# Patient Record
Sex: Male | Born: 1955 | Race: White | Hispanic: No | Marital: Married | Smoking: Former smoker
Health system: Southern US, Community
[De-identification: ages and names within clinical notes are randomized; demographics above are authoritative.]

## PROBLEM LIST (undated history)

## (undated) DIAGNOSIS — R7303 Prediabetes: Secondary | ICD-10-CM

## (undated) DIAGNOSIS — Z789 Other specified health status: Secondary | ICD-10-CM

## (undated) DIAGNOSIS — I2510 Atherosclerotic heart disease of native coronary artery without angina pectoris: Secondary | ICD-10-CM

## (undated) DIAGNOSIS — I499 Cardiac arrhythmia, unspecified: Secondary | ICD-10-CM

## (undated) DIAGNOSIS — Z9861 Coronary angioplasty status: Principal | ICD-10-CM

## (undated) DIAGNOSIS — M1990 Unspecified osteoarthritis, unspecified site: Secondary | ICD-10-CM

## (undated) DIAGNOSIS — R002 Palpitations: Secondary | ICD-10-CM

## (undated) DIAGNOSIS — K219 Gastro-esophageal reflux disease without esophagitis: Secondary | ICD-10-CM

## (undated) DIAGNOSIS — Z720 Tobacco use: Secondary | ICD-10-CM

## (undated) DIAGNOSIS — I2121 ST elevation (STEMI) myocardial infarction involving left circumflex coronary artery: Secondary | ICD-10-CM

## (undated) DIAGNOSIS — IMO0002 Reserved for concepts with insufficient information to code with codable children: Secondary | ICD-10-CM

## (undated) DIAGNOSIS — I10 Essential (primary) hypertension: Secondary | ICD-10-CM

## (undated) DIAGNOSIS — R9430 Abnormal result of cardiovascular function study, unspecified: Secondary | ICD-10-CM

## (undated) DIAGNOSIS — I82409 Acute embolism and thrombosis of unspecified deep veins of unspecified lower extremity: Secondary | ICD-10-CM

## (undated) DIAGNOSIS — I739 Peripheral vascular disease, unspecified: Secondary | ICD-10-CM

## (undated) DIAGNOSIS — I340 Nonrheumatic mitral (valve) insufficiency: Secondary | ICD-10-CM

## (undated) DIAGNOSIS — E785 Hyperlipidemia, unspecified: Secondary | ICD-10-CM

## (undated) DIAGNOSIS — F419 Anxiety disorder, unspecified: Secondary | ICD-10-CM

## (undated) HISTORY — DX: Reserved for concepts with insufficient information to code with codable children: IMO0002

## (undated) HISTORY — DX: Atherosclerotic heart disease of native coronary artery without angina pectoris: I25.10

## (undated) HISTORY — DX: Nonrheumatic mitral (valve) insufficiency: I34.0

## (undated) HISTORY — DX: Acute embolism and thrombosis of unspecified deep veins of unspecified lower extremity: I82.409

## (undated) HISTORY — DX: Gastro-esophageal reflux disease without esophagitis: K21.9

## (undated) HISTORY — DX: Tobacco use: Z72.0

## (undated) HISTORY — PX: GANGLION CYST EXCISION: SHX1691

## (undated) HISTORY — DX: Other specified health status: Z78.9

## (undated) HISTORY — DX: Essential (primary) hypertension: I10

## (undated) HISTORY — DX: Palpitations: R00.2

## (undated) HISTORY — DX: Coronary angioplasty status: Z98.61

## (undated) HISTORY — DX: Abnormal result of cardiovascular function study, unspecified: R94.30

## (undated) HISTORY — PX: HERNIA REPAIR: SHX51

---

## 2005-06-03 ENCOUNTER — Emergency Department (HOSPITAL_COMMUNITY): Admission: EM | Admit: 2005-06-03 | Discharge: 2005-06-03 | Payer: Self-pay | Admitting: Emergency Medicine

## 2005-07-05 ENCOUNTER — Ambulatory Visit: Payer: Self-pay | Admitting: Oncology

## 2005-07-10 ENCOUNTER — Ambulatory Visit (HOSPITAL_COMMUNITY): Admission: RE | Admit: 2005-07-10 | Discharge: 2005-07-10 | Payer: Self-pay | Admitting: Cardiology

## 2005-09-06 ENCOUNTER — Ambulatory Visit: Payer: Self-pay | Admitting: Oncology

## 2008-09-01 ENCOUNTER — Encounter: Payer: Self-pay | Admitting: Cardiology

## 2009-09-21 ENCOUNTER — Encounter: Payer: Self-pay | Admitting: Cardiology

## 2010-05-20 ENCOUNTER — Ambulatory Visit: Payer: Self-pay | Admitting: Psychology

## 2010-06-02 ENCOUNTER — Ambulatory Visit: Payer: Self-pay | Admitting: Psychology

## 2010-08-19 ENCOUNTER — Encounter: Payer: Self-pay | Admitting: Cardiology

## 2010-09-05 LAB — HM COLONOSCOPY

## 2011-04-21 ENCOUNTER — Encounter: Payer: Self-pay | Admitting: Cardiology

## 2011-04-21 ENCOUNTER — Ambulatory Visit (INDEPENDENT_AMBULATORY_CARE_PROVIDER_SITE_OTHER): Payer: BC Managed Care – PPO | Admitting: Cardiology

## 2011-04-21 DIAGNOSIS — K219 Gastro-esophageal reflux disease without esophagitis: Secondary | ICD-10-CM | POA: Insufficient documentation

## 2011-04-21 DIAGNOSIS — Z789 Other specified health status: Secondary | ICD-10-CM | POA: Insufficient documentation

## 2011-04-21 DIAGNOSIS — E785 Hyperlipidemia, unspecified: Secondary | ICD-10-CM | POA: Insufficient documentation

## 2011-04-21 DIAGNOSIS — Z720 Tobacco use: Secondary | ICD-10-CM

## 2011-04-21 DIAGNOSIS — I82409 Acute embolism and thrombosis of unspecified deep veins of unspecified lower extremity: Secondary | ICD-10-CM

## 2011-04-21 DIAGNOSIS — I10 Essential (primary) hypertension: Secondary | ICD-10-CM | POA: Insufficient documentation

## 2011-04-21 DIAGNOSIS — Z87891 Personal history of nicotine dependence: Secondary | ICD-10-CM | POA: Insufficient documentation

## 2011-04-21 DIAGNOSIS — I498 Other specified cardiac arrhythmias: Secondary | ICD-10-CM | POA: Insufficient documentation

## 2011-04-21 DIAGNOSIS — I499 Cardiac arrhythmia, unspecified: Secondary | ICD-10-CM

## 2011-04-21 DIAGNOSIS — Z86718 Personal history of other venous thrombosis and embolism: Secondary | ICD-10-CM | POA: Insufficient documentation

## 2011-04-21 DIAGNOSIS — F17200 Nicotine dependence, unspecified, uncomplicated: Secondary | ICD-10-CM

## 2011-04-21 MED ORDER — OMEGA-3-ACID ETHYL ESTERS 1 G PO CAPS: ORAL_CAPSULE | ORAL | Status: DC

## 2011-04-21 NOTE — Assessment & Plan Note (Signed)
Blood pressure is controlled today. No change in therapy. 

## 2011-04-21 NOTE — Assessment & Plan Note (Signed)
I counseled the patient does have smoking.

## 2011-04-21 NOTE — Progress Notes (Signed)
HPI Patient is seen today at his request to establish cardiology care.  His primary physician is Dr. Wylene Simmer.  He is very happy with his care.  I take care the patient's mother.I have reviewed outside records from care that the patient has received in [facility] in the past.  I do not have records from Dr. Wylene Simmer yet.  The patient has a history of hypertension and hyperlipidemia.  Specifically he has elevated triglycerides according to him.  He has taken prescription fish oil.  He ran out of this recently.  He did poorly with Crestor and TriCor.  He was then tried on atorvastatin and felt poorly with it.  He stopped this several months ago.  I  He has not been having chest pain or shortness of breath.  There is a history of venous thrombosis in his right upper extremity in the remote past.  The exact etiology is not clear.  This happened on 2 occasions according to the patient.  He was treated for Coumadin twice.  Hypercoagulable studies were negative although he did have a positive lupus anticoagulant by history.  He has not had any recent problems with this.  However he feels that the veins in his right arm may be slightly enlarged.  .No Known Allergies  Current Outpatient Prescriptions  Medication Sig Dispense Refill  . esomeprazole (NEXIUM) 10 MG packet Take 10 mg by mouth daily before breakfast.          History   Social History  . Marital Status: Widowed    Spouse Name: N/A    Number of Children: N/A  . Years of Education: N/A   Occupational History  . Not on file.   Social History Main Topics  . Smoking status: Current Everyday Smoker  . Smokeless tobacco: Not on file  . Alcohol Use: Not on file  . Drug Use: Not on file  . Sexually Active: Not on file   Other Topics Concern  . Not on file   Social History Narrative  . No narrative on file    No family history on file.  Past Medical History  Diagnosis Date  . Dyslipidemia     Patient mentions triglycerides  specifically  . GERD (gastroesophageal reflux disease)   . Hypertension   . DVT (deep vein thrombosis) in pregnancy     Right upper arm DVT on 2 occasions in the remote past  . Palpitations     Mild in the past  . Tobacco abuse   . Statin intolerance     Felt poorly after Lipitor, Crestor, TriCor    No past surgical history on file.  ROS  Patient denies fever, chills, headache, sweats, rash, change in vision, change in hearing, chest pain, cough, nausea vomiting, urinary symptoms.  All other systems are reviewed and are negative. PHYSICAL EXAM Patient is oriented to person time and place.  Affect is normal.  Head is atraumatic.  There is no jugular venous distention.  Lungs are clear.  Respiratory effort is Not labored.  Cardiac exam reveals an S1-S2.  No clicks or significant murmurs.  The abdomen is soft.  There is no peripheral edema.  There is no musculoskeletal deformities.  There are no skin rashes.  His right arm musculature is slightly greater than the left.  I'm not sure that there is any evidence of venous insufficiency. Filed Vitals:   04/21/11 1438  BP: 132/80  Pulse: 80  Weight: 225 lb (102.059 kg)    EKG  is done today and reviewed by me.  It is normal.  ASSESSMENT & PLAN

## 2011-04-21 NOTE — Patient Instructions (Addendum)
Your physician recommends that you schedule a follow-up appointment in: 8 WEEKS WITH DR Myrtis Ser  Your physician has recommended you make the following change in your medication: ADD ASPIRIN 81 MG 1 EVERY DAY  LOVAZA 1000 MG  4 TABS DAILY  Your physician recommends that you return for lab work in: FASTING LIPID  BEFORE STARTING LOVAZA  DX 272.4

## 2011-04-21 NOTE — Assessment & Plan Note (Signed)
The patient on a fasting lipid profile.  We will obtain older labs from his primary care physician.  I will restart him on prescription fish oil.  We will then decide how to proceed over time.

## 2011-04-21 NOTE — Assessment & Plan Note (Signed)
There is no evidence at this time deep venous thrombosis.  I'm not aware of any indication to use Coumadin for the Lupus anticoagulant only.  This will be reviewed.I do want him to take 81 mg of aspirin daily as he has risk factors for coronary disease in addition

## 2011-04-25 ENCOUNTER — Other Ambulatory Visit: Payer: Self-pay | Admitting: Cardiology

## 2011-04-25 ENCOUNTER — Other Ambulatory Visit (INDEPENDENT_AMBULATORY_CARE_PROVIDER_SITE_OTHER): Payer: BC Managed Care – PPO | Admitting: *Deleted

## 2011-04-25 DIAGNOSIS — E785 Hyperlipidemia, unspecified: Secondary | ICD-10-CM

## 2011-04-25 LAB — LIPID PANEL
Cholesterol: 218 mg/dL — ABNORMAL HIGH (ref 0–200)
HDL: 36.1 mg/dL — ABNORMAL LOW (ref >39.00)
Total CHOL/HDL Ratio: 6
Triglycerides: 193 mg/dL — ABNORMAL HIGH (ref 0.0–149.0)
VLDL: 38.6 mg/dL (ref 0.0–40.0)

## 2011-04-25 LAB — LDL CHOLESTEROL, DIRECT: Direct LDL: 127.2 mg/dL

## 2011-04-25 MED ORDER — OMEGA-3-ACID ETHYL ESTERS 1 G PO CAPS: ORAL_CAPSULE | ORAL | Status: DC

## 2011-05-09 ENCOUNTER — Telehealth: Payer: Self-pay | Admitting: Cardiology

## 2011-05-09 DIAGNOSIS — E785 Hyperlipidemia, unspecified: Secondary | ICD-10-CM

## 2011-05-09 NOTE — Telephone Encounter (Signed)
Notified of lab results. Made an app for 6 wks. To see Dr. Myrtis Ser 11/5. Will get lab prior to app.

## 2011-05-09 NOTE — Telephone Encounter (Signed)
Pt returning call from [place]. Please return pt call.

## 2011-05-18 ENCOUNTER — Telehealth: Payer: Self-pay | Admitting: Cardiology

## 2011-05-18 NOTE — Telephone Encounter (Signed)
N/A.  LMTC. 

## 2011-05-18 NOTE — Telephone Encounter (Signed)
Pt returned call.  Unable to figure out who called him.

## 2011-05-18 NOTE — Telephone Encounter (Signed)
Pt returning your call

## 2011-05-23 ENCOUNTER — Encounter: Payer: Self-pay | Admitting: Cardiology

## 2011-05-23 DIAGNOSIS — I340 Nonrheumatic mitral (valve) insufficiency: Secondary | ICD-10-CM | POA: Insufficient documentation

## 2011-05-23 DIAGNOSIS — R9430 Abnormal result of cardiovascular function study, unspecified: Secondary | ICD-10-CM | POA: Insufficient documentation

## 2011-06-05 ENCOUNTER — Other Ambulatory Visit (INDEPENDENT_AMBULATORY_CARE_PROVIDER_SITE_OTHER): Payer: BC Managed Care – PPO | Admitting: *Deleted

## 2011-06-05 ENCOUNTER — Other Ambulatory Visit: Payer: Self-pay | Admitting: Cardiology

## 2011-06-05 ENCOUNTER — Encounter: Payer: Self-pay | Admitting: Cardiology

## 2011-06-05 ENCOUNTER — Ambulatory Visit (INDEPENDENT_AMBULATORY_CARE_PROVIDER_SITE_OTHER): Payer: BC Managed Care – PPO | Admitting: Cardiology

## 2011-06-05 DIAGNOSIS — I82409 Acute embolism and thrombosis of unspecified deep veins of unspecified lower extremity: Secondary | ICD-10-CM

## 2011-06-05 DIAGNOSIS — E785 Hyperlipidemia, unspecified: Secondary | ICD-10-CM

## 2011-06-05 LAB — LIPID PANEL
Cholesterol: 234 mg/dL — ABNORMAL HIGH (ref 0–200)
HDL: 41.7 mg/dL (ref >39.00)
Total CHOL/HDL Ratio: 6
Triglycerides: 149 mg/dL (ref 0.0–149.0)
VLDL: 29.8 mg/dL (ref 0.0–40.0)

## 2011-06-05 LAB — BRAIN NATRIURETIC PEPTIDE: Pro B Natriuretic peptide (BNP): 11 pg/mL (ref 0.0–100.0)

## 2011-06-05 LAB — LDL CHOLESTEROL, DIRECT: Direct LDL: 159.1 mg/dL

## 2011-06-05 LAB — HEPATIC FUNCTION PANEL
ALT: 30 U/L (ref 0–53)
AST: 24 U/L (ref 0–37)
Albumin: 4 g/dL (ref 3.5–5.2)
Alkaline Phosphatase: 59 U/L (ref 39–117)
Bilirubin, Direct: 0 mg/dL (ref 0.0–0.3)
Total Bilirubin: 0.7 mg/dL (ref 0.3–1.2)
Total Protein: 7.3 g/dL (ref 6.0–8.3)

## 2011-06-05 NOTE — Assessment & Plan Note (Signed)
This was stable.  No diagnostic workup at this time.

## 2011-06-05 NOTE — Progress Notes (Signed)
HPI Patient is seen today for followup the overall approach to his cardiovascular care.  He establish with me in September, 2012.  I take care of other family members.  He's had problems with statins.  We put him on Lovaza, fish oil pills.  He feels good with this.  He is to have repeat lab today No Known Allergies  Current Outpatient Prescriptions  Medication Sig Dispense Refill  . aspirin 81 MG tablet Take 81 mg by mouth daily.        Marland Kitchen esomeprazole (NEXIUM) 10 MG packet Take 10 mg by mouth daily before breakfast.        . omega-3 acid ethyl esters (LOVAZA) 1 G capsule 4 GRAMS EVERY DAY  360 capsule  3    History   Social History  . Marital Status: Widowed    Spouse Name: N/A    Number of Children: N/A  . Years of Education: N/A   Occupational History  . Not on file.   Social History Main Topics  . Smoking status: Current Everyday Smoker  . Smokeless tobacco: Not on file  . Alcohol Use: Not on file  . Drug Use: Not on file  . Sexually Active: Not on file   Other Topics Concern  . Not on file   Social History Narrative  . No narrative on file    No family history on file.  Past Medical History  Diagnosis Date  . Dyslipidemia     Patient mentions triglycerides specifically  . GERD (gastroesophageal reflux disease)   . Hypertension   . DVT (deep venous thrombosis)     Right upper arm DVT on 2 occasions in the remote past  . Palpitations     Mild in the past  . Tobacco abuse   . Statin intolerance     Felt poorly after Lipitor, Crestor, TriCor  . Ejection fraction     LV function normal, echo, February, 2010  . Mitral regurgitation     Mild, echo, February, 2010    No past surgical history on file.  ROS  Patient denies fever, chills, headache, sweats, rash, change in vision, change in hearing, chest pain, cough, nausea vomiting, urinary symptoms.  All other systems are reviewed and are negative. PHYSICAL EXAM Patient is stable.  Head is atraumatic.  No  jugular venous distention.  He has mild increase in the right upper arm when compared to the left.  This is chronic.  There is no obvious swelling.  Lungs are clear.  Respiratory effort is not labored.  Cardiac exam reveals a somewhat S. Tic.  No clicks or significant murmurs.  Abdomen soft is no peripheral edema. Filed Vitals:   06/05/11 0925  BP: 142/84  Pulse: 79  Resp: 18  Height: 6\' 1"  (1.854 m)  Weight: 227 lb 6.4 oz (103.148 kg)  SpO2: 97%    EKG is not done today. ASSESSMENT & PLAN

## 2011-06-05 NOTE — Assessment & Plan Note (Signed)
We are treating him for primary prevention.  He does not tolerate statins.  I am looking forward to see his triglycerides on his Lovaza.

## 2011-06-05 NOTE — Patient Instructions (Signed)
Your physician wants you to follow-up in:  6 months. You will receive a reminder letter in the mail two months in advance. If you don't receive a letter, please call our office to schedule the follow-up appointment.   

## 2011-06-08 ENCOUNTER — Other Ambulatory Visit: Payer: Self-pay

## 2011-06-08 MED ORDER — PRAVASTATIN SODIUM 40 MG PO TABS: 20.0000 mg | ORAL_TABLET | Freq: Every evening | ORAL | Status: DC

## 2011-12-20 ENCOUNTER — Ambulatory Visit (INDEPENDENT_AMBULATORY_CARE_PROVIDER_SITE_OTHER): Payer: BC Managed Care – PPO | Admitting: Cardiology

## 2011-12-20 ENCOUNTER — Encounter: Payer: Self-pay | Admitting: Cardiology

## 2011-12-20 VITALS — BP 118/76 | HR 74 | Ht 73.0 in | Wt 226.0 lb

## 2011-12-20 DIAGNOSIS — I82409 Acute embolism and thrombosis of unspecified deep veins of unspecified lower extremity: Secondary | ICD-10-CM

## 2011-12-20 DIAGNOSIS — R002 Palpitations: Secondary | ICD-10-CM

## 2011-12-20 DIAGNOSIS — Z720 Tobacco use: Secondary | ICD-10-CM

## 2011-12-20 DIAGNOSIS — E785 Hyperlipidemia, unspecified: Secondary | ICD-10-CM

## 2011-12-20 DIAGNOSIS — Z789 Other specified health status: Secondary | ICD-10-CM

## 2011-12-20 DIAGNOSIS — F17200 Nicotine dependence, unspecified, uncomplicated: Secondary | ICD-10-CM

## 2011-12-20 DIAGNOSIS — I10 Essential (primary) hypertension: Secondary | ICD-10-CM

## 2011-12-20 LAB — LIPID PANEL
Cholesterol: 181 mg/dL (ref 0–200)
HDL: 35.3 mg/dL — ABNORMAL LOW (ref >39.00)
LDL Cholesterol: 110 mg/dL — ABNORMAL HIGH (ref 0–99)
Total CHOL/HDL Ratio: 5
Triglycerides: 180 mg/dL — ABNORMAL HIGH (ref 0.0–149.0)
VLDL: 36 mg/dL (ref 0.0–40.0)

## 2011-12-20 NOTE — Assessment & Plan Note (Signed)
He is tolerating Pravachol 20 at this time. Labs to be done today.

## 2011-12-20 NOTE — Assessment & Plan Note (Signed)
Patient is fasting today. We will obtain lipids. This will reflect him being on Pravachol 20 and off Lovaza.I will see the results and be in touch with him.

## 2011-12-20 NOTE — Assessment & Plan Note (Signed)
Blood pressure is under good control. No change in therapy 

## 2011-12-20 NOTE — Patient Instructions (Signed)
Your physician recommends that you schedule a follow-up appointment in:  6 months with Dr. Myrtis Ser. The office will mail you a reminder letter 2 months prior the appointment date. Your physician recommends that you continue on your current medications as directed. Please refer to the Current Medication list given to you today. Your physician recommends that you return for lab work in:  Today ,fasting Lipid panel.

## 2011-12-20 NOTE — Assessment & Plan Note (Signed)
Patient is counseled to stop smoking. 

## 2011-12-20 NOTE — Assessment & Plan Note (Signed)
He's not having any significant palpitations. 

## 2011-12-20 NOTE — Assessment & Plan Note (Signed)
He has had no recurrent problems in his right arm.

## 2011-12-20 NOTE — Progress Notes (Signed)
   HPI Patient is seen today for followup. He is doing well. I saw him last in November, 2012. At that time his triglycerides were 149 on Lovaza, 4 g daily. I still encouraged him to try some Pravachol. He had not tolerated other statins.  He is on Pravachol 20 and he is tolerating this. However he decided he would like to try the Pravachol and come off the low fossa if possible. Therefore at this time he is on Pravachol 20 4 his lipids. He is tolerating it well.  No Known Allergies  Current Outpatient Prescriptions  Medication Sig Dispense Refill  . aspirin 81 MG tablet Take 81 mg by mouth daily.        . CELEBREX 200 MG capsule as needed.      Marland Kitchen esomeprazole (NEXIUM) 10 MG packet Take 10 mg by mouth daily before breakfast.        . LORazepam (ATIVAN) 0.5 MG tablet as needed.      . pravastatin (PRAVACHOL) 40 MG tablet Take 0.5 tablets (20 mg total) by mouth every evening.  45 tablet  1    History   Social History  . Marital Status: Widowed    Spouse Name: N/A    Number of Children: N/A  . Years of Education: N/A   Occupational History  . Not on file.   Social History Main Topics  . Smoking status: Current Everyday Smoker  . Smokeless tobacco: Not on file  . Alcohol Use: Not on file  . Drug Use: Not on file  . Sexually Active: Not on file   Other Topics Concern  . Not on file   Social History Narrative  . No narrative on file    No family history on file.  Past Medical History  Diagnosis Date  . Dyslipidemia     Patient mentions triglycerides specifically  . GERD (gastroesophageal reflux disease)   . Hypertension   . DVT (deep venous thrombosis)     Right upper arm DVT on 2 occasions in the remote past  . Palpitations     Mild in the past  . Tobacco abuse   . Statin intolerance     Felt poorly after Lipitor, Crestor, TriCor  . Ejection fraction     LV function normal, echo, February, 2010  . Mitral regurgitation     Mild, echo, February, 2010    No past  surgical history on file.  ROS Patient denies fever, chills, headache, sweats, rash, change in vision, change in hearing, chest pain, cough, nausea vomiting, urinary symptoms. All other systems are reviewed and are negative.  PHYSICAL EXAM He is stable. There is no jugular venous distention. Lungs are clear. Respiratory effort is nonlabored. Cardiac exam reveals S1 and S2. There no clicks or significant murmurs. There is no change in the mild increased circumference of his right upper arm. Abdomen is soft. Is no peripheral edema.  Filed Vitals:   12/20/11 0935  BP: 118/76  Pulse: 74  Height: 6\' 1"  (1.854 m)  Weight: 226 lb (102.513 kg)   EKG is done today and reviewed by me. There is normal sinus rhythm. There is no significant abnormality.  ASSESSMENT & PLAN

## 2011-12-26 ENCOUNTER — Encounter: Payer: Self-pay | Admitting: Cardiology

## 2011-12-26 NOTE — Progress Notes (Unsigned)
With the patient off Lovaza and on Pravachol 20 his LDL is down to 110. This is acceptable for him at this time. We will continue this.

## 2012-09-06 ENCOUNTER — Encounter (HOSPITAL_COMMUNITY): Payer: Self-pay | Admitting: Emergency Medicine

## 2012-09-06 ENCOUNTER — Emergency Department (HOSPITAL_COMMUNITY)
Admission: EM | Admit: 2012-09-06 | Discharge: 2012-09-06 | Disposition: A | Discharge status: Home / Self Care | Payer: BC Managed Care – PPO | Attending: Emergency Medicine | Admitting: Emergency Medicine

## 2012-09-06 DIAGNOSIS — K219 Gastro-esophageal reflux disease without esophagitis: Secondary | ICD-10-CM | POA: Insufficient documentation

## 2012-09-06 DIAGNOSIS — Z79899 Other long term (current) drug therapy: Secondary | ICD-10-CM | POA: Insufficient documentation

## 2012-09-06 DIAGNOSIS — Z7982 Long term (current) use of aspirin: Secondary | ICD-10-CM | POA: Insufficient documentation

## 2012-09-06 DIAGNOSIS — K529 Noninfective gastroenteritis and colitis, unspecified: Secondary | ICD-10-CM

## 2012-09-06 DIAGNOSIS — K5289 Other specified noninfective gastroenteritis and colitis: Secondary | ICD-10-CM | POA: Insufficient documentation

## 2012-09-06 DIAGNOSIS — R197 Diarrhea, unspecified: Secondary | ICD-10-CM | POA: Insufficient documentation

## 2012-09-06 DIAGNOSIS — Z86718 Personal history of other venous thrombosis and embolism: Secondary | ICD-10-CM | POA: Insufficient documentation

## 2012-09-06 DIAGNOSIS — I10 Essential (primary) hypertension: Secondary | ICD-10-CM | POA: Insufficient documentation

## 2012-09-06 DIAGNOSIS — Z8679 Personal history of other diseases of the circulatory system: Secondary | ICD-10-CM | POA: Insufficient documentation

## 2012-09-06 DIAGNOSIS — F17200 Nicotine dependence, unspecified, uncomplicated: Secondary | ICD-10-CM | POA: Insufficient documentation

## 2012-09-06 DIAGNOSIS — E785 Hyperlipidemia, unspecified: Secondary | ICD-10-CM | POA: Insufficient documentation

## 2012-09-06 LAB — CBC WITH DIFFERENTIAL/PLATELET
Basophils Absolute: 0 10*3/uL (ref 0.0–0.1)
Basophils Relative: 0 % (ref 0–1)
Eosinophils Absolute: 0.1 10*3/uL (ref 0.0–0.7)
Eosinophils Relative: 1 % (ref 0–5)
HCT: 44.8 % (ref 39.0–52.0)
Hemoglobin: 16 g/dL (ref 13.0–17.0)
Lymphocytes Relative: 3 % — ABNORMAL LOW (ref 12–46)
Lymphs Abs: 0.5 10*3/uL — ABNORMAL LOW (ref 0.7–4.0)
MCH: 32.1 pg (ref 26.0–34.0)
MCHC: 35.7 g/dL (ref 30.0–36.0)
MCV: 89.8 fL (ref 78.0–100.0)
Monocytes Absolute: 0.6 10*3/uL (ref 0.1–1.0)
Monocytes Relative: 5 % (ref 3–12)
Neutro Abs: 13.1 10*3/uL — ABNORMAL HIGH (ref 1.7–7.7)
Neutrophils Relative %: 92 % — ABNORMAL HIGH (ref 43–77)
Platelets: 181 10*3/uL (ref 150–400)
RBC: 4.99 MIL/uL (ref 4.22–5.81)
RDW: 12.9 % (ref 11.5–15.5)
WBC: 14.3 10*3/uL — ABNORMAL HIGH (ref 4.0–10.5)

## 2012-09-06 LAB — COMPREHENSIVE METABOLIC PANEL
ALT: 26 U/L (ref 0–53)
AST: 22 U/L (ref 0–37)
Albumin: 3.7 g/dL (ref 3.5–5.2)
Alkaline Phosphatase: 68 U/L (ref 39–117)
BUN: 21 mg/dL (ref 6–23)
CO2: 23 mEq/L (ref 19–32)
Calcium: 8.3 mg/dL — ABNORMAL LOW (ref 8.4–10.5)
Chloride: 103 mEq/L (ref 96–112)
Creatinine, Ser: 0.95 mg/dL (ref 0.50–1.35)
GFR calc Af Amer: >90 mL/min (ref >90)
GFR calc non Af Amer: >90 mL/min (ref >90)
Glucose, Bld: 155 mg/dL — ABNORMAL HIGH (ref 70–99)
Potassium: 4.1 mEq/L (ref 3.5–5.1)
Sodium: 138 mEq/L (ref 135–145)
Total Bilirubin: 0.7 mg/dL (ref 0.3–1.2)
Total Protein: 7 g/dL (ref 6.0–8.3)

## 2012-09-06 LAB — URINALYSIS, ROUTINE W REFLEX MICROSCOPIC
Bilirubin Urine: NEGATIVE
Glucose, UA: NEGATIVE mg/dL
Hgb urine dipstick: NEGATIVE
Leukocytes, UA: NEGATIVE
Nitrite: NEGATIVE
Protein, ur: NEGATIVE mg/dL
Specific Gravity, Urine: 1.026 (ref 1.005–1.030)
Urobilinogen, UA: 0.2 mg/dL (ref 0.0–1.0)
pH: 5 (ref 5.0–8.0)

## 2012-09-06 LAB — LIPASE, BLOOD: Lipase: 24 U/L (ref 11–59)

## 2012-09-06 MED ORDER — GI COCKTAIL ~~LOC~~
30.0000 mL | Freq: Once | ORAL | Status: AC
  Administered 2012-09-06: 30 mL via ORAL
  Filled 2012-09-06: qty 30

## 2012-09-06 MED ORDER — SODIUM CHLORIDE 0.9 % IV BOLUS (SEPSIS)
1000.0000 mL | Freq: Once | INTRAVENOUS | Status: AC
  Administered 2012-09-06: 1000 mL via INTRAVENOUS

## 2012-09-06 MED ORDER — GI COCKTAIL ~~LOC~~: 30.0000 mL | Freq: Once | ORAL | Status: DC

## 2012-09-06 MED ORDER — ONDANSETRON HCL 4 MG PO TABS: 4.0000 mg | ORAL_TABLET | Freq: Four times a day (QID) | ORAL | Status: DC

## 2012-09-06 MED ORDER — METOCLOPRAMIDE HCL 5 MG/ML IJ SOLN
10.0000 mg | Freq: Once | INTRAMUSCULAR | Status: AC
  Administered 2012-09-06: 10 mg via INTRAVENOUS
  Filled 2012-09-06: qty 2

## 2012-09-06 MED ORDER — ONDANSETRON HCL 4 MG/2ML IJ SOLN
4.0000 mg | Freq: Once | INTRAMUSCULAR | Status: AC
  Administered 2012-09-06: 4 mg via INTRAVENOUS
  Filled 2012-09-06: qty 2

## 2012-09-06 NOTE — ED Notes (Signed)
Pt still getting bolus.

## 2012-09-06 NOTE — ED Notes (Signed)
Report given via EMS. Pt c/o v/d since 1930. Pt unable to keep anything down, felt dehydrated. Positive for orthostatic changes. 18 gauge Left AC. 200 ml given en route. AAOx4. Pt felt dizzy. No chest pain, no SOB. In and out of hospitals visiting family recently. Initial VS 142/78 Pulse 104 RR 14 at 0009. Orthostatic 147/96 HR 109 sitting, 126/91 HR 121 standing. Pt took zofran before EMS arrival, able to keep it down. NKA. Pt taking nexium, simvastatin.

## 2012-09-06 NOTE — ED Notes (Signed)
Pt still too nauseous for PO Tylenol (temp 100.41F) and has been having diarrhea. Unsure if pt will be able to keep in rectal tylenol.

## 2012-09-06 NOTE — ED Notes (Signed)
ION:GE95<MW> Expected date:<BR> Expected time:<BR> Means of arrival:<BR> Comments:<BR> EMS/vomiting and diarrhea since 1930-positive orthostatic changes/IV established

## 2012-09-06 NOTE — ED Notes (Signed)
Pt has been ambulatory and walking to and from the bathroom. Pt has had multiple BMs and has urinated multiple times. Pt reports feeling better at the end. No emesis, but nausea noted. Nausea resolved with the Reglan.

## 2012-09-06 NOTE — ED Provider Notes (Signed)
History     CSN: [id number]  Arrival date & time 09/06/12  0042   First MD Initiated Contact with Patient 09/06/12 0225      Chief Complaint  Patient presents with  . Emesis  . 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 He's concerned that he was becoming dehydrated and called EMS to be brought to the emergency department. He says that he has not been able to keep anything down by mouth. He takes Nexium for his GERD and can feel some of the acid in the back of his throat. While in the ED the patient continues to run to the bathroom and back for episodes of diarrhea. He denies having any abdominal pains but does admit to diffuse cramping right before the episode of diarrhea. EMS noted him to be orthostatic but here in the emergency department his orthostatics are negative patient denies having any pain anywhere or feeling weak. Has not had any fevers. Has not been on any antibiotics recently.  Past Medical History  Diagnosis Date  . Dyslipidemia     Patient mentions triglycerides specifically  . GERD (gastroesophageal reflux disease)   . Hypertension   . DVT (deep venous thrombosis)     Right upper arm DVT on 2 occasions in the remote past  . Palpitations     Mild in the past  . Tobacco abuse   . Statin intolerance     Felt poorly after Lipitor, Crestor, TriCor  . Ejection fraction     LV function normal, echo, February, 2010  . Mitral regurgitation     Mild, echo, February, 2010    History reviewed. No pertinent past surgical history.  No family history on file.  History  Substance Use Topics  . Smoking status: Current Every Day Smoker  . Smokeless tobacco: Not on file  . Alcohol Use: Not on file      Review of Systems  Review of  Systems  Gen: no weight loss, fevers, chills, night sweats  Eyes: no discharge or drainage, no occular pain or visual changes  Nose: no epistaxis or rhinorrhea  Mouth: no dental pain, no sore throat  Neck: no neck pain  Lungs:No wheezing, coughing or hemoptysis CV: no chest pain, palpitations, dependent edema or orthopnea  Abd: no abdominal pain, + nausea, vomiting and diarrhea GU: no dysuria or gross hematuria  MSK:  No abnormalities  Neuro: no headache, no focal neurologic deficits  Skin: no abnormalities Psyche: negative.   Allergies  Review of patient's allergies indicates no known allergies.  Home Medications   Current Outpatient Rx  Name  Route  Sig  Dispense  Refill  . ASPIRIN 81 MG PO TABS   Oral   Take 81 mg by mouth every morning.          Marland Kitchen ESOMEPRAZOLE MAGNESIUM 40 MG PO CPDR   Oral   Take 40 mg by mouth daily before breakfast.         . IBUPROFEN 200 MG PO TABS   Oral   Take 800 mg by mouth every 6 (six) hours as needed. For pain         . ONDANSETRON 8 MG PO TBDP   Oral   Take 8 mg by mouth every 8 (eight) hours as needed. For nausea or vomiting         .  SIMVASTATIN 40 MG PO TABS   Oral   Take 40 mg by mouth every evening.         Marland Kitchen PRAVASTATIN SODIUM 40 MG PO TABS   Oral   Take 0.5 tablets (20 mg total) by mouth every evening.   45 tablet   1     BP 129/80  Pulse 112  Temp 100.6 F (38.1 C) (Oral)  Resp 20  SpO2 99%  Physical Exam  Nursing note and vitals reviewed. Constitutional: He appears well-developed and well-nourished. No distress.  HENT:  Head: Normocephalic and atraumatic.  Eyes: Pupils are equal, round, and reactive to light.  Neck: Normal range of motion. Neck supple.  Cardiovascular: Normal rate and regular rhythm.   Pulmonary/Chest: Effort normal.  Abdominal: Soft. He exhibits no distension and no mass. There is no tenderness. There is no rebound and no guarding.       + hyperactive bowel sounds  Neurological:  He is alert.  Skin: Skin is warm and dry.    ED Course  Procedures (including critical care time)  Labs Reviewed  CBC WITH DIFFERENTIAL - Abnormal; Notable for the following:    WBC 14.3 (*)     Neutrophils Relative 92 (*)     Neutro Abs 13.1 (*)     Lymphocytes Relative 3 (*)     Lymphs Abs 0.5 (*)     All other components within normal limits  COMPREHENSIVE METABOLIC PANEL - Abnormal; Notable for the following:    Glucose, Bld 155 (*)     Calcium 8.3 (*)     All other components within normal limits  URINALYSIS, ROUTINE W REFLEX MICROSCOPIC - Abnormal; Notable for the following:    Ketones, ur TRACE (*)     All other components within normal limits  LIPASE, BLOOD  STOOL CULTURE   No results found.   No diagnosis found.  Dx: gastroenteritis  MDM  Pt given  2 L of fluids in ED, reglan and Gi cocktail.  4:50am- Re-evalauted pt He has not had the urge or a diarrhea episode in an hour and a half. No longer nauseous and no longer having acid reflux symptoms. He admits that he feels much better.  Will give rx for Zofran   Pt informed to drink a lot of fluids. Strict return to ED precautions given. His lab results are not concerning to any renal insuffiencey. He says he feels much better and is comfortable going home.  Pt has been advised of the symptoms that warrant their return to the ED. Patient has voiced understanding and has agreed to follow-up with the PCP or specialist.          Dorthula Matas, PA 09/06/12 [phone]

## 2012-09-06 NOTE — ED Notes (Signed)
Pt ambulated to bathroom 

## 2012-09-06 NOTE — ED Provider Notes (Signed)
Medical screening examination/treatment/procedure(s) were performed by non-physician practitioner and as supervising physician I was immediately available for consultation/collaboration.  John-Adam Qiara Minetti, M.D.     John-Adam Brisha Mccabe, MD 09/06/12 0717 

## 2012-11-18 ENCOUNTER — Encounter: Payer: Self-pay | Admitting: Cardiology

## 2013-07-31 DIAGNOSIS — Z87891 Personal history of nicotine dependence: Secondary | ICD-10-CM

## 2013-07-31 HISTORY — DX: Personal history of nicotine dependence: Z87.891

## 2015-10-28 LAB — IFOBT (OCCULT BLOOD): IFOBT: NEGATIVE

## 2016-04-04 ENCOUNTER — Ambulatory Visit: Payer: Self-pay | Admitting: General Surgery

## 2016-04-04 NOTE — H&P (Signed)
Terry Wright 04/04/2016 2:32 PM Location: [facility] Patient #: [id number] DOB: 1955-10-25 Married / Language: English / Race: White Male  History of Present Illness Odis Hollingshead MD; 04/04/2016 3:12 PM) The patient is a 60 year old male.   Note:He is referred by Dr. Burney Gauze for consultation requiring an intermittently symptomatic umbilical hernia. He was seeing Dr. Burney Gauze because of a growth on the volar aspect of his left wrist. Dr. Burney Gauze plans on removing that. He told Dr. Burney Gauze about the umbilical hernia and he's been sent over here for further evaluation and recommendations for treatment. He used to be a Product manager. He says he has some intermittent pain at times but no obstructive symptoms. No difficulty with urination. No chronic constipation. He is retired. He doesn't do a lot of heavy lifting any more.  Other Problems Marjean Donna, CMA; 04/04/2016 2:32 PM) Anxiety Disorder Gastroesophageal Reflux Disease Hypercholesterolemia Umbilical Hernia Repair  Past Surgical History Marjean Donna, CMA; 04/04/2016 2:32 PM) Appendectomy Colon Polyp Removal - Colonoscopy Colon Polyp Removal - Open Open Inguinal Hernia Surgery Left.  Diagnostic Studies History Marjean Donna, CMA; 04/04/2016 2:32 PM) Colonoscopy 1-5 years ago  Allergies Davy Pique Bynum, [place]; 04/04/2016 2:33 PM) No Known Drug Allergies 04/04/2016  Medication History (Sonya Bynum, CMA; 04/04/2016 2:33 PM) LORazepam (0.5MG  Tablet, Oral) Active. Pravastatin Sodium (40MG  Tablet, Oral) Active. Omeprazole (40MG  Capsule DR, Oral) Active. Medications Reconciled  Social History ([place]; 04/04/2016 2:32 PM) Alcohol use Occasional alcohol use. Caffeine use Carbonated beverages, Coffee. No drug use Tobacco use Former smoker.  Family History Marjean Donna, [address]; 04/04/2016 2:32 PM) Arthritis Father, Mother. Hypertension Mother.     Review of Systems ([place];  04/04/2016 2:32 PM) General Not Present- Appetite Loss, Chills, Fatigue, Fever, Night Sweats, Weight Gain and Weight Loss. Skin Not Present- Change in Wart/Mole, Dryness, Hives, Jaundice, New Lesions, Non-Healing Wounds, Rash and Ulcer. HEENT Not Present- Earache, Hearing Loss, Hoarseness, Nose Bleed, Oral Ulcers, Ringing in the Ears, Seasonal Allergies, Sinus Pain, Sore Throat, Visual Disturbances, Wears glasses/contact lenses and Yellow Eyes. Respiratory Not Present- Bloody sputum, Chronic Cough, Difficulty Breathing, Snoring and Wheezing. Breast Not Present- Breast Mass, Breast Pain, Nipple Discharge and Skin Changes. Cardiovascular Not Present- Chest Pain, Difficulty Breathing Lying Down, Leg Cramps, Palpitations, Rapid Heart Rate, Shortness of Breath and Swelling of Extremities. Gastrointestinal Not Present- Abdominal Pain, Bloating, Bloody Stool, Change in Bowel Habits, Chronic diarrhea, Constipation, Difficulty Swallowing, Excessive gas, Gets full quickly at meals, Hemorrhoids, Indigestion, Nausea, Rectal Pain and Vomiting. Male Genitourinary Not Present- Blood in Urine, Change in Urinary Stream, Frequency, Impotence, Nocturia, Painful Urination, Urgency and Urine Leakage. Musculoskeletal Not Present- Back Pain, Joint Pain, Joint Stiffness, Muscle Pain, Muscle Weakness and Swelling of Extremities. Neurological Not Present- Decreased Memory, Fainting, Headaches, Numbness, Seizures, Tingling, Tremor, Trouble walking and Weakness. Psychiatric Not Present- Anxiety, Bipolar, Change in Sleep Pattern, Depression, Fearful and Frequent crying. Hematology Not Present- Blood Thinners, Easy Bruising, Excessive bleeding, Gland problems, HIV and Persistent Infections.  Vitals (Sonya Bynum CMA; 04/04/2016 2:33 PM) 04/04/2016 2:33 PM Weight: 222 lb Height: 74in Body Surface Area: 2.27 m Body Mass Index: 28.5 kg/m  Temp.: 89F(Temporal)  Pulse: 79 (Regular)  BP: 140/86 (Sitting, Left Arm,  Standard)  Physical Exam Odis Hollingshead MD; 04/04/2016 3:15 PM)  The physical exam findings are as follows: Note:General: WDWN in NAD. Pleasant and cooperative.  HEENT: [place]/AT, no external nasal or ear masses, mucous membranes are moist  EYES: no scleral icterus  CV: RRR,  no murmur, no edema  CHEST: Breath sounds equal and clear. Respirations nonlabored.  ABDOMEN: Soft, RLQ scar, tender reducible umbilical bulge  GU: Left groin scar  MUSCULOSKELETAL: soft tissue mass volar aspect of left wrist  NEUROLOGIC: Alert and oriented, answers questions appropriately.  PSYCHIATRIC: Normal mood, affect , and behavior.    Assessment & Plan Odis Hollingshead MD; [phone] [id number] PM)  UMBILICAL HERNIA WITHOUT OBSTRUCTION OR GANGRENE (K42.9) Impression: This is intermittently symptomatic. It is reducible.  Plan: Umbilical hernia repair mesh. We will coordinate this with left hand surgery by Dr. Burney Gauze. I have discussed the procedure, risks, and aftercare. Risks include but are not limited to bleeding, infection, wound healing problems, anesthesia, recurrence, injury to intra-abdominal organs, cosmetic deformity. He seems to understand and would like to proceed.  Jackolyn Confer, MD

## 2016-07-31 DIAGNOSIS — J449 Chronic obstructive pulmonary disease, unspecified: Secondary | ICD-10-CM

## 2016-07-31 DIAGNOSIS — I209 Angina pectoris, unspecified: Secondary | ICD-10-CM

## 2016-07-31 DIAGNOSIS — I2510 Atherosclerotic heart disease of native coronary artery without angina pectoris: Secondary | ICD-10-CM

## 2016-07-31 HISTORY — DX: Atherosclerotic heart disease of native coronary artery without angina pectoris: I25.10

## 2016-07-31 HISTORY — DX: Chronic obstructive pulmonary disease, unspecified: J44.9

## 2016-07-31 HISTORY — DX: Angina pectoris, unspecified: I20.9

## 2016-08-03 DIAGNOSIS — K219 Gastro-esophageal reflux disease without esophagitis: Secondary | ICD-10-CM | POA: Diagnosis not present

## 2016-08-04 ENCOUNTER — Encounter (HOSPITAL_COMMUNITY)
Admission: EM | Disposition: A | Discharge status: Home / Self Care | Payer: Self-pay | Source: Home / Self Care | Attending: Cardiology

## 2016-08-04 ENCOUNTER — Inpatient Hospital Stay (HOSPITAL_COMMUNITY)
Admission: EM | Admit: 2016-08-04 | Discharge: 2016-08-06 | DRG: 246 | Disposition: A | Discharge status: Home / Self Care | Payer: Commercial Managed Care - HMO | Attending: Cardiology | Admitting: Cardiology

## 2016-08-04 ENCOUNTER — Encounter (HOSPITAL_COMMUNITY): Payer: Self-pay

## 2016-08-04 DIAGNOSIS — I2121 ST elevation (STEMI) myocardial infarction involving left circumflex coronary artery: Secondary | ICD-10-CM | POA: Insufficient documentation

## 2016-08-04 DIAGNOSIS — I340 Nonrheumatic mitral (valve) insufficiency: Secondary | ICD-10-CM | POA: Diagnosis present

## 2016-08-04 DIAGNOSIS — I493 Ventricular premature depolarization: Secondary | ICD-10-CM | POA: Diagnosis not present

## 2016-08-04 DIAGNOSIS — I472 Ventricular tachycardia: Secondary | ICD-10-CM | POA: Diagnosis not present

## 2016-08-04 DIAGNOSIS — Z86718 Personal history of other venous thrombosis and embolism: Secondary | ICD-10-CM | POA: Diagnosis not present

## 2016-08-04 DIAGNOSIS — I462 Cardiac arrest due to underlying cardiac condition: Secondary | ICD-10-CM | POA: Diagnosis not present

## 2016-08-04 DIAGNOSIS — I10 Essential (primary) hypertension: Secondary | ICD-10-CM | POA: Diagnosis present

## 2016-08-04 DIAGNOSIS — I2129 ST elevation (STEMI) myocardial infarction involving other sites: Principal | ICD-10-CM | POA: Diagnosis present

## 2016-08-04 DIAGNOSIS — E785 Hyperlipidemia, unspecified: Secondary | ICD-10-CM | POA: Diagnosis present

## 2016-08-04 DIAGNOSIS — I2510 Atherosclerotic heart disease of native coronary artery without angina pectoris: Secondary | ICD-10-CM

## 2016-08-04 DIAGNOSIS — I4901 Ventricular fibrillation: Secondary | ICD-10-CM | POA: Diagnosis not present

## 2016-08-04 DIAGNOSIS — I469 Cardiac arrest, cause unspecified: Secondary | ICD-10-CM | POA: Diagnosis not present

## 2016-08-04 DIAGNOSIS — K219 Gastro-esophageal reflux disease without esophagitis: Secondary | ICD-10-CM | POA: Diagnosis present

## 2016-08-04 DIAGNOSIS — I499 Cardiac arrhythmia, unspecified: Secondary | ICD-10-CM

## 2016-08-04 DIAGNOSIS — I252 Old myocardial infarction: Secondary | ICD-10-CM

## 2016-08-04 DIAGNOSIS — R079 Chest pain, unspecified: Secondary | ICD-10-CM | POA: Diagnosis not present

## 2016-08-04 DIAGNOSIS — I498 Other specified cardiac arrhythmias: Secondary | ICD-10-CM | POA: Diagnosis present

## 2016-08-04 DIAGNOSIS — I229 Subsequent ST elevation (STEMI) myocardial infarction of unspecified site: Secondary | ICD-10-CM | POA: Diagnosis not present

## 2016-08-04 DIAGNOSIS — I468 Cardiac arrest due to other underlying condition: Secondary | ICD-10-CM | POA: Diagnosis not present

## 2016-08-04 DIAGNOSIS — Z87891 Personal history of nicotine dependence: Secondary | ICD-10-CM | POA: Diagnosis present

## 2016-08-04 DIAGNOSIS — Z9861 Coronary angioplasty status: Secondary | ICD-10-CM

## 2016-08-04 DIAGNOSIS — I213 ST elevation (STEMI) myocardial infarction of unspecified site: Secondary | ICD-10-CM

## 2016-08-04 HISTORY — DX: ST elevation (STEMI) myocardial infarction involving left circumflex coronary artery: I21.21

## 2016-08-04 HISTORY — DX: Atherosclerotic heart disease of native coronary artery without angina pectoris: Z98.61

## 2016-08-04 HISTORY — PX: CARDIAC CATHETERIZATION: SHX172

## 2016-08-04 HISTORY — DX: Atherosclerotic heart disease of native coronary artery without angina pectoris: I25.10

## 2016-08-04 LAB — CBC WITH DIFFERENTIAL/PLATELET
Basophils Absolute: 0.1 10*3/uL (ref 0.0–0.1)
Basophils Relative: 1 %
Eosinophils Absolute: 0.2 10*3/uL (ref 0.0–0.7)
Eosinophils Relative: 2 %
HCT: 50.4 % (ref 39.0–52.0)
Hemoglobin: 17.9 g/dL — ABNORMAL HIGH (ref 13.0–17.0)
Lymphocytes Relative: 40 %
Lymphs Abs: 3.9 10*3/uL (ref 0.7–4.0)
MCH: 32.1 pg (ref 26.0–34.0)
MCHC: 35.5 g/dL (ref 30.0–36.0)
MCV: 90.3 fL (ref 78.0–100.0)
Monocytes Absolute: 0.7 10*3/uL (ref 0.1–1.0)
Monocytes Relative: 8 %
Neutro Abs: 5 10*3/uL (ref 1.7–7.7)
Neutrophils Relative %: 51 %
Platelets: 174 10*3/uL (ref 150–400)
RBC: 5.58 MIL/uL (ref 4.22–5.81)
RDW: 13.3 % (ref 11.5–15.5)
WBC: 9.9 10*3/uL (ref 4.0–10.5)

## 2016-08-04 LAB — COMPREHENSIVE METABOLIC PANEL
ALT: 51 U/L (ref 17–63)
AST: 113 U/L — ABNORMAL HIGH (ref 15–41)
Albumin: 3.6 g/dL (ref 3.5–5.0)
Alkaline Phosphatase: 52 U/L (ref 38–126)
Anion gap: 7 (ref 5–15)
BUN: 14 mg/dL (ref 6–20)
CO2: 22 mmol/L (ref 22–32)
Calcium: 8.6 mg/dL — ABNORMAL LOW (ref 8.9–10.3)
Chloride: 105 mmol/L (ref 101–111)
Creatinine, Ser: 0.8 mg/dL (ref 0.61–1.24)
GFR calc Af Amer: >60 mL/min (ref >60)
GFR calc non Af Amer: >60 mL/min (ref >60)
Glucose, Bld: 164 mg/dL — ABNORMAL HIGH (ref 65–99)
Potassium: 3.8 mmol/L (ref 3.5–5.1)
Sodium: 134 mmol/L — ABNORMAL LOW (ref 135–145)
Total Bilirubin: 0.7 mg/dL (ref 0.3–1.2)
Total Protein: 6.2 g/dL — ABNORMAL LOW (ref 6.5–8.1)

## 2016-08-04 LAB — POCT I-STAT, CHEM 8
BUN: 26 mg/dL — ABNORMAL HIGH (ref 6–20)
Calcium, Ion: 1.09 mmol/L — ABNORMAL LOW (ref 1.15–1.40)
Chloride: 103 mmol/L (ref 101–111)
Creatinine, Ser: 1.1 mg/dL (ref 0.61–1.24)
Glucose, Bld: 154 mg/dL — ABNORMAL HIGH (ref 65–99)
HCT: 54 % — ABNORMAL HIGH (ref 39.0–52.0)
Hemoglobin: 18.4 g/dL — ABNORMAL HIGH (ref 13.0–17.0)
Potassium: 4.7 mmol/L (ref 3.5–5.1)
Sodium: 137 mmol/L (ref 135–145)
TCO2: 28 mmol/L (ref 0–100)

## 2016-08-04 LAB — POCT I-STAT TROPONIN I: Troponin i, poc: 0.5 ng/mL (ref 0.00–0.08)

## 2016-08-04 LAB — POCT ACTIVATED CLOTTING TIME: Activated Clotting Time: 511 seconds

## 2016-08-04 LAB — TROPONIN I
Troponin I: 6.84 ng/mL (ref <0.03)
Troponin I: >65 ng/mL (ref <0.03)

## 2016-08-04 LAB — MRSA PCR SCREENING: MRSA by PCR: NEGATIVE

## 2016-08-04 LAB — PROTIME-INR
INR: 0.99
Prothrombin Time: 13.1 seconds (ref 11.4–15.2)

## 2016-08-04 LAB — I-STAT CG4 LACTIC ACID, ED: Lactic Acid, Venous: 2.83 mmol/L (ref 0.5–1.9)

## 2016-08-04 SURGERY — LEFT HEART CATH AND CORONARY ANGIOGRAPHY
Anesthesia: LOCAL

## 2016-08-04 MED ORDER — SODIUM CHLORIDE 0.9 % IV SOLN: 250.0000 mL | INTRAVENOUS | Status: DC | PRN

## 2016-08-04 MED ORDER — VERAPAMIL HCL 2.5 MG/ML IV SOLN
INTRAVENOUS | Status: AC
  Filled 2016-08-04: qty 2

## 2016-08-04 MED ORDER — MIDAZOLAM HCL 2 MG/2ML IJ SOLN
INTRAMUSCULAR | Status: AC
  Filled 2016-08-04: qty 2

## 2016-08-04 MED ORDER — CARVEDILOL 6.25 MG PO TABS
6.2500 mg | ORAL_TABLET | Freq: Two times a day (BID) | ORAL | Status: DC
  Administered 2016-08-05 – 2016-08-06 (×3): 6.25 mg via ORAL
  Filled 2016-08-04 (×3): qty 1

## 2016-08-04 MED ORDER — FENTANYL CITRATE (PF) 100 MCG/2ML IJ SOLN
INTRAMUSCULAR | Status: DC | PRN
  Administered 2016-08-04: 50 ug via INTRAVENOUS

## 2016-08-04 MED ORDER — CARVEDILOL 3.125 MG PO TABS
3.1250 mg | ORAL_TABLET | Freq: Once | ORAL | Status: AC
  Administered 2016-08-04: 3.125 mg via ORAL
  Filled 2016-08-04: qty 1

## 2016-08-04 MED ORDER — IOPAMIDOL (ISOVUE-370) INJECTION 76%
INTRAVENOUS | Status: AC
  Filled 2016-08-04: qty 125

## 2016-08-04 MED ORDER — MIDAZOLAM HCL 2 MG/2ML IJ SOLN
INTRAMUSCULAR | Status: DC | PRN
  Administered 2016-08-04: 2 mg via INTRAVENOUS

## 2016-08-04 MED ORDER — HEPARIN (PORCINE) IN NACL 2-0.9 UNIT/ML-% IJ SOLN
INTRAMUSCULAR | Status: DC | PRN
  Administered 2016-08-04: 1000 mL

## 2016-08-04 MED ORDER — PANTOPRAZOLE SODIUM 40 MG PO TBEC
40.0000 mg | DELAYED_RELEASE_TABLET | Freq: Every day | ORAL | Status: DC
Start: 2016-08-04 — End: 2016-08-06
  Administered 2016-08-04 – 2016-08-06 (×3): 40 mg via ORAL
  Filled 2016-08-04 (×3): qty 1

## 2016-08-04 MED ORDER — ASPIRIN 81 MG PO CHEW
81.0000 mg | CHEWABLE_TABLET | Freq: Every day | ORAL | Status: DC
  Administered 2016-08-05 – 2016-08-06 (×2): 81 mg via ORAL
  Filled 2016-08-04 (×2): qty 1

## 2016-08-04 MED ORDER — HEPARIN (PORCINE) IN NACL 2-0.9 UNIT/ML-% IJ SOLN
INTRAMUSCULAR | Status: AC
  Filled 2016-08-04: qty 1000

## 2016-08-04 MED ORDER — CARVEDILOL 3.125 MG PO TABS
3.1250 mg | ORAL_TABLET | Freq: Two times a day (BID) | ORAL | Status: DC
  Administered 2016-08-04: 3.125 mg via ORAL
  Filled 2016-08-04: qty 1

## 2016-08-04 MED ORDER — BIVALIRUDIN 250 MG IV SOLR
INTRAVENOUS | Status: AC
  Filled 2016-08-04: qty 250

## 2016-08-04 MED ORDER — LIDOCAINE HCL (PF) 1 % IJ SOLN
INTRAMUSCULAR | Status: AC
  Filled 2016-08-04: qty 30

## 2016-08-04 MED ORDER — HYDRALAZINE HCL 20 MG/ML IJ SOLN: 5.0000 mg | INTRAMUSCULAR | Status: AC | PRN

## 2016-08-04 MED ORDER — LORAZEPAM 0.5 MG PO TABS
0.5000 mg | ORAL_TABLET | Freq: Four times a day (QID) | ORAL | Status: DC | PRN
  Administered 2016-08-04 – 2016-08-05 (×3): 0.5 mg via ORAL
  Filled 2016-08-04 (×3): qty 1

## 2016-08-04 MED ORDER — BIVALIRUDIN BOLUS VIA INFUSION - CUPID
INTRAVENOUS | Status: DC | PRN
  Administered 2016-08-04: 74.85 mg via INTRAVENOUS

## 2016-08-04 MED ORDER — LIDOCAINE HCL (PF) 1 % IJ SOLN
INTRAMUSCULAR | Status: DC | PRN
  Administered 2016-08-04: 2 mL

## 2016-08-04 MED ORDER — LABETALOL HCL 5 MG/ML IV SOLN: 10.0000 mg | INTRAVENOUS | Status: AC | PRN

## 2016-08-04 MED ORDER — TICAGRELOR 90 MG PO TABS
90.0000 mg | ORAL_TABLET | Freq: Two times a day (BID) | ORAL | Status: DC
  Administered 2016-08-04 – 2016-08-06 (×4): 90 mg via ORAL
  Filled 2016-08-04 (×4): qty 1

## 2016-08-04 MED ORDER — SODIUM CHLORIDE 0.9% FLUSH: 3.0000 mL | INTRAVENOUS | Status: DC | PRN

## 2016-08-04 MED ORDER — FENTANYL CITRATE (PF) 100 MCG/2ML IJ SOLN
INTRAMUSCULAR | Status: AC
  Filled 2016-08-04: qty 2

## 2016-08-04 MED ORDER — ATORVASTATIN CALCIUM 80 MG PO TABS: 80.0000 mg | ORAL_TABLET | Freq: Every day | ORAL | Status: DC

## 2016-08-04 MED ORDER — PRAVASTATIN SODIUM 40 MG PO TABS
80.0000 mg | ORAL_TABLET | Freq: Every day | ORAL | Status: DC
  Administered 2016-08-04 – 2016-08-05 (×2): 80 mg via ORAL
  Filled 2016-08-04 (×2): qty 2

## 2016-08-04 MED ORDER — LORAZEPAM 2 MG/ML IJ SOLN
0.5000 mg | Freq: Once | INTRAMUSCULAR | Status: AC
  Administered 2016-08-04: 0.5 mg via INTRAVENOUS

## 2016-08-04 MED ORDER — ONDANSETRON HCL 4 MG/2ML IJ SOLN
4.0000 mg | Freq: Four times a day (QID) | INTRAMUSCULAR | Status: DC | PRN
  Administered 2016-08-04 – 2016-08-06 (×2): 4 mg via INTRAVENOUS
  Filled 2016-08-04 (×2): qty 2

## 2016-08-04 MED ORDER — SODIUM CHLORIDE 0.9 % WEIGHT BASED INFUSION
1.0000 mL/kg/h | INTRAVENOUS | Status: AC
  Administered 2016-08-04: 1 mL/kg/h via INTRAVENOUS

## 2016-08-04 MED ORDER — ACETAMINOPHEN 325 MG PO TABS
650.0000 mg | ORAL_TABLET | ORAL | Status: DC | PRN
  Administered 2016-08-04 – 2016-08-05 (×3): 650 mg via ORAL
  Filled 2016-08-04 (×3): qty 2

## 2016-08-04 MED ORDER — TICAGRELOR 90 MG PO TABS
ORAL_TABLET | ORAL | Status: AC
  Filled 2016-08-04: qty 2

## 2016-08-04 MED ORDER — SODIUM CHLORIDE 0.9 % IV SOLN
INTRAVENOUS | Status: DC | PRN
  Administered 2016-08-04: 1.75 mg/kg/h via INTRAVENOUS

## 2016-08-04 MED ORDER — HEPARIN (PORCINE) IN NACL 2-0.9 UNIT/ML-% IJ SOLN
INTRAMUSCULAR | Status: DC | PRN
  Administered 2016-08-04: 10 mL via INTRA_ARTERIAL

## 2016-08-04 MED ORDER — LORAZEPAM 2 MG/ML IJ SOLN
INTRAMUSCULAR | Status: AC
  Filled 2016-08-04: qty 1

## 2016-08-04 MED ORDER — LORAZEPAM 1 MG PO TABS: 1.0000 mg | ORAL_TABLET | Freq: Four times a day (QID) | ORAL | Status: DC | PRN

## 2016-08-04 MED ORDER — SODIUM CHLORIDE 0.9% FLUSH
3.0000 mL | Freq: Two times a day (BID) | INTRAVENOUS | Status: DC
  Administered 2016-08-04 – 2016-08-06 (×3): 3 mL via INTRAVENOUS

## 2016-08-04 MED ORDER — TICAGRELOR 90 MG PO TABS
ORAL_TABLET | ORAL | Status: DC | PRN
  Administered 2016-08-04: 180 mg via ORAL

## 2016-08-04 SURGICAL SUPPLY — 21 items
BALLN EMERGE MR 2.5X12 (BALLOONS) ×2
BALLN ~~LOC~~ EUPHORA RX 4.5X12 (BALLOONS) ×2
BALLOON EMERGE MR 2.5X12 (BALLOONS) IMPLANT
BALLOON ~~LOC~~ EUPHORA RX 4.5X12 (BALLOONS) IMPLANT
CATH INFINITI 5FR ANG PIGTAIL (CATHETERS) ×1 IMPLANT
CATH OPTITORQUE TIG 4.0 5F (CATHETERS) ×1 IMPLANT
CATH VISTA GUIDE 6FR XB3.5 (CATHETERS) ×1 IMPLANT
CATH VISTA GUIDE 6FR XBLAD3.5 (CATHETERS) ×1 IMPLANT
DEVICE RAD COMP TR BAND LRG (VASCULAR PRODUCTS) ×1 IMPLANT
GLIDESHEATH SLEND SS 6F .021 (SHEATH) ×1 IMPLANT
GUIDEWIRE INQWIRE 1.5J.035X260 (WIRE) IMPLANT
HOVERMATT SINGLE USE (MISCELLANEOUS) ×1 IMPLANT
INQWIRE 1.5J .035X260CM (WIRE) ×2
KIT ENCORE 26 ADVANTAGE (KITS) ×1 IMPLANT
KIT HEART LEFT (KITS) ×2 IMPLANT
PACK CARDIAC CATHETERIZATION (CUSTOM PROCEDURE TRAY) ×2 IMPLANT
STENT SYNERGY DES 4X16 (Permanent Stent) ×1 IMPLANT
SYR MEDRAD MARK V 150ML (SYRINGE) ×2 IMPLANT
TRANSDUCER W/STOPCOCK (MISCELLANEOUS) ×2 IMPLANT
TUBING CIL FLEX 10 FLL-RA (TUBING) ×2 IMPLANT
WIRE ASAHI PROWATER 180CM (WIRE) ×2 IMPLANT

## 2016-08-04 NOTE — ED Notes (Addendum)
Pt to cath lab. Report at bedside to lab staff RN.

## 2016-08-04 NOTE — ED Notes (Addendum)
Pt has 15 second episode of vtach with altered loc.No pulse noted. Shock delivered at 150J. Returns to SR and is able to speak. Placed on non rebreather . Wife at bedside.

## 2016-08-04 NOTE — ED Notes (Signed)
ED Provider at bedside. 

## 2016-08-04 NOTE — ED Notes (Signed)
Pt's belongings in a bag and given to pt's wife. Wife given cell phone in hand.

## 2016-08-04 NOTE — H&P (Signed)
Cardiology H&P    Patient ID: SMAUEL HORACEK MRN: [id number], DOB/AGE: [birth year]   Admit date: 08/04/2016 Date of Consult: 08/04/2016  Primary Physician: Haywood Pao, MD   Patient Profile    Terry Wright is a 61 year old male, retired Airline pilot with a past medical history of HLD. He presented today as a STEMI.   History of Present Illness    Terry [place] was at the beach this past weekend and developed intermittent chest pain on Sunday 07/30/16 which he says felt like indigestion. His pain was intermittent all week, but associated with nausea and diaphoresis. He went to his PCP yesterday, 08/03/16 and reported chest pain and indigestion pain. No EKG was done at the PCP's office.   This morning he told his wife that his chest pain was intense and not indigestion and asked her to take him to the ED. Upon arrival to the ED his EKG showed lateral ST depression. He had an episode of Vfib and received one shock at 150J with return to NSR.   He is a former smoker, quit 3 years ago. He does take a statin drug according his wife. He denies history of CVA, or any clotting disorder. His mother had 2 MI's.   Past Medical History   Past Medical History:  Diagnosis Date  . DVT (deep venous thrombosis) (HCC)    Right upper arm DVT on 2 occasions in the remote past  . Dyslipidemia    Patient mentions triglycerides specifically  . Ejection fraction    LV function normal, echo, February, 2010  . GERD (gastroesophageal reflux disease)   . Hypertension   . Mitral regurgitation    Mild, echo, February, 2010  . Palpitations    Mild in the past  . Statin intolerance    Felt poorly after Lipitor, Crestor, TriCor  . Tobacco abuse     Past Surgical History:  Procedure Laterality Date  . HERNIA REPAIR    . MR WRIST LEFT       Allergies  No Known Allergies  Inpatient Medications      Family History    Family History  Problem Relation Age of Onset  . Heart attack Mother     . Hypertension Father     Social History    Social History   Social History  . Marital status: Married    Spouse name: N/A  . Number of children: N/A  . Years of education: N/A   Occupational History  . Not on file.   Social History Main Topics  . Smoking status: Former Research scientist (life sciences)  . Smokeless tobacco: Never Used  . Alcohol use Yes     Comment: occasional  . Drug use: No  . Sexual activity: Not on file   Other Topics Concern  . Not on file   Social History Narrative  . No narrative on file     Review of Systems    General:  No chills, fever, night sweats or weight changes.  Cardiovascular: + chest pain, dyspnea on exertion, edema, orthopnea, palpitations, paroxysmal nocturnal dyspnea. Dermatological: No rash, lesions/masses Respiratory: No cough, dyspnea Urologic: No hematuria, dysuria Abdominal:   No nausea, vomiting, diarrhea, bright red blood per rectum, melena, or hematemesis Neurologic:  No visual changes, wkns, changes in mental status. All other systems reviewed and are otherwise negative except as noted above.  Physical Exam    Blood pressure (!) 164/102, pulse 87, temperature 97.7 F (36.5 C), temperature source Oral, resp.  rate (!) 28, height 6\' 1"  (1.854 m), weight 220 lb (99.8 kg), SpO2 98 %.  General: male in no acute distress.  Psych: Normal affect. Neuro: Alert and oriented X 3. Moves all extremities spontaneously. HEENT: Normal  Neck: Supple without bruits or JVD. Lungs:  Resp regular and unlabored, CTA. Heart: RRR no s3, s4, or murmurs. Abdomen: Soft, non-tender, non-distended, BS + x 4.  Extremities: No clubbing, cyanosis or edema. DP/PT/Radials 2+ and equal bilaterally.  Labs    Troponin [facility] Test)  Recent Labs  08/04/16 0947  TROPIPOC 0.50*     Recent Labs Lab 08/04/16 0949  NA 137  K 4.7  CL 103  BUN 26*  CREATININE 1.10  GLUCOSE 154*     Radiology Studies    No results found.  EKG & Cardiac Imaging     EKG: NSR with lateral ST depression    Assessment & Plan    1. Acute MI: Patient taken urgently to the cath lab. Further MD recommendations to follow.   Signed, Arbutus Leas, NP 08/04/2016, 10:23 AM Pager: [phone]

## 2016-08-04 NOTE — Progress Notes (Signed)
Responded to code stemi page to releave chaplain and continue support. Patient procedure complete and family waiting on doctor to come out for final briefing before patient goes to room.  Wife was very tearful but is supported by friend and family. Provided empathetic listening, presence and emotional support. Chaplain available as needed.   08/04/16 1100  Clinical Encounter Type  Visited With Family;Patient not available;Health care provider  Visit Type Initial;Spiritual support;Pre-op;Post-op  Referral From Chaplain  Spiritual Encounters  Spiritual Needs Emotional  Stress Factors  Family Stress Factors Exhausted;Health changes     08/04/16 1100  Clinical Encounter Type  Visited With Family;Patient not available;Health care provider  Visit Type Initial;Spiritual support;Pre-op;Post-op  Referral From Chaplain  Spiritual Encounters  Spiritual Needs Emotional  Stress Factors  Family Stress Factors Exhausted;Health changes  Jaclynn Major, [place]

## 2016-08-04 NOTE — ED Notes (Addendum)
Pt given 4000 heperin iv and aspirin 300mg  pr.

## 2016-08-04 NOTE — ED Provider Notes (Signed)
[facility] DEPT Provider Note   CSN: [id number] Arrival date & time: 08/04/16  [id number]     History   Chief Complaint Chief Complaint  Patient presents with  . Chest Pain    HPI Terry Wright is a 61 y.o. male.  HPI Patient had chest pain that awakened him from sleep and has been very severe. He reports is radiating to his neck and shoulder. It's been present since approximately 7 AM. Patient denies history of prior MI. Some pain episodes started about 4 days ago. They had been coming and going. Patient was seen by primary care and reflux was suspected. Patient was started on anti-reflux medications without much relief. His wife reports he did have a better night last night but then when he awakened this morning the pain was worse that had been in the preceding days. They deny history of GI bleed or rectal bleeding. Past Medical History:  Diagnosis Date  . DVT (deep venous thrombosis) (HCC)    Right upper arm DVT on 2 occasions in the remote past  . Dyslipidemia    Patient mentions triglycerides specifically  . Ejection fraction    LV function normal, echo, February, 2010  . GERD (gastroesophageal reflux disease)   . Hypertension   . Mitral regurgitation    Mild, echo, February, 2010  . Palpitations    Mild in the past  . Statin intolerance    Felt poorly after Lipitor, Crestor, TriCor  . Tobacco abuse     Patient Active Problem List   Diagnosis Date Noted  . Ejection fraction   . Mitral regurgitation   . DVT (deep venous thrombosis) ([place])   . Dyslipidemia   . GERD (gastroesophageal reflux disease)   . Hypertension   . Palpitations   . Tobacco abuse   . Statin intolerance     Past Surgical History:  Procedure Laterality Date  . HERNIA REPAIR    . MR WRIST LEFT         Home Medications    Prior to Admission medications   Medication Sig Start Date End Date Taking? Authorizing Provider  Alum & Mag Hydroxide-Simeth (GI COCKTAIL) SUSP suspension Take 30  mLs by mouth once. Shake well. 09/06/12   Tiffany Carlota Raspberry, PA-C  aspirin 81 MG tablet Take 81 mg by mouth every morning.     Historical Provider, MD  esomeprazole (NEXIUM) 40 MG capsule Take 40 mg by mouth daily before breakfast.    Historical Provider, MD  ibuprofen (ADVIL,MOTRIN) 200 MG tablet Take 800 mg by mouth every 6 (six) hours as needed. For pain    Historical Provider, MD  ondansetron (ZOFRAN) 4 MG tablet Take 1 tablet (4 mg total) by mouth every 6 (six) hours. 09/06/12   Tiffany Carlota Raspberry, PA-C  ondansetron (ZOFRAN-ODT) 8 MG disintegrating tablet Take 8 mg by mouth every 8 (eight) hours as needed. For nausea or vomiting    Historical Provider, MD  pravastatin (PRAVACHOL) 40 MG tablet Take 0.5 tablets (20 mg total) by mouth every evening. 06/08/11 06/07/12  Carlena Bjornstad, MD  simvastatin (ZOCOR) 40 MG tablet Take 40 mg by mouth every evening.    Historical Provider, MD    Family History No family history on file.  Social History Social History  Substance Use Topics  . Smoking status: Former Research scientist (life sciences)  . Smokeless tobacco: Never Used  . Alcohol use Yes     Comment: occasional     Allergies   Patient has no known allergies.  Review of Systems Review of Systems 10 Systems reviewed and are negative for acute change except as noted in the HPI.   Physical Exam Updated Vital Signs BP (!) 164/102   Pulse 87   Temp 97.7 F (36.5 C) (Oral)   Resp (!) 28   Ht 6\' 1"  (1.854 m)   Wt 220 lb (99.8 kg)   SpO2 100%   BMI 29.03 kg/m   Physical Exam  Constitutional: He is oriented to person, place, and time. He appears well-developed and well-nourished.  As I walk into the room the patient is alert sitting in the stretcher with no respiratory distress good color normal mental status.   HENT:  Head: Normocephalic and atraumatic.  Nose: Nose normal.  Mouth/Throat: Oropharynx is clear and moist.  Eyes: EOM are normal.  Cardiovascular: Normal rate and regular rhythm.   Pulmonary/Chest:  Effort normal.  Abdominal: Soft. He exhibits no distension. There is no tenderness.  Musculoskeletal: He exhibits no edema or tenderness.  Neurological: He is alert and oriented to person, place, and time. He exhibits normal muscle tone. Coordination normal.  Skin: Skin is warm and dry.  Psychiatric: He has a normal mood and affect.     ED Treatments / Results  Labs (all labs ordered are listed, but only abnormal results are displayed) Labs Reviewed  I-STAT CG4 LACTIC ACID, ED - Abnormal; Notable for the following:       Result Value   Lactic Acid, Venous 2.83 (*)    All other components within normal limits    EKG  EKG Interpretation  Date/Time:  Friday August 04 2016 09:26:34 EST Ventricular Rate:  93 PR Interval:    QRS Duration: 107 QT Interval:  345 QTC Calculation: 430 R Axis:   83 Text Interpretation:  Sinus rhythm Borderline right axis deviation Posterior infarct, acute (LCx) ST elevation, consider inferior injury depression suggestive of posterior MI. change from previous Confirmed by Johnney Killian, MD, Jeannie Done [phone]) on 08/04/2016 9:33:55 AM       Radiology No results found.  Procedures Procedures (including critical care time) CRITICAL CARE Performed by: Charlesetta Shanks   Total critical care time: 30 minutes  Critical care time was exclusive of separately billable procedures and treating other patients.  Critical care was necessary to treat or prevent imminent or life-threatening deterioration.  Critical care was time spent personally by me on the following activities: development of treatment plan with patient and/or surrogate as well as nursing, discussions with consultants, evaluation of patient's response to treatment, examination of patient, obtaining history from patient or surrogate, ordering and performing treatments and interventions, ordering and review of laboratory studies, ordering and review of radiographic studies, pulse oximetry and re-evaluation  of patient's condition. Medications Ordered in ED Medications - No data to display   Initial Impression / Assessment and Plan / ED Course  I have reviewed the triage vital signs and the nursing notes.  Pertinent labs & imaging results that were available during my care of the patient were reviewed by me and considered in my medical decision making (see chart for details).  Clinical Course    Code STEMI call.  Final Clinical Impressions(s) / ED Diagnoses   Final diagnoses:  Cardiac arrest with ventricular fibrillation ([place])  Acute ST elevation myocardial infarction (STEMI), unspecified artery ([place])   I entered the room and asked that patient several questions regarding his chest pain and had nurses activate code STEMI. Within several minutes of this, the patient had ventricular fibrillation arrest.  Patient developed sonorous respirations with fixed gaze. ALS resuscitation immediately initiated. Airway supported with jaw thrust maneuver and defibrillation pads applied. With one defibrillation event, patient converted back to sinus rhythm. Pulses were palpable. Patient regained normal mental status. He initially complained of dyspnea which resolved after several minutes the arrest episode on a nonrebreather mask. With reassessment, he reported chest pain did persist but was less than it had been previously. Patient was not showing acute respiratory distress after the event. IV heparin and aspirin initiated. Patient taken directly to Cath Lab. New Prescriptions Current Discharge Medication List       Charlesetta Shanks, MD 08/04/16 1003

## 2016-08-04 NOTE — Progress Notes (Signed)
Responded to administrative staff's direction to A rm for STEMI not yet paged, just as pt's emotionally distraught wife was brought into rm, given husband's possessions. Escorted wife to Cath Lab waiting area. Providied emotional support thru Cath lab nurse's first report, then colleague arrived, escorting in other family/friend, and continued support.   08/04/16 1000  Clinical Encounter Type  Visited With Family  Visit Type Initial;ED  Referral From Nurse  Spiritual Encounters  Spiritual Needs Emotional  Stress Factors  Family Stress Factors Family relationships;Health changes;Loss of control   Gerrit Heck, Chaplain

## 2016-08-04 NOTE — Care Management Note (Signed)
Case Management Note  Patient Details  Name: Terry Wright MRN: [id number] Date of Birth: 1955/12/25  Subjective/Objective:     Adm w mi               Action/Plan: lives w fam, pcp dr Osborne Casco   Expected Discharge Date:                  Expected Discharge Plan:  Home/Self Care  In-House Referral:     Discharge planning Services  CM Consult, Medication Assistance  Post Acute Care Choice:    Choice offered to:     DME Arranged:    DME Agency:     HH Arranged:    HH Agency:     Status of Service:  Completed, signed off  If discussed at [employer] of Stay Meetings, dates discussed:    Additional Comments: gave pt 30day free and copay card for brilinta. States has [employer] ins. No ins listed yet as pt just adm.  Lacretia Leigh, RN 08/04/2016, 2:04 PM

## 2016-08-04 NOTE — ED Triage Notes (Signed)
Pt c/o chest pain since Monday morning, waking him in night. Pt stgates chest pain worseening. Radiates to left arm, nausea and sweating.c/o SHOB.

## 2016-08-04 NOTE — Interval H&P Note (Signed)
History and Physical Interval Note:  08/04/2016 11:04 AM  Terry Wright  has presented today for surgery, with the diagnosis of stemi  The various methods of treatment have been discussed with the patient and family. After consideration of risks, benefits and other options for treatment, the patient has consented to  Procedure(s): Left Heart Cath and Coronary Angiography (N/A) as a surgical intervention .  The patient's history has been reviewed, patient examined, no change in status, stable for surgery.  I have reviewed the patient's chart and labs.  Questions were answered to the patient's satisfaction.     [name]

## 2016-08-04 NOTE — ED Notes (Signed)
Patient's wedding band given to wife at bedside

## 2016-08-05 ENCOUNTER — Inpatient Hospital Stay (HOSPITAL_COMMUNITY): Payer: Commercial Managed Care - HMO

## 2016-08-05 DIAGNOSIS — I2510 Atherosclerotic heart disease of native coronary artery without angina pectoris: Secondary | ICD-10-CM

## 2016-08-05 HISTORY — PX: TRANSTHORACIC ECHOCARDIOGRAM: SHX275

## 2016-08-05 LAB — BASIC METABOLIC PANEL
Anion gap: 9 (ref 5–15)
BUN: 13 mg/dL (ref 6–20)
CO2: 24 mmol/L (ref 22–32)
Calcium: 9.1 mg/dL (ref 8.9–10.3)
Chloride: 105 mmol/L (ref 101–111)
Creatinine, Ser: 0.91 mg/dL (ref 0.61–1.24)
GFR calc Af Amer: >60 mL/min (ref >60)
GFR calc non Af Amer: >60 mL/min (ref >60)
Glucose, Bld: 167 mg/dL — ABNORMAL HIGH (ref 65–99)
Potassium: 3.7 mmol/L (ref 3.5–5.1)
Sodium: 138 mmol/L (ref 135–145)

## 2016-08-05 LAB — CBC
HCT: 45.5 % (ref 39.0–52.0)
Hemoglobin: 15.9 g/dL (ref 13.0–17.0)
MCH: 31.4 pg (ref 26.0–34.0)
MCHC: 34.9 g/dL (ref 30.0–36.0)
MCV: 89.7 fL (ref 78.0–100.0)
Platelets: 202 10*3/uL (ref 150–400)
RBC: 5.07 MIL/uL (ref 4.22–5.81)
RDW: 13.4 % (ref 11.5–15.5)
WBC: 12 10*3/uL — ABNORMAL HIGH (ref 4.0–10.5)

## 2016-08-05 LAB — TROPONIN I
Troponin I: >65 ng/mL (ref <0.03)
Troponin I: 48.82 ng/mL (ref <0.03)

## 2016-08-05 MED ORDER — LISINOPRIL 2.5 MG PO TABS
2.5000 mg | ORAL_TABLET | Freq: Every day | ORAL | Status: DC
  Administered 2016-08-05: 2.5 mg via ORAL
  Filled 2016-08-05: qty 1

## 2016-08-05 NOTE — Progress Notes (Signed)
Patient Name: Terry Wright Date of Encounter: 08/05/2016  Primary Cardiologist: Ellyn Hack (new)  Hospital Problem List     Principal Problem:   ST elevation myocardial infarction (STEMI) of true posterior wall, initial episode of care [facility]) Active Problems:   Dyslipidemia   Cardiac arrest due to underlying cardiac condition [facility])   Cardiac arrest with ventricular fibrillation ([place])     Subjective   Feels well, even when walking around. No angina/pleurisy and no dyspnea. Right radial site healthy. Echo not yet performed. 4-6 beat runs of NSVT yesterday afternoon, PVCs/bigeminy through the night, now rare isolated PVCs. Troponin >65 x 2 assays.  Inpatient Medications    Scheduled Meds: . aspirin  81 mg Oral Daily  . carvedilol  6.25 mg Oral BID WC  . pantoprazole  40 mg Oral Daily  . pravastatin  80 mg Oral q1800  . sodium chloride flush  3 mL Intravenous Q12H  . ticagrelor  90 mg Oral BID   Continuous Infusions:  PRN Meds: sodium chloride, acetaminophen, LORazepam, ondansetron (ZOFRAN) IV, sodium chloride flush   Vital Signs    Vitals:   08/05/16 0424 08/05/16 0625 08/05/16 0800 08/05/16 0810  BP: 112/70 100/72  118/73  Pulse: 69 72 75 70  Resp: 20 20 20 19   Temp: 98.2 F (36.8 C)   98.7 F (37.1 C)  TempSrc: Oral   Oral  SpO2: 94% 95% 95% 95%  Weight:      Height:        Intake/Output Summary (Last 24 hours) at 08/05/16 1028 Last data filed at 08/05/16 0900  Gross per 24 hour  Intake          1198.56 ml  Output             3125 ml  Net         -1926.44 ml   Filed Weights   08/04/16 0930 08/04/16 1145  Weight: 220 lb (99.8 kg) 220 lb 0.3 oz (99.8 kg)    Physical Exam   Comfortable GEN: Well nourished, well developed, in no acute distress.  HEENT: Grossly normal.  Neck: Supple, no JVD, carotid bruits, or masses. Cardiac: RRR, no murmurs, rubs, or gallops. No clubbing, cyanosis, edema.  Radials/DP/PT 2+ and equal bilaterally.  Respiratory:   Respirations regular and unlabored, clear to auscultation bilaterally. GI: Soft, nontender, nondistended, BS + x 4. MS: no deformity or atrophy. Skin: warm and dry, no rash. Neuro:  Strength and sensation are intact. Psych: AAOx3.  Normal affect.  Labs    CBC  Recent Labs  08/04/16 0947 08/04/16 0949 08/05/16 0541  WBC 9.9  --  12.0*  NEUTROABS 5.0  --   --   HGB 17.9* 18.4* 15.9  HCT 50.4 54.0* 45.5  MCV 90.3  --  89.7  PLT 174  --  [id number]   Basic Metabolic Panel  Recent Labs  08/04/16 1046 08/05/16 0541  NA 134* 138  K 3.8 3.7  CL 105 105  CO2 22 24  GLUCOSE 164* 167*  BUN 14 13  CREATININE 0.80 0.91  CALCIUM 8.6* 9.1   Liver Function Tests  Recent Labs  08/04/16 1046  AST 113*  ALT 51  ALKPHOS 52  BILITOT 0.7  PROT 6.2*  ALBUMIN 3.6   No results for input(s): LIPASE, AMYLASE in the last 72 hours. Cardiac Enzymes  Recent Labs  08/04/16 1909 08/05/16 0010 08/05/16 0541  TROPONINI >65.00* >65.00* 48.82*     Telemetry  4-6 beat runs of NSVT yesterday afternoon, PVCs/bigeminy through the night, now rare isolated PVCs. - Personally Reviewed  ECG    NSR, tall R waves in V1-V2, ST depression and TWI in I and aVL, consistent with evolving posterior infarction. - Personally Reviewed  Radiology    No results found.  Cardiac Studies  CATH:  Prox Cx lesion, 100 %stenosed.  A STENT SYNERGY DES 4X16 drug eluting stent was successfully placed. Postdilated to 4.5 mm  Post intervention, there is a 0% residual stenosis.  __________________________________  Colon Flattery 3rd Mrg lesion, 65 %stenosed in a very small caliber vessel. Otherwise minimal CAD and remaining vessels.  The left ventricular ejection fraction is 35-45% by visual estimate - with apparent anterolateral hypokinesis  LV end diastolic pressure is mildly elevated.   Acute posterior STEMI, complicated by VF/VT arrest with the culprit lesion being 100% proximal occlusion of a very large  almost codominant Circumflex.  This was treated with successful DES stenting.  Patient Profile     61 yo man with newly diagnosed CAD presenting as posterior STEMI complicated by VT/VF arrest with immediate defibrillation in ED and very early revascularization. Culprit vessel was a proximal lesion in a very large LCX, LVEF in cath lab 35%.  Assessment & Plan    1. Acute Posterior STEMI: extensive injury, risk of CHF. Waiting for echo. Discussed cardiac rehab, mandatory uninterrupted DAPT, hi dose statin and role of carvedilol/ACEi for CMP. 2. S/P VT/VF arrest: steady improvement in ventricular ectopy. Suspect we will see improvement in LVEF and do not think he will be at very risk for further serious arrhythmia. PVCs/palpitations are a long term issue, preceding the coronary event. 3. HLP: reported problems with statin intolerance in the past. Will try again and if truly intolerant he should start [place].  Signed, Sanda Klein, MD  08/05/2016, 10:28 AM

## 2016-08-05 NOTE — Progress Notes (Signed)
CARDIAC REHAB PHASE I   PRE:  Rate/Rhythm: 84  BP:  Sitting: 118/86     SaO2: 94ra  MODE:  Ambulation: 600 ft   POST:  Rate/Rhythm: 84  BP:  Sitting: 128/82     SaO2: 96ra  11:55am-12:53pm Patient ambulated at a steady pace with no assistance. No complaints. Education completed with wife at bedside. Agreed to Cardiac Rehab at [facility]. Patient back in bed with wife at side awaiting lunch tray.   [place], MS 08/05/2016 12:51 PM

## 2016-08-06 LAB — ECHOCARDIOGRAM COMPLETE
Height: 73 in
Weight: 3520.31 oz

## 2016-08-06 MED ORDER — ASPIRIN 81 MG PO CHEW: 81.0000 mg | CHEWABLE_TABLET | Freq: Every day | ORAL | 11 refills | Status: DC

## 2016-08-06 MED ORDER — CARVEDILOL 6.25 MG PO TABS: 6.2500 mg | ORAL_TABLET | Freq: Two times a day (BID) | ORAL | 11 refills | Status: DC

## 2016-08-06 MED ORDER — ATORVASTATIN CALCIUM 40 MG PO TABS: 40.0000 mg | ORAL_TABLET | Freq: Every day | ORAL | 11 refills | Status: DC

## 2016-08-06 MED ORDER — TICAGRELOR 90 MG PO TABS
90.0000 mg | ORAL_TABLET | Freq: Two times a day (BID) | ORAL | 11 refills | Status: DC
Start: 2016-08-06 — End: 2017-04-04

## 2016-08-06 MED ORDER — PANTOPRAZOLE SODIUM 40 MG PO TBEC: 40.0000 mg | DELAYED_RELEASE_TABLET | Freq: Every day | ORAL | 11 refills | Status: DC

## 2016-08-06 MED ORDER — ATORVASTATIN CALCIUM 40 MG PO TABS
40.0000 mg | ORAL_TABLET | Freq: Every day | ORAL | Status: DC
Start: 2016-08-06 — End: 2016-08-06

## 2016-08-06 MED ORDER — TICAGRELOR 90 MG PO TABS: 90.0000 mg | ORAL_TABLET | Freq: Two times a day (BID) | ORAL | 0 refills | Status: DC

## 2016-08-06 NOTE — Progress Notes (Signed)
Discharge paperwork reviewed with patient and wife. All questions answered. All prescriptions given. All belongs with family.

## 2016-08-06 NOTE — Progress Notes (Signed)
Patient Name: Terry Wright Date of Encounter: 08/06/2016  Primary Cardiologist: Terry Wright)  [facility] Problem List     Principal Problem:   ST elevation myocardial infarction (STEMI) of true posterior wall, initial episode of care Terry Wright) Active Problems:   Dyslipidemia   Cardiac arrest due to underlying cardiac condition Terry Wright)   Cardiac arrest with ventricular fibrillation (Terry Wright)     Subjective   Feels a little weak and nauseous. No angina or dyspnea.  Inpatient Medications    Scheduled Meds: . aspirin  81 mg Oral Daily  . carvedilol  6.25 mg Oral BID WC  . lisinopril  2.5 mg Oral Daily  . pantoprazole  40 mg Oral Daily  . pravastatin  80 mg Oral q1800  . sodium chloride flush  3 mL Intravenous Q12H  . ticagrelor  90 mg Oral BID   Continuous Infusions:  PRN Meds: sodium chloride, acetaminophen, LORazepam, ondansetron (ZOFRAN) IV, sodium chloride flush   Vital Signs    Vitals:   08/06/16 0600 08/06/16 0700 08/06/16 0800 08/06/16 0811  BP:   111/64 111/64  Pulse:      Resp: 20 19 18 20   Temp:    98.4 F (36.9 C)  TempSrc:    Oral  SpO2:    99%  Weight:      Height:        Intake/Output Summary (Last 24 hours) at 08/06/16 0841 Last data filed at 08/06/16 0800  Gross per 24 hour  Intake              720 ml  Output              600 ml  Net              120 ml   Filed Weights   08/04/16 0930 08/04/16 1145  Weight: 220 lb (99.8 kg) 220 lb 0.3 oz (99.8 kg)    Physical Exam   Looks comfortable GEN: Well nourished, well developed, in no acute distress.  HEENT: Grossly normal.  Neck: Supple, no JVD, carotid bruits, or masses. Cardiac: RRR, no murmurs, rubs, or gallops. No clubbing, cyanosis, edema.  Radials/DP/PT 2+ and equal bilaterally.  Respiratory:  Respirations regular and unlabored, clear to auscultation bilaterally. GI: Soft, nontender, nondistended, BS + x 4. MS: no deformity or atrophy. Skin: warm and dry, no rash. Neuro:  Strength  and sensation are intact. Psych: AAOx3.  Normal affect.  Labs    CBC  Recent Labs  08/04/16 0947 08/04/16 0949 08/05/16 0541  WBC 9.9  --  12.0*  NEUTROABS 5.0  --   --   HGB 17.9* 18.4* 15.9  HCT 50.4 54.0* 45.5  MCV 90.3  --  89.7  PLT 174  --  [id number]   Basic Metabolic Panel  Recent Labs  08/04/16 1046 08/05/16 0541  NA 134* 138  K 3.8 3.7  CL 105 105  CO2 22 24  GLUCOSE 164* 167*  BUN 14 13  CREATININE 0.80 0.91  CALCIUM 8.6* 9.1   Liver Function Tests  Recent Labs  08/04/16 1046  AST 113*  ALT 51  ALKPHOS 52  BILITOT 0.7  PROT 6.2*  ALBUMIN 3.6   No results for input(s): LIPASE, AMYLASE in the last 72 hours. Cardiac Enzymes  Recent Labs  08/04/16 1909 08/05/16 0010 08/05/16 0541  TROPONINI >65.00* >65.00* 48.82*     Telemetry    NSR, isolated PVCs - Personally Reviewed  ECG    NSR,  posterior infarction - Personally Reviewed   Cardiac Studies  Echo 08/04/16 - Left ventricle: The cavity size was normal. There was mild   concentric hypertrophy. Systolic function was mildly to   moderately reduced. The estimated ejection fraction was in the   range of 40% to 45%. Severe hypokinesis of the   basal-midinferolateral myocardium; consistent with infarction in   the distribution of the left circumflex coronary artery. Doppler   parameters are consistent with abnormal left ventricular   relaxation (grade 1 diastolic dysfunction).  CATH:  Prox Cx lesion, 100 %stenosed.  A STENT SYNERGY DES 4X16 drug eluting stent was successfully placed. Postdilated to 4.5 mm  Post intervention, there is a 0% residual stenosis.  __________________________________  Colon Flattery 3rd Mrg lesion, 65 %stenosed in a very small caliber vessel. Otherwise minimal CAD and remaining vessels.  The left ventricular ejection fraction is 35-45% by visual estimate - with apparent anterolateral hypokinesis  LV end diastolic pressure is mildly elevated.  Acute posterior STEMI,  complicated by VF/VT arrest with the culprit lesion being 100% proximal occlusion of a very large almost codominant Circumflex. This was treated with successful DES stenting.  Patient Profile     61 yo man with newly diagnosed CAD presenting as posterior STEMI complicated by VT/VF arrest with immediate defibrillation in ED and very early revascularization. Culprit vessel was a proximal lesion in a very large LCX, LVEF in cath lab 35%, improved 40-45% on next day echo.  Assessment & Plan    1. Acute Posterior STEMI: extensive injury,decreased EF, but normal filling pressures on echo. Discussed cardiac rehab, mandatory uninterrupted DAPT, hi dose statin and role of carvedilol/ACEi for CMP. BP relatively low, will not titrate meds any further and will stop lisinopril for now. 2. S/P VT/VF arrest: steady improvement in ventricular ectopy. Suspect we will see improvement in LVEF and do not think he will be at very risk for further serious arrhythmia. PVCs/palpitations are a long term issue, preceding the coronary event. 3. HLP: reported problems with statin intolerance in the past. Will try again and if truly intolerant he should start [place].  Signed, Sanda Klein, MD  08/06/2016, 8:41 AM

## 2016-08-06 NOTE — Discharge Instructions (Signed)
Heart Attack A heart attack (myocardial infarction, MI) causes damage to the heart that cannot be fixed. A heart attack often happens when a blood clot or other blockage cuts blood flow to the heart. When this happens, certain areas of the heart begin to die. This causes the pain you feel during a heart attack. Follow these instructions at home:  Take medicine as told by your doctor. You may need medicine to:  Keep your blood from clotting too easily.  Control your blood pressure.  Lower your cholesterol.  Control abnormal heart rhythms.  Change certain behaviors as told by your doctor. This may include:  Quitting smoking.  Being active.  Eating a heart-healthy diet. Ask your doctor for help with this diet.  Keeping a healthy weight.  Keeping your diabetes under control.  Lessening stress.  Limiting how much alcohol you drink. Do not take these medicines unless your doctor says that you can:  Nonsteroidal anti-inflammatory drugs (NSAIDs). These include:  Ibuprofen.  Naproxen.  Celecoxib.  Vitamin supplements that have vitamin A, vitamin E, or both.  Hormone therapy that contains estrogen with or without progestin. Get help right away if:  You have sudden chest discomfort.  You have sudden discomfort in your:  Arms.  Back.  Neck.  Jaw.  You have shortness of breath at any time.  You have sudden sweating or clammy skin.  You feel sick to your stomach (nauseous) or throw up (vomit).  You suddenly get light-headed or dizzy.  You feel your heart beating fast or skipping beats. These symptoms may be an emergency. Do not wait to see if the symptoms will go away. Get medical help right away. Call your local emergency services (911 in the [place] Do not drive yourself to the hospital.  This information is not intended to replace advice given to you by your health care provider. Make sure you discuss any questions you have with your health care  provider. Document Released: 01/16/2012 Document Revised: 12/23/2015 Document Reviewed: 09/19/2013 Elsevier Interactive Patient Education  2017 [employer]    Heart Disease Prevention Heart disease is a leading cause of death. There are many things you can do to help prevent heart disease. Be physically active Physical activity is good for your heart. It helps control your blood pressure, cholesterol levels, and weight. Try to be physically active every day. Ask your health care provider what activities are best for you. Be a healthy weight Extra weight can strain your heart and affect your blood pressure and cholesterol levels. Lose weight with diet and exercise if recommended by your health care provider. Eat heart-healthy foods Follow a healthy eating plan as recommended by your health care provider or dietitian. Heart-healthy foods include:  High-fiber foods. These include oat bran, oatmeal, and whole-grain breads and cereals.  Fruits and vegetables. Avoid:  Alcohol.  Fried foods.  Foods high in saturated fat. These include meats, butter, whole dairy products, shortening, and coconut or palm oil.  Salty foods. These include canned food, luncheon meat, salty snacks, and fast food. Keep your cholesterol levels under control Cholesterol is a substance that is used for many important functions. When your cholesterol levels are high, cholesterol can stick to the insides of your blood vessels, making them narrow or clog. This can lead to chest pain (angina) and a heart attack. Keep your cholesterol levels under control as recommended by your health care provider. Have your cholesterol checked at least once a year. Target cholesterol levels (in mg/dL) for  most people are:  Total cholesterol below 200.  LDL cholesterol below 100.  HDL cholesterol above 40 in men and above 50 in women.  Triglycerides below 150. Keep your blood pressure under control Having high blood pressure  (hypertension) puts you at risk for stroke and other forms of heart disease. Keep your blood pressure under control as recommended by your health care provider. Ask your health care provider if you need treatment to lower your blood pressure. If you are 39-7 years of age, have your blood pressure checked every 3-5 years. If you are 22 years of age or older, have your blood pressure checked every year. Do not use tobacco products Tobacco smoke can damage your heart and blood vessels. Do not use any tobacco products including cigarettes, chewing tobacco, or electronic cigarettes. If you need help quitting, ask your health care provider. Take medicines as directed Take medicines only as directed by your health care provider. Ask your health care provider whether you should take an aspirin every day. Taking aspirin can help reduce your risk of heart disease and stroke. Where to find more information: To find out more about heart disease, visit the American Heart Association's website at www.americanheart.org This information is not intended to replace advice given to you by your health care provider. Make sure you discuss any questions you have with your health care provider. Document Released: 02/29/2004 Document Revised: 12/15/2015 Document Reviewed: 09/10/2013 Elsevier Interactive Patient Education  2017 [employer].

## 2016-08-06 NOTE — Discharge Summary (Signed)
Discharge Summary    Patient ID: Terry Wright  MRN: [id number], DOB/AGE: Jul 05, 1956 61 y.o.  Admit Date: 08/04/2016 Discharge Date: 08/06/2016  Primary Care Provider: Haywood Pao, MD Primary Cardiologist: Dr. Ellyn Hack, MD [facility])  Discharge Diagnoses    Principal Problem:   ST elevation myocardial infarction (STEMI) of true posterior wall, initial episode of care [facility]) Active Problems:   Cardiac arrest due to underlying cardiac condition [facility])   Cardiac arrest with ventricular fibrillation ([place])   Dyslipidemia   Palpitations   Tobacco abuse   Allergies No Known Allergies   History of Present Illness     61 year old male, retired Air[employer] pilot, with history of recently diagnosed CAD s/p posterior MI as below, prior PVCs/palpitations, HLD, former tobacco abuse quitting in 2015 who presented to [facility] on 08/04/16 with acute posterior ST elevation MI complicated by VF/VT arrest s/p shock x 1 at 150J.   Patient [place] the beach the [place] prior to his admission and developed intermittent chest pain on 07/30/16 that felt like indigestion. Pain was intermittent all week with associated diaphoresis and nausea. Saw PCP on 08/03/16 with reported chest pain and indigestion. No EKG done at that time per report. On the morning of 08/04/16 he noted to his wife his chest pain was intense and not indigestion. She brought him to the ED.   Hospital Course     Consultants: Cardiac rehab  Upon the patient's arrival to [facility] his EKG showed lateral ST depression. He had an episode of Vfib receiving one shock at 150J with return to NSR. He was taken emergently to the cardiac cath lab which showed proximal LCx 100% stenosis s/p Synergy DES 4 x 16 mm post dilated to 4.5 mm with 0% residual stenosis. There was also 65% stenosis of the OM3 (very small caliber vessel), otherwise minimal CAD in remaining vessels. LV EF was estimated at 35-45% with apparent anterolateral hypokinesis. LVEDP was  mildly elevated. Culprit lesion being 100% proximal occlusion of a very large almost codominant LCx. Troponin peaked at > 65. He was noted to have 4-6 beat run of NSVT on rounds on 1/6 with PVCs/bitgeminy that improved as the day went on. Post procedure echo on 08/06/16 showed an EF of 40-45%, severe hypokinesis of the basal-midinferolateral myocardium consistent with infarction in the distribution of the LCx artery. GR1DD. He worked with phase I cardiac rehab without complaints prior to discharge and plans to work with cardiac rehab as an outpatient. In rounds on 1/7 he felt a little weak with nausea. No chest pain or dyspnea. Telemetry showed NSR with isolated PVCs. Upon Dr. Sallyanne Kuster checking in with the patient later in the day on 1/7 he felt like he wanted to go home. He will be discharged on uninterrupted DAPT (ASA 81 mg and Brilinta 90 mg bid), along with Coreg 6.25 mg bid, and Lipitor 40 mg (in place of his prior pravastatin). He was on lisinopril during his admission, though this was discontinued prior to discharge given soft BP with consideration for restarting at hospital follow up. It was felt he may see an improvement in ventricular ectopy as well as improvement in LVEF and that he will not be at risk for further serious arrhythmia. It was noted his PVCs/palpitations were a long term issue dating prior to his MI. He was noted to have prior statin intolerance. We will re-try him on Lipitor. If as an outpatient he cannot tolerate this medication we may need to  consider Repatha vs Praluent.   Labs at day of discharge showed wbc of 12.0 (drawn 1/6), hgb 15.9, plt 202, SCr 0.91, K+ 3.7.  The patient's right radial cath site has been examined is healing well without issues at this time. The patient has been seen by Dr. Sallyanne Kuster and felt to be stable for discharge today. All follow up appointments have been sent to the office with the patient being advised to contact the office if he has not heard from them  in 48 hours. Discharge medications are listed below. He has been advised to hold ibuprofen. Prescriptions have been reviewed with the patient and printed.   [facility] uses [facility] on the corner of [address] and [address]. They are open until 6 PM. He was given printed prescriptions in case he cannot make it to [facility] prior to them closing. If that is the case [facility] will need [facility] [address] as their pharmacy is open 24 hours. He has been instructed to not miss any doses of dual antiplatelet therapy.  _____________  Discharge Vitals Blood pressure 112/79, pulse 70, temperature 97.9 F (36.6 C), temperature source Oral, resp. rate 19, height '6\' 1"'  (1.854 m), weight 220 lb 0.3 oz (99.8 kg), SpO2 99 %.  Filed Weights   08/04/16 0930 08/04/16 1145  Weight: 220 lb (99.8 kg) 220 lb 0.3 oz (99.8 kg)    Labs & Radiologic Studies    CBC  Recent Labs  08/04/16 0947 08/04/16 0949 08/05/16 0541  WBC 9.9  --  12.0*  NEUTROABS 5.0  --   --   HGB 17.9* 18.4* 15.9  HCT 50.4 54.0* 45.5  MCV 90.3  --  89.7  PLT 174  --  599   Basic Metabolic Panel  Recent Labs  08/04/16 1046 08/05/16 0541  NA 134* 138  K 3.8 3.7  CL 105 105  CO2 22 24  GLUCOSE 164* 167*  BUN 14 13  CREATININE 0.80 0.91  CALCIUM 8.6* 9.1   Liver Function Tests  Recent Labs  08/04/16 1046  AST 113*  ALT 51  ALKPHOS 52  BILITOT 0.7  PROT 6.2*  ALBUMIN 3.6   No results for input(s): LIPASE, AMYLASE in the last 72 hours. Cardiac Enzymes  Recent Labs  08/04/16 1909 08/05/16 0010 08/05/16 0541  TROPONINI >65.00* >65.00* 48.82*   BNP Invalid input(s): POCBNP D-Dimer No results for input(s): DDIMER in the last 72 hours. Hemoglobin A1C No results for input(s): HGBA1C in the last 72 hours. Fasting Lipid Panel No results for input(s): CHOL, HDL, LDLCALC, TRIG, CHOLHDL, LDLDIRECT in the last 72 hours. Thyroid Function Tests No results for input(s): TSH, T4TOTAL, T3FREE, THYROIDAB in the last  72 hours.  Invalid input(s): FREET3 _____________  No results found.  Diagnostic Studies/Procedures   Left heart Cath 08/04/2016: Coronary Findings   Dominance: Right  Left Main  Vessel is large.  Left Anterior Descending  Ost LAD to Prox LAD lesion, 20% stenosed. The lesion is tubular and smooth.  First Diagonal Branch  Vessel is moderate in size.  Lateral First Diagonal Branch  Vessel is small in size.  First Septal Branch  Vessel is small in size.  Second Diagonal Branch  Vessel is small in size.  Second Septal Branch  Vessel is small in size.  Third Diagonal Branch  Vessel is small in size.  Third Septal Branch  Vessel is small in size.  Ramus Intermedius  Vessel is small.  Left Circumflex  Prox Cx lesion, 100% stenosed. Culprit lesion. The lesion is type A, located proximal to the major branch, irregular and thrombotic.  Angioplasty: Lesion length: 14 mm. Lesion crossed with guidewire using a WIRE ASAHI PROWATER 180CM. Pre-stent angioplasty was performed using a BALLOON EMERGE MR 2.5X12. Maximum pressure: 12 atm. Inflation time: 20 sec. A STENT SYNERGY DES 4X16 drug eluting stent was successfully placed. Minimum lumen area: 4.6 mm. Stent strut is well apposed. Post-stent angioplasty was performed using a BALLOON [place] EUPHORA [id number]. Maximum pressure: 18 atm. Inflation time: 30 sec. There is no pre-interventional antegrade distal flow (TIMI 0). The post-interventional distal flow is normal (TIMI 3). The intervention was successful . No complications occurred at this lesion. See other findings below. Initial Guide: CATH VISTA GUIDE 6FR XB3.5 ([id number]) With removal of the stent balloon, the size of the balloon caused the balloon to get stuck in the opening of the guide. With pulling further the balloon catheter itself broke. I therefore ensured that the balloon was inside the guide and pulled the guide along with guidewire out completely out of the body ensuring that all pieces of  the balloon catheter were removed. I then inserted a 2nd Guide: CATH VISTA GUIDE 6FR XBLAD3.5 ([id number]), and used a new guidewire for post dilation.  There is no residual stenosis post intervention.  First Obtuse Marginal Branch  Vessel is moderate in size. Vessel is angiographically normal.  Second Obtuse Marginal Branch  Vessel is moderate in size. Vessel is angiographically normal.  Third Obtuse Marginal Branch  Vessel is small in size. Vessel is angiographically normal.  Ost 3rd Mrg lesion, 65% stenosed. The lesion is tubular and smooth. Roughly 1.5-2 mm vessel  Right Coronary Artery  Vessel is large.  Dist RCA lesion, 20% stenosed. The lesion is tubular and smooth.  Acute Marginal Branch  Vessel is small in size.  Inferior Septal  Vessel is small in size.  First Right Posterolateral  Vessel is small in size.  Second Right Posterolateral  Vessel is small in size. Vessel is angiographically normal.  Wall Motion      Anterolateral hypokinesis          Left Heart   Left Ventricle The left ventricular size is normal. There is moderate left ventricular systolic dysfunction. LV end diastolic pressure is mildly elevated. The left ventricular ejection fraction is 35-45% by visual estimate. There are LV function abnormalities due to segmental dysfunction. Anterolateral/lateral hypokinesis There is trivial (1+) mitral regurgitation.    Aortic Valve There is no aortic valve stenosis.    Coronary Diagrams   Diagnostic Diagram     Post-Intervention Diagram       Echo 08/06/2016: Study Conclusions  - Left ventricle: The cavity size was normal. There was mild   concentric hypertrophy. Systolic function was mildly to   moderately reduced. The estimated ejection fraction was in the   range of 40% to 45%. Severe hypokinesis of the   basal-midinferolateral myocardium; consistent with infarction in   the distribution of the left circumflex coronary artery. Doppler   parameters  are consistent with abnormal left ventricular   relaxation (grade 1 diastolic dysfunction). - Aortic valve: Valve area (VTI): 3.71 cm^2. Valve area (Vmax):   3.97 cm^2. Valve area (Vmean): 3.4 cm^2. _____________  Disposition   Pt is being discharged home today in good condition.  Follow-up Plans & Appointments    Follow-up Information    [facility] Follow [facility].   Specialty:  Cardiology Why:  a message has been sent to the office for hospital follow up in 7 days (TCM). He has been instructed to call the office if he has not heard from them in 48 hours.  Contact information: [address] [place] [phone]         Discharge Instructions    ([place]) Call MD:  Anytime you have any of the following symptoms: 1) 3 pound weight gain in 24 hours or 5 pounds in 1 week 2) shortness of breath, with or without a dry hacking cough 3) swelling in the hands, feet or stomach 4) if you have to sleep on extra pillows at night in order to breathe.    Complete by:  As directed    AMB Referral to Cardiac Rehabilitation - Phase II    Complete by:  As directed    Diagnosis:   Coronary Stents STEMI     Amb Referral to Cardiac Rehabilitation    Complete by:  As directed    Diagnosis:  Coronary Stents   Call MD for:  difficulty breathing, headache or visual disturbances    Complete by:  As directed    Call MD for:  extreme fatigue    Complete by:  As directed    Call MD for:  hives    Complete by:  As directed    Call MD for:  persistant dizziness or light-headedness    Complete by:  As directed    Call MD for:  persistant nausea and vomiting    Complete by:  As directed    Call MD for:  redness, tenderness, or signs of infection (pain, swelling, redness, odor or green/yellow discharge around incision site)    Complete by:  As directed    Call MD for:  severe uncontrolled pain    Complete by:  As directed     Call MD for:  temperature >100.4    Complete by:  As directed    Diet - low sodium heart healthy    Complete by:  As directed    Increase activity slowly    Complete by:  As directed       Discharge Medications   Current Discharge Medication List    START taking these medications   Details  aspirin 81 MG chewable tablet Chew 1 tablet (81 mg total) by mouth daily. Qty: 30 tablet, Refills: 11    atorvastatin (LIPITOR) 40 MG tablet Take 1 tablet (40 mg total) by mouth daily at 6 PM. Qty: 30 tablet, Refills: 11    carvedilol (COREG) 6.25 MG tablet Take 1 tablet (6.25 mg total) by mouth 2 (two) times daily with a meal. Qty: 60 tablet, Refills: 11    pantoprazole (PROTONIX) 40 MG tablet Take 1 tablet (40 mg total) by mouth daily. Qty: 30 tablet, Refills: 11    !! ticagrelor (BRILINTA) 90 MG TABS tablet Take 1 tablet (90 mg total) by mouth 2 (two) times daily. Qty: 60 tablet, Refills: 0    !! ticagrelor (BRILINTA) 90 MG TABS tablet Take 1 tablet (90 mg total) by mouth 2 (two) times daily. Qty: 60 tablet, Refills: 11     !! - Potential duplicate medications found. Please discuss with provider.    CONTINUE these medications which have NOT CHANGED   Details  ondansetron (ZOFRAN) 4 MG tablet Take 1 tablet (4 mg total) by mouth every 6 (six) hours. Qty: 12 tablet, Refills: 0  STOP taking these medications     ibuprofen (ADVIL,MOTRIN) 200 MG tablet      omeprazole (PRILOSEC) 10 MG capsule      pravastatin (PRAVACHOL) 40 MG tablet      Alum & Mag Hydroxide-Simeth (GI COCKTAIL) SUSP suspension          Aspirin prescribed at discharge?  Yes High Intensity Statin Prescribed? (Lipitor 40-94m or Crestor 20-415m: Yes Beta Blocker Prescribed? Yes For EF <40%, was ACEI/ARB Prescribed? No: N/A ADP Receptor Inhibitor Prescribed? (i.e. Plavix etc.-Includes Medically Managed Patients): Yes For EF <40%, Aldosterone Inhibitor Prescribed? No: N/A Was EF assessed during THIS  hospitalization? Yes Was Cardiac Rehab II ordered? (Included Medically managed Patients): Yes   Outstanding Labs/Studies   Needs follow up cbc and bmet at hospital follow up appointment.   Duration of Discharge Encounter   Greater than 30 minutes including physician time.  Signed, RyRise MuPA-C [facility] Pager: ([phone]/01/2017, 3:31 PM

## 2016-08-06 NOTE — Progress Notes (Signed)
Feels much better. Walked a lap and a half around the unit. Wants to go home. Will arrange early f/u, make sure he can get his antiplatelet meds without interruption.  Sanda Klein, MD, [facility] [phone] office [phone] pager

## 2016-08-08 ENCOUNTER — Other Ambulatory Visit: Payer: Self-pay | Admitting: Cardiology

## 2016-08-08 ENCOUNTER — Telehealth: Payer: Self-pay

## 2016-08-08 ENCOUNTER — Ambulatory Visit (INDEPENDENT_AMBULATORY_CARE_PROVIDER_SITE_OTHER): Payer: Commercial Managed Care - HMO | Admitting: Physician Assistant

## 2016-08-08 ENCOUNTER — Telehealth: Payer: Self-pay | Admitting: Physician Assistant

## 2016-08-08 ENCOUNTER — Encounter: Payer: Self-pay | Admitting: Physician Assistant

## 2016-08-08 ENCOUNTER — Telehealth: Payer: Self-pay | Admitting: Cardiology

## 2016-08-08 VITALS — BP 126/88 | HR 73 | Ht 73.0 in | Wt 217.6 lb

## 2016-08-08 DIAGNOSIS — R110 Nausea: Secondary | ICD-10-CM | POA: Diagnosis not present

## 2016-08-08 DIAGNOSIS — R002 Palpitations: Secondary | ICD-10-CM

## 2016-08-08 DIAGNOSIS — I472 Ventricular tachycardia: Secondary | ICD-10-CM

## 2016-08-08 DIAGNOSIS — I255 Ischemic cardiomyopathy: Secondary | ICD-10-CM | POA: Diagnosis not present

## 2016-08-08 DIAGNOSIS — R531 Weakness: Secondary | ICD-10-CM

## 2016-08-08 DIAGNOSIS — I4729 Other ventricular tachycardia: Secondary | ICD-10-CM

## 2016-08-08 DIAGNOSIS — I213 ST elevation (STEMI) myocardial infarction of unspecified site: Secondary | ICD-10-CM

## 2016-08-08 MED ORDER — ONDANSETRON HCL 4 MG PO TABS: 4.0000 mg | ORAL_TABLET | Freq: Four times a day (QID) | ORAL | 0 refills | Status: DC

## 2016-08-08 NOTE — Patient Instructions (Addendum)
Your physician recommends that you schedule a follow-up appointment in: 2 MONTHS WITH DR [place]  WE WILL CALL YOU IF THERE IS ANYTHING CONCERNING ON RESULTS OF EVENT MONITOR AND NEED A SOONER APPOINTMENT     Thank you for choosing [facility] at [employer], [place], PA-C

## 2016-08-08 NOTE — Telephone Encounter (Signed)
lmtcb  PLEASE TRANSFER PT CALL TO [facility] IF PT CALLS BACK.  WILL CALL AGAIN TOMORROW

## 2016-08-08 NOTE — Telephone Encounter (Signed)
New Message: ° ° °Pt returning phone call °

## 2016-08-08 NOTE — Telephone Encounter (Signed)
Paged by answering service. Patient woke up with dizziness and nausea. He took his morning medications. Denies chest pain, shortness of breath, palpitations, orthopnea, PND or edema. His dizziness does not gets works with standing from sitting position. Mild dizziness with walking. He neither has  blood pressure cuff to check his blood pressure nor Zofran even though discharged med listed to continue zofran. His BP was low during admission.   Advised to call office after 10am and will see him in clinic today. Need to check BP. IF develops CP, dyspnea or worsening dizziness go to ER/Urgent care.   Wife and patient aggress to plan.

## 2016-08-08 NOTE — Telephone Encounter (Signed)
New message   Pt wife verbalized pt has NO CHEST PAIN he is just nauseated  and dizzy

## 2016-08-08 NOTE — Telephone Encounter (Signed)
  Spoke to wife. They had called in earlier (see Vin's on-call note from this AM) regarding pt dizziness and nausea, which she notes had started after he took his morning meds. He is recently discharged from the hospital w med changes. She does not have a baseline BP reading or other vitals. At PA guidance, was instructed to call for appt. I scheduled to see Terry Wright today at 2pm to assess further. I did make sure appt info was clarified and they are aware of office location. Caller voiced acknowledgment and thanks.

## 2016-08-08 NOTE — Telephone Encounter (Signed)
I handled appt scheduling this AM. Pt to be set up for monitor, was he was called for this?

## 2016-08-08 NOTE — Telephone Encounter (Signed)
Patient was seen today by [name].  She requests 30 day cardiac event monitor but there is not an order in EPIC.  It does not appear the patient was scheduled for a 30 day cardiac event monitor.  I would recommend we have this monitor applied ASAP.  We will be able to fit him in on tomorrows schedule. Cardiac event monitor, DX: dizziness, nausea, h/o recent NSVT, STEMI, complicated by VF/VT arrest.

## 2016-08-08 NOTE — Progress Notes (Signed)
Cardiology Office Note   Date:  08/08/2016   ID:  Terry Wright, DOB 1955/11/20, MRN [id number]  PCP:  Haywood Pao, MD  Cardiologist:  Dr Katz>>Dr Nolon Stalls, PA-C   Chief Complaint  Patient presents with  . Follow-up  . Shortness of Breath    when laying down.  . Chest Pain    none.  . Dizziness    today with nausea.    History of Present Illness: Terry Wright is a 61 y.o. male with a history of HLD, former tobacco abuse quitting in 2015 and FH CAD.  Admit 0000000 w/ STEMI, complicated by VF/VT arrest w/ defib x 2. S/p Synergy DES CFX, EF 35-45% at cath, 40-45% by echo. Vent ectopy seen w/ short runs NSVT. Soft BP limited med titration  Terry Wright presents for post-hospital follow up.  Since d/c, he has done pretty well. He has not had chest pain. Has been generally feeling well. He was sore from the CPR, but rx w/ Tylenol. He has been walking some, not too much. His legs were getting weak after sleeping. Legs not swelling but he cannot lie flat to sleep. He was able to eventually drop off, but does much better propped up. He has nasal stuffiness, thinks he is mouth-breathing more than he used to.   Today, pt felt fine on waking. Was drinking coffee (decaf). He had taken rx and just eaten breakfast. Sudden onset of nausea and being light-headed. He was having more palpitations than usual. He did not ck BP/HR. He called the office and appt made. Sx resolved after about an hour or 2. He would have episodes like this in the hospital, but it is not clear if that was associated with high BP or arrhythmia. Currently, he feels fine.  He and his wife are improving their diet. He is doing the walking and wants to go to rehab.   Past Medical History:  Diagnosis Date  . DVT (deep venous thrombosis) (HCC)    Right upper arm DVT on 2 occasions in the remote past  . Dyslipidemia    Patient mentions triglycerides specifically  . Ejection  fraction    LV function normal, echo, February, 2010  . GERD (gastroesophageal reflux disease)   . Hypertension   . Mitral regurgitation    Mild, echo, February, 2010  . Palpitations    Mild in the past  . Statin intolerance    Felt poorly after Lipitor, Crestor, TriCor  . Tobacco abuse     Past Surgical History:  Procedure Laterality Date  . CARDIAC CATHETERIZATION N/A 08/04/2016   Procedure: Left Heart Cath and Coronary Angiography;  Surgeon: Leonie Man, MD;  Location: [facility];  Service: Cardiovascular;  Laterality: N/A;  . CARDIAC CATHETERIZATION N/A 08/04/2016   Procedure: Coronary Stent Intervention;  Surgeon: Leonie Man, MD;  Location: [facility];  Service: Cardiovascular;  Laterality: N/A;  . HERNIA REPAIR    . MR WRIST LEFT      Current Outpatient Prescriptions  Medication Sig Dispense Refill  . aspirin 81 MG chewable tablet Chew 1 tablet (81 mg total) by mouth daily. 30 tablet 11  . atorvastatin (LIPITOR) 40 MG tablet Take 1 tablet (40 mg total) by mouth daily at 6 PM. 30 tablet 11  . carvedilol (COREG) 6.25 MG tablet Take 1 tablet (6.25 mg total) by mouth 2 (two) times daily with a meal. 60 tablet 11  .  ondansetron (ZOFRAN) 4 MG tablet Take 1 tablet (4 mg total) by mouth every 6 (six) hours. 12 tablet 0  . pantoprazole (PROTONIX) 40 MG tablet Take 1 tablet (40 mg total) by mouth daily. 30 tablet 11  . ticagrelor (BRILINTA) 90 MG TABS tablet Take 1 tablet (90 mg total) by mouth 2 (two) times daily. 60 tablet 0  . ticagrelor (BRILINTA) 90 MG TABS tablet Take 1 tablet (90 mg total) by mouth 2 (two) times daily. 60 tablet 11   No current facility-administered medications for this visit.     Allergies:   Patient has no known allergies.    Social History:  The patient  reports that he has quit smoking. He has never used smokeless tobacco. He reports that he drinks alcohol. He reports that he does not use drugs.   Family History:  The patient's  family history includes Heart attack in his mother; Hypertension in his father.    ROS:  Please see the history of present illness. All other systems are reviewed and negative.    PHYSICAL EXAM: VS:  BP 126/88   Pulse 73   Ht 6\' 1"  (1.854 m)   Wt 217 lb 9.6 oz (98.7 kg)   BMI 28.71 kg/m  , BMI Body mass index is 28.71 kg/m. GEN: Well nourished, well developed, male in no acute distress  HEENT: normal for age  Neck: no JVD, no carotid bruit, no masses Cardiac: RRR; no murmur, no rubs, or gallops Respiratory:  clear to auscultation bilaterally, normal work of breathing GI: soft, nontender, nondistended, + BS MS: no deformity or atrophy; no edema; distal pulses are 2+ in all 4 extremities   Skin: warm and dry, no rash Neuro:  Strength and sensation are intact Psych: euthymic mood, full affect   EKG:  EKG is ordered today. The ekg ordered today demonstrates SR, HR 75, T wave inversions in I & AVL, c/w lateral MI. Minimal change from 08/05/2016  ECHO: 08/05/2016 - Left ventricle: The cavity size was normal. There was mild   concentric hypertrophy. Systolic function was mildly to   moderately reduced. The estimated ejection fraction was in the   range of 40% to 45%. Severe hypokinesis of the   basal-midinferolateral myocardium; consistent with infarction in   the distribution of the left circumflex coronary artery. Doppler   parameters are consistent with abnormal left ventricular   relaxation (grade 1 diastolic dysfunction). - Aortic valve: Valve area (VTI): 3.71 cm^2. Valve area (Vmax):   3.97 cm^2. Valve area (Vmean): 3.4 cm^2.  CATH: 08/04/2016 SYNERGY DES 4X16  Post-Intervention Diagram       Recent Labs: 08/04/2016: ALT 51 08/05/2016: BUN 13; Creatinine, Ser 0.91; Hemoglobin 15.9; Platelets 202; Potassium 3.7; Sodium 138    Lipid Panel    Component Value Date/Time   CHOL 181 12/20/2011 1000   TRIG 180.0 (H) 12/20/2011 1000   HDL 35.30 (L) 12/20/2011 1000    CHOLHDL 5 12/20/2011 1000   VLDL 36.0 12/20/2011 1000   LDLCALC 110 (H) 12/20/2011 1000   LDLDIRECT 159.1 06/05/2011 0948     Wt Readings from Last 3 Encounters:  08/08/16 217 lb 9.6 oz (98.7 kg)  08/04/16 220 lb 0.3 oz (99.8 kg)  12/20/11 226 lb (102.5 kg)     Other studies Reviewed: Additional studies/ records that were reviewed today include: Hospital records and testing.  ASSESSMENT AND PLAN:  1.  CFX STEMI, subsequent care: pt doing well from an ischemic standpoint, continue ASA, Brilinta, high-dose  statin, BB.   2. Episode nausea and weakness, ?with palpitations: Vent ectopy and short runs NSVT seen on telemetry when in-hospital. Unclear if this is 2nd to hypotension 2nd rx or ectopy. Requested pt take Coreg and other meds after meals. Will also get event monitor to ck for arrhythmia. He will ck BP/HR if he gets more sx.  3. ICM: He is on BB, BP too soft for ACE. Add that once monitor completed, if BP will tolerate. Pt is to watch the sodium in his diet. Contact us if he gets more SOB or edema. No Lasix right now, hopefully, EF will improve.   Current medicines are reviewed at length with the patient today.  The patient does not have concerns regarding medicines.  The following changes have been made:  no change  Labs/ tests ordered today include:  Event Monitor   Disposition:   FU with Dr Ellyn Hack  Signed, [name], PA-C  08/08/2016 3:57 PM    [place] Phone: [phone]; Fax: [phone]  This note was written with the assistance of speech recognition software. Please excuse any transcriptional errors.

## 2016-08-09 ENCOUNTER — Other Ambulatory Visit: Payer: Self-pay

## 2016-08-09 ENCOUNTER — Other Ambulatory Visit: Payer: Self-pay | Admitting: Physician Assistant

## 2016-08-09 ENCOUNTER — Ambulatory Visit (INDEPENDENT_AMBULATORY_CARE_PROVIDER_SITE_OTHER): Payer: Commercial Managed Care - HMO

## 2016-08-09 ENCOUNTER — Telehealth: Payer: Self-pay | Admitting: Cardiology

## 2016-08-09 DIAGNOSIS — I4729 Other ventricular tachycardia: Secondary | ICD-10-CM | POA: Insufficient documentation

## 2016-08-09 DIAGNOSIS — I472 Ventricular tachycardia: Secondary | ICD-10-CM

## 2016-08-09 DIAGNOSIS — R002 Palpitations: Secondary | ICD-10-CM | POA: Diagnosis not present

## 2016-08-09 MED ORDER — ONDANSETRON HCL 4 MG PO TABS: 4.0000 mg | ORAL_TABLET | Freq: Four times a day (QID) | ORAL | 0 refills | Status: DC

## 2016-08-09 NOTE — Telephone Encounter (Signed)
°  New Prob  Was seen by [name] yesterday. Calling to verify when pt is clear to drive again. Wife states they forgot to bring this up during appointment. Please call.

## 2016-08-09 NOTE — Addendum Note (Signed)
Addended by: Waylan Rocher on: 08/09/2016 04:40 PM   Modules accepted: Orders

## 2016-08-09 NOTE — Telephone Encounter (Signed)
There is no documentation in EPIC that a cardiac event monitor was applied and the patient is not on the monitor status report.  Please reach out to staff who applied this monitor to assure this is done.

## 2016-08-09 NOTE — Progress Notes (Signed)
Monitor was applied at office visit. Order written.

## 2016-08-09 NOTE — Telephone Encounter (Signed)
Returned call-request to speak with patient (no DPR on file, advised to fill out next visit).    Advised patient I would send message to [name] PA to verify when ok to drive.  Advised to hold off on driving at this time until further notice (per OV note 1/10 concerned for arrhythmias/hypotension).    Pt verbalized understanding.    Routed to PA for recommendations.

## 2016-08-09 NOTE — Telephone Encounter (Signed)
Monitor was applied during office visit.  I have written an order.

## 2016-08-09 NOTE — Telephone Encounter (Signed)
Spoke with pt

## 2016-08-10 NOTE — Addendum Note (Signed)
Addended by: Waylan Rocher on: 08/10/2016 10:02 AM   Modules accepted: Orders

## 2016-08-10 NOTE — Telephone Encounter (Signed)
No driving for 2 weeks. Then can drive if no more episodes. Don't have to restrict driving further since he did not have syncope.

## 2016-08-11 ENCOUNTER — Telehealth (HOSPITAL_COMMUNITY): Payer: Self-pay | Admitting: Internal Medicine

## 2016-08-11 NOTE — Telephone Encounter (Signed)
Returned call-made aware of PA recommendations:  Terry Georgia, PA-C   7:55 PM  Note    No driving for 2 weeks. Then can drive if no more episodes. Don't have to restrict driving further since he did not have syncope.     Verbalized understanding and advised to call with further questions/concerns.

## 2016-08-11 NOTE — Telephone Encounter (Signed)
S/w Herbie Baltimore at [facility] verifying [employer].  $25 co-pay, no Deductible, $3,000 out of pocket per year with 36 visits a year, reference #[id number].Marland Kitchen... KJ

## 2016-08-18 ENCOUNTER — Telehealth: Payer: Self-pay | Admitting: Cardiology

## 2016-08-18 NOTE — Telephone Encounter (Signed)
New Message      Pt states he had heart attack 2 weeks ago, he is having a lot of nasal congestion is there any OTC medication he can take that will not interfere with his medicines

## 2016-08-18 NOTE — Telephone Encounter (Signed)
Returned call to patient-made aware he can try nasal saline and humidifier.  Advised to avoid dextromethorphan with OTC medications.  Pt aware and verbalized understanding.

## 2016-08-21 ENCOUNTER — Telehealth: Payer: Self-pay | Admitting: Cardiology

## 2016-08-21 ENCOUNTER — Telehealth (HOSPITAL_COMMUNITY): Payer: Self-pay | Admitting: *Deleted

## 2016-08-21 NOTE — Telephone Encounter (Signed)
-----   Message from Lonn Georgia, PA-C sent at 08/21/2016  3:51 PM EST ----- Regarding: RE: cardiac rehab Yes, please. Thanks  ----- Message ----- From: Magda Kiel, RN Sent: 08/21/2016   9:24 AM To: Lonn Georgia, PA-C Subject: cardiac rehab                                  Good morning Leonia Reader saw Mr Tirey in the office post hospital for DES on 08/08/2016. Mr Bozard is wearing an event monitor for the next 30 days  Is it okay for Mr Noyola to proceed with exercise and orientation tomorrow?  Thanks for your input!  Verdis Frederickson

## 2016-08-21 NOTE — Telephone Encounter (Signed)
Returned call to [place] at cardiac rehab.She stated she already spoke to [facility] PA.

## 2016-08-21 NOTE — Telephone Encounter (Signed)
Terry Wright with [facility] calling to say she left an in basket message for Terry Wright and would like for someone in Triage to ask Terry Wright to reply to her message before she leaves

## 2016-08-22 ENCOUNTER — Encounter (HOSPITAL_COMMUNITY): Payer: Self-pay

## 2016-08-22 ENCOUNTER — Encounter (HOSPITAL_COMMUNITY)
Admission: RE | Admit: 2016-08-22 | Discharge: 2016-08-22 | Disposition: A | Discharge status: Home / Self Care | Payer: Commercial Managed Care - HMO | Source: Ambulatory Visit | Attending: Cardiology | Admitting: Cardiology

## 2016-08-22 VITALS — BP 104/64 | HR 77 | Ht 73.25 in | Wt 212.5 lb

## 2016-08-22 DIAGNOSIS — I2121 ST elevation (STEMI) myocardial infarction involving left circumflex coronary artery: Secondary | ICD-10-CM

## 2016-08-22 DIAGNOSIS — Z955 Presence of coronary angioplasty implant and graft: Secondary | ICD-10-CM | POA: Diagnosis not present

## 2016-08-22 HISTORY — DX: Hyperlipidemia, unspecified: E78.5

## 2016-08-22 HISTORY — DX: ST elevation (STEMI) myocardial infarction involving left circumflex coronary artery: I21.21

## 2016-08-22 NOTE — Progress Notes (Signed)
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  aspirin 81 MG chewable tablet, Chew 1 tablet (81 mg total) by mouth daily., Disp: 30 tablet, Rfl: 11 .  atorvastatin (LIPITOR) 40 MG tablet, Take 1 tablet (40 mg total) by mouth daily at 6 PM., Disp: 30 tablet, Rfl: 11 .  carvedilol (COREG) 6.25 MG tablet, Take 1 tablet (6.25 mg total) by mouth 2 (two) times daily with a meal., Disp: 60 tablet, Rfl: 11 .  ondansetron (ZOFRAN) 4 MG tablet, Take 1 tablet (4 mg total) by mouth every 6 (six) hours., Disp: 12 tablet, Rfl: 0 .  pantoprazole (PROTONIX) 40 MG tablet, Take 1 tablet (40 mg total) by mouth daily., Disp: 30 tablet, Rfl: 11 .  ticagrelor (BRILINTA) 90 MG TABS tablet, Take 1 tablet (90 mg total) by mouth 2 (two) times daily., Disp: 60 tablet, Rfl: 11  Past Medical History: Past Medical History:  Diagnosis Date  . Coronary artery disease   . DVT (deep venous thrombosis) (HCC)    Right upper arm DVT on 2 occasions in the remote past  . Dyslipidemia    Patient mentions triglycerides specifically  . Ejection fraction    LV function normal, echo, February, 2010  . GERD (gastroesophageal reflux disease)   . Hyperlipidemia   . Hypertension   . Mitral regurgitation    Mild, echo, February, 2010  . Palpitations    Mild in the past   . Statin intolerance    Felt poorly after Lipitor, Crestor, TriCor  . STEMI involving left circumflex coronary artery ([place])   . Tobacco abuse     Tobacco Use: History  Smoking Status  . Former Smoker  . Packs/day: 1.00  . Years: 40.00  . Quit date: 2015  Smokeless Tobacco  . Never Used    Labs: Recent Review Flowsheet Data    Labs for ITP Cardiac and Pulmonary Rehab Latest Ref Rng & Units 04/25/2011 06/05/2011 12/20/2011 08/04/2016   Cholestrol 0 - 200 mg/dL 218(H) 234(H) 181 -   LDLCALC 0 - 99 mg/dL - - 110(H) -   LDLDIRECT mg/dL 127.2 159.1 - -   HDL >39.00 mg/dL 36.10(L) 41.70 35.30(L) -   Trlycerides 0.0 - 149.0 mg/dL 193.0(H) 149.0 180.0(H) -   TCO2 0 - 100 mmol/L - - - 28      Capillary Blood Glucose: No results found for: GLUCAP   Exercise Target Goals: Date: 08/22/16  Exercise Program Goal: Individual exercise prescription set with THRR, safety & activity barriers. Participant demonstrates ability to understand and report RPE using BORG scale, to self-measure pulse accurately, and to acknowledge the importance of the exercise prescription.  Exercise Prescription Goal: Starting with aerobic activity 30 plus minutes a day, 3 days per week for initial exercise prescription. Provide home exercise prescription and guidelines that participant acknowledges understanding prior to discharge.  Activity Barriers & Risk  Stratification:     Activity Barriers & Cardiac Risk Stratification - 08/22/16 1138      Activity Barriers & Cardiac Risk Stratification   Activity Barriers Other (comment);Deconditioning   Comments L shoulder pain, L cyst on wrist, hernia surgery   Cardiac Risk Stratification High      6 Minute Walk:     6 Minute Walk    Row Name 08/22/16 1141         6 Minute Walk   Phase Initial     Distance 1625 feet     Walk Time 6 minutes     # of Rest Breaks 0     MPH 3.1     METS 3.8     RPE 9     VO2 Peak 13.3     Symptoms No     Resting HR 77  bpm     Resting BP 104/64     Max Ex. HR 85 bpm     Max Ex. BP 118/70     2 Minute Post BP 98/60        Initial Exercise Prescription:     Initial Exercise Prescription - 08/22/16 1100      Date of Initial Exercise RX and Referring Provider   Date 08/22/16   Referring Provider [name] MD     Treadmill   MPH 2.8   Grade 1   Minutes 10   METs 3.53     Bike   Level 0.8   Minutes 10   METs 2.59     NuStep   Level 3   Minutes 10   METs 2     Prescription Details   Frequency (times per week) 3   Duration Progress to 30 minutes of continuous aerobic without signs/symptoms of physical distress     Intensity   THRR 40-80% of Max Heartrate 64-127   Ratings of Perceived Exertion 11-13   Perceived Dyspnea 0-4     Progression   Progression Continue progressive overload as per policy without signs/symptoms or physical distress.     Resistance Training   Training Prescription Yes   Weight 3lbs   Reps 10-12      Perform Capillary Blood Glucose checks as needed.  Exercise Prescription Changes:   Exercise Comments:   Discharge Exercise Prescription (Final Exercise Prescription Changes):   Nutrition:  Target Goals: Understanding of nutrition guidelines, daily intake of sodium 1500mg , cholesterol 200mg , calories 30% from fat and 7% or less from saturated fats, daily to have 5 or more servings of fruits and vegetables.  Biometrics:     Pre Biometrics - 08/22/16 1353      Pre Biometrics   Height 6' 1.25" (1.861 m)   Weight 212 lb 8.4 oz (96.4 kg)   BMI (Calculated) 27.9       Nutrition Therapy Plan and Nutrition Goals:   Nutrition Discharge: Nutrition Scores:   Nutrition Goals Re-Evaluation:   Psychosocial: Target Goals: Acknowledge presence or absence of depression, maximize coping skills, provide positive support system. Participant is able to verbalize types and ability to use techniques and skills needed for reducing stress and  depression.  Initial Review & Psychosocial Screening:     Initial Psych Review & Screening - 08/22/16 [place]? Yes   Comments brief assessment reveals no further needs or interventions at this time     Barriers   Psychosocial barriers to participate in program There  are no identifiable barriers or psychosocial needs.     Screening Interventions   Interventions Encouraged to exercise      Quality of Life Scores:     Quality of Life - 08/22/16 1135      Quality of Life Scores   Health/Function Pre 24.75 %   Socioeconomic Pre 28.33 %   Psych/Spiritual Pre 23.57 %   Family Pre 27 %   GLOBAL Pre 25.47 %      PHQ-9: Recent Review Flowsheet Data    There is no flowsheet data to display.      Psychosocial Evaluation and Intervention:   Psychosocial Re-Evaluation:   Vocational Rehabilitation: Provide vocational rehab assistance to qualifying candidates.   Vocational Rehab Evaluation & Intervention:     Vocational Rehab - 08/22/16 1348      Initial Vocational Rehab Evaluation & Intervention   Assessment shows need for Vocational Rehabilitation No  Mr Kerkstra is retired and does not need vocational rehab at this time.      Education: Education Goals: Education classes will be provided on a weekly basis, covering required topics. Participant will state understanding/return demonstration of topics presented.  Learning Barriers/Preferences:     Learning Barriers/Preferences - 08/22/16 1140      Learning Barriers/Preferences   Learning Barriers None   Learning Preferences Audio;Pictoral;Video      Education Topics: Count Your Pulse:  -Group instruction provided by verbal instruction, demonstration, patient participation and written materials to support subject.  Instructors address importance of being able to find your pulse and how to count your pulse when at home without a heart monitor.  Patients get hands on  experience counting their pulse with staff help and individually.   Heart Attack, Angina, and Risk Factor Modification:  -Group instruction provided by verbal instruction, video, and written materials to support subject.  Instructors address signs and symptoms of angina and heart attacks.    Also discuss risk factors for heart disease and how to make changes to improve heart health risk factors.   Functional Fitness:  -Group instruction provided by verbal instruction, demonstration, patient participation, and written materials to support subject.  Instructors address safety measures for doing things around the house.  Discuss how to get up and down off the floor, how to pick things up properly, how to safely get out of a chair without assistance, and balance training.   Meditation and Mindfulness:  -Group instruction provided by verbal instruction, patient participation, and written materials to support subject.  Instructor addresses importance of mindfulness and meditation practice to help reduce stress and improve awareness.  Instructor also leads participants through a meditation exercise.    Stretching for Flexibility and Mobility:  -Group instruction provided by verbal instruction, patient participation, and written materials to support subject.  Instructors lead participants through series of stretches that are designed to increase flexibility thus improving mobility.  These stretches are additional exercise for major muscle groups that are typically performed during regular warm up and cool down.   Hands Only CPR Anytime:  -Group instruction provided by verbal instruction, video, patient participation and written materials to support subject.  Instructors co-teach with AHA video for hands only CPR.  Participants get hands on experience with mannequins.   Nutrition I class: Heart Healthy Eating:  -Group instruction provided by PowerPoint slides, verbal discussion, and written materials  to support subject matter. The instructor gives an explanation and review of the Therapeutic Lifestyle Changes diet recommendations, which includes a discussion on lipid  goals, dietary fat, sodium, fiber, plant stanol/sterol esters, sugar, and the components of a well-balanced, healthy diet.   Nutrition II class: Lifestyle Skills:  -Group instruction provided by PowerPoint slides, verbal discussion, and written materials to support subject matter. The instructor gives an explanation and review of label reading, grocery shopping for heart health, heart healthy recipe modifications, and ways to make healthier choices when eating out.   Diabetes Question & Answer:  -Group instruction provided by PowerPoint slides, verbal discussion, and written materials to support subject matter. The instructor gives an explanation and review of diabetes co-morbidities, pre- and post-prandial blood glucose goals, pre-exercise blood glucose goals, signs, symptoms, and treatment of hypoglycemia and hyperglycemia, and foot care basics.   Diabetes Blitz:  -Group instruction provided by PowerPoint slides, verbal discussion, and written materials to support subject matter. The instructor gives an explanation and review of the physiology behind type 1 and type 2 diabetes, diabetes medications and rational behind using different medications, pre- and post-prandial blood glucose recommendations and Hemoglobin A1c goals, diabetes diet, and exercise including blood glucose guidelines for exercising safely.    Portion Distortion:  -Group instruction provided by PowerPoint slides, verbal discussion, written materials, and food models to support subject matter. The instructor gives an explanation of serving size versus portion size, changes in portions sizes over the last 20 years, and what consists of a serving from each food group.   Stress Management:  -Group instruction provided by verbal instruction, video, and written  materials to support subject matter.  Instructors review role of stress in heart disease and how to cope with stress positively.     Exercising on Your Own:  -Group instruction provided by verbal instruction, power point, and written materials to support subject.  Instructors discuss benefits of exercise, components of exercise, frequency and intensity of exercise, and end points for exercise.  Also discuss use of nitroglycerin and activating EMS.  Review options of places to exercise outside of rehab.  Review guidelines for sex with heart disease.   Cardiac Drugs I:  -Group instruction provided by verbal instruction and written materials to support subject.  Instructor reviews cardiac drug classes: antiplatelets, anticoagulants, beta blockers, and statins.  Instructor discusses reasons, side effects, and lifestyle considerations for each drug class.   Cardiac Drugs II:  -Group instruction provided by verbal instruction and written materials to support subject.  Instructor reviews cardiac drug classes: angiotensin converting enzyme inhibitors (ACE-I), angiotensin II receptor blockers (ARBs), nitrates, and calcium channel blockers.  Instructor discusses reasons, side effects, and lifestyle considerations for each drug class.   Anatomy and Physiology of the Circulatory System:  -Group instruction provided by verbal instruction, video, and written materials to support subject.  Reviews functional anatomy of heart, how it relates to various diagnoses, and what role the heart plays in the overall system.   Knowledge Questionnaire Score:     Knowledge Questionnaire Score - 08/22/16 1134      Knowledge Questionnaire Score   Pre Score 21/24      Core Components/Risk Factors/Patient Goals at Admission:     Personal Goals and Risk Factors at Admission - 08/22/16 0831      Core Components/Risk Factors/Patient Goals on Admission    Weight Management Weight Maintenance   Intervention Weight  Management: Develop a combined nutrition and exercise program designed to reach desired caloric intake, while maintaining appropriate intake of nutrient and fiber, sodium and fats, and appropriate energy expenditure required for the weight goal.;Weight Management: Provide education and  appropriate resources to help participant work on and attain dietary goals.;Weight Management/Obesity: Establish reasonable short term and long term weight goals.;Obesity: Provide education and appropriate resources to help participant work on and attain dietary goals.   Expected Outcomes Short Term: Continue to assess and modify interventions until short term weight is achieved;Weight Maintenance: Understanding of the daily nutrition guidelines, which includes 25-35% calories from fat, 7% or less cal from saturated fats, less than 200mg  cholesterol, less than 1.5gm of sodium, & 5 or more servings of fruits and vegetables daily;Understanding recommendations for meals to include 15-35% energy as protein, 25-35% energy from fat, 35-60% energy from carbohydrates, less than 200mg  of dietary cholesterol, 20-35 gm of total fiber daily;Understanding of distribution of calorie intake throughout the day with the consumption of 4-5 meals/snacks   Increase Strength and Stamina Yes   Intervention Provide advice, education, support and counseling about physical activity/exercise needs.;Develop an individualized exercise prescription for aerobic and resistive training based on initial evaluation findings, risk stratification, comorbidities and participant's personal goals.   Expected Outcomes Achievement of increased cardiorespiratory fitness and enhanced flexibility, muscular endurance and strength shown through measurements of functional capacity and personal statement of participant.   Hypertension Yes   Intervention Provide education on lifestyle modifcations including regular physical activity/exercise, weight management, moderate sodium  restriction and increased consumption of fresh fruit, vegetables, and low fat dairy, alcohol moderation, and smoking cessation.;Monitor prescription use compliance.   Expected Outcomes Short Term: Continued assessment and intervention until BP is < 140/8mm HG in hypertensive participants. < 130/85mm HG in hypertensive participants with diabetes, heart failure or chronic kidney disease.;Long Term: Maintenance of blood pressure at goal levels.   Lipids Yes   Intervention Provide education and support for participant on nutrition & aerobic/resistive exercise along with prescribed medications to achieve LDL 70mg , HDL >40mg .   Expected Outcomes Short Term: Participant states understanding of desired cholesterol values and is compliant with medications prescribed. Participant is following exercise prescription and nutrition guidelines.;Long Term: Cholesterol controlled with medications as prescribed, with individualized exercise RX and with personalized nutrition plan. Value goals: LDL < 70mg , HDL > 40 mg.   Personal Goal Other Yes   Personal Goal Get back to somewhat normal self and get healthy. Back to walking and playing golf (after shoulder surgery)    Intervention Provide exercise programming to assist with improving cardiorespiratory fitness and exercise tolerance. Provide cardiac education classes to improve confidence and awareness with living with CVD.   Expected Outcomes Pt will be able to walk and return to golf (post shoulder surgery) Pt will also improve confidence with living with  CVD      Core Components/Risk Factors/Patient Goals Review:    Core Components/Risk Factors/Patient Goals at Discharge (Final Review):    ITP Comments:     ITP Comments    Row Name 08/22/16 0825           ITP Comments Dr. Fransico Him, Medical Director          Comments: Juanda Crumble attended orientation from 0800 to 1000  to review rules and guidelines for program. Completed 6 minute walk test,  Intitial ITP, and exercise prescription.  VSS. Telemetry-Sinus rhythm.  Asymptomatic.Barnet Pall, RN,BSN 08/22/2016 2:02 PM

## 2016-08-22 NOTE — Progress Notes (Signed)
Cardiac Rehab Medication Review by a Pharmacist  Does the patient  feel that his/her medications are working for him/her?  yes  Has the patient been experiencing any side effects to the medications prescribed?  yes  Does the patient measure his/her own blood pressure or blood glucose at home?  no   Does the patient have any problems obtaining medications due to transportation or finances?   no  Understanding of regimen: good Understanding of indications: good Potential of compliance: excellent    Pharmacist comments: Terry Wright is a 61 year old male who presents to cardiac rehab for the first time today. He seems to have a good understanding of his new medication regimen and understands the importance of his medications. He did say that he had some dizziness with his carvedilol the first time he took it, which did resolve. He has since been taking it with food and has not had issues. Recommended to him to take his blood pressure/heart rate if this happens in the future and call his provider if it does not go away.   Terry Wright, PharmD Acute Care Pharmacy Resident  Pager: [phone] 08/22/2016       Terry Wright 08/22/2016 8:03 AM

## 2016-08-25 ENCOUNTER — Telehealth: Payer: Self-pay | Admitting: Cardiology

## 2016-08-25 NOTE — Telephone Encounter (Signed)
New Message     Please call Terry Wright is on heart monitor when was this suppose to end? When can he take it off ? Is he allowed to Drive again?

## 2016-08-25 NOTE — Telephone Encounter (Signed)
Patient needing clarification on when he can resume driving, and how long he needs to wear his monitor.  Note that he had heart attack 3 weeks ago, instructions given not to drive were, per patient understanding, given in context of giving him time to recover from hospitalization. He denies episodes of syncope. OK to resume driving?  Regarding event monitor, review notes. Cannot determine if monitor was ordered for 2 weeks or 30 days. He wore this home the date of office visit 08/08/16.  Informed pt I would call back when I got further information from provider.

## 2016-08-25 NOTE — Telephone Encounter (Signed)
Event monitor is for 30 days. He can start driving now.

## 2016-08-28 ENCOUNTER — Encounter (HOSPITAL_COMMUNITY)
Admission: RE | Admit: 2016-08-28 | Discharge: 2016-08-28 | Disposition: A | Discharge status: Home / Self Care | Payer: Commercial Managed Care - HMO | Source: Ambulatory Visit | Attending: Cardiology | Admitting: Cardiology

## 2016-08-28 DIAGNOSIS — Z955 Presence of coronary angioplasty implant and graft: Secondary | ICD-10-CM | POA: Diagnosis not present

## 2016-08-28 DIAGNOSIS — I2121 ST elevation (STEMI) myocardial infarction involving left circumflex coronary artery: Secondary | ICD-10-CM

## 2016-08-28 NOTE — Telephone Encounter (Signed)
Returned phone call. Recommendations communicated w caller who verbalized understanding and thanks.

## 2016-08-28 NOTE — Progress Notes (Signed)
Daily Session Note  Patient Details  Name: Terry Wright MRN: [id number] Date of Birth: 12-15-1955 Referring Provider:   Flowsheet Row CARDIAC REHAB PHASE II ORIENTATION from 08/22/2016 in [place]  Referring Provider  [name] MD      Encounter Date: 08/28/2016  Check In:     Session Check In - 08/28/16 1514      Check-In   Location [facility]   Staff Present Cleda Mccreedy, MS, Exercise Physiologist;Amber Fair, MS, ACSM RCEP, Exercise Physiologist;Legrand Lasser Venetia Maxon, RN, BSN   Supervising physician immediately available to respond to emergencies [facility] immediately available   Physician(s) Dr. Posey Pronto   Medication changes reported     No   Fall or balance concerns reported    No   Warm-up and Cool-down Performed as group-led instruction   Resistance Training Performed Yes   VAD Patient? No     Pain Assessment   Currently in Pain? No/denies   Multiple Pain Sites No      Capillary Blood Glucose: No results found for this or any previous visit (from the past 24 hour(s)).   Goals Met:  Exercise tolerated well  Goals Unmet:  Not Applicable  Comments: Terry Wright started cardiac rehab today.  Pt tolerated light exercise without difficulty. VSS, telemetry-Sinus rhythm  Medication list reconciled. Pt denies barriers to medicaiton compliance.  PSYCHOSOCIAL ASSESSMENT:  PHQ-0. Pt exhibits positive coping skills, hopeful outlook with supportive family. No psychosocial needs identified at this time, no psychosocial interventions necessary.    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   Pt oriented to exercise equipment and routine.    Understanding verbalized. Terry Wright is wearing a 30 day event monitor.  No arrhythmias noted today.Will continue to monitor the patient throughout  the program.Deshonna Trnka Venetia Maxon, RN,BSN 08/28/2016 4:17 PM   Dr. Fransico Him is Medical  Director for Cardiac Rehab at [facility].

## 2016-08-30 ENCOUNTER — Encounter (HOSPITAL_COMMUNITY)
Admission: RE | Admit: 2016-08-30 | Discharge: 2016-08-30 | Disposition: A | Discharge status: Home / Self Care | Payer: Commercial Managed Care - HMO | Source: Ambulatory Visit | Attending: Cardiology | Admitting: Cardiology

## 2016-08-30 DIAGNOSIS — Z955 Presence of coronary angioplasty implant and graft: Secondary | ICD-10-CM

## 2016-08-30 DIAGNOSIS — I2121 ST elevation (STEMI) myocardial infarction involving left circumflex coronary artery: Secondary | ICD-10-CM

## 2016-09-01 ENCOUNTER — Encounter (HOSPITAL_COMMUNITY)
Admission: RE | Admit: 2016-09-01 | Discharge: 2016-09-01 | Disposition: A | Discharge status: Home / Self Care | Payer: Commercial Managed Care - HMO | Source: Ambulatory Visit | Attending: Cardiology | Admitting: Cardiology

## 2016-09-01 DIAGNOSIS — Z955 Presence of coronary angioplasty implant and graft: Secondary | ICD-10-CM | POA: Diagnosis not present

## 2016-09-01 DIAGNOSIS — I2121 ST elevation (STEMI) myocardial infarction involving left circumflex coronary artery: Secondary | ICD-10-CM

## 2016-09-01 NOTE — Progress Notes (Signed)
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  aspirin 81 MG chewable tablet, Chew 1 tablet (81 mg total) by mouth daily., Disp: 30 tablet, Rfl: 11 .  atorvastatin (LIPITOR) 40 MG tablet, Take 1 tablet (40 mg total) by mouth daily at 6 PM., Disp: 30 tablet, Rfl: 11 .  carvedilol (COREG) 6.25 MG tablet, Take 1 tablet (6.25 mg total) by mouth 2 (two) times daily with a meal., Disp: 60 tablet, Rfl: 11 .  ondansetron (ZOFRAN) 4 MG tablet, Take 1 tablet (4 mg total) by mouth every 6 (six) hours., Disp: 12 tablet, Rfl: 0 .  pantoprazole (PROTONIX) 40 MG tablet, Take 1 tablet (40 mg total) by mouth daily., Disp: 30 tablet, Rfl: 11 .  ticagrelor (BRILINTA) 90 MG TABS tablet, Take 1 tablet (90 mg total) by mouth 2 (two) times daily., Disp: 60 tablet, Rfl: 11  Past Medical History: Past Medical History:  Diagnosis Date  . Coronary artery disease   . DVT (deep venous thrombosis) (HCC)    Right upper arm DVT on 2 occasions in the remote past  . Dyslipidemia    Patient mentions triglycerides specifically  . Ejection fraction    LV function normal, echo, February, 2010  . GERD (gastroesophageal reflux disease)   . Hyperlipidemia   . Hypertension   . Mitral regurgitation    Mild, echo, February, 2010  . Palpitations    Mild in the past   . Statin intolerance    Felt poorly after Lipitor, Crestor, TriCor  . STEMI involving left circumflex coronary artery ([place])   . Tobacco abuse     Tobacco Use: History  Smoking Status  . Former Smoker  . Packs/day: 1.00  . Years: 40.00  . Quit date: 2015  Smokeless Tobacco  . Never Used    Labs: Recent Review Flowsheet Data    Labs for ITP Cardiac and Pulmonary Rehab Latest Ref Rng & Units 04/25/2011 06/05/2011 12/20/2011 08/04/2016   Cholestrol 0 - 200 mg/dL 218(H) 234(H) 181 -   LDLCALC 0 - 99 mg/dL - - 110(H) -   LDLDIRECT mg/dL 127.2 159.1 - -   HDL >39.00 mg/dL 36.10(L) 41.70 35.30(L) -   Trlycerides 0.0 - 149.0 mg/dL 193.0(H) 149.0 180.0(H) -   TCO2 0 - 100 mmol/L - - - 28      Capillary Blood Glucose: No results found for: GLUCAP   Exercise Target Goals:    Exercise Program Goal: Individual exercise prescription set with THRR, safety & activity barriers. Participant demonstrates ability to understand and report RPE using BORG scale, to self-measure pulse accurately, and to acknowledge the importance of the exercise prescription.  Exercise Prescription Goal: Starting with aerobic activity 30 plus minutes a day, 3 days per week for initial exercise prescription. Provide home exercise prescription and guidelines that participant acknowledges understanding prior to discharge.  Activity Barriers & Risk  Stratification:     Activity Barriers & Cardiac Risk Stratification - 08/22/16 1138      Activity Barriers & Cardiac Risk Stratification   Activity Barriers Other (comment);Deconditioning   Comments L shoulder pain, L cyst on wrist, hernia surgery   Cardiac Risk Stratification High      6 Minute Walk:     6 Minute Walk    Row Name 08/22/16 1141         6 Minute Walk   Phase Initial     Distance 1625 feet     Walk Time 6 minutes     # of Rest Breaks 0     MPH 3.1     METS 3.8     RPE 9     VO2 Peak 13.3     Symptoms No     Resting HR 77 bpm      Resting BP 104/64     Max Ex. HR 85 bpm     Max Ex. BP 118/70     2 Minute Post BP 98/60        Initial Exercise Prescription:     Initial Exercise Prescription - 08/22/16 1100      Date of Initial Exercise RX and Referring Provider   Date 08/22/16   Referring Provider [name] MD     Treadmill   MPH 2.8   Grade 1   Minutes 10   METs 3.53     Bike   Level 0.8   Minutes 10   METs 2.59     NuStep   Level 3   Minutes 10   METs 2     Prescription Details   Frequency (times per week) 3   Duration Progress to 30 minutes of continuous aerobic without signs/symptoms of physical distress     Intensity   THRR 40-80% of Max Heartrate 64-127   Ratings of Perceived Exertion 11-13   Perceived Dyspnea 0-4     Progression   Progression Continue progressive overload as per policy without signs/symptoms or physical distress.     Resistance Training   Training Prescription Yes   Weight 3lbs   Reps 10-12      Perform Capillary Blood Glucose checks as needed.  Exercise Prescription Changes:   Exercise Comments:   Discharge Exercise Prescription (Final Exercise Prescription Changes):   Nutrition:  Target Goals: Understanding of nutrition guidelines, daily intake of sodium 1500mg , cholesterol 200mg , calories 30% from fat and 7% or less from saturated fats, daily to have 5 or more servings of fruits and vegetables.  Biometrics:     Pre Biometrics - 08/22/16 1353      Pre Biometrics   Height 6' 1.25" (1.861 m)   Weight 212 lb 8.4 oz (96.4 kg)   BMI (Calculated) 27.9       Nutrition Therapy Plan and Nutrition Goals:     Nutrition Therapy & Goals - 08/28/16 1104      Nutrition Therapy   Diet Therapeutic Lifestyle Changes     Personal Nutrition Goals   Personal Goal #1 Wt maintenance during Cardiac Rehab per pt     Intervention Plan   Intervention Prescribe, educate and counsel regarding individualized specific dietary modifications aiming towards  targeted core components such as weight, hypertension, lipid management, diabetes, heart failure and other comorbidities.   Expected Outcomes Short Term Goal: Understand basic principles of dietary content, such as calories, fat, sodium, cholesterol and nutrients.;Long Term Goal: Adherence to prescribed nutrition plan.  Nutrition Discharge: Nutrition Scores:     Nutrition Assessments - 08/28/16 1104      MEDFICTS Scores   Pre Score 13      Nutrition Goals Re-Evaluation:   Psychosocial: Target Goals: Acknowledge presence or absence of depression, maximize coping skills, provide positive support system. Participant is able to verbalize types and ability to use techniques and skills needed for reducing stress and depression.  Initial Review & Psychosocial Screening:     Initial Psych Review & Screening - 08/22/16 [place]? Yes   Comments brief assessment reveals no further needs or interventions at this time     Barriers   Psychosocial barriers to participate in program There are no identifiable barriers or psychosocial needs.     Screening Interventions   Interventions Encouraged to exercise      Quality of Life Scores:     Quality of Life - 08/22/16 1135      Quality of Life Scores   Health/Function Pre 24.75 %   Socioeconomic Pre 28.33 %   Psych/Spiritual Pre 23.57 %   Family Pre 27 %   GLOBAL Pre 25.47 %      PHQ-9: Recent Review Flowsheet Data    Depression screen [facility] 2/9 08/28/2016   Decreased Interest 0   Down, Depressed, Hopeless 0   PHQ - 2 Score 0      Psychosocial Evaluation and Intervention:   Psychosocial Re-Evaluation:   Vocational Rehabilitation: Provide vocational rehab assistance to qualifying candidates.   Vocational Rehab Evaluation & Intervention:     Vocational Rehab - 08/22/16 1348      Initial Vocational Rehab Evaluation & Intervention   Assessment shows need for Vocational  Rehabilitation No  Mr Clarida is retired and does not need vocational rehab at this time.      Education: Education Goals: Education classes will be provided on a weekly basis, covering required topics. Participant will state understanding/return demonstration of topics presented.  Learning Barriers/Preferences:     Learning Barriers/Preferences - 08/22/16 1140      Learning Barriers/Preferences   Learning Barriers None   Learning Preferences Audio;Pictoral;Video      Education Topics: Count Your Pulse:  -Group instruction provided by verbal instruction, demonstration, patient participation and written materials to support subject.  Instructors address importance of being able to find your pulse and how to count your pulse when at home without a heart monitor.  Patients get hands on experience counting their pulse with staff help and individually.   Heart Attack, Angina, and Risk Factor Modification:  -Group instruction provided by verbal instruction, video, and written materials to support subject.  Instructors address signs and symptoms of angina and heart attacks.    Also discuss risk factors for heart disease and how to make changes to improve heart health risk factors.   Functional Fitness:  -Group instruction provided by verbal instruction, demonstration, patient participation, and written materials to support subject.  Instructors address safety measures for doing things around the house.  Discuss how to get up and down off the floor, how to pick things up properly, how to safely get out of a chair without assistance, and balance training.   Meditation and Mindfulness:  -Group instruction provided by verbal instruction, patient participation, and written materials to support subject.  Instructor addresses importance of mindfulness and meditation practice to help reduce stress and improve awareness.  Instructor also leads participants through a meditation exercise.  Stretching for Flexibility and Mobility:  -Group instruction provided by verbal instruction, patient participation, and written materials to support subject.  Instructors lead participants through series of stretches that are designed to increase flexibility thus improving mobility.  These stretches are additional exercise for major muscle groups that are typically performed during regular warm up and cool down.   Hands Only CPR Anytime:  -Group instruction provided by verbal instruction, video, patient participation and written materials to support subject.  Instructors co-teach with AHA video for hands only CPR.  Participants get hands on experience with mannequins.   Nutrition I class: Heart Healthy Eating:  -Group instruction provided by PowerPoint slides, verbal discussion, and written materials to support subject matter. The instructor gives an explanation and review of the Therapeutic Lifestyle Changes diet recommendations, which includes a discussion on lipid goals, dietary fat, sodium, fiber, plant stanol/sterol esters, sugar, and the components of a well-balanced, healthy diet.   Nutrition II class: Lifestyle Skills:  -Group instruction provided by PowerPoint slides, verbal discussion, and written materials to support subject matter. The instructor gives an explanation and review of label reading, grocery shopping for heart health, heart healthy recipe modifications, and ways to make healthier choices when eating out.   Diabetes Question & Answer:  -Group instruction provided by PowerPoint slides, verbal discussion, and written materials to support subject matter. The instructor gives an explanation and review of diabetes co-morbidities, pre- and post-prandial blood glucose goals, pre-exercise blood glucose goals, signs, symptoms, and treatment of hypoglycemia and hyperglycemia, and foot care basics.   Diabetes Blitz:  -Group instruction provided by PowerPoint slides, verbal  discussion, and written materials to support subject matter. The instructor gives an explanation and review of the physiology behind type 1 and type 2 diabetes, diabetes medications and rational behind using different medications, pre- and post-prandial blood glucose recommendations and Hemoglobin A1c goals, diabetes diet, and exercise including blood glucose guidelines for exercising safely.    Portion Distortion:  -Group instruction provided by PowerPoint slides, verbal discussion, written materials, and food models to support subject matter. The instructor gives an explanation of serving size versus portion size, changes in portions sizes over the last 20 years, and what consists of a serving from each food group.   Stress Management:  -Group instruction provided by verbal instruction, video, and written materials to support subject matter.  Instructors review role of stress in heart disease and how to cope with stress positively.     Exercising on Your Own:  -Group instruction provided by verbal instruction, power point, and written materials to support subject.  Instructors discuss benefits of exercise, components of exercise, frequency and intensity of exercise, and end points for exercise.  Also discuss use of nitroglycerin and activating EMS.  Review options of places to exercise outside of rehab.  Review guidelines for sex with heart disease. Flowsheet Row CARDIAC REHAB PHASE II EXERCISE from 08/30/2016 in [place]  Date  08/30/16  Educator  Cleda Mccreedy  Instruction Review Code  2- meets goals/outcomes      Cardiac Drugs I:  -Group instruction provided by verbal instruction and written materials to support subject.  Instructor reviews cardiac drug classes: antiplatelets, anticoagulants, beta blockers, and statins.  Instructor discusses reasons, side effects, and lifestyle considerations for each drug class.   Cardiac Drugs II:  -Group  instruction provided by verbal instruction and written materials to support subject.  Instructor reviews cardiac drug classes: angiotensin converting enzyme inhibitors (ACE-I), angiotensin II receptor  blockers (ARBs), nitrates, and calcium channel blockers.  Instructor discusses reasons, side effects, and lifestyle considerations for each drug class.   Anatomy and Physiology of the Circulatory System:  -Group instruction provided by verbal instruction, video, and written materials to support subject.  Reviews functional anatomy of heart, how it relates to various diagnoses, and what role the heart plays in the overall system.   Knowledge Questionnaire Score:     Knowledge Questionnaire Score - 08/22/16 1134      Knowledge Questionnaire Score   Pre Score 21/24      Core Components/Risk Factors/Patient Goals at Admission:     Personal Goals and Risk Factors at Admission - 08/22/16 0831      Core Components/Risk Factors/Patient Goals on Admission    Weight Management Weight Maintenance   Intervention Weight Management: Develop a combined nutrition and exercise program designed to reach desired caloric intake, while maintaining appropriate intake of nutrient and fiber, sodium and fats, and appropriate energy expenditure required for the weight goal.;Weight Management: Provide education and appropriate resources to help participant work on and attain dietary goals.;Weight Management/Obesity: Establish reasonable short term and long term weight goals.;Obesity: Provide education and appropriate resources to help participant work on and attain dietary goals.   Expected Outcomes Short Term: Continue to assess and modify interventions until short term weight is achieved;Weight Maintenance: Understanding of the daily nutrition guidelines, which includes 25-35% calories from fat, 7% or less cal from saturated fats, less than 200mg  cholesterol, less than 1.5gm of sodium, & 5 or more servings of fruits  and vegetables daily;Understanding recommendations for meals to include 15-35% energy as protein, 25-35% energy from fat, 35-60% energy from carbohydrates, less than 200mg  of dietary cholesterol, 20-35 gm of total fiber daily;Understanding of distribution of calorie intake throughout the day with the consumption of 4-5 meals/snacks   Increase Strength and Stamina Yes   Intervention Provide advice, education, support and counseling about physical activity/exercise needs.;Develop an individualized exercise prescription for aerobic and resistive training based on initial evaluation findings, risk stratification, comorbidities and participant's personal goals.   Expected Outcomes Achievement of increased cardiorespiratory fitness and enhanced flexibility, muscular endurance and strength shown through measurements of functional capacity and personal statement of participant.   Hypertension Yes   Intervention Provide education on lifestyle modifcations including regular physical activity/exercise, weight management, moderate sodium restriction and increased consumption of fresh fruit, vegetables, and low fat dairy, alcohol moderation, and smoking cessation.;Monitor prescription use compliance.   Expected Outcomes Short Term: Continued assessment and intervention until BP is < 140/5mm HG in hypertensive participants. < 130/9mm HG in hypertensive participants with diabetes, heart failure or chronic kidney disease.;Long Term: Maintenance of blood pressure at goal levels.   Lipids Yes   Intervention Provide education and support for participant on nutrition & aerobic/resistive exercise along with prescribed medications to achieve LDL 70mg , HDL >40mg .   Expected Outcomes Short Term: Participant states understanding of desired cholesterol values and is compliant with medications prescribed. Participant is following exercise prescription and nutrition guidelines.;Long Term: Cholesterol controlled with medications as  prescribed, with individualized exercise RX and with personalized nutrition plan. Value goals: LDL < 70mg , HDL > 40 mg.   Personal Goal Other Yes   Personal Goal Get back to somewhat normal self and get healthy. Back to walking and playing golf (after shoulder surgery)    Intervention Provide exercise programming to assist with improving cardiorespiratory fitness and exercise tolerance. Provide cardiac education classes to improve confidence and awareness with living with  CVD.   Expected Outcomes Pt will be able to walk and return to golf (post shoulder surgery) Pt will also improve confidence with living with  CVD      Core Components/Risk Factors/Patient Goals Review:    Core Components/Risk Factors/Patient Goals at Discharge (Final Review):    ITP Comments:     ITP Comments    Row Name 08/22/16 0825           ITP Comments Dr. Fransico Him, Medical Director          Comments  [name] is off to a good start after completing 3 sessions  Recommend continued exercise and life style modification education including  stress management and relaxation techniques to decrease cardiac risk profile. Barnet Pall, RN,BSN 09/01/2016 9:05 AM

## 2016-09-04 ENCOUNTER — Telehealth: Payer: Self-pay | Admitting: Cardiology

## 2016-09-04 ENCOUNTER — Encounter (HOSPITAL_COMMUNITY)
Admission: RE | Admit: 2016-09-04 | Discharge: 2016-09-04 | Disposition: A | Discharge status: Home / Self Care | Payer: Commercial Managed Care - HMO | Source: Ambulatory Visit | Attending: Cardiology | Admitting: Cardiology

## 2016-09-04 DIAGNOSIS — Z955 Presence of coronary angioplasty implant and graft: Secondary | ICD-10-CM

## 2016-09-04 DIAGNOSIS — I2121 ST elevation (STEMI) myocardial infarction involving left circumflex coronary artery: Secondary | ICD-10-CM

## 2016-09-04 NOTE — Telephone Encounter (Signed)
Spoke to wife, advised 30 day monitoring preferred, contact company to make sure dates of wear are acknowledged, call if new questions.

## 2016-09-04 NOTE — Telephone Encounter (Signed)
New Message   Pt wife calling regarding heart monitor. Pt phone broke, and didn't have monitor for the three days. Wants to know if he needs to wear monitor additional 3 days or if he still needs to stop monitor on 2/9. Requesting call back.

## 2016-09-06 ENCOUNTER — Encounter (HOSPITAL_COMMUNITY)
Admission: RE | Admit: 2016-09-06 | Discharge: 2016-09-06 | Disposition: A | Discharge status: Home / Self Care | Payer: Commercial Managed Care - HMO | Source: Ambulatory Visit | Attending: Cardiology | Admitting: Cardiology

## 2016-09-06 DIAGNOSIS — Z955 Presence of coronary angioplasty implant and graft: Secondary | ICD-10-CM | POA: Diagnosis not present

## 2016-09-06 DIAGNOSIS — I2121 ST elevation (STEMI) myocardial infarction involving left circumflex coronary artery: Secondary | ICD-10-CM

## 2016-09-06 NOTE — Progress Notes (Signed)
Reviewed home exercise program with pt.  Discussed mode/frequency of exercise, THRR, RPE scale and weather conditions when exercising outdoors.  Also discussed signs and symptoms and when to call Dr./911.  Pt verbalized understanding.  Cleda Mccreedy, [place] 09/06/2016 1621

## 2016-09-07 LAB — FECAL OCCULT BLOOD, GUAIAC: Fecal Occult Blood: NEGATIVE

## 2016-09-08 ENCOUNTER — Telehealth: Payer: Self-pay | Admitting: Cardiology

## 2016-09-08 ENCOUNTER — Encounter (HOSPITAL_COMMUNITY)
Admission: RE | Admit: 2016-09-08 | Discharge: 2016-09-08 | Disposition: A | Discharge status: Home / Self Care | Payer: Commercial Managed Care - HMO | Source: Ambulatory Visit | Attending: Cardiology | Admitting: Cardiology

## 2016-09-08 DIAGNOSIS — Z955 Presence of coronary angioplasty implant and graft: Secondary | ICD-10-CM

## 2016-09-08 DIAGNOSIS — I2121 ST elevation (STEMI) myocardial infarction involving left circumflex coronary artery: Secondary | ICD-10-CM

## 2016-09-08 NOTE — Progress Notes (Signed)
Increased PVC's and bigeminey noted today while on the treadmill today at cardiac rehab. Vital signs stable. Patient asymptomatic. Cecilie Kicks FNP-C called and notified. [name] says he is taking his medications as prescribed. No new order received. No complaints voiced during the remainder of exercise.Will fax exercise flow sheets to Dr. Allison Quarry office for review with today's ECG tracings.Barnet Pall, RN,BSN 09/08/2016 4:53 PM

## 2016-09-08 NOTE — Telephone Encounter (Signed)
In Cardiac rehab pt had big PVCs, he was aware of them and they occurred while on the treadmill.  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  Will notify Dr. Ellyn Hack.

## 2016-09-11 ENCOUNTER — Encounter (HOSPITAL_COMMUNITY)
Admission: RE | Admit: 2016-09-11 | Discharge: 2016-09-11 | Disposition: A | Discharge status: Home / Self Care | Payer: Commercial Managed Care - HMO | Source: Ambulatory Visit | Attending: Cardiology | Admitting: Cardiology

## 2016-09-11 DIAGNOSIS — Z955 Presence of coronary angioplasty implant and graft: Secondary | ICD-10-CM

## 2016-09-11 DIAGNOSIS — I2121 ST elevation (STEMI) myocardial infarction involving left circumflex coronary artery: Secondary | ICD-10-CM

## 2016-09-13 ENCOUNTER — Encounter (HOSPITAL_COMMUNITY)
Admission: RE | Admit: 2016-09-13 | Discharge: 2016-09-13 | Disposition: A | Discharge status: Home / Self Care | Payer: Commercial Managed Care - HMO | Source: Ambulatory Visit | Attending: Cardiology | Admitting: Cardiology

## 2016-09-13 DIAGNOSIS — I2121 ST elevation (STEMI) myocardial infarction involving left circumflex coronary artery: Secondary | ICD-10-CM

## 2016-09-13 DIAGNOSIS — Z955 Presence of coronary angioplasty implant and graft: Secondary | ICD-10-CM | POA: Diagnosis not present

## 2016-09-15 ENCOUNTER — Encounter (HOSPITAL_COMMUNITY)
Admission: RE | Admit: 2016-09-15 | Discharge: 2016-09-15 | Disposition: A | Discharge status: Home / Self Care | Payer: Commercial Managed Care - HMO | Source: Ambulatory Visit | Attending: Cardiology | Admitting: Cardiology

## 2016-09-15 DIAGNOSIS — Z955 Presence of coronary angioplasty implant and graft: Secondary | ICD-10-CM

## 2016-09-15 DIAGNOSIS — I2121 ST elevation (STEMI) myocardial infarction involving left circumflex coronary artery: Secondary | ICD-10-CM

## 2016-09-18 ENCOUNTER — Encounter (HOSPITAL_COMMUNITY)
Admission: RE | Admit: 2016-09-18 | Discharge: 2016-09-18 | Disposition: A | Discharge status: Home / Self Care | Payer: Commercial Managed Care - HMO | Source: Ambulatory Visit | Attending: Cardiology | Admitting: Cardiology

## 2016-09-18 DIAGNOSIS — Z955 Presence of coronary angioplasty implant and graft: Secondary | ICD-10-CM

## 2016-09-18 DIAGNOSIS — I2121 ST elevation (STEMI) myocardial infarction involving left circumflex coronary artery: Secondary | ICD-10-CM

## 2016-09-18 NOTE — Progress Notes (Signed)
Terry Wright 61 y.o. male Nutrition Note Spoke with pt.  Nutrition Survey reviewed with pt. Pt is following Step 2 of the Therapeutic Lifestyle Changes diet. Pt expressed understanding of the information reviewed. Pt aware of nutrition education classes offered and plans on attending nutrition classes. No results found for: HGBA1C Wt Readings from Last 3 Encounters:  08/22/16 212 lb 8.4 oz (96.4 kg)  08/08/16 217 lb 9.6 oz (98.7 kg)  08/04/16 220 lb 0.3 oz (99.8 kg)   Nutrition Diagnosis ? Food-and nutrition-related knowledge deficit related to lack of exposure to information as related to diagnosis of: ? CVD ?  Nutrition Intervention ? Benefits of adopting Therapeutic Lifestyle Changes discussed when Medficts reviewed. ? Pt to attend the Portion Distortion class ? Pt to attend the  ? Nutrition I class                      ? Nutrition II class - met ? Continue client-centered nutrition education by RD, as part of interdisciplinary care.  Goal(s) ? Pt to describe the benefit of including fruits, vegetables, whole grains, and low-fat dairy products in a heart healthy meal plan.  Monitor and Evaluate progress toward nutrition goal with team.  Derek Mound, M.Ed, RD, LDN, CDE 09/18/2016 3:52 PM

## 2016-09-20 ENCOUNTER — Encounter (HOSPITAL_COMMUNITY)
Admission: RE | Admit: 2016-09-20 | Discharge: 2016-09-20 | Disposition: A | Discharge status: Home / Self Care | Payer: Commercial Managed Care - HMO | Source: Ambulatory Visit | Attending: Cardiology | Admitting: Cardiology

## 2016-09-20 DIAGNOSIS — I2121 ST elevation (STEMI) myocardial infarction involving left circumflex coronary artery: Secondary | ICD-10-CM

## 2016-09-20 DIAGNOSIS — Z955 Presence of coronary angioplasty implant and graft: Secondary | ICD-10-CM

## 2016-09-21 ENCOUNTER — Ambulatory Visit (INDEPENDENT_AMBULATORY_CARE_PROVIDER_SITE_OTHER): Payer: Commercial Managed Care - HMO | Admitting: Family Medicine

## 2016-09-21 ENCOUNTER — Encounter: Payer: Self-pay | Admitting: Family Medicine

## 2016-09-21 VITALS — BP 120/78 | HR 71 | Temp 97.9°F | Ht 73.25 in | Wt 213.2 lb

## 2016-09-21 DIAGNOSIS — I2510 Atherosclerotic heart disease of native coronary artery without angina pectoris: Secondary | ICD-10-CM | POA: Diagnosis not present

## 2016-09-21 DIAGNOSIS — I10 Essential (primary) hypertension: Secondary | ICD-10-CM | POA: Diagnosis not present

## 2016-09-21 DIAGNOSIS — Z9861 Coronary angioplasty status: Secondary | ICD-10-CM

## 2016-09-21 DIAGNOSIS — F410 Panic disorder [episodic paroxysmal anxiety] without agoraphobia: Secondary | ICD-10-CM

## 2016-09-21 DIAGNOSIS — Z86718 Personal history of other venous thrombosis and embolism: Secondary | ICD-10-CM

## 2016-09-21 DIAGNOSIS — G4700 Insomnia, unspecified: Secondary | ICD-10-CM | POA: Insufficient documentation

## 2016-09-21 DIAGNOSIS — I25119 Atherosclerotic heart disease of native coronary artery with unspecified angina pectoris: Secondary | ICD-10-CM | POA: Insufficient documentation

## 2016-09-21 DIAGNOSIS — R739 Hyperglycemia, unspecified: Secondary | ICD-10-CM | POA: Diagnosis not present

## 2016-09-21 DIAGNOSIS — E785 Hyperlipidemia, unspecified: Secondary | ICD-10-CM

## 2016-09-21 DIAGNOSIS — Z87891 Personal history of nicotine dependence: Secondary | ICD-10-CM

## 2016-09-21 DIAGNOSIS — E1159 Type 2 diabetes mellitus with other circulatory complications: Secondary | ICD-10-CM | POA: Insufficient documentation

## 2016-09-21 NOTE — Assessment & Plan Note (Signed)
Quit smoking 2015. 40 pack years. 64 AAA screen. Lung cancer screening likely will opt in. Likely refer lung cancer screening at CPE

## 2016-09-21 NOTE — Assessment & Plan Note (Signed)
S: [employer] 30 years- ? PTSD. Lorazepam 0.5mg  daily- mainly at night- mind races, gets very worked up A/P: we discussed possible SSRI in long run given monotherapy with benzodiazepine. He is open to this- discussed perhaps after out of cardiac rehab- perhaps at 6 month follow up

## 2016-09-21 NOTE — Assessment & Plan Note (Signed)
S: Dr. Joaquin Bend 08/04/16. STEMI and cardiac arrest. 40-45% EF right after MI.  circumflex artery 100% stenosed. Compliant with Aspirin 81, atorvastatin 40mg , coreg 6.25 BID, Brilinta 90mg  BID (1 year). Enjoying cardiac rehab A/P:  Patient is doing incredibly well after MI earlier this year. Encouraged continued cardiac rehab and compliance with above meds

## 2016-09-21 NOTE — Assessment & Plan Note (Signed)
S: suspect better controlled on atorvastatin 40mg  since MI. No myalgias.  Lab Results  Component Value Date   CHOL 181 12/20/2011   HDL 35.30 (L) 12/20/2011   LDLCALC 110 (H) 12/20/2011   LDLDIRECT 159.1 06/05/2011   TRIG 180.0 (H) 12/20/2011   CHOLHDL 5 12/20/2011   A/P: prior statin intolerance noted including on atorvastatin. Will monitor- likely update lipids next visit with target <70 LDL

## 2016-09-21 NOTE — Progress Notes (Signed)
Phone: [phone]  Subjective:  Patient presents today to establish care.  Prior patient of Dr. Osborne Casco (referred from a family friend who sees Dr. Elease Hashimoto). Chief complaint-noted.   See problem oriented charting  The following were reviewed and entered/updated in epic: Past Medical History:  Diagnosis Date  . Coronary artery disease   . DVT (deep venous thrombosis) (HCC)    Right upper arm DVT on 2 occasions in the remote past  . Dyslipidemia    Patient mentions triglycerides specifically  . Ejection fraction    LV function normal, echo, February, 2010  . GERD (gastroesophageal reflux disease)   . Hyperlipidemia   . Hypertension   . Mitral regurgitation    Mild, echo, February, 2010  . Palpitations    Mild in the past  . Statin intolerance    Felt poorly after Lipitor, Crestor, TriCor  . STEMI involving left circumflex coronary artery ([place])   . Tobacco abuse    Patient Active Problem List   Diagnosis Date Noted  . CAD (coronary artery disease) 09/21/2016    Priority: High  . Panic attacks 09/21/2016    Priority: High  . History of DVT (deep vein thrombosis)     Priority: High  . Former smoker     Priority: High  . Dyslipidemia     Priority: Medium  . Hypertension     Priority: Medium  . Statin intolerance     Priority: Medium  . Mitral regurgitation     Priority: Low  . GERD (gastroesophageal reflux disease)     Priority: Low  . Palpitations     Priority: Low   Past Surgical History:  Procedure Laterality Date  . CARDIAC CATHETERIZATION N/A 08/04/2016   Procedure: Left Heart Cath and Coronary Angiography;  Surgeon: Leonie Man, MD;  Location: [facility];  Service: Cardiovascular;  Laterality: N/A;  . CARDIAC CATHETERIZATION N/A 08/04/2016   Procedure: Coronary Stent Intervention;  Surgeon: Leonie Man, MD;  Location: [facility];  Service: Cardiovascular;  Laterality: N/A;  . GANGLION CYST EXCISION     oct 2017  . HERNIA REPAIR       umbilical hernia 2017 oct    Family History  Problem Relation Age of Onset  . Heart attack Mother     x2- lived into 40s  . Hypertension Father     lived into 56s  . Other Brother     killed in [place]    Medications- reviewed and updated Current Outpatient Prescriptions  Medication Sig Dispense Refill  . aspirin 81 MG chewable tablet Chew 1 tablet (81 mg total) by mouth daily. 30 tablet 11  . atorvastatin (LIPITOR) 40 MG tablet Take 1 tablet (40 mg total) by mouth daily at 6 PM. 30 tablet 11  . carvedilol (COREG) 6.25 MG tablet Take 1 tablet (6.25 mg total) by mouth 2 (two) times daily with a meal. 60 tablet 11  . LORazepam (ATIVAN) 0.5 MG tablet Take 0.5 mg by mouth every 8 (eight) hours.    . pantoprazole (PROTONIX) 40 MG tablet Take 1 tablet (40 mg total) by mouth daily. 30 tablet 11  . ticagrelor (BRILINTA) 90 MG TABS tablet Take 1 tablet (90 mg total) by mouth 2 (two) times daily. 60 tablet 11   No current facility-administered medications for this visit.     Allergies-reviewed and updated No Known Allergies  Social History   Social History  . Marital status: Married    Spouse name: N/A  .  Number of children: N/A  . Years of education: N/A   Social History Main Topics  . Smoking status: Former Smoker    Packs/day: 1.00    Years: 40.00    Quit date: 2015  . Smokeless tobacco: Never Used  . Alcohol use 4.2 oz/week    7 Glasses of wine per week     Comment: occasional  . Drug use: No  . Sexual activity: Not Asked   Other Topics Concern  . None   Social History Narrative   Married 10 years in 2018. 2 step kids- 70 and 78. 3 kids but he does not have contact with. No grandkids      Retired Theatre stage manager.       Hobbies: golf (needs shoulder replacement though, working in the yard, working out- was doing this regularly even before MI- walking/treadmill    ROS--Full ROS was completed Review of Systems  Constitutional: Negative for chills and fever.   HENT: Negative for ear discharge, ear pain and nosebleeds.   Eyes: Negative for blurred vision and double vision.  Respiratory: Negative for cough and shortness of breath.   Cardiovascular: Negative for chest pain and palpitations.  Gastrointestinal: Negative for heartburn and nausea.  Genitourinary: Negative for dysuria and urgency.  Musculoskeletal: Positive for joint pain. Negative for falls.  Skin: Negative for itching and rash.  Neurological: Negative for dizziness and headaches.  Endo/Heme/Allergies: Negative for polydipsia. Does not bruise/bleed easily.  Psychiatric/Behavioral: Negative for hallucinations and substance abuse.   Objective: BP 120/78 (BP Location: Left Arm, Patient Position: Sitting, Cuff Size: Normal)   Pulse 71   Temp 97.9 F (36.6 C) (Oral)   Ht 6' 1.25" (1.861 m)   Wt 213 lb 3.2 oz (96.7 kg)   BMI 27.94 kg/m  Gen: NAD, resting comfortably HEENT: Mucous membranes are moist. Oropharynx normal. TM normal. Eyes: sclera and lids normal, PERRLA Neck: no thyromegaly, no cervical lymphadenopathy CV: RRR no murmurs rubs or gallops Lungs: CTAB no crackles, wheeze, rhonchi Abdomen: soft/nontender/nondistended/normal bowel sounds. No rebound or guarding.  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 Lorazepam 0.5mg  daily- mainly at night- mind races, gets very worked up A/P: we discussed possible SSRI in long run given monotherapy with benzodiazepine. He is open to this- discussed perhaps after out of cardiac rehab- perhaps at 6 month follow up   CAD (coronary artery disease) S: Dr. Joaquin Bend 08/04/16. STEMI and cardiac arrest. 40-45% EF right after MI.  circumflex artery 100% stenosed. Compliant with Aspirin 81, atorvastatin 40mg , coreg 6.25 BID, Brilinta 90mg  BID (1 year). Enjoying cardiac rehab A/P:  Patient is doing incredibly  well after MI earlier this year. Encouraged continued cardiac rehab and compliance with above meds  Hypertension S: controlled on Coreg 6.25mg  BID.  BP Readings from Last 3 Encounters:  09/21/16 120/78  08/22/16 104/64  08/08/16 126/88  A/P:Continue current medications with excellent control   Dyslipidemia S: suspect better controlled on atorvastatin 40mg  since MI. No myalgias.  Lab Results  Component Value Date   CHOL 181 12/20/2011   HDL 35.30 (L) 12/20/2011   LDLCALC 110 (H) 12/20/2011   LDLDIRECT 159.1 06/05/2011   TRIG 180.0 (H) 12/20/2011   CHOLHDL 5 12/20/2011   A/P: prior statin intolerance noted including on atorvastatin. Will monitor- likely update lipids next visit with target <70 LDL   History of DVT (deep vein  thrombosis) 1986 and 1988. Right upper arm DVT on 2 occasions in the remote past. One time Was told due to high repetition playing basketball. Once was fall related apparently as well.Was told no long term anticoagulation.  Veiny upper right shoulder. Will monitor closely as both issues have been right upper arm (atypical) but fortunately none in over 30 years  Former smoker Quit smoking 2015. 40 pack years. 64 AAA screen. Lung cancer screening likely will opt in. Likely refer lung cancer screening at [facility]   Verbal 6 months  Will abstract the following after review of records Tdap 10/28/15  Re[facility] out by [facility] for ROI on colonoscopy- appeared 2012 and likely nearing time for repeat  Meds ordered this encounter  Medications  . LORazepam (ATIVAN) 0.5 MG tablet    Sig: Take 0.5 mg by mouth every 8 (eight) hours.   Return precautions advised.  Garret Reddish, MD

## 2016-09-21 NOTE — Assessment & Plan Note (Signed)
S: controlled on Coreg 6.25mg  BID.  BP Readings from Last 3 Encounters:  09/21/16 120/78  08/22/16 104/64  08/08/16 126/88  A/P:Continue current medications with excellent control

## 2016-09-21 NOTE — Patient Instructions (Signed)
Thrilled to meet you and thrilled you are alive!   I will review records and let you know if we need any more interventions  Would not recommend any changes today

## 2016-09-21 NOTE — Progress Notes (Signed)
Pre visit review using our clinic review tool, if applicable. No additional management support is needed unless otherwise documented below in the visit note. 

## 2016-09-21 NOTE — Assessment & Plan Note (Signed)
[place]. Right upper arm DVT on 2 occasions in the remote past. One time Was told due to high repetition playing basketball. Once was fall related apparently as well.Was told no long term anticoagulation.  Veiny upper right shoulder. Will monitor closely as both issues have been right upper arm (atypical) but fortunately none in over 30 years

## 2016-09-22 ENCOUNTER — Encounter: Payer: Self-pay | Admitting: Family Medicine

## 2016-09-22 ENCOUNTER — Telehealth: Payer: Self-pay | Admitting: Cardiology

## 2016-09-22 ENCOUNTER — Encounter (HOSPITAL_COMMUNITY)
Admission: RE | Admit: 2016-09-22 | Discharge: 2016-09-22 | Disposition: A | Discharge status: Home / Self Care | Payer: Commercial Managed Care - HMO | Source: Ambulatory Visit | Attending: Cardiology | Admitting: Cardiology

## 2016-09-22 DIAGNOSIS — Z955 Presence of coronary angioplasty implant and graft: Secondary | ICD-10-CM

## 2016-09-22 DIAGNOSIS — I2121 ST elevation (STEMI) myocardial infarction involving left circumflex coronary artery: Secondary | ICD-10-CM

## 2016-09-22 NOTE — Telephone Encounter (Signed)
New Message    Pt c/o medication issue:  1. Name of Medication: LORazepam (ATIVAN) 0.5 MG tablet  2. How are you currently taking this medication (dosage and times per day)? 0.5MG  per day  3. Are you having a reaction (difficulty breathing--STAT)? NO  4. What is your medication issue? Pt wife states records at [facility] does not show that pt takes ATIVAN. Pt wife wants to know if it is OK if he takes this.

## 2016-09-22 NOTE — Progress Notes (Signed)
abstracted 

## 2016-09-22 NOTE — Telephone Encounter (Signed)
Yes; lorazepam is okay to take with cardiac medication.

## 2016-09-22 NOTE — Telephone Encounter (Signed)
Spoke to wife. Notes patient on ativan, this was prescribed in the past by PCP, just updated med list when seeing his new PCP yesterday who is in [facility] system (Dr. Yong Channel).  She wanted to make sure Ativan is  OK to take w pt's cardiac meds. Informed her I would verify w pharmD and return her call. OK to f/u next week.

## 2016-09-25 ENCOUNTER — Encounter (HOSPITAL_COMMUNITY)
Admission: RE | Admit: 2016-09-25 | Discharge: 2016-09-25 | Disposition: A | Discharge status: Home / Self Care | Payer: Commercial Managed Care - HMO | Source: Ambulatory Visit | Attending: Cardiology | Admitting: Cardiology

## 2016-09-25 DIAGNOSIS — Z955 Presence of coronary angioplasty implant and graft: Secondary | ICD-10-CM | POA: Diagnosis not present

## 2016-09-25 DIAGNOSIS — I2121 ST elevation (STEMI) myocardial infarction involving left circumflex coronary artery: Secondary | ICD-10-CM

## 2016-09-27 ENCOUNTER — Encounter (HOSPITAL_COMMUNITY)
Admission: RE | Admit: 2016-09-27 | Discharge: 2016-09-27 | Disposition: A | Discharge status: Home / Self Care | Payer: Commercial Managed Care - HMO | Source: Ambulatory Visit | Attending: Cardiology | Admitting: Cardiology

## 2016-09-27 DIAGNOSIS — I2121 ST elevation (STEMI) myocardial infarction involving left circumflex coronary artery: Secondary | ICD-10-CM

## 2016-09-27 DIAGNOSIS — Z955 Presence of coronary angioplasty implant and graft: Secondary | ICD-10-CM

## 2016-09-28 ENCOUNTER — Telehealth: Payer: Self-pay | Admitting: Cardiology

## 2016-09-28 NOTE — Progress Notes (Signed)
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  aspirin 81 MG chewable tablet, Chew 1 tablet (81 mg total) by mouth daily., Disp: 30 tablet, Rfl: 11 .  atorvastatin (LIPITOR) 40 MG tablet, Take 1 tablet (40 mg total) by mouth daily at 6 PM., Disp: 30 tablet, Rfl: 11 .  carvedilol (COREG) 6.25 MG tablet, Take 1 tablet (6.25 mg total) by mouth 2 (two) times daily with a meal., Disp: 60 tablet, Rfl: 11 .  LORazepam (ATIVAN) 0.5 MG tablet, Take 0.5 mg by mouth every 8 (eight) hours., Disp: , Rfl:  .  pantoprazole (PROTONIX) 40 MG tablet, Take 1 tablet (40 mg total) by mouth daily., Disp: 30 tablet, Rfl: 11 .  ticagrelor (BRILINTA) 90 MG TABS tablet, Take 1 tablet (90 mg total) by mouth 2 (two) times daily., Disp: 60 tablet, Rfl: 11  Past Medical History: Past Medical History:  Diagnosis Date  . Coronary artery disease   . DVT (deep venous thrombosis) (HCC)    Right upper arm DVT on 2 occasions in the remote past  . Dyslipidemia    Patient mentions triglycerides specifically  . Ejection fraction    LV function normal, echo, February, 2010  . GERD (gastroesophageal reflux disease)   . Hyperlipidemia   . Hypertension   . Mitral regurgitation    Mild, echo, February, 2010  . Palpitations    Mild in the past  . Statin  intolerance    Felt poorly after Lipitor, Crestor, TriCor  . STEMI involving left circumflex coronary artery ([place])   . Tobacco abuse     Tobacco Use: History  Smoking Status  . Former Smoker  . Packs/day: 1.00  . Years: 40.00  . Quit date: 2015  Smokeless Tobacco  . Never Used    Labs: Recent Review Flowsheet Data    Labs for ITP Cardiac and Pulmonary Rehab Latest Ref Rng & Units 04/25/2011 06/05/2011 12/20/2011 08/04/2016   Cholestrol 0 - 200 mg/dL 218(H) 234(H) 181 -   LDLCALC 0 - 99 mg/dL - - 110(H) -   LDLDIRECT mg/dL 127.2 159.1 - -   HDL >39.00 mg/dL 36.10(L) 41.70 35.30(L) -   Trlycerides 0.0 - 149.0 mg/dL 193.0(H) 149.0 180.0(H) -   TCO2 0 - 100 mmol/L - - - 28      Capillary Blood Glucose: No results found for: GLUCAP   Exercise Target Goals:    Exercise Program Goal: Individual exercise prescription set with THRR, safety & activity barriers. Participant demonstrates ability to understand and report RPE using BORG scale, to self-measure pulse accurately, and to acknowledge the importance of the exercise prescription.  Exercise Prescription Goal: Starting with aerobic activity 30 plus minutes a day, 3 days per week for initial exercise prescription. Provide home exercise prescription and guidelines that participant acknowledges understanding prior to discharge.  Activity Barriers & Risk Stratification:  Activity Barriers & Cardiac Risk Stratification - 08/22/16 1138      Activity Barriers & Cardiac Risk Stratification   Activity Barriers Other (comment);Deconditioning   Comments L shoulder pain, L cyst on wrist, hernia surgery   Cardiac Risk Stratification High      6 Minute Walk:     6 Minute Walk    Row Name 08/22/16 1141         6 Minute Walk   Phase Initial     Distance 1625 feet     Walk Time 6 minutes     # of Rest Breaks 0     MPH 3.1     METS 3.8     RPE 9     VO2 Peak 13.3     Symptoms No     Resting HR 77 bpm     Resting BP  104/64     Max Ex. HR 85 bpm     Max Ex. BP 118/70     2 Minute Post BP 98/60        Oxygen Initial Assessment:   Oxygen Re-Evaluation:   Oxygen Discharge (Final Oxygen Re-Evaluation):   Initial Exercise Prescription:     Initial Exercise Prescription - 08/22/16 1100      Date of Initial Exercise RX and Referring Provider   Date 08/22/16   Referring Provider [name] MD     Treadmill   MPH 2.8   Grade 1   Minutes 10   METs 3.53     Bike   Level 0.8   Minutes 10   METs 2.59     NuStep   Level 3   Minutes 10   METs 2     Prescription Details   Frequency (times per week) 3   Duration Progress to 30 minutes of continuous aerobic without signs/symptoms of physical distress     Intensity   THRR 40-80% of Max Heartrate 64-127   Ratings of Perceived Exertion 11-13   Perceived Dyspnea 0-4     Progression   Progression Continue progressive overload as per policy without signs/symptoms or physical distress.     Resistance Training   Training Prescription Yes   Weight 3lbs   Reps 10-12      Perform Capillary Blood Glucose checks as needed.  Exercise Prescription Changes:     Exercise Prescription Changes    Row Name 09/27/16 1400             Response to Exercise   Blood Pressure (Admit) 130/82       Blood Pressure (Exercise) 120/78       Blood Pressure (Exit) 112/82       Heart Rate (Admit) 62 bpm       Heart Rate (Exercise) 89 bpm       Heart Rate (Exit) 71 bpm       Rating of Perceived Exertion (Exercise) 11       Duration Progress to 45 minutes of aerobic exercise without signs/symptoms of physical distress       Intensity THRR unchanged         Progression   Progression Continue to progress workloads to maintain intensity without signs/symptoms of physical distress.       Average METs 3.1         Resistance Training   Training Prescription Yes       Weight 3lbs       Reps 10-15  Treadmill   MPH 3       Grade 2        Minutes 10       METs 4.12         Bike   Level 0.8       Minutes 10       METs 2.59         NuStep   Level 4       Minutes 10       METs 2.5         Home Exercise Plan   Plans to continue exercise at Home (comment)       Frequency Add 3 additional days to program exercise sessions.       Initial Home Exercises Provided 09/06/16          Exercise Comments:     Exercise Comments    Row Name 09/27/16 1414           Exercise Comments Reviewed METs and goals with pt.  Pt is doing great with exercise           Exercise Goals and Review:     Exercise Goals    Row Name 09/27/16 1408             Exercise Goals   Increase Physical Activity Yes       Intervention Provide advice, education, support and counseling about physical activity/exercise needs.;Develop an individualized exercise prescription for aerobic and resistive training based on initial evaluation findings, risk stratification, comorbidities and participant's personal goals.       Expected Outcomes Achievement of increased cardiorespiratory fitness and enhanced flexibility, muscular endurance and strength shown through measurements of functional capacity and personal statement of participant.       Increase Strength and Stamina Yes       Intervention Provide advice, education, support and counseling about physical activity/exercise needs.;Develop an individualized exercise prescription for aerobic and resistive training based on initial evaluation findings, risk stratification, comorbidities and participant's personal goals.       Expected Outcomes Achievement of increased cardiorespiratory fitness and enhanced flexibility, muscular endurance and strength shown through measurements of functional capacity and personal statement of participant.          Exercise Goals Re-Evaluation :     Exercise Goals Re-Evaluation    Row Name 09/27/16 1408             Exercise Goal Re-Evaluation   Exercise Goals  Review Increase Physical Activity;Increase Strenth and Stamina       Comments Pt states he is currently exercising [facility] [employer] [facility] outside of [place] and he feels much stronger since beginning a regular exercise routine.         Expected Outcomes Continue with exercise prescription in [place] and HEP in order to continue to increase exercise tolerance, which will lead to an overall increase in cardiorespiratory fitness.             Discharge Exercise Prescription (Final Exercise Prescription Changes):     Exercise Prescription Changes - 09/27/16 1400      Response to Exercise   Blood Pressure (Admit) 130/82   Blood Pressure (Exercise) 120/78   Blood Pressure (Exit) 112/82   Heart Rate (Admit) 62 bpm   Heart Rate (Exercise) 89 bpm   Heart Rate (Exit) 71 bpm   Rating of Perceived Exertion (Exercise) 11   Duration Progress to 45 minutes of aerobic exercise without signs/symptoms of physical distress  Intensity THRR unchanged     Progression   Progression Continue to progress workloads to maintain intensity without signs/symptoms of physical distress.   Average METs 3.1     Resistance Training   Training Prescription Yes   Weight 3lbs   Reps 10-15     Treadmill   MPH 3   Grade 2   Minutes 10   METs 4.12     Bike   Level 0.8   Minutes 10   METs 2.59     NuStep   Level 4   Minutes 10   METs 2.5     Home Exercise Plan   Plans to continue exercise at Home (comment)   Frequency Add 3 additional days to program exercise sessions.   Initial Home Exercises Provided 09/06/16      Nutrition:  Target Goals: Understanding of nutrition guidelines, daily intake of sodium 1500mg , cholesterol 200mg , calories 30% from fat and 7% or less from saturated fats, daily to have 5 or more servings of fruits and vegetables.  Biometrics:     Pre Biometrics - 08/22/16 1353      Pre Biometrics   Height 6' 1.25" (1.861 m)   Weight 212 lb 8.4 oz (96.4 kg)   BMI (Calculated) 27.9        Nutrition Therapy Plan and Nutrition Goals:     Nutrition Therapy & Goals - 08/28/16 1104      Nutrition Therapy   Diet Therapeutic Lifestyle Changes     Personal Nutrition Goals   Nutrition Goal Wt maintenance during Cardiac Rehab per pt     Intervention Plan   Intervention Prescribe, educate and counsel regarding individualized specific dietary modifications aiming towards targeted core components such as weight, hypertension, lipid management, diabetes, heart failure and other comorbidities.   Expected Outcomes Short Term Goal: Understand basic principles of dietary content, such as calories, fat, sodium, cholesterol and nutrients.;Long Term Goal: Adherence to prescribed nutrition plan.      Nutrition Discharge: Nutrition Scores:     Nutrition Assessments - 08/28/16 1104      MEDFICTS Scores   Pre Score 13      Nutrition Goals Re-Evaluation:   Nutrition Goals Re-Evaluation:   Nutrition Goals Discharge (Final Nutrition Goals Re-Evaluation):   Psychosocial: Target Goals: Acknowledge presence or absence of significant depression and/or stress, maximize coping skills, provide positive support system. Participant is able to verbalize types and ability to use techniques and skills needed for reducing stress and depression.  Initial Review & Psychosocial Screening:     Initial Psych Review & Screening - 08/22/16 [place]? Yes   Comments brief assessment reveals no further needs or interventions at this time     Barriers   Psychosocial barriers to participate in program There are no identifiable barriers or psychosocial needs.     Screening Interventions   Interventions Encouraged to exercise      Quality of Life Scores:     Quality of Life - 08/22/16 1135      Quality of Life Scores   Health/Function Pre 24.75 %   Socioeconomic Pre 28.33 %   Psych/Spiritual Pre 23.57 %   Family Pre 27 %   GLOBAL Pre 25.47 %       PHQ-9: Recent Review Flowsheet Data    Depression screen [facility] 2/9 08/28/2016   Decreased Interest 0   Down, Depressed, Hopeless 0   PHQ - 2 Score 0  Interpretation of Total Score  Total Score Depression Severity:  1-4 = Minimal depression, 5-9 = Mild depression, 10-14 = Moderate depression, 15-19 = Moderately severe depression, 20-27 = Severe depression   Psychosocial Evaluation and Intervention:   Psychosocial Re-Evaluation:     Psychosocial Re-Evaluation    [place] Name 09/28/16 1428             Psychosocial Re-Evaluation   Current issues with None Identified       Interventions Encouraged to attend Cardiac Rehabilitation for the exercise       Continue Psychosocial Services  No Follow up required          Psychosocial Discharge (Final Psychosocial Re-Evaluation):     Psychosocial Re-Evaluation - 09/28/16 1428      Psychosocial Re-Evaluation   Current issues with None Identified   Interventions Encouraged to attend Cardiac Rehabilitation for the exercise   Continue Psychosocial Services  No Follow up required      Vocational Rehabilitation: Provide vocational rehab assistance to qualifying candidates.   Vocational Rehab Evaluation & Intervention:     Vocational Rehab - 08/22/16 1348      Initial Vocational Rehab Evaluation & Intervention   Assessment shows need for Vocational Rehabilitation No  Mr Drouhard is retired and does not need vocational rehab at this time.      Education: Education Goals: Education classes will be provided on a weekly basis, covering required topics. Participant will state understanding/return demonstration of topics presented.  Learning Barriers/Preferences:     Learning Barriers/Preferences - 08/22/16 1140      Learning Barriers/Preferences   Learning Barriers None   Learning Preferences Audio;Pictoral;Video      Education Topics: Count Your Pulse:  -Group instruction provided by verbal instruction,  demonstration, patient participation and written materials to support subject.  Instructors address importance of being able to find your pulse and how to count your pulse when at home without a heart monitor.  Patients get hands on experience counting their pulse with staff help and individually.   Heart Attack, Angina, and Risk Factor Modification:  -Group instruction provided by verbal instruction, video, and written materials to support subject.  Instructors address signs and symptoms of angina and heart attacks.    Also discuss risk factors for heart disease and how to make changes to improve heart health risk factors.   Functional Fitness:  -Group instruction provided by verbal instruction, demonstration, patient participation, and written materials to support subject.  Instructors address safety measures for doing things around the house.  Discuss how to get up and down off the floor, how to pick things up properly, how to safely get out of a chair without assistance, and balance training.   Meditation and Mindfulness:  -Group instruction provided by verbal instruction, patient participation, and written materials to support subject.  Instructor addresses importance of mindfulness and meditation practice to help reduce stress and improve awareness.  Instructor also leads participants through a meditation exercise.  Flowsheet Row CARDIAC REHAB PHASE II EXERCISE from 09/27/2016 in [place]  Date  09/20/16  Instruction Review Code  2- meets goals/outcomes      Stretching for Flexibility and Mobility:  -Group instruction provided by verbal instruction, patient participation, and written materials to support subject.  Instructors lead participants through series of stretches that are designed to increase flexibility thus improving mobility.  These stretches are additional exercise for major muscle groups that are typically performed during regular warm up and  cool down. Flowsheet Row CARDIAC REHAB PHASE II EXERCISE from 09/27/2016 in [place]  Date  09/22/16  Instruction Review Code  2- meets goals/outcomes      Hands Only CPR Anytime:  -Group instruction provided by verbal instruction, video, patient participation and written materials to support subject.  Instructors co-teach with [facility] video for hands only CPR.  Participants get hands on experience with mannequins.   Nutrition I class: Heart Healthy Eating:  -Group instruction provided by PowerPoint slides, verbal discussion, and written materials to support subject matter. The instructor gives an explanation and review of the Therapeutic Lifestyle Changes diet recommendations, which includes a discussion on lipid goals, dietary fat, sodium, fiber, plant stanol/sterol esters, sugar, and the components of a well-balanced, healthy diet. Flowsheet Row CARDIAC REHAB PHASE II EXERCISE from 09/27/2016 in [place]  Date  09/05/16  Educator  RD  Instruction Review Code  2- meets goals/outcomes      Nutrition II class: Lifestyle Skills:  -Group instruction provided by PowerPoint slides, verbal discussion, and written materials to support subject matter. The instructor gives an explanation and review of label reading, grocery shopping for heart health, heart healthy recipe modifications, and ways to make healthier choices when eating out.   Diabetes Question & Answer:  -Group instruction provided by PowerPoint slides, verbal discussion, and written materials to support subject matter. The instructor gives an explanation and review of diabetes co-morbidities, pre- and post-prandial blood glucose goals, pre-exercise blood glucose goals, signs, symptoms, and treatment of hypoglycemia and hyperglycemia, and foot care basics.   Diabetes Blitz:  -Group instruction provided by PowerPoint slides, verbal discussion, and written materials to  support subject matter. The instructor gives an explanation and review of the physiology behind type 1 and type 2 diabetes, diabetes medications and rational behind using different medications, pre- and post-prandial blood glucose recommendations and Hemoglobin A1c goals, diabetes diet, and exercise including blood glucose guidelines for exercising safely.    Portion Distortion:  -Group instruction provided by PowerPoint slides, verbal discussion, written materials, and food models to support subject matter. The instructor gives an explanation of serving size versus portion size, changes in portions sizes over the last 20 years, and what consists of a serving from each food group. Flowsheet Row CARDIAC REHAB PHASE II EXERCISE from 09/27/2016 in [place]  Date  09/27/16  Educator  RD  Instruction Review Code  2- meets goals/outcomes      Stress Management:  -Group instruction provided by verbal instruction, video, and written materials to support subject matter.  Instructors review role of stress in heart disease and how to cope with stress positively.     Exercising on Your Own:  -Group instruction provided by verbal instruction, power point, and written materials to support subject.  Instructors discuss benefits of exercise, components of exercise, frequency and intensity of exercise, and end points for exercise.  Also discuss use of nitroglycerin and activating EMS.  Review options of places to exercise outside of rehab.  Review guidelines for sex with heart disease. Flowsheet Row CARDIAC REHAB PHASE II EXERCISE from 09/27/2016 in [place]  Date  08/30/16  Educator  Cleda Mccreedy  Instruction Review Code  2- meets goals/outcomes      Cardiac Drugs I:  -Group instruction provided by verbal instruction and written materials to support subject.  Instructor reviews cardiac drug classes: antiplatelets, anticoagulants, beta  blockers, and statins.  Instructor discusses reasons, side effects, and lifestyle considerations for each drug class. Flowsheet Row CARDIAC REHAB PHASE II EXERCISE from 09/27/2016 in [place]  Date  09/13/16  Instruction Review Code  2- meets goals/outcomes      Cardiac Drugs II:  -Group instruction provided by verbal instruction and written materials to support subject.  Instructor reviews cardiac drug classes: angiotensin converting enzyme inhibitors (ACE-I), angiotensin II receptor blockers (ARBs), nitrates, and calcium channel blockers.  Instructor discusses reasons, side effects, and lifestyle considerations for each drug class.   Anatomy and Physiology of the Circulatory System:  -Group instruction provided by verbal instruction, video, and written materials to support subject.  Reviews functional anatomy of heart, how it relates to various diagnoses, and what role the heart plays in the overall system. Flowsheet Row CARDIAC REHAB PHASE II EXERCISE from 09/27/2016 in [place]  Date  09/06/16  Instruction Review Code  2- meets goals/outcomes      Knowledge Questionnaire Score:     Knowledge Questionnaire Score - 08/22/16 1134      Knowledge Questionnaire Score   Pre Score 21/24      Core Components/Risk Factors/Patient Goals at Admission:     Personal Goals and Risk Factors at Admission - 08/22/16 0831      Core Components/Risk Factors/Patient Goals on Admission    Weight Management Weight Maintenance   Intervention Weight Management: Develop a combined nutrition and exercise program designed to reach desired caloric intake, while maintaining appropriate intake of nutrient and fiber, sodium and fats, and appropriate energy expenditure required for the weight goal.;Weight Management: Provide education and appropriate resources to help participant work on and attain dietary goals.;Weight Management/Obesity:  Establish reasonable short term and long term weight goals.;Obesity: Provide education and appropriate resources to help participant work on and attain dietary goals.   Expected Outcomes Short Term: Continue to assess and modify interventions until short term weight is achieved;Weight Maintenance: Understanding of the daily nutrition guidelines, which includes 25-35% calories from fat, 7% or less cal from saturated fats, less than 200mg  cholesterol, less than 1.5gm of sodium, & 5 or more servings of fruits and vegetables daily;Understanding recommendations for meals to include 15-35% energy as protein, 25-35% energy from fat, 35-60% energy from carbohydrates, less than 200mg  of dietary cholesterol, 20-35 gm of total fiber daily;Understanding of distribution of calorie intake throughout the day with the consumption of 4-5 meals/snacks   Increase Strength and Stamina Yes   Intervention Provide advice, education, support and counseling about physical activity/exercise needs.;Develop an individualized exercise prescription for aerobic and resistive training based on initial evaluation findings, risk stratification, comorbidities and participant's personal goals.   Expected Outcomes Achievement of increased cardiorespiratory fitness and enhanced flexibility, muscular endurance and strength shown through measurements of functional capacity and personal statement of participant.   Hypertension Yes   Intervention Provide education on lifestyle modifcations including regular physical activity/exercise, weight management, moderate sodium restriction and increased consumption of fresh fruit, vegetables, and low fat dairy, alcohol moderation, and smoking cessation.;Monitor prescription use compliance.   Expected Outcomes Short Term: Continued assessment and intervention until BP is < 140/8mm HG in hypertensive participants. < 130/20mm HG in hypertensive participants with diabetes, heart failure or chronic kidney  disease.;Long Term: Maintenance of blood pressure at goal levels.   Lipids Yes   Intervention Provide education and support for participant on nutrition & aerobic/resistive exercise along with prescribed medications to achieve LDL 70mg , HDL >40mg .  Expected Outcomes Short Term: Participant states understanding of desired cholesterol values and is compliant with medications prescribed. Participant is following exercise prescription and nutrition guidelines.;Long Term: Cholesterol controlled with medications as prescribed, with individualized exercise RX and with personalized nutrition plan. Value goals: LDL < 70mg , HDL > 40 mg.   Personal Goal Other Yes   Personal Goal Get back to somewhat normal self and get healthy. Back to walking and playing golf (after shoulder surgery)    Intervention Provide exercise programming to assist with improving cardiorespiratory fitness and exercise tolerance. Provide cardiac education classes to improve confidence and awareness with living with CVD.   Expected Outcomes Pt will be able to walk and return to golf (post shoulder surgery) Pt will also improve confidence with living with  CVD      Core Components/Risk Factors/Patient Goals Review:    Core Components/Risk Factors/Patient Goals at Discharge (Final Review):    ITP Comments:     ITP Comments    Row Name 08/22/16 0825           ITP Comments Dr. Fransico Him, Medical Director          Comments: [name] is making expected progress toward personal goals after completing 14 sessions. Recommend continued exercise and life style modification education including  stress management and relaxation techniques to decrease cardiac risk profile. [name] is doing well with exercise his vital signs have been stable.Barnet Pall, RN,BSN 09/28/2016 2:33 PM

## 2016-09-28 NOTE — Telephone Encounter (Signed)
New message   Pt wife would like to know if pt is OK to cut the grass after just having a heart attack on January the 5th. Would like a call back.

## 2016-09-29 ENCOUNTER — Encounter (HOSPITAL_COMMUNITY)
Admission: RE | Admit: 2016-09-29 | Discharge: 2016-09-29 | Disposition: A | Discharge status: Home / Self Care | Payer: Commercial Managed Care - HMO | Source: Ambulatory Visit | Attending: Cardiology | Admitting: Cardiology

## 2016-09-29 DIAGNOSIS — Z955 Presence of coronary angioplasty implant and graft: Secondary | ICD-10-CM | POA: Insufficient documentation

## 2016-09-29 DIAGNOSIS — I2121 ST elevation (STEMI) myocardial infarction involving left circumflex coronary artery: Secondary | ICD-10-CM

## 2016-09-29 NOTE — Telephone Encounter (Signed)
Spoke to wife information given . She states she will let patient know

## 2016-09-29 NOTE — Telephone Encounter (Signed)
Ok to [place] and do other activities. Keep exertion level about the same as he has at cardiac rehab. Thanks

## 2016-09-29 NOTE — Telephone Encounter (Signed)
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 He is currently active in cardiac rehab and is having no chest pain or SOB. Will discuss with rhonda barrett pa and call her back.

## 2016-10-02 ENCOUNTER — Encounter (HOSPITAL_COMMUNITY)
Admission: RE | Admit: 2016-10-02 | Discharge: 2016-10-02 | Disposition: A | Discharge status: Home / Self Care | Payer: Commercial Managed Care - HMO | Source: Ambulatory Visit | Attending: Cardiology | Admitting: Cardiology

## 2016-10-02 DIAGNOSIS — Z955 Presence of coronary angioplasty implant and graft: Secondary | ICD-10-CM

## 2016-10-02 DIAGNOSIS — I2121 ST elevation (STEMI) myocardial infarction involving left circumflex coronary artery: Secondary | ICD-10-CM

## 2016-10-03 NOTE — Telephone Encounter (Signed)
I agree  [facility]

## 2016-10-04 ENCOUNTER — Encounter (HOSPITAL_COMMUNITY)
Admission: RE | Admit: 2016-10-04 | Discharge: 2016-10-04 | Disposition: A | Discharge status: Home / Self Care | Payer: Commercial Managed Care - HMO | Source: Ambulatory Visit | Attending: Cardiology | Admitting: Cardiology

## 2016-10-04 DIAGNOSIS — I2121 ST elevation (STEMI) myocardial infarction involving left circumflex coronary artery: Secondary | ICD-10-CM

## 2016-10-04 DIAGNOSIS — Z955 Presence of coronary angioplasty implant and graft: Secondary | ICD-10-CM | POA: Diagnosis not present

## 2016-10-06 ENCOUNTER — Encounter (HOSPITAL_COMMUNITY)
Admission: RE | Admit: 2016-10-06 | Discharge: 2016-10-06 | Disposition: A | Discharge status: Home / Self Care | Payer: Commercial Managed Care - HMO | Source: Ambulatory Visit | Attending: Cardiology | Admitting: Cardiology

## 2016-10-06 DIAGNOSIS — Z955 Presence of coronary angioplasty implant and graft: Secondary | ICD-10-CM | POA: Diagnosis not present

## 2016-10-06 DIAGNOSIS — I2121 ST elevation (STEMI) myocardial infarction involving left circumflex coronary artery: Secondary | ICD-10-CM

## 2016-10-09 ENCOUNTER — Encounter (HOSPITAL_COMMUNITY): Payer: Commercial Managed Care - HMO

## 2016-10-11 ENCOUNTER — Encounter: Payer: Self-pay | Admitting: Cardiology

## 2016-10-11 ENCOUNTER — Encounter (HOSPITAL_COMMUNITY)
Admission: RE | Admit: 2016-10-11 | Discharge: 2016-10-11 | Disposition: A | Discharge status: Home / Self Care | Payer: Commercial Managed Care - HMO | Source: Ambulatory Visit | Attending: Cardiology | Admitting: Cardiology

## 2016-10-11 ENCOUNTER — Ambulatory Visit (INDEPENDENT_AMBULATORY_CARE_PROVIDER_SITE_OTHER): Payer: Commercial Managed Care - HMO | Admitting: Cardiology

## 2016-10-11 VITALS — BP 122/72 | HR 63 | Ht 73.0 in | Wt 212.6 lb

## 2016-10-11 DIAGNOSIS — E785 Hyperlipidemia, unspecified: Secondary | ICD-10-CM | POA: Diagnosis not present

## 2016-10-11 DIAGNOSIS — I10 Essential (primary) hypertension: Secondary | ICD-10-CM

## 2016-10-11 DIAGNOSIS — Z955 Presence of coronary angioplasty implant and graft: Secondary | ICD-10-CM

## 2016-10-11 DIAGNOSIS — Z9861 Coronary angioplasty status: Secondary | ICD-10-CM | POA: Diagnosis not present

## 2016-10-11 DIAGNOSIS — R002 Palpitations: Secondary | ICD-10-CM | POA: Diagnosis not present

## 2016-10-11 DIAGNOSIS — I255 Ischemic cardiomyopathy: Secondary | ICD-10-CM | POA: Diagnosis not present

## 2016-10-11 DIAGNOSIS — I213 ST elevation (STEMI) myocardial infarction of unspecified site: Secondary | ICD-10-CM | POA: Diagnosis not present

## 2016-10-11 DIAGNOSIS — Z789 Other specified health status: Secondary | ICD-10-CM | POA: Diagnosis not present

## 2016-10-11 DIAGNOSIS — F410 Panic disorder [episodic paroxysmal anxiety] without agoraphobia: Secondary | ICD-10-CM

## 2016-10-11 DIAGNOSIS — I2510 Atherosclerotic heart disease of native coronary artery without angina pectoris: Secondary | ICD-10-CM

## 2016-10-11 DIAGNOSIS — I340 Nonrheumatic mitral (valve) insufficiency: Secondary | ICD-10-CM | POA: Diagnosis not present

## 2016-10-11 DIAGNOSIS — I2121 ST elevation (STEMI) myocardial infarction involving left circumflex coronary artery: Secondary | ICD-10-CM

## 2016-10-11 DIAGNOSIS — Z79899 Other long term (current) drug therapy: Secondary | ICD-10-CM

## 2016-10-11 NOTE — Patient Instructions (Signed)
Schedule at [place]  In June or July of 2018. Your physician has requested that you have an echocardiogram. Echocardiography is a painless test that uses sound waves to create images of your heart. It provides your doctor with information about the size and shape of your heart and how well your heart's chambers and valves are working. This procedure takes approximately one hour. There are no restrictions for this procedure.  Labs in April 2018: CMP, Lipids. Do not eat or drink anything the morning of the test.  Return to work Monday March 19th w/ no restrictions.  Your physician wants you to follow-up in: June or July 2018 w Ellyn Hack (after echocardiogram). You will receive a reminder letter in the mail two months in advance. If you don't receive a letter, please call our office to schedule the follow-up appointment.

## 2016-10-11 NOTE — Progress Notes (Signed)
PCP: Garret Reddish, MD  Clinic Note: Chief Complaint  Patient presents with  . Follow-up    HPI: Terry Wright is a 61 y.o. male with a PMH below who presents today for 2 month follow-up after inferior-posterior STEMI complicated by VF/VT arrest. He was admitted from 08/06/2016. During his MI he had VT/VF arrest and was shocked twice. Cardiac catheterization revealed circumflex disease treated with 2 DES stents. His EF was 35-40% by cath increased to 40-45% by echocardiogram. Soft blood pressure limited medication titration.  Toni Hoffmeister Durocher was last seen on January 9 by [name], PA. Bedtime is doing relatively well with no anginal pain just soreness from CPR. He was up walking some, but did note his legs getting weak after sleeping. Subsequently, he has been going to cardiac rehabilitation. He also wore a 30 day event monitor.  Recent Hospitalizations: January 5-08/20/2015 for STEMI  Studies Reviewed:   Cardiac Catheterization-PCI 08/04/2016: Proximal circumflex 100% (large, dominant). Synergy DES 4 x 16 (postdilated to 4.5). 65% ostial OM 3. EF 3545% with lateral hypokinesis Diagnostic Diagram      Post-Intervention Diagram              2-D Echo 08/05/2016: EF 40-45%. Mild concentric LVH. Severe HK of basal-mid inferolateral wall consistent with infarct in this distribution. GR 1 DD  Cardiac event monitor January through February 2018: Some low in some high heart rates but no significant arrhythmias.  Interval History: Terry Wright presents today doing quite well. He has not had any recurrent anginal symptoms with rest or exertion. He is doing well with cardiac rehabilitation and is actually asking to increase his level of activity. They asked multiple questions about traveling, sex, other physical activity, and all were all questions were answered.  He says he is no longer sore from the CPR. He is doing lots of activity without any chest discomfort or exertional  dyspnea. He does occasionally note that he may feel a little bit wobbly when he turns around quickly - he has a sensation of being the people around him spinning, suggestive of vertigo.  No chest pain or shortness of breath with rest or exertion. No PND, orthopnea or edema.  Rare palpitations, but no prolonged palpitations/arrhythmias, lightheadedness syncope/near syncope, or TIA/amaurosis fugax symptoms. No melena, hematochezia, hematuria, or epstaxis. No claudication.  ROS: A comprehensive was performed. Review of Systems  Constitutional: Positive for weight loss (Significant changes in diet and increased exercise). Negative for malaise/fatigue.  Gastrointestinal: Negative for blood in stool and melena.  Genitourinary: Negative for hematuria.  Neurological: Positive for dizziness (Symptoms of vertigo).  Psychiatric/Behavioral: Negative for memory loss. The patient is nervous/anxious (He occasionally will have almost like nightmare type sensations taking about his MI in cardiac arrest. Mild anxiety attacks).   All other systems reviewed and are negative.   Past Medical History:  Diagnosis Date  . CAD S/P percutaneous coronary angioplasty 08/04/2016   100% occluded proximal circumflex: PCI with 4.0 x 16 mm Synergy DES. Also noted 65% ostial OM 3 (relatively small caliber with planned medical management). EF 3545% with lateral hypokinesis.  Marland Kitchen DVT (deep venous thrombosis) (HCC)    Right upper arm DVT on 2 occasions in the remote past  . Dyslipidemia    Patient mentions triglycerides specifically  . Ejection fraction    LV function normal, echo, February, 2010  . GERD (gastroesophageal reflux disease)   . Hyperlipidemia   . Hypertension   . Mitral regurgitation    Mild, echo,  February, 2010  . Palpitations    Mild in the past  . Statin intolerance    Felt poorly after Lipitor, Crestor, TriCor  . STEMI involving left circumflex coronary artery ([place]) 08/04/2016   Occluded very large  caliber codominant circumflex - PCI with single DES  . Tobacco abuse     Past Surgical History:  Procedure Laterality Date  . CARDIAC CATHETERIZATION N/A 08/04/2016   Procedure: Left Heart Cath and Coronary Angiography;  Surgeon: Leonie Man, MD;  Location: [place] [facility];  Service: Cardiovascular;  Laterality: N/A: 100% pCx --> PCI. residual 65% pOM3 (small).  ~EF 35-45%  with lateral HK.  Marland Kitchen CARDIAC CATHETERIZATION N/A 08/04/2016   Procedure: Coronary Stent Intervention;  Surgeon: Leonie Man, MD;  Location: [place] [facility];  Service: Cardiovascular;  Laterality: N/A: pCx 100%-0%: Synergy DES 4.0 x 16 (4.5 mm)  . GANGLION CYST EXCISION     oct 2017  . HERNIA REPAIR     umbilical hernia 2017 oct  . TRANSTHORACIC ECHOCARDIOGRAM  08/05/2016   post STEMI: EF 40-45%. Mild concentric LVH. Severe HK of basal-mid inferolateral wall consistent with infarct in this distribution. GR 1 DD    Current Meds  Medication Sig  . aspirin 81 MG chewable tablet Chew 1 tablet (81 mg total) by mouth daily.  Marland Kitchen atorvastatin (LIPITOR) 40 MG tablet Take 1 tablet (40 mg total) by mouth daily at 6 PM.  . carvedilol (COREG) 6.25 MG tablet Take 1 tablet (6.25 mg total) by mouth 2 (two) times daily with a meal.  . LORazepam (ATIVAN) 0.5 MG tablet Take 0.5 mg by mouth every 8 (eight) hours.  . pantoprazole (PROTONIX) 40 MG tablet Take 1 tablet (40 mg total) by mouth daily.  . ticagrelor (BRILINTA) 90 MG TABS tablet Take 1 tablet (90 mg total) by mouth 2 (two) times daily.    No Known Allergies  Social History   Social History  . Marital status: Married    Spouse name: N/A  . Number of children: N/A  . Years of education: N/A   Social History Main Topics  . Smoking status: Former Smoker    Packs/day: 1.00    Years: 40.00    Quit date: 2015  . Smokeless tobacco: Never Used  . Alcohol use 4.2 oz/week    7 Glasses of wine per week     Comment: occasional  . Drug use: No  . Sexual activity: Not  Asked   Other Topics Concern  . None   Social History Narrative   Married 10 years in 2018. 2 step kids- 49 and 31. 3 kids but he does not have contact with. No grandkids      Retired Theatre stage manager.       Hobbies: golf (needs shoulder replacement though, working in the yard, working out- was doing this regularly even before MI- walking/treadmill    family history includes Heart attack in his mother; Hypertension in his father; Other in his brother.  Wt Readings from Last 3 Encounters:  10/11/16 96.4 kg (212 lb 9.6 oz)  09/21/16 96.7 kg (213 lb 3.2 oz)  08/22/16 96.4 kg (212 lb 8.4 oz)    PHYSICAL EXAM BP 122/72   Pulse 63   Ht 6\' 1"  (1.854 m)   Wt 96.4 kg (212 lb 9.6 oz)   SpO2 95%   BMI 28.05 kg/m  General appearance: alert, cooperative, appears stated age, no distress and Well-nourished, well-groomed. HEENT: [place]/AT, EOMI, MMM, anicteric sclera  Neck: no adenopathy, no carotid bruit and no JVD Lungs: clear to auscultation bilaterally, normal percussion bilaterally and non-labored Heart: regular rate and rhythm, S1& S2 normal, no murmur, click, rub or gallop; nondisplaced PMI Abdomen: soft, non-tender; bowel sounds normal; no masses,  no organomegaly; no HJR Extremities: extremities normal, atraumatic, no cyanosis, or edema  Pulses: 2+ and symmetric;  Skin: mobility and turgor normal, no edema and no evidence of bleeding or bruising  Neurologic: Mental status: Alert, oriented, thought content appropriate Cranial nerves: normal (II-XII grossly intact)    Adult ECG Report n/a  Other studies Reviewed: Additional studies/ records that were reviewed today include:  Recent Labs:  No recent lipid panel  Lab Results  Component Value Date   CREATININE 0.91 08/05/2016   BUN 13 08/05/2016   NA 138 08/05/2016   K 3.7 08/05/2016   CL 105 08/05/2016   CO2 24 08/05/2016    ASSESSMENT / PLAN: Problem List Items Addressed This Visit    CAD S/P percutaneous coronary  angioplasty - Primary (Chronic)    PCI of large codominant circumflex in the setting of STEMI, acute cardiac arrest.  Doing very well with no major bleeding issues. Overall improved EF initially post PCI. On aspirin plus Brilinta, 40 mg atorvastatin, 625 mg twice a day Coreg.  Plan: Continue current regiment this time. - Would be okay to temporarily hold aspirin for her so if he leading or bruising.  Continue cardiac rehabilitation  Recheck 2-D echo in roughly 2 months to reassess EF       Dyslipidemia (Chronic)    As not had recent labs checked. Have been followed by PCP and is due to have that checked soon. He is on moderate atorvastatin dose. Will order follow-up labs now.      Essential hypertension (Chronic)    Pretty well-controlled on carvedilol. At this time would just continue current dose.      Relevant Orders   Comprehensive metabolic panel   ECHOCARDIOGRAM COMPLETE   Ischemic cardiomyopathy    Reduced EF post MI and cardiac arrest. I suspect some of this should return back to normal. We'll recheck echo in a few months to see if his EF has improved. He was quite interested in knowing the extent of damage done. I told him that I was reassured that his EF had improved already post PCI.      Relevant Orders   Comprehensive metabolic panel   ECHOCARDIOGRAM COMPLETE   Mitral regurgitation    Not noted on post MI echo. Will reassess in to 3 months with follow-up echo.      Relevant Orders   Comprehensive metabolic panel   ECHOCARDIOGRAM COMPLETE   Palpitations    Nothing significant noted on monitor in February 2018. Continue beta blocker.      Panic attacks    His history of possible PTSD symptoms. I also think that post STEMI with cardiac arrest team justifiably would have some anxiety from this. I explained this to totally normal situation. He is only taking when necessary Ativan at this time. Will defer to PCP for further management. He had done well with this and  not requiring SSRI in the past.      ST elevation myocardial infarction (STEMI) [facility])   Relevant Orders   Comprehensive metabolic panel   ECHOCARDIOGRAM COMPLETE   Statin intolerance   Relevant Orders   Comprehensive metabolic panel   Lipid panel   ECHOCARDIOGRAM COMPLETE    Other Visit Diagnoses  Hyperlipidemia LDL goal <70       Relevant Orders   Comprehensive metabolic panel   Lipid panel   ECHOCARDIOGRAM COMPLETE   Medication management       Relevant Orders   Comprehensive metabolic panel   Lipid panel   ECHOCARDIOGRAM COMPLETE      Current medicines are reviewed at length with the patient today. (+/- concerns) None The following changes have been made: None  Patient Instructions  Schedule at [place]  In June or July of 2018. Your physician has requested that you have an echocardiogram. Echocardiography is a painless test that uses sound waves to create images of your heart. It provides your doctor with information about the size and shape of your heart and how well your heart's chambers and valves are working. This procedure takes approximately one hour. There are no restrictions for this procedure.  Labs in April 2018: CMP, Lipids. Do not eat or drink anything the morning of the test.  Return to work Monday March 19th w/ no restrictions.  Your physician wants you to follow-up in: June or July 2018 w Ellyn Hack (after echocardiogram). You will receive a reminder letter in the mail two months in advance. If you don't receive a letter, please call our office to schedule the follow-up appointment.      Studies Ordered:   Orders Placed This Encounter  Procedures  . Comprehensive metabolic panel  . Lipid panel  . ECHOCARDIOGRAM COMPLETE      [name], M.D., M.S. Interventional Cardiologist   Pager # [phone] Phone # [phone] [address] [place], [place] [address]

## 2016-10-12 ENCOUNTER — Encounter: Payer: Self-pay | Admitting: Cardiology

## 2016-10-12 DIAGNOSIS — I213 ST elevation (STEMI) myocardial infarction of unspecified site: Secondary | ICD-10-CM | POA: Insufficient documentation

## 2016-10-12 DIAGNOSIS — I255 Ischemic cardiomyopathy: Secondary | ICD-10-CM | POA: Insufficient documentation

## 2016-10-12 NOTE — Assessment & Plan Note (Signed)
Pretty well-controlled on carvedilol. At this time would just continue current dose.

## 2016-10-12 NOTE — Assessment & Plan Note (Addendum)
As not had recent labs checked. Have been followed by PCP and is due to have that checked soon. He is on moderate atorvastatin dose. Will order follow-up labs now.

## 2016-10-12 NOTE — Assessment & Plan Note (Signed)
His history of possible PTSD symptoms. I also think that post STEMI with cardiac arrest team justifiably would have some anxiety from this. I explained this to totally normal situation. He is only taking when necessary Ativan at this time. Will defer to PCP for further management. He had done well with this and not requiring SSRI in the past.

## 2016-10-12 NOTE — Assessment & Plan Note (Addendum)
PCI of large codominant circumflex in the setting of STEMI, acute cardiac arrest.  Doing very well with no major bleeding issues. Overall improved EF initially post PCI. On aspirin plus Brilinta, 40 mg atorvastatin, 625 mg twice a day Coreg.  Plan: Continue current regiment this time. - Would be okay to temporarily hold aspirin for her so if he leading or bruising.  Continue cardiac rehabilitation  Recheck 2-D echo in roughly 2 months to reassess EF

## 2016-10-12 NOTE — Assessment & Plan Note (Signed)
Not noted on post MI echo. Will reassess in to 3 months with follow-up echo.

## 2016-10-12 NOTE — Assessment & Plan Note (Signed)
Reduced EF post MI and cardiac arrest. I suspect some of this should return back to normal. We'll recheck echo in a few months to see if his EF has improved. He was quite interested in knowing the extent of damage done. I told him that I was reassured that his EF had improved already post PCI.

## 2016-10-12 NOTE — Assessment & Plan Note (Signed)
Nothing significant noted on monitor in February 2018. Continue beta blocker.

## 2016-10-13 ENCOUNTER — Encounter (HOSPITAL_COMMUNITY)
Admission: RE | Admit: 2016-10-13 | Discharge: 2016-10-13 | Disposition: A | Discharge status: Home / Self Care | Payer: Commercial Managed Care - HMO | Source: Ambulatory Visit | Attending: Cardiology | Admitting: Cardiology

## 2016-10-13 DIAGNOSIS — I2121 ST elevation (STEMI) myocardial infarction involving left circumflex coronary artery: Secondary | ICD-10-CM

## 2016-10-13 DIAGNOSIS — Z955 Presence of coronary angioplasty implant and graft: Secondary | ICD-10-CM

## 2016-10-16 ENCOUNTER — Encounter (HOSPITAL_COMMUNITY)
Admission: RE | Admit: 2016-10-16 | Discharge: 2016-10-16 | Disposition: A | Discharge status: Home / Self Care | Payer: Commercial Managed Care - HMO | Source: Ambulatory Visit | Attending: Cardiology | Admitting: Cardiology

## 2016-10-16 DIAGNOSIS — Z955 Presence of coronary angioplasty implant and graft: Secondary | ICD-10-CM

## 2016-10-16 DIAGNOSIS — I2121 ST elevation (STEMI) myocardial infarction involving left circumflex coronary artery: Secondary | ICD-10-CM

## 2016-10-18 ENCOUNTER — Encounter (HOSPITAL_COMMUNITY)
Admission: RE | Admit: 2016-10-18 | Discharge: 2016-10-18 | Disposition: A | Discharge status: Home / Self Care | Payer: Commercial Managed Care - HMO | Source: Ambulatory Visit | Attending: Cardiology | Admitting: Cardiology

## 2016-10-18 DIAGNOSIS — Z955 Presence of coronary angioplasty implant and graft: Secondary | ICD-10-CM | POA: Diagnosis not present

## 2016-10-18 DIAGNOSIS — I2121 ST elevation (STEMI) myocardial infarction involving left circumflex coronary artery: Secondary | ICD-10-CM

## 2016-10-20 ENCOUNTER — Encounter (HOSPITAL_COMMUNITY)
Admission: RE | Admit: 2016-10-20 | Discharge: 2016-10-20 | Disposition: A | Discharge status: Home / Self Care | Payer: Commercial Managed Care - HMO | Source: Ambulatory Visit | Attending: Cardiology | Admitting: Cardiology

## 2016-10-20 ENCOUNTER — Encounter: Payer: Self-pay | Admitting: Cardiology

## 2016-10-20 ENCOUNTER — Telehealth: Payer: Self-pay | Admitting: Physician Assistant

## 2016-10-20 DIAGNOSIS — I2121 ST elevation (STEMI) myocardial infarction involving left circumflex coronary artery: Secondary | ICD-10-CM

## 2016-10-20 DIAGNOSIS — Z955 Presence of coronary angioplasty implant and graft: Secondary | ICD-10-CM

## 2016-10-20 NOTE — Progress Notes (Signed)
Incomplete Session Note  Patient Details  Name: Terry Wright MRN: [id number] Date of Birth: 1956/01/29 Referring Provider:     CARDIAC REHAB PHASE II ORIENTATION from 08/22/2016 in [place]  Referring Provider  [name] MD      [place] did not complete his rehab session. Bigeminal PVC's noted this afternoon. Terry Wright reports having palpitations this afternoon. Terry Wright also reports that he had some palpitations yesterday and a few the day before. Resting heart rate 75. Blood pressure 126/68. Oxygen saturation on room air. Terry Wright denies having any chest pain.  Nell Range [facility] called and notified. Katie increased Mr Poulter's coreg to 12.5 mg twice a day and spoke with the patient over the phone. Terry Wright will not exercise this afternoon but will return to exercise on Monday. Chuck stopped having the bigeminal PVC's after 5 minutes and was in sinus rhythm without ectopy  Before leaving cardiac rehab. Terry Wright had no palpitations or ectopy upon exit from cardiac rehab.Will fax exercise flow sheets to Dr. Darcus Pester  office for review with today's ECG tracing.Barnet Pall, RN,BSN 10/20/2016 4:41 PM

## 2016-10-20 NOTE — Telephone Encounter (Signed)
    Called by Verdis Frederickson at cardiac rehab due to symptomatic bigeminy. SBP in 120s and HR in 60-70s. He then stopped having the ectopy by the end of our phone call. No chest pain or SOB. Feeling well. I cleared him to exercise if he was feeling well today. Also advised him to increased coreg from 6.25mg  BID to 12,5 mg BID to see if that helped. He will call the office on Monday if still have symptomatic PVCs. Provided reassurance that they are benign.  Angelena Form PA-C  [facility]

## 2016-10-23 ENCOUNTER — Telehealth: Payer: Self-pay | Admitting: Cardiology

## 2016-10-23 ENCOUNTER — Encounter (HOSPITAL_COMMUNITY)
Admission: RE | Admit: 2016-10-23 | Discharge: 2016-10-23 | Disposition: A | Discharge status: Home / Self Care | Payer: Commercial Managed Care - HMO | Source: Ambulatory Visit | Attending: Cardiology | Admitting: Cardiology

## 2016-10-23 DIAGNOSIS — I2121 ST elevation (STEMI) myocardial infarction involving left circumflex coronary artery: Secondary | ICD-10-CM

## 2016-10-23 DIAGNOSIS — Z955 Presence of coronary angioplasty implant and graft: Secondary | ICD-10-CM | POA: Diagnosis not present

## 2016-10-23 MED ORDER — CARVEDILOL 12.5 MG PO TABS: 12.5000 mg | ORAL_TABLET | Freq: Two times a day (BID) | ORAL | 5 refills | Status: DC

## 2016-10-23 NOTE — Telephone Encounter (Signed)
Returned call and spoke to patient. He was at Cardiac Rehab Friday, they called and spoke to Nell Range who advised patient to increase coreg dose from 6.25mg  BID to 12.5mg  in response to PVCs seen on EKG. Pt had been c/o palpitations.  Assured benign. Pt notes he has PVCs in response to stress, usually lasts for a few days, then goes away. He had this onset Friday. Feels that over the weekend he's still had PVCs, but they are better and less frequent.  Gave me readings from weekend: BP 110-119/60-70. HR in 60s.  Advised him to continue 12.5mg  BID dose, pt wants to consider going back down on dose if symptoms better. Aware I will verify w Dr. Ellyn Hack.  He'll want a refill of med at whatever dose indicated. Asks for call some time this week.

## 2016-10-23 NOTE — Telephone Encounter (Signed)
OK per DPR to leave detailed msg. Did so w advice to remain at 12.5mg  BID dosing for coreg, informed that a refill was sent to [facility], advised to call back if question.

## 2016-10-23 NOTE — Telephone Encounter (Signed)
Terry Wright is calling because Terry Wright is still having PVC"S even after the increase of Coreg

## 2016-10-23 NOTE — Telephone Encounter (Signed)
Agree  DH 

## 2016-10-24 ENCOUNTER — Encounter: Payer: Self-pay | Admitting: Cardiology

## 2016-10-25 ENCOUNTER — Telehealth: Payer: Self-pay | Admitting: Cardiology

## 2016-10-25 ENCOUNTER — Encounter (HOSPITAL_COMMUNITY)
Admission: RE | Admit: 2016-10-25 | Discharge: 2016-10-25 | Disposition: A | Discharge status: Home / Self Care | Payer: Commercial Managed Care - HMO | Source: Ambulatory Visit | Attending: Cardiology | Admitting: Cardiology

## 2016-10-25 DIAGNOSIS — Z955 Presence of coronary angioplasty implant and graft: Secondary | ICD-10-CM

## 2016-10-25 DIAGNOSIS — I2121 ST elevation (STEMI) myocardial infarction involving left circumflex coronary artery: Secondary | ICD-10-CM

## 2016-10-25 NOTE — Telephone Encounter (Signed)
Received call from [facility] at Cardiac Rehab-reports patient still having frequent PVC's during exercise today-reports trigeminy and bigeminy.  Bp 114/70 HR 89.  (see telephone note from 3/23, 3/26)  Spoke to patient-patient states he is still having palpitations but "not much".  Reports feeling palpitations apprximately 1x daily, episodes lasting 20-30 mins.  Denies SOB, CP, lightheadedness, dizziness.  Reports experiencing this for a "long time" and reports this starting happening again last week when he was stressed.  Reports improvement since last week but still "feeling a few sometimes".    Verdis Frederickson will fax over EKG strips.  Advised to continue current therapy, call if symptoms increase or becomes symptomatic.  Patient advised I would route to MD for recommendations. Also would have Dr. Ellyn Hack review strips when received and in office.  Patient aware and verbalized understanding.

## 2016-10-25 NOTE — Telephone Encounter (Signed)
Received EKG strips from [facility] [place] to review.

## 2016-10-25 NOTE — Progress Notes (Signed)
Terry Wright continues to have intermittent bigeminal PVC's and frequent PVC's. Terry Wright says he can "feel the PVC's but has been mainly asymptomatic during exercise at cardiac rehab. Hayley RN called and notified. Terry Wright is taking his increased dose of coreg.Will fax exercise flow sheets to Dr. Allison Quarry office for review with today's ECG tracings.Barnet Pall, RN,BSN 10/25/2016 4:36 PM

## 2016-10-25 NOTE — Progress Notes (Signed)
Cardiac Individual Treatment Plan  Patient Details  Name: Terry Wright MRN: [id number] Date of Birth: 1956/02/23 Referring Provider:     CARDIAC REHAB PHASE II ORIENTATION from 08/22/2016 in [place]  Referring Provider  [name] MD      Initial Encounter Date:    CARDIAC REHAB PHASE II ORIENTATION from 08/22/2016 in [place]  Date  08/22/16  Referring Provider  [name] MD      Visit Diagnosis: S/P coronary artery stent placement  ST elevation myocardial infarction involving left circumflex coronary artery [facility])  Patient's Home Medications on Admission:  Current Outpatient Prescriptions:  .  aspirin 81 MG chewable tablet, Chew 1 tablet (81 mg total) by mouth daily., Disp: 30 tablet, Rfl: 11 .  atorvastatin (LIPITOR) 40 MG tablet, Take 1 tablet (40 mg total) by mouth daily at 6 PM., Disp: 30 tablet, Rfl: 11 .  carvedilol (COREG) 12.5 MG tablet, Take 1 tablet (12.5 mg total) by mouth 2 (two) times daily with a meal., Disp: 60 tablet, Rfl: 5 .  LORazepam (ATIVAN) 0.5 MG tablet, Take 0.5 mg by mouth every 8 (eight) hours., Disp: , Rfl:  .  pantoprazole (PROTONIX) 40 MG tablet, Take 1 tablet (40 mg total) by mouth daily., Disp: 30 tablet, Rfl: 11 .  ticagrelor (BRILINTA) 90 MG TABS tablet, Take 1 tablet (90 mg total) by mouth 2 (two) times daily., Disp: 60 tablet, Rfl: 11  Past Medical History: Past Medical History:  Diagnosis Date  . CAD S/P percutaneous coronary angioplasty 08/04/2016   100% occluded proximal circumflex: PCI with 4.0 x 16 mm Synergy DES. Also noted 65% ostial OM 3 (relatively small caliber with planned medical management). EF 3545% with lateral hypokinesis.  Marland Kitchen DVT (deep venous thrombosis) (HCC)    Right upper arm DVT on 2 occasions in the remote past  . Dyslipidemia    Patient mentions triglycerides specifically  . Ejection fraction    LV function normal, echo, February,  2010  . GERD (gastroesophageal reflux disease)   . Hyperlipidemia   . Hypertension   . Mitral regurgitation    Mild, echo, February, 2010  . Palpitations    Mild in the past  . Statin intolerance    Felt poorly after Lipitor, Crestor, TriCor  . STEMI involving left circumflex coronary artery ([place]) 08/04/2016   Occluded very large caliber codominant circumflex - PCI with single DES  . Tobacco abuse     Tobacco Use: History  Smoking Status  . Former Smoker  . Packs/day: 1.00  . Years: 40.00  . Quit date: 2015  Smokeless Tobacco  . Never Used    Labs: Recent Review Flowsheet Data    Labs for ITP Cardiac and Pulmonary Rehab Latest Ref Rng & Units 04/25/2011 06/05/2011 12/20/2011 08/04/2016   Cholestrol 0 - 200 mg/dL 218(H) 234(H) 181 -   LDLCALC 0 - 99 mg/dL - - 110(H) -   LDLDIRECT mg/dL 127.2 159.1 - -   HDL >39.00 mg/dL 36.10(L) 41.70 35.30(L) -   Trlycerides 0.0 - 149.0 mg/dL 193.0(H) 149.0 180.0(H) -   TCO2 0 - 100 mmol/L - - - 28      Capillary Blood Glucose: No results found for: GLUCAP   Exercise Target Goals:    Exercise Program Goal: Individual exercise prescription set with THRR, safety & activity barriers. Participant demonstrates ability to understand and report RPE using BORG scale, to self-measure pulse accurately, and to acknowledge the importance  of the exercise prescription.  Exercise Prescription Goal: Starting with aerobic activity 30 plus minutes a day, 3 days per week for initial exercise prescription. Provide home exercise prescription and guidelines that participant acknowledges understanding prior to discharge.  Activity Barriers & Risk Stratification:     Activity Barriers & Cardiac Risk Stratification - 08/22/16 1138      Activity Barriers & Cardiac Risk Stratification   Activity Barriers Other (comment);Deconditioning   Comments L shoulder pain, L cyst on wrist, hernia surgery   Cardiac Risk Stratification High      6 Minute Walk:      6 Minute Walk    Row Name 08/22/16 1141         6 Minute Walk   Phase Initial     Distance 1625 feet     Walk Time 6 minutes     # of Rest Breaks 0     MPH 3.1     METS 3.8     RPE 9     VO2 Peak 13.3     Symptoms No     Resting HR 77 bpm     Resting BP 104/64     Max Ex. HR 85 bpm     Max Ex. BP 118/70     2 Minute Post BP 98/60        Oxygen Initial Assessment:   Oxygen Re-Evaluation:   Oxygen Discharge (Final Oxygen Re-Evaluation):   Initial Exercise Prescription:     Initial Exercise Prescription - 08/22/16 1100      Date of Initial Exercise RX and Referring Provider   Date 08/22/16   Referring Provider [name] MD     Treadmill   MPH 2.8   Grade 1   Minutes 10   METs 3.53     Bike   Level 0.8   Minutes 10   METs 2.59     NuStep   Level 3   Minutes 10   METs 2     Prescription Details   Frequency (times per week) 3   Duration Progress to 30 minutes of continuous aerobic without signs/symptoms of physical distress     Intensity   THRR 40-80% of Max Heartrate 64-127   Ratings of Perceived Exertion 11-13   Perceived Dyspnea 0-4     Progression   Progression Continue progressive overload as per policy without signs/symptoms or physical distress.     Resistance Training   Training Prescription Yes   Weight 3lbs   Reps 10-12      Perform Capillary Blood Glucose checks as needed.  Exercise Prescription Changes:     Exercise Prescription Changes    Row Name 09/27/16 1400 10/19/16 1500           Response to Exercise   Blood Pressure (Admit) 130/82 122/80      Blood Pressure (Exercise) 120/78 132/66      Blood Pressure (Exit) 112/82 113/79      Heart Rate (Admit) 62 bpm 64 bpm      Heart Rate (Exercise) 89 bpm 107 bpm      Heart Rate (Exit) 71 bpm 64 bpm      Rating of Perceived Exertion (Exercise) 11 11      Duration Progress to 45 minutes of aerobic exercise without signs/symptoms of physical distress Progress to 45  minutes of aerobic exercise without signs/symptoms of physical distress      Intensity THRR unchanged THRR unchanged  Progression   Progression Continue to progress workloads to maintain intensity without signs/symptoms of physical distress. Continue to progress workloads to maintain intensity without signs/symptoms of physical distress.      Average METs 3.1 3.43        Resistance Training   Training Prescription Yes Yes      Weight 3lbs 9lb      Reps 10-15 10-15        Treadmill   MPH 3 3      Grade 2 2      Minutes 10 10      METs 4.12 4.12        Bike   Level 0.8 1.3      Minutes 10 10      METs 2.59 3.58        NuStep   Level 4 4      Minutes 10 10      METs 2.5 2.6        Home Exercise Plan   Plans to continue exercise at Home (comment) Home (comment)      Frequency Add 3 additional days to program exercise sessions. Add 3 additional days to program exercise sessions.      Initial Home Exercises Provided 09/06/16 09/06/16         Exercise Comments:     Exercise Comments    Row Name 09/27/16 1414 10/18/16 1619         Exercise Comments Reviewed METs and goals with pt.  Pt is doing great with exercise  Reviewed METs and goals with pt.  Pt is doing great with exercise          Exercise Goals and Review:     Exercise Goals    Row Name 09/27/16 1408             Exercise Goals   Increase Physical Activity Yes       Intervention Provide advice, education, support and counseling about physical activity/exercise needs.;Develop an individualized exercise prescription for aerobic and resistive training based on initial evaluation findings, risk stratification, comorbidities and participant's personal goals.       Expected Outcomes Achievement of increased cardiorespiratory fitness and enhanced flexibility, muscular endurance and strength shown through measurements of functional capacity and personal statement of participant.       Increase Strength and  Stamina Yes       Intervention Provide advice, education, support and counseling about physical activity/exercise needs.;Develop an individualized exercise prescription for aerobic and resistive training based on initial evaluation findings, risk stratification, comorbidities and participant's personal goals.       Expected Outcomes Achievement of increased cardiorespiratory fitness and enhanced flexibility, muscular endurance and strength shown through measurements of functional capacity and personal statement of participant.          Exercise Goals Re-Evaluation :     Exercise Goals Re-Evaluation    Row Name 09/27/16 1408 10/18/16 1618 10/19/16 1520         Exercise Goal Re-Evaluation   Exercise Goals Review Increase Physical Activity;Increase Strenth and Stamina Increase Physical Activity;Increase Strenth and Stamina Increase Physical Activity;Increase Strenth and Stamina     Comments Pt states he is currently exercising at the [employer] gym outside of [place] and he feels much stronger since beginning a regular exercise routine.   Pt is doing well with exercise and tolerating workload increase very well.  He is currently exercising at home in his treadmill 53min/day and he goes to the  [employer]. 2days/week to do strength training.   -     Expected Outcomes Continue with exercise prescription in CR and HEP in order to continue to increase exercise tolerance, which will lead to an overall increase in cardiorespiratory fitness.   Continue with exercise prescription in CR and HEP in order to continue to increase exercise tolerance, which will lead to an overall increase in cardiorespiratory fitness.    -         Discharge Exercise Prescription (Final Exercise Prescription Changes):     Exercise Prescription Changes - 10/19/16 1500      Response to Exercise   Blood Pressure (Admit) 122/80   Blood Pressure (Exercise) 132/66   Blood Pressure (Exit) 113/79   Heart Rate (Admit) 64 bpm   Heart  Rate (Exercise) 107 bpm   Heart Rate (Exit) 64 bpm   Rating of Perceived Exertion (Exercise) 11   Duration Progress to 45 minutes of aerobic exercise without signs/symptoms of physical distress   Intensity THRR unchanged     Progression   Progression Continue to progress workloads to maintain intensity without signs/symptoms of physical distress.   Average METs 3.43     Resistance Training   Training Prescription Yes   Weight 9lb   Reps 10-15     Treadmill   MPH 3   Grade 2   Minutes 10   METs 4.12     Bike   Level 1.3   Minutes 10   METs 3.58     NuStep   Level 4   Minutes 10   METs 2.6     Home Exercise Plan   Plans to continue exercise at Home (comment)   Frequency Add 3 additional days to program exercise sessions.   Initial Home Exercises Provided 09/06/16      Nutrition:  Target Goals: Understanding of nutrition guidelines, daily intake of sodium 1500mg , cholesterol 200mg , calories 30% from fat and 7% or less from saturated fats, daily to have 5 or more servings of fruits and vegetables.  Biometrics:     Pre Biometrics - 08/22/16 1353      Pre Biometrics   Height 6' 1.25" (1.861 m)   Weight 212 lb 8.4 oz (96.4 kg)   BMI (Calculated) 27.9       Nutrition Therapy Plan and Nutrition Goals:     Nutrition Therapy & Goals - 08/28/16 1104      Nutrition Therapy   Diet Therapeutic Lifestyle Changes     Personal Nutrition Goals   Nutrition Goal Wt maintenance during Cardiac Rehab per pt     Intervention Plan   Intervention Prescribe, educate and counsel regarding individualized specific dietary modifications aiming towards targeted core components such as weight, hypertension, lipid management, diabetes, heart failure and other comorbidities.   Expected Outcomes Short Term Goal: Understand basic principles of dietary content, such as calories, fat, sodium, cholesterol and nutrients.;Long Term Goal: Adherence to prescribed nutrition plan.       Nutrition Discharge: Nutrition Scores:     Nutrition Assessments - 08/28/16 1104      MEDFICTS Scores   Pre Score 13      Nutrition Goals Re-Evaluation:   Nutrition Goals Re-Evaluation:   Nutrition Goals Discharge (Final Nutrition Goals Re-Evaluation):   Psychosocial: Target Goals: Acknowledge presence or absence of significant depression and/or stress, maximize coping skills, provide positive support system. Participant is able to verbalize types and ability to use techniques and skills needed for reducing stress and depression.  Initial Review & Psychosocial Screening:     Initial Psych Review & Screening - 08/22/16 [place]? Yes   Comments brief assessment reveals no further needs or interventions at this time     Barriers   Psychosocial barriers to participate in program There are no identifiable barriers or psychosocial needs.     Screening Interventions   Interventions Encouraged to exercise      Quality of Life Scores:     Quality of Life - 08/22/16 1135      Quality of Life Scores   Health/Function Pre 24.75 %   Socioeconomic Pre 28.33 %   Psych/Spiritual Pre 23.57 %   Family Pre 27 %   GLOBAL Pre 25.47 %      PHQ-9: Recent Review Flowsheet Data    Depression screen [facility] 2/9 08/28/2016   Decreased Interest 0   Down, Depressed, Hopeless 0   PHQ - 2 Score 0     Interpretation of Total Score  Total Score Depression Severity:  1-4 = Minimal depression, 5-9 = Mild depression, 10-14 = Moderate depression, 15-19 = Moderately severe depression, 20-27 = Severe depression   Psychosocial Evaluation and Intervention:   Psychosocial Re-Evaluation:     Psychosocial Re-Evaluation    [place] Name 09/28/16 1428 10/25/16 1653           Psychosocial Re-Evaluation   Current issues with None Identified None Identified;Current Stress Concerns      Interventions Encouraged to attend Cardiac Rehabilitation for the  exercise Stress management education;Encouraged to attend Cardiac Rehabilitation for the exercise      Continue Psychosocial Services  No Follow up required No Follow up required        Initial Review   Source of Stress Concerns  - Financial  Jarvis reported that he has had some recent stress regarding recent bills         Psychosocial Discharge (Final Psychosocial Re-Evaluation):     Psychosocial Re-Evaluation - 10/25/16 1653      Psychosocial Re-Evaluation   Current issues with None Identified;Current Stress Concerns   Interventions Stress management education;Encouraged to attend Cardiac Rehabilitation for the exercise   Continue Psychosocial Services  No Follow up required     Initial Review   Source of Stress Concerns Souris reported that he has had some recent stress regarding recent bills      Vocational Rehabilitation: Provide vocational rehab assistance to qualifying candidates.   Vocational Rehab Evaluation & Intervention:     Vocational Rehab - 08/22/16 1348      Initial Vocational Rehab Evaluation & Intervention   Assessment shows need for Vocational Rehabilitation No  Mr Mainwaring is retired and does not need vocational rehab at this time.      Education: Education Goals: Education classes will be provided on a weekly basis, covering required topics. Participant will state understanding/return demonstration of topics presented.  Learning Barriers/Preferences:     Learning Barriers/Preferences - 08/22/16 1140      Learning Barriers/Preferences   Learning Barriers None   Learning Preferences Audio;Pictoral;Video      Education Topics: Count Your Pulse:  -Group instruction provided by verbal instruction, demonstration, patient participation and written materials to support subject.  Instructors address importance of being able to find your pulse and how to count your pulse when at home without a heart monitor.  Patients get hands on  experience counting their pulse with staff help and individually.  Heart Attack, Angina, and Risk Factor Modification:  -Group instruction provided by verbal instruction, video, and written materials to support subject.  Instructors address signs and symptoms of angina and heart attacks.    Also discuss risk factors for heart disease and how to make changes to improve heart health risk factors.   CARDIAC REHAB PHASE II EXERCISE from 10/25/2016 in [place]  Date  10/25/16  Instruction Review Code  2- meets goals/outcomes      Functional Fitness:  -Group instruction provided by verbal instruction, demonstration, patient participation, and written materials to support subject.  Instructors address safety measures for doing things around the house.  Discuss how to get up and down off the floor, how to pick things up properly, how to safely get out of a chair without assistance, and balance training.   CARDIAC REHAB PHASE II EXERCISE from 10/25/2016 in [place]  Date  10/13/16  Instruction Review Code  2- meets goals/outcomes      Meditation and Mindfulness:  -Group instruction provided by verbal instruction, patient participation, and written materials to support subject.  Instructor addresses importance of mindfulness and meditation practice to help reduce stress and improve awareness.  Instructor also leads participants through a meditation exercise.    CARDIAC REHAB PHASE II EXERCISE from 10/25/2016 in [place]  Date  09/20/16  Instruction Review Code  2- meets goals/outcomes      Stretching for Flexibility and Mobility:  -Group instruction provided by verbal instruction, patient participation, and written materials to support subject.  Instructors lead participants through series of stretches that are designed to increase flexibility thus improving mobility.  These stretches are additional  exercise for major muscle groups that are typically performed during regular warm up and cool down.   CARDIAC REHAB PHASE II EXERCISE from 10/25/2016 in [place]  Date  10/20/16  Instruction Review Code  R- Review/reinforce      Hands Only CPR Anytime:  -Group instruction provided by verbal instruction, video, patient participation and written materials to support subject.  Instructors co-teach with [facility] video for hands only CPR.  Participants get hands on experience with mannequins.   Nutrition I class: Heart Healthy Eating:  -Group instruction provided by PowerPoint slides, verbal discussion, and written materials to support subject matter. The instructor gives an explanation and review of the Therapeutic Lifestyle Changes diet recommendations, which includes a discussion on lipid goals, dietary fat, sodium, fiber, plant stanol/sterol esters, sugar, and the components of a well-balanced, healthy diet.   CARDIAC REHAB PHASE II EXERCISE from 10/25/2016 in [place]  Date  09/05/16  Educator  RD  Instruction Review Code  2- meets goals/outcomes      Nutrition II class: Lifestyle Skills:  -Group instruction provided by PowerPoint slides, verbal discussion, and written materials to support subject matter. The instructor gives an explanation and review of label reading, grocery shopping for heart health, heart healthy recipe modifications, and ways to make healthier choices when eating out.   Diabetes Question & Answer:  -Group instruction provided by PowerPoint slides, verbal discussion, and written materials to support subject matter. The instructor gives an explanation and review of diabetes co-morbidities, pre- and post-prandial blood glucose goals, pre-exercise blood glucose goals, signs, symptoms, and treatment of hypoglycemia and hyperglycemia, and foot care basics.   Diabetes Blitz:  -Group instruction provided by  [employer], verbal discussion, and  written materials to support subject matter. The instructor gives an explanation and review of the physiology behind type 1 and type 2 diabetes, diabetes medications and rational behind using different medications, pre- and post-prandial blood glucose recommendations and Hemoglobin A1c goals, diabetes diet, and exercise including blood glucose guidelines for exercising safely.    Portion Distortion:  -Group instruction provided by PowerPoint slides, verbal discussion, written materials, and food models to support subject matter. The instructor gives an explanation of serving size versus portion size, changes in portions sizes over the last 20 years, and what consists of a serving from each food group.   CARDIAC REHAB PHASE II EXERCISE from 10/25/2016 in [place]  Date  09/27/16  Educator  RD  Instruction Review Code  2- meets goals/outcomes      Stress Management:  -Group instruction provided by verbal instruction, video, and written materials to support subject matter.  Instructors review role of stress in heart disease and how to cope with stress positively.     CARDIAC REHAB PHASE II EXERCISE from 10/25/2016 in [place]  Date  10/04/16  Instruction Review Code  2- meets goals/outcomes      Exercising on Your Own:  -Group instruction provided by verbal instruction, power point, and written materials to support subject.  Instructors discuss benefits of exercise, components of exercise, frequency and intensity of exercise, and end points for exercise.  Also discuss use of nitroglycerin and activating EMS.  Review options of places to exercise outside of rehab.  Review guidelines for sex with heart disease.   CARDIAC REHAB PHASE II EXERCISE from 10/25/2016 in [place]  Date  08/30/16  Educator  Cleda Mccreedy  Instruction Review Code  2- meets  goals/outcomes      Cardiac Drugs I:  -Group instruction provided by verbal instruction and written materials to support subject.  Instructor reviews cardiac drug classes: antiplatelets, anticoagulants, beta blockers, and statins.  Instructor discusses reasons, side effects, and lifestyle considerations for each drug class.   CARDIAC REHAB PHASE II EXERCISE from 10/25/2016 in [place]  Date  09/13/16  Instruction Review Code  2- meets goals/outcomes      Cardiac Drugs II:  -Group instruction provided by verbal instruction and written materials to support subject.  Instructor reviews cardiac drug classes: angiotensin converting enzyme inhibitors (ACE-I), angiotensin II receptor blockers (ARBs), nitrates, and calcium channel blockers.  Instructor discusses reasons, side effects, and lifestyle considerations for each drug class.   Anatomy and Physiology of the Circulatory System:  -Group instruction provided by verbal instruction, video, and written materials to support subject.  Reviews functional anatomy of heart, how it relates to various diagnoses, and what role the heart plays in the overall system.   CARDIAC REHAB PHASE II EXERCISE from 10/25/2016 in [place]  Date  09/06/16  Instruction Review Code  2- meets goals/outcomes      Knowledge Questionnaire Score:     Knowledge Questionnaire Score - 08/22/16 1134      Knowledge Questionnaire Score   Pre Score 21/24      Core Components/Risk Factors/Patient Goals at Admission:     Personal Goals and Risk Factors at Admission - 08/22/16 0831      Core Components/Risk Factors/Patient Goals on Admission    Weight Management Weight Maintenance   Intervention Weight Management: Develop a combined nutrition and exercise program designed to reach  desired caloric intake, while maintaining appropriate intake of nutrient and fiber, sodium and fats, and appropriate energy  expenditure required for the weight goal.;Weight Management: Provide education and appropriate resources to help participant work on and attain dietary goals.;Weight Management/Obesity: Establish reasonable short term and long term weight goals.;Obesity: Provide education and appropriate resources to help participant work on and attain dietary goals.   Expected Outcomes Short Term: Continue to assess and modify interventions until short term weight is achieved;Weight Maintenance: Understanding of the daily nutrition guidelines, which includes 25-35% calories from fat, 7% or less cal from saturated fats, less than 200mg  cholesterol, less than 1.5gm of sodium, & 5 or more servings of fruits and vegetables daily;Understanding recommendations for meals to include 15-35% energy as protein, 25-35% energy from fat, 35-60% energy from carbohydrates, less than 200mg  of dietary cholesterol, 20-35 gm of total fiber daily;Understanding of distribution of calorie intake throughout the day with the consumption of 4-5 meals/snacks   Increase Strength and Stamina Yes   Intervention Provide advice, education, support and counseling about physical activity/exercise needs.;Develop an individualized exercise prescription for aerobic and resistive training based on initial evaluation findings, risk stratification, comorbidities and participant's personal goals.   Expected Outcomes Achievement of increased cardiorespiratory fitness and enhanced flexibility, muscular endurance and strength shown through measurements of functional capacity and personal statement of participant.   Hypertension Yes   Intervention Provide education on lifestyle modifcations including regular physical activity/exercise, weight management, moderate sodium restriction and increased consumption of fresh fruit, vegetables, and low fat dairy, alcohol moderation, and smoking cessation.;Monitor prescription use compliance.   Expected Outcomes Short Term:  Continued assessment and intervention until BP is < 140/34mm HG in hypertensive participants. < 130/70mm HG in hypertensive participants with diabetes, heart failure or chronic kidney disease.;Long Term: Maintenance of blood pressure at goal levels.   Lipids Yes   Intervention Provide education and support for participant on nutrition & aerobic/resistive exercise along with prescribed medications to achieve LDL 70mg , HDL >40mg .   Expected Outcomes Short Term: Participant states understanding of desired cholesterol values and is compliant with medications prescribed. Participant is following exercise prescription and nutrition guidelines.;Long Term: Cholesterol controlled with medications as prescribed, with individualized exercise RX and with personalized nutrition plan. Value goals: LDL < 70mg , HDL > 40 mg.   Personal Goal Other Yes   Personal Goal Get back to somewhat normal self and get healthy. Back to walking and playing golf (after shoulder surgery)    Intervention Provide exercise programming to assist with improving cardiorespiratory fitness and exercise tolerance. Provide cardiac education classes to improve confidence and awareness with living with CVD.   Expected Outcomes Pt will be able to walk and return to golf (post shoulder surgery) Pt will also improve confidence with living with  CVD      Core Components/Risk Factors/Patient Goals Review:    Core Components/Risk Factors/Patient Goals at Discharge (Final Review):    ITP Comments:     ITP Comments    Row Name 08/22/16 0825           ITP Comments Dr. Fransico Him, Medical Director          Comments: [name] is making expected progress toward personal goals after completing 25 sessions. Recommend continued exercise and life style modification education including  stress management and relaxation techniques to decrease cardiac risk profile. Will continue to monitor ECG rhythm and vial signs.Barnet Pall, RN,BSN 10/25/2016  5:01 PM

## 2016-10-26 MED ORDER — CARVEDILOL 12.5 MG PO TABS: ORAL_TABLET | ORAL | 6 refills | Status: DC

## 2016-10-26 NOTE — Telephone Encounter (Signed)
Spoke to patient  Patient verbalized understanding the increase of medication for morning dose and keep same dose in the evening.  New prescription e-sent to pharmacy.  Patient will let cardiac rehab know of changes

## 2016-10-26 NOTE — Telephone Encounter (Signed)
Let's see how he does if we increase his morning dose of carvedilol to 1-1/2 tablets  [name], MD

## 2016-10-27 ENCOUNTER — Encounter (HOSPITAL_COMMUNITY)
Admission: RE | Admit: 2016-10-27 | Discharge: 2016-10-27 | Disposition: A | Discharge status: Home / Self Care | Payer: Commercial Managed Care - HMO | Source: Ambulatory Visit | Attending: Cardiology | Admitting: Cardiology

## 2016-10-27 DIAGNOSIS — Z955 Presence of coronary angioplasty implant and graft: Secondary | ICD-10-CM

## 2016-10-27 DIAGNOSIS — I2121 ST elevation (STEMI) myocardial infarction involving left circumflex coronary artery: Secondary | ICD-10-CM

## 2016-10-27 NOTE — Telephone Encounter (Signed)
Spoke Terry Wright- cardiac rehab- aware of changes in medications

## 2016-10-30 ENCOUNTER — Encounter (HOSPITAL_COMMUNITY)
Admission: RE | Admit: 2016-10-30 | Discharge: 2016-10-30 | Disposition: A | Discharge status: Home / Self Care | Payer: Commercial Managed Care - HMO | Source: Ambulatory Visit | Attending: Cardiology | Admitting: Cardiology

## 2016-10-30 ENCOUNTER — Telehealth: Payer: Self-pay | Admitting: Cardiology

## 2016-10-30 DIAGNOSIS — Z955 Presence of coronary angioplasty implant and graft: Secondary | ICD-10-CM | POA: Insufficient documentation

## 2016-10-30 DIAGNOSIS — I2121 ST elevation (STEMI) myocardial infarction involving left circumflex coronary artery: Secondary | ICD-10-CM

## 2016-10-30 NOTE — Telephone Encounter (Signed)
Pt notified there are current fasting lab orders in computer

## 2016-10-30 NOTE — Telephone Encounter (Signed)
Pt needs a lab order,he was instructed to go to the lab in April. Please send the order to [facility] let him know when this is complete.

## 2016-11-01 ENCOUNTER — Telehealth: Payer: Self-pay | Admitting: Cardiology

## 2016-11-01 ENCOUNTER — Encounter (HOSPITAL_COMMUNITY)
Admission: RE | Admit: 2016-11-01 | Discharge: 2016-11-01 | Disposition: A | Discharge status: Home / Self Care | Payer: Commercial Managed Care - HMO | Source: Ambulatory Visit | Attending: Cardiology | Admitting: Cardiology

## 2016-11-01 DIAGNOSIS — Z955 Presence of coronary angioplasty implant and graft: Secondary | ICD-10-CM

## 2016-11-01 DIAGNOSIS — I2121 ST elevation (STEMI) myocardial infarction involving left circumflex coronary artery: Secondary | ICD-10-CM

## 2016-11-01 NOTE — Progress Notes (Signed)
Intermittent to frequent PVC's noted at times during exercise. Patient asymptomatic. Vital signs have been stable since Dr Ellyn Hack increased Mr Stockards coreg  Dose.Trixie Dredge RN called and notified. Will fax exercise flow sheets to Dr. Allison Quarry office for review.Barnet Pall, RN,BSN 11/01/2016 5:41 PM

## 2016-11-01 NOTE — Telephone Encounter (Signed)
Spoke to [facility]  medication was carvedilol increased last week   reported patient had frequent pvc's and bigemy  At today's  Class no other symptoms will be sending  Report .   will defer to dr harding to se if patient needs be elevated sooner than July 2018

## 2016-11-02 NOTE — Telephone Encounter (Signed)
Plan - increase coreg to 18.75 BID  If no SX - can monitor. [place]

## 2016-11-03 ENCOUNTER — Telehealth: Payer: Self-pay | Admitting: Cardiology

## 2016-11-03 ENCOUNTER — Encounter (HOSPITAL_COMMUNITY)
Admission: RE | Admit: 2016-11-03 | Discharge: 2016-11-03 | Disposition: A | Discharge status: Home / Self Care | Payer: Commercial Managed Care - HMO | Source: Ambulatory Visit | Attending: Cardiology | Admitting: Cardiology

## 2016-11-03 DIAGNOSIS — Z789 Other specified health status: Secondary | ICD-10-CM | POA: Diagnosis not present

## 2016-11-03 DIAGNOSIS — I2121 ST elevation (STEMI) myocardial infarction involving left circumflex coronary artery: Secondary | ICD-10-CM

## 2016-11-03 DIAGNOSIS — E785 Hyperlipidemia, unspecified: Secondary | ICD-10-CM | POA: Diagnosis not present

## 2016-11-03 DIAGNOSIS — Z955 Presence of coronary angioplasty implant and graft: Secondary | ICD-10-CM | POA: Diagnosis not present

## 2016-11-03 DIAGNOSIS — I213 ST elevation (STEMI) myocardial infarction of unspecified site: Secondary | ICD-10-CM | POA: Diagnosis not present

## 2016-11-03 DIAGNOSIS — I255 Ischemic cardiomyopathy: Secondary | ICD-10-CM | POA: Diagnosis not present

## 2016-11-03 MED ORDER — CARVEDILOL 12.5 MG PO TABS: ORAL_TABLET | ORAL | 6 refills | Status: DC

## 2016-11-03 NOTE — Telephone Encounter (Signed)
Spoke with the patient. He stated that after his morning dose of Carvedilol (18.75mg ) he starts to feel palpitations. This has gotten worse since the dosage increase on 3/28. He did state that it tends to calm down as the day goes on. He is not having any problems with the evening dose. His blood pressure today after the Carvedilol was 103/68. He does have cardiac rehab today at 2:30. Please advise. Thank you.

## 2016-11-03 NOTE — Telephone Encounter (Signed)
LEFT MESSAGE TO CALL BACK

## 2016-11-03 NOTE — Telephone Encounter (Signed)
This depends on what dose of carvedilol he is actually taking in the morning.  If he is hypotensive after taking 18.75 morning, we can just keep 12.5 mg morning and 18.7 5 at night. He does have PVCs and occasional bigeminy, but usually is considered benign. If he is not having symptoms of those during cardiac rehabilitation, I'm not going to have him worry about them.  [name], MD

## 2016-11-03 NOTE — Telephone Encounter (Signed)
New message     Per pt after the morning dose of Coreg (since it was increased) he is having more heart palpitations, it seems to be getting worse with higher the dose

## 2016-11-03 NOTE — Telephone Encounter (Signed)
SEE OTHER TELEPHONE NOTE DR HARDING CHANGE DIRECTION FOR PATIENT  CHANGE  TAKE 18.75 MG IN THE EVENING INSTEAD OF MORNING. AND 12.5 MG IN THE MORNING INSTEAD OF EVENING.   PATIENT AWARE AND CARDIAC REHAB AWARE  PATIENT STATES NO SYMPTOMS

## 2016-11-03 NOTE — Telephone Encounter (Signed)
  DR HARDING CHANGE DIRECTION FOR PATIENT  CHANGE  TAKE 18.75 MG IN THE EVENING INSTEAD OF MORNING. AND 12.5 MG IN THE MORNING INSTEAD OF EVENING.   PATIENT AWARE AND CARDIAC REHAB AWARE  PATIENT STATES NO SYMPTOMS  Change made on medication list

## 2016-11-04 LAB — COMPREHENSIVE METABOLIC PANEL
ALT: 30 IU/L (ref 0–44)
AST: 19 IU/L (ref 0–40)
Albumin/Globulin Ratio: 1.7 (ref 1.2–2.2)
Albumin: 4.3 g/dL (ref 3.6–4.8)
Alkaline Phosphatase: 71 IU/L (ref 39–117)
BUN/Creatinine Ratio: 23 (ref 10–24)
BUN: 22 mg/dL (ref 8–27)
Bilirubin Total: 0.6 mg/dL (ref 0.0–1.2)
CO2: 26 mmol/L (ref 18–29)
Calcium: 9.1 mg/dL (ref 8.6–10.2)
Chloride: 104 mmol/L (ref 96–106)
Creatinine, Ser: 0.96 mg/dL (ref 0.76–1.27)
GFR calc Af Amer: 98 mL/min/{1.73_m2} (ref >59)
GFR calc non Af Amer: 85 mL/min/{1.73_m2} (ref >59)
Globulin, Total: 2.5 g/dL (ref 1.5–4.5)
Glucose: 128 mg/dL — ABNORMAL HIGH (ref 65–99)
Potassium: 4.7 mmol/L (ref 3.5–5.2)
Sodium: 144 mmol/L (ref 134–144)
Total Protein: 6.8 g/dL (ref 6.0–8.5)

## 2016-11-04 LAB — LIPID PANEL
Chol/HDL Ratio: 3.5 ratio (ref 0.0–5.0)
Cholesterol, Total: 101 mg/dL (ref 100–199)
HDL: 29 mg/dL — ABNORMAL LOW (ref >39)
LDL Calculated: 50 mg/dL (ref 0–99)
Triglycerides: 111 mg/dL (ref 0–149)
VLDL Cholesterol Cal: 22 mg/dL (ref 5–40)

## 2016-11-06 ENCOUNTER — Telehealth: Payer: Self-pay | Admitting: Cardiology

## 2016-11-06 ENCOUNTER — Encounter (HOSPITAL_COMMUNITY)
Admission: RE | Admit: 2016-11-06 | Discharge: 2016-11-06 | Disposition: A | Discharge status: Home / Self Care | Payer: Commercial Managed Care - HMO | Source: Ambulatory Visit | Attending: Cardiology | Admitting: Cardiology

## 2016-11-06 DIAGNOSIS — I2121 ST elevation (STEMI) myocardial infarction involving left circumflex coronary artery: Secondary | ICD-10-CM

## 2016-11-06 DIAGNOSIS — Z955 Presence of coronary angioplasty implant and graft: Secondary | ICD-10-CM

## 2016-11-06 NOTE — Telephone Encounter (Signed)
See Maria's progress note from today's cardiac rehab visit for assessment including today's BP and HR measurements.  Pt of Dr. Ellyn Hack followed in cardiac rehab, several recent tweaks to coreg. Spoke to patient. He's still having problems with his coreg. Recently, he was taking 18.75mg  in the AM and 12.5mg  in PM, was having symptoms of increased palps with the morning dose. Was recommended to switch the doses. Has now been taking the 18.75mg  in evening and having same symptoms he'd been having in the morning.  Pt notes, encouragingly, he is asymptomatic after his 12.5mg  am dose.  Has had borderline low BPs at home (90s-100s) but does not recall HR readings. Last reading he got at home was 103/68.  He takes his meds roughly 12 hrs apart, 7am and 6:30pm.  Dr. Ellyn Hack out for the day and other providers gone. I advised pt probably reasonable to take 12.5mg  coreg tonight instead of 18.75, see if this improves his symptoms of palpitations. Informed him I'd send to Dr. Ellyn Hack & will discuss tomorrow so that further instructions can be given going forward. Pt agreeable to this plan and voiced thanks for call.

## 2016-11-06 NOTE — Telephone Encounter (Signed)
New message      Pt c/o medication issue:  1. Name of Medication: coreg 2. How are you currently taking this medication (dosage and times per day)? 12.5mg  3. Are you having a reaction (difficulty breathing--STAT)? no 4. What is your medication issue? Pt c/o lightheadedness after taking afternoon dosage.  Pt also c/o more palpitations.  Please call pt instead of Maria.  She will let patient go home

## 2016-11-06 NOTE — Progress Notes (Signed)
Patient reported before exercise at cardiac rehab that he is experiencing palpitations in the evening after he takes his evening dose of coreg and also reports feeling slightly lightheaded after taking his PM dose. Terry Wright says he has been checking his blood pressures at home in the evening. Terry Wright's systolic blood pressures have been above 100. Terry Wright was noted to have less ectopy today with exercise. Entry blood pressure 108/76 with a resting heart rate of 62. Max exertional heart rate of 82 with a blood pressure of 122/80. Exit blood pressure 108/72 with a resting heart of 62. Terry Wright exercised without complaints today. Dr Ellyn Hack called and notified about Terry Wright's symptoms at home.Will continue to monitor the patient throughout  the program.Dylan Ruotolo Venetia Maxon, RN,BSN 11/06/2016 4:25 PM

## 2016-11-07 NOTE — Telephone Encounter (Signed)
Just stay @ 12.5 bid.  [place]

## 2016-11-07 NOTE — Telephone Encounter (Signed)
Recommendations discussed with patient, who verbalized understanding and thanks.  

## 2016-11-08 ENCOUNTER — Encounter (HOSPITAL_COMMUNITY)
Admission: RE | Admit: 2016-11-08 | Discharge: 2016-11-08 | Disposition: A | Discharge status: Home / Self Care | Payer: Commercial Managed Care - HMO | Source: Ambulatory Visit | Attending: Cardiology | Admitting: Cardiology

## 2016-11-08 DIAGNOSIS — Z955 Presence of coronary angioplasty implant and graft: Secondary | ICD-10-CM | POA: Diagnosis not present

## 2016-11-08 DIAGNOSIS — I2121 ST elevation (STEMI) myocardial infarction involving left circumflex coronary artery: Secondary | ICD-10-CM

## 2016-11-10 ENCOUNTER — Encounter (HOSPITAL_COMMUNITY)
Admission: RE | Admit: 2016-11-10 | Discharge: 2016-11-10 | Disposition: A | Discharge status: Home / Self Care | Payer: Commercial Managed Care - HMO | Source: Ambulatory Visit | Attending: Cardiology | Admitting: Cardiology

## 2016-11-10 DIAGNOSIS — Z955 Presence of coronary angioplasty implant and graft: Secondary | ICD-10-CM | POA: Diagnosis not present

## 2016-11-10 DIAGNOSIS — I2121 ST elevation (STEMI) myocardial infarction involving left circumflex coronary artery: Secondary | ICD-10-CM

## 2016-11-11 ENCOUNTER — Encounter: Payer: Self-pay | Admitting: Cardiology

## 2016-11-13 ENCOUNTER — Encounter (HOSPITAL_COMMUNITY)
Admission: RE | Admit: 2016-11-13 | Discharge: 2016-11-13 | Disposition: A | Discharge status: Home / Self Care | Payer: Commercial Managed Care - HMO | Source: Ambulatory Visit | Attending: Cardiology | Admitting: Cardiology

## 2016-11-13 DIAGNOSIS — Z955 Presence of coronary angioplasty implant and graft: Secondary | ICD-10-CM | POA: Diagnosis not present

## 2016-11-13 DIAGNOSIS — I2121 ST elevation (STEMI) myocardial infarction involving left circumflex coronary artery: Secondary | ICD-10-CM

## 2016-11-15 ENCOUNTER — Encounter (HOSPITAL_COMMUNITY)
Admission: RE | Admit: 2016-11-15 | Discharge: 2016-11-15 | Disposition: A | Discharge status: Home / Self Care | Payer: Commercial Managed Care - HMO | Source: Ambulatory Visit | Attending: Cardiology | Admitting: Cardiology

## 2016-11-15 DIAGNOSIS — Z955 Presence of coronary angioplasty implant and graft: Secondary | ICD-10-CM | POA: Diagnosis not present

## 2016-11-15 DIAGNOSIS — I2121 ST elevation (STEMI) myocardial infarction involving left circumflex coronary artery: Secondary | ICD-10-CM

## 2016-11-17 ENCOUNTER — Encounter (HOSPITAL_COMMUNITY)
Admission: RE | Admit: 2016-11-17 | Discharge: 2016-11-17 | Disposition: A | Discharge status: Home / Self Care | Payer: Commercial Managed Care - HMO | Source: Ambulatory Visit | Attending: Cardiology | Admitting: Cardiology

## 2016-11-17 VITALS — Wt 208.8 lb

## 2016-11-17 DIAGNOSIS — Z955 Presence of coronary angioplasty implant and graft: Secondary | ICD-10-CM

## 2016-11-17 DIAGNOSIS — I2121 ST elevation (STEMI) myocardial infarction involving left circumflex coronary artery: Secondary | ICD-10-CM

## 2016-11-20 ENCOUNTER — Encounter (HOSPITAL_COMMUNITY)
Admission: RE | Admit: 2016-11-20 | Discharge: 2016-11-20 | Disposition: A | Discharge status: Home / Self Care | Payer: Commercial Managed Care - HMO | Source: Ambulatory Visit | Attending: Cardiology | Admitting: Cardiology

## 2016-11-20 DIAGNOSIS — I2121 ST elevation (STEMI) myocardial infarction involving left circumflex coronary artery: Secondary | ICD-10-CM

## 2016-11-20 DIAGNOSIS — Z955 Presence of coronary angioplasty implant and graft: Secondary | ICD-10-CM | POA: Diagnosis not present

## 2016-11-20 NOTE — Progress Notes (Signed)
Discharge Summary  Patient Details  Name: Terry Wright MRN: [id number] Date of Birth: [birth year] Referring Provider:     CARDIAC REHAB PHASE II ORIENTATION from 08/22/2016 in [place]  Referring Provider  [name] MD       Number of Visits: 36  Reason for Discharge:  Patient independent in their exercise.  Smoking History:  History  Smoking Status  . Former Smoker  . Packs/day: 1.00  . Years: 40.00  . Quit date: 2015  Smokeless Tobacco  . 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 (HCC)  S/P coronary artery stent placement  ADL UCSD:   Initial Exercise Prescription:     Initial Exercise Prescription - 08/22/16 1100      Date of Initial Exercise RX and Referring Provider   Date 08/22/16   Referring Provider [name] MD     Treadmill   MPH 2.8   Grade 1   Minutes 10   METs 3.53     Bike   Level 0.8   Minutes 10   METs 2.59     NuStep   Level 3   Minutes 10   METs 2     Prescription Details   Frequency (times per week) 3   Duration Progress to 30 minutes of continuous aerobic without signs/symptoms of physical distress     Intensity   THRR 40-80% of Max Heartrate 64-127   Ratings of Perceived Exertion 11-13   Perceived Dyspnea 0-4     Progression   Progression Continue progressive overload as per policy without signs/symptoms or physical distress.     Resistance Training   Training Prescription Yes   Weight 3lbs   Reps 10-12      Discharge Exercise Prescription (Final Exercise Prescription Changes):     Exercise Prescription Changes - 12/08/16 1200      Response to Exercise   Blood Pressure (Admit) 112/70   Blood Pressure (Exercise) 120/74   Blood Pressure (Exit) 104/70   Heart Rate (Admit) 66 bpm   Heart Rate (Exercise) 86 bpm   Heart Rate (Exit) 61 bpm   Rating of Perceived Exertion (Exercise) 10   Duration  Progress to 45 minutes of aerobic exercise without signs/symptoms of physical distress   Intensity THRR unchanged     Progression   Progression Continue to progress workloads to maintain intensity without signs/symptoms of physical distress.   Average METs 3.44     Resistance Training   Training Prescription Yes   Weight 9lb   Reps 10-15     Treadmill   MPH 3   Grade 2   Minutes 10   METs 4.12     Bike   Level 1.3   Minutes 10   METs 3.59     NuStep   Level 5   Minutes 10   METs 2.6     Home Exercise Plan   Plans to continue exercise at Home (comment)   Frequency Add 3 additional days to program exercise sessions.   Initial Home Exercises Provided 09/06/16      Functional Capacity:     6 Minute Walk    Row Name 08/22/16 1141 12/08/16 1218       6 Minute Walk   Phase Initial Discharge    Distance 1625 feet 1693 feet    Distance % Change  - 4.1 %    Walk Time 6 minutes 6 minutes    #  of Rest Breaks 0 0    MPH 3.1 32    METS 3.8 4    RPE 9 10    VO2 Peak 13.3 14    Symptoms No No    Resting HR 77 bpm 63 bpm    Resting BP 104/64 116/78    Max Ex. HR 85 bpm 82 bpm    Max Ex. BP 118/70 128/76    2 Minute Post BP 98/60 108/76       Psychological, QOL, Others - Outcomes: PHQ 2/9: Depression screen [facility] 2/9 11/20/2016 08/28/2016  Decreased Interest 0 0  Down, Depressed, Hopeless 0 0  PHQ - 2 Score 0 0    Quality of Life:     Quality of Life - 12/08/16 1227      Quality of Life Scores   Health/Function Pre 24.75 %   Health/Function Post 26.89 %   Health/Function % Change 8.65 %   Socioeconomic Pre 28.33 %   Socioeconomic Post 27.25 %   Socioeconomic % Change  -3.81 %   Psych/Spiritual Pre 23.57 %   Psych/Spiritual Post 29.14 %   Psych/Spiritual % Change 23.63 %   Family Pre 27 %   Family Post 28.5 %   Family % Change 5.56 %   GLOBAL Pre 25.47 %   GLOBAL Post 27.65 %   GLOBAL % Change 8.56 %      Personal Goals: Goals established at  orientation with interventions provided to work toward goal.     Personal Goals and Risk Factors at Admission - 08/22/16 0831      Core Components/Risk Factors/Patient Goals on Admission    Weight Management Weight Maintenance   Intervention Weight Management: Develop a combined nutrition and exercise program designed to reach desired caloric intake, while maintaining appropriate intake of nutrient and fiber, sodium and fats, and appropriate energy expenditure required for the weight goal.;Weight Management: Provide education and appropriate resources to help participant work on and attain dietary goals.;Weight Management/Obesity: Establish reasonable short term and long term weight goals.;Obesity: Provide education and appropriate resources to help participant work on and attain dietary goals.   Expected Outcomes Short Term: Continue to assess and modify interventions until short term weight is achieved;Weight Maintenance: Understanding of the daily nutrition guidelines, which includes 25-35% calories from fat, 7% or less cal from saturated fats, less than 261m cholesterol, less than 1.5gm of sodium, & 5 or more servings of fruits and vegetables daily;Understanding recommendations for meals to include 15-35% energy as protein, 25-35% energy from fat, 35-60% energy from carbohydrates, less than 2052mof dietary cholesterol, 20-35 gm of total fiber daily;Understanding of distribution of calorie intake throughout the day with the consumption of 4-5 meals/snacks   Increase Strength and Stamina Yes   Intervention Provide advice, education, support and counseling about physical activity/exercise needs.;Develop an individualized exercise prescription for aerobic and resistive training based on initial evaluation findings, risk stratification, comorbidities and participant's personal goals.   Expected Outcomes Achievement of increased cardiorespiratory fitness and enhanced flexibility, muscular endurance and  strength shown through measurements of functional capacity and personal statement of participant.   Hypertension Yes   Intervention Provide education on lifestyle modifcations including regular physical activity/exercise, weight management, moderate sodium restriction and increased consumption of fresh fruit, vegetables, and low fat dairy, alcohol moderation, and smoking cessation.;Monitor prescription use compliance.   Expected Outcomes Short Term: Continued assessment and intervention until BP is < 140/9083mG in hypertensive participants. < 130/83m66m in hypertensive participants with  diabetes, heart failure or chronic kidney disease.;Long Term: Maintenance of blood pressure at goal levels.   Lipids Yes   Intervention Provide education and support for participant on nutrition & aerobic/resistive exercise along with prescribed medications to achieve LDL <29m, HDL >453m   Expected Outcomes Short Term: Participant states understanding of desired cholesterol values and is compliant with medications prescribed. Participant is following exercise prescription and nutrition guidelines.;Long Term: Cholesterol controlled with medications as prescribed, with individualized exercise RX and with personalized nutrition plan. Value goals: LDL < 7021mHDL > 40 mg.   Personal Goal Other Yes   Personal Goal Get back to somewhat normal self and get healthy. Back to walking and playing golf (after shoulder surgery)    Intervention Provide exercise programming to assist with improving cardiorespiratory fitness and exercise tolerance. Provide cardiac education classes to improve confidence and awareness with living with CVD.   Expected Outcomes Pt will be able to walk and return to golf (post shoulder surgery) Pt will also improve confidence with living with  CVD       Personal Goals Discharge:   Nutrition & Weight - Outcomes:     Pre Biometrics - 08/22/16 1353      Pre Biometrics   Height 6' 1.25" (1.861 m)    Weight 212 lb 8.4 oz (96.4 kg)   BMI (Calculated) 27.9         Post Biometrics - 12/08/16 1226       Post  Biometrics   Weight 208 lb 12.4 oz (94.7 kg)   Waist Circumference 42.5 inches   Hip Circumference 42 inches   Waist to Hip Ratio 1.01 %   Triceps Skinfold 16.5 mm   % Body Fat 28.3 %   Grip Strength 58 kg   Flexibility 10 in   Single Leg Stand 19.25 seconds      Nutrition:     Nutrition Therapy & Goals - 08/28/16 1104      Nutrition Therapy   Diet Therapeutic Lifestyle Changes     Personal Nutrition Goals   Nutrition Goal Wt maintenance during Cardiac Rehab per pt     Intervention Plan   Intervention Prescribe, educate and counsel regarding individualized specific dietary modifications aiming towards targeted core components such as weight, hypertension, lipid management, diabetes, heart failure and other comorbidities.   Expected Outcomes Short Term Goal: Understand basic principles of dietary content, such as calories, fat, sodium, cholesterol and nutrients.;Long Term Goal: Adherence to prescribed nutrition plan.      Nutrition Discharge:     Nutrition Assessments - 12/12/16 1413      MEDFICTS Scores   Pre Score 13   Post Score 12   Score Difference -1      Education Questionnaire Score:     Knowledge Questionnaire Score - 11/20/16 1524      Knowledge Questionnaire Score   Pre Score 21/24   Post Score 20/24      Goals reviewed with patient; copy given to patient. "ChuHarrie Jeansraduated from cardiac rehab program today with completion of 36 exercise sessions in Phase II. Pt maintained good attendance and progressed nicely during his participation in rehab as evidenced by increased MET level.   Medication list reconciled. Repeat  PHQ score-0 .  Pt has made significant lifestyle changes and should be commended for his success. Pt feels he has achieved his goals during cardiac rehab.   Chuck  plans to continue exercise by exercising at the gym and on his  treadmill at  home. We are proud of Chuck's accomplishments and weight loss! Barnet Pall, RN,BSN 12/18/2016 12:33 PM

## 2016-11-22 ENCOUNTER — Encounter (HOSPITAL_COMMUNITY): Payer: Commercial Managed Care - HMO

## 2016-11-24 ENCOUNTER — Encounter (HOSPITAL_COMMUNITY): Payer: Commercial Managed Care - HMO

## 2016-11-27 ENCOUNTER — Encounter (HOSPITAL_COMMUNITY): Payer: Commercial Managed Care - HMO

## 2016-11-29 ENCOUNTER — Encounter (HOSPITAL_COMMUNITY): Payer: Commercial Managed Care - HMO

## 2016-12-01 ENCOUNTER — Encounter (HOSPITAL_COMMUNITY): Payer: Commercial Managed Care - HMO

## 2016-12-04 ENCOUNTER — Encounter (HOSPITAL_COMMUNITY): Payer: Commercial Managed Care - HMO

## 2016-12-06 ENCOUNTER — Encounter (HOSPITAL_COMMUNITY): Payer: Commercial Managed Care - HMO

## 2016-12-08 ENCOUNTER — Encounter (HOSPITAL_COMMUNITY): Payer: Commercial Managed Care - HMO

## 2016-12-08 NOTE — Addendum Note (Signed)
Encounter addended by: Meta Hatchet on: 12/08/2016 12:33 PM<BR>    Actions taken: Flowsheet accepted, Flowsheet data copied forward, Visit Navigator Flowsheet section accepted

## 2016-12-11 ENCOUNTER — Encounter (HOSPITAL_COMMUNITY): Payer: Commercial Managed Care - HMO

## 2016-12-13 ENCOUNTER — Encounter (HOSPITAL_COMMUNITY): Payer: Commercial Managed Care - HMO

## 2016-12-15 ENCOUNTER — Encounter (HOSPITAL_COMMUNITY): Payer: Commercial Managed Care - HMO

## 2016-12-18 ENCOUNTER — Encounter (HOSPITAL_COMMUNITY): Payer: Commercial Managed Care - HMO

## 2016-12-18 NOTE — Progress Notes (Signed)
Thanks  DH 

## 2016-12-20 ENCOUNTER — Encounter (HOSPITAL_COMMUNITY): Payer: Commercial Managed Care - HMO

## 2016-12-22 ENCOUNTER — Encounter (HOSPITAL_COMMUNITY): Payer: Commercial Managed Care - HMO

## 2016-12-25 ENCOUNTER — Encounter (HOSPITAL_COMMUNITY): Payer: Commercial Managed Care - HMO

## 2017-01-11 ENCOUNTER — Ambulatory Visit (HOSPITAL_COMMUNITY): Payer: Commercial Managed Care - HMO | Attending: Internal Medicine

## 2017-01-11 ENCOUNTER — Telehealth: Payer: Self-pay | Admitting: Cardiology

## 2017-01-11 ENCOUNTER — Other Ambulatory Visit: Payer: Self-pay

## 2017-01-11 DIAGNOSIS — I517 Cardiomegaly: Secondary | ICD-10-CM | POA: Diagnosis not present

## 2017-01-11 DIAGNOSIS — E785 Hyperlipidemia, unspecified: Secondary | ICD-10-CM | POA: Diagnosis not present

## 2017-01-11 DIAGNOSIS — Z79899 Other long term (current) drug therapy: Secondary | ICD-10-CM

## 2017-01-11 DIAGNOSIS — I213 ST elevation (STEMI) myocardial infarction of unspecified site: Secondary | ICD-10-CM | POA: Insufficient documentation

## 2017-01-11 DIAGNOSIS — I340 Nonrheumatic mitral (valve) insufficiency: Secondary | ICD-10-CM | POA: Insufficient documentation

## 2017-01-11 DIAGNOSIS — Z789 Other specified health status: Secondary | ICD-10-CM | POA: Insufficient documentation

## 2017-01-11 DIAGNOSIS — I10 Essential (primary) hypertension: Secondary | ICD-10-CM | POA: Insufficient documentation

## 2017-01-11 DIAGNOSIS — I255 Ischemic cardiomyopathy: Secondary | ICD-10-CM | POA: Diagnosis not present

## 2017-01-11 HISTORY — PX: TRANSTHORACIC ECHOCARDIOGRAM: SHX275

## 2017-01-11 MED ORDER — ROSUVASTATIN CALCIUM 20 MG PO TABS: 20.0000 mg | ORAL_TABLET | Freq: Every day | ORAL | 3 refills | Status: DC

## 2017-01-11 NOTE — Telephone Encounter (Signed)
New message    Pt c/o medication issue:  1. Name of Medication: atorvastatin 40 mg  2. How are you currently taking this medication (dosage and times per day)? Once daily   3. Are you having a reaction (difficulty breathing--STAT)? no  4. What is your medication issue? Pt calling stating that he is having pain in his right knee since beginning this medication. He says that the past few days he cant barely straighten it. He is asking if there is another drug he can try.

## 2017-01-11 NOTE — Telephone Encounter (Signed)
Spoke with the patient. He stated that since he has been on Atorvastatin 40 mg he has had right hip and knee pain. He stated that he has had trouble with statins in the past. Per Dr. Ellyn Hack, he should stop the Atorvastatin for two weeks and then try Crestor 20 mg daily. The patient verbalized his understanding and stated that he would call with an update if needed.

## 2017-01-18 DIAGNOSIS — M25561 Pain in right knee: Secondary | ICD-10-CM | POA: Diagnosis not present

## 2017-01-22 ENCOUNTER — Ambulatory Visit: Payer: 59 | Admitting: Family Medicine

## 2017-01-22 ENCOUNTER — Encounter: Payer: Self-pay | Admitting: Family Medicine

## 2017-01-22 ENCOUNTER — Ambulatory Visit (INDEPENDENT_AMBULATORY_CARE_PROVIDER_SITE_OTHER): Payer: 59 | Admitting: Family Medicine

## 2017-01-22 VITALS — BP 102/68 | HR 58 | Temp 97.8°F | Ht 73.0 in | Wt 206.6 lb

## 2017-01-22 DIAGNOSIS — J3489 Other specified disorders of nose and nasal sinuses: Secondary | ICD-10-CM | POA: Diagnosis not present

## 2017-01-22 DIAGNOSIS — J0100 Acute maxillary sinusitis, unspecified: Secondary | ICD-10-CM

## 2017-01-22 MED ORDER — OXYMETAZOLINE HCL 0.05 % NA SOLN: 1.0000 | Freq: Two times a day (BID) | NASAL | 0 refills | Status: AC

## 2017-01-22 MED ORDER — DOXYCYCLINE HYCLATE 100 MG PO TABS: 100.0000 mg | ORAL_TABLET | Freq: Two times a day (BID) | ORAL | 0 refills | Status: DC

## 2017-01-22 NOTE — Progress Notes (Signed)
HPI:  Acute visit for nose pain: -for several days -pain L side of nose - feels like inside -a little stuffy -no fevers, discharge, redness, bleeding  ROS: See pertinent positives and negatives per HPI.  Past Medical History:  Diagnosis Date  . CAD S/P percutaneous coronary angioplasty 08/04/2016   100% occluded proximal circumflex: PCI with 4.0 x 16 mm Synergy DES. Also noted 65% ostial OM 3 (relatively small caliber with planned medical management). EF 3545% with lateral hypokinesis.  Marland Kitchen DVT (deep venous thrombosis) (HCC)    Right upper arm DVT on 2 occasions in the remote past  . Dyslipidemia    Patient mentions triglycerides specifically  . Ejection fraction    LV function normal, echo, February, 2010  . GERD (gastroesophageal reflux disease)   . Hyperlipidemia   . Hypertension   . Mitral regurgitation    Mild, echo, February, 2010  . Palpitations    Mild in the past  . Statin intolerance    Felt poorly after Lipitor, Crestor, TriCor  . STEMI involving left circumflex coronary artery ([place]) 08/04/2016   Occluded very large caliber codominant circumflex - PCI with single DES  . Tobacco abuse     Past Surgical History:  Procedure Laterality Date  . CARDIAC CATHETERIZATION N/A 08/04/2016   Procedure: Left Heart Cath and Coronary Angiography;  Surgeon: Leonie Man, MD;  Location: [facility];  Service: Cardiovascular;  Laterality: N/A: 100% pCx --> PCI. residual 65% pOM3 (small).  ~EF 35-45%  with lateral HK.  Marland Kitchen CARDIAC CATHETERIZATION N/A 08/04/2016   Procedure: Coronary Stent Intervention;  Surgeon: Leonie Man, MD;  Location: [facility];  Service: Cardiovascular;  Laterality: N/A: pCx 100%-0%: Synergy DES 4.0 x 16 (4.5 mm)  . GANGLION CYST EXCISION     oct 2017  . HERNIA REPAIR     umbilical hernia 2017 oct  . TRANSTHORACIC ECHOCARDIOGRAM  08/05/2016   post STEMI: EF 40-45%. Mild concentric LVH. Severe HK of basal-mid inferolateral wall consistent  with infarct in this distribution. GR 1 DD    Family History  Problem Relation Age of Onset  . Heart attack Mother        x2- lived into 67s  . Hypertension Father        lived into 78s  . Other Brother        killed in [place]    Social History   Social History  . Marital status: Married    Spouse name: N/A  . Number of children: N/A  . Years of education: N/A   Social History Main Topics  . Smoking status: Former Smoker    Packs/day: 1.00    Years: 40.00    Quit date: 2015  . Smokeless tobacco: Never Used  . Alcohol use 4.2 oz/week    7 Glasses of wine per week     Comment: occasional  . Drug use: No  . Sexual activity: Not Asked   Other Topics Concern  . None   Social History Narrative   Married 10 years in 2018. 2 step kids- 58 and 61. 3 kids but he does not have contact with. No grandkids      Retired Theatre stage manager.       Hobbies: golf (needs shoulder replacement though, working in the yard, working out- was doing this regularly even before MI- walking/treadmill     Current Outpatient Prescriptions:  .  aspirin 81 MG chewable tablet, Chew 1 tablet (81 mg total)  by mouth daily., Disp: 30 tablet, Rfl: 11 .  carvedilol (COREG) 12.5 MG tablet, Take 1 tablet ( total 12.5 mg) in the morning and take1.5 tablet  (total 18.75 mg) in the evening, Disp: 75 tablet, Rfl: 6 .  LORazepam (ATIVAN) 0.5 MG tablet, Take 0.5 mg by mouth every 8 (eight) hours., Disp: , Rfl:  .  pantoprazole (PROTONIX) 40 MG tablet, Take 1 tablet (40 mg total) by mouth daily., Disp: 30 tablet, Rfl: 11 .  rosuvastatin (CRESTOR) 20 MG tablet, Take 1 tablet (20 mg total) by mouth daily., Disp: 90 tablet, Rfl: 3 .  ticagrelor (BRILINTA) 90 MG TABS tablet, Take 1 tablet (90 mg total) by mouth 2 (two) times daily., Disp: 60 tablet, Rfl: 11 .  doxycycline (VIBRA-TABS) 100 MG tablet, Take 1 tablet (100 mg total) by mouth 2 (two) times daily., Disp: 14 tablet, Rfl: 0 .  oxymetazoline (AFRIN NASAL SPRAY)  0.05 % nasal spray, Place 1 spray into both nostrils 2 (two) times daily. FOR 3-4 days only., Disp: 30 mL, Rfl: 0  EXAM:  Vitals:   01/22/17 1004  BP: 102/68  Pulse: (!) 58  Temp: 97.8 F (36.6 C)    Body mass index is 27.26 kg/m.  GENERAL: vitals reviewed and listed above, alert, oriented, appears well hydrated and in no acute distress  HEENT: atraumatic, allergic shiners, conjunttiva clear, no obvious abnormalities on inspection of external nose and ears, thick green nasal congestion bilat with bilateral boggy terbinates, L > R with mild erythema ext ant internal nasal mucosa and TTP here, no ulcer or lesions, no bleeding, no cellulitis appreciated  NECK: no obvious masses on inspection  LUNGS: clear to auscultation bilaterally, no wheezes, rales or rhonchi, good air movement  CV: HRRR, no peripheral edema  MS: moves all extremities without noticeable abnormality  PSYCH: pleasant and cooperative, no obvious depression or anxiety  ASSESSMENT AND PLAN:  Discussed the following assessment and plan:  Acute non-recurrent maxillary sinusitis  Pain, nose  -appearance of sinusitis and nasal mucosal irritation perhaps 2ndary to AR per exam -opted for doxy and short course nasal decongestant and recheck in 1 week  -no appreciable nasal mucosal lesion today but edema and nasal congestion make for a limited exam - he does have a smoking hx so close re-eval to ensure resolving and ENT eval if persistent issues is advised -Patient advised to return or notify a doctor immediately if symptoms worsen or persist or new concerns arise.  Patient Instructions  BEFORE YOU LEAVE: -follow up: 1 week  AFRIN nasal spray per instructions for 3-4 days. Do not use longer the 4 days.  Start the antibiotic (doxycicline) and take as instructed.  I hope you are feeling better soon! Seek care immediately if worsening, new concerns or you are not improving with treatment.  Doxycycline tablets or  capsules What is this medicine? DOXYCYCLINE (dox i SYE kleen) is a tetracycline antibiotic. It kills certain bacteria or stops their growth. It is used to treat many kinds of infections, like dental, skin, respiratory, and urinary tract infections. It also treats acne, Lyme disease, malaria, and certain sexually transmitted infections. This medicine may be used for other purposes; ask your health care provider or pharmacist if you have questions. COMMON BRAND NAME(S): Acticlate, Adoxa, Adoxa CK, Adoxa Pak, Adoxa TT, Alodox, Avidoxy, Doxal, Mondoxyne NL, Monodox, Morgidox 1x, Morgidox 1x Kit, Morgidox 2x, Morgidox 2x Kit, NutriDox, Ocudox, TARGADOX, Vibra-Tabs, Vibramycin What should I tell my health care provider before I take  this medicine? They need to know if you have any of these conditions: -liver disease -long exposure to sunlight like working outdoors -stomach problems like colitis -an unusual or allergic reaction to doxycycline, tetracycline antibiotics, other medicines, foods, dyes, or preservatives -pregnant or trying to get pregnant -breast-feeding How should I use this medicine? Take this medicine by mouth with a full glass of water. Follow the directions on the prescription label. It is best to take this medicine without food, but if it upsets your stomach take it with food. Take your medicine at regular intervals. Do not take your medicine more often than directed. Take all of your medicine as directed even if you think you are better. Do not skip doses or stop your medicine early. Talk to your pediatrician regarding the use of this medicine in children. While this drug may be prescribed for selected conditions, precautions do apply. Overdosage: If you think you have taken too much of this medicine contact a poison control center or emergency room at once. NOTE: This medicine is only for you. Do not share this medicine with others. What if I miss a dose? If you miss a dose, take it  as soon as you can. If it is almost time for your next dose, take only that dose. Do not take double or extra doses. What may interact with this medicine? -antacids -barbiturates -birth control pills -bismuth subsalicylate -carbamazepine -methoxyflurane -other antibiotics -phenytoin -vitamins that contain iron -warfarin This list may not describe all possible interactions. Give your health care provider a list of all the medicines, herbs, non-prescription drugs, or dietary supplements you use. Also tell them if you smoke, drink alcohol, or use illegal drugs. Some items may interact with your medicine. What should I watch for while using this medicine? Tell your doctor or health care professional if your symptoms do not improve. Do not treat diarrhea with over the counter products. Contact your doctor if you have diarrhea that lasts more than 2 days or if it is severe and watery. Do not take this medicine just before going to bed. It may not dissolve properly when you lay down and can cause pain in your throat. Drink plenty of fluids while taking this medicine to also help reduce irritation in your throat. This medicine can make you more sensitive to the sun. Keep out of the sun. If you cannot avoid being in the sun, wear protective clothing and use sunscreen. Do not use sun lamps or tanning beds/booths. Birth control pills may not work properly while you are taking this medicine. Talk to your doctor about using an extra method of birth control. If you are being treated for a sexually transmitted infection, avoid sexual contact until you have finished your treatment. Your sexual partner may also need treatment. Avoid antacids, aluminum, calcium, magnesium, and iron products for 4 hours before and 2 hours after taking a dose of this medicine. If you are using this medicine to prevent malaria, you should still protect yourself from contact with mosquitos. Stay in screened-in areas, use mosquito  nets, keep your body covered, and use an insect repellent. What side effects may I notice from receiving this medicine? Side effects that you should report to your doctor or health care professional as soon as possible: -allergic reactions like skin rash, itching or hives, swelling of the face, lips, or tongue -difficulty breathing -fever -itching in the rectal or genital area -pain on swallowing -redness, blistering, peeling or loosening of the skin, including inside  the mouth -severe stomach pain or cramps -unusual bleeding or bruising -unusually weak or tired -yellowing of the eyes or skin Side effects that usually do not require medical attention (report to your doctor or health care professional if they continue or are bothersome): -diarrhea -loss of appetite -nausea, vomiting This list may not describe all possible side effects. Call your doctor for medical advice about side effects. You may report side effects to FDA at 1-800-FDA-1088. Where should I keep my medicine? Keep out of the reach of children. Store at room temperature, below 30 degrees C (86 degrees F). Protect from light. Keep container tightly closed. Throw away any unused medicine after the expiration date. Taking this medicine after the expiration date can make you seriously ill. NOTE: This sheet is a summary. It may not cover all possible information. If you have questions about this medicine, talk to your doctor, pharmacist, or health care provider.  2018 Elsevier/Gold Standard (2015-08-18 17:11:22)     Colin Benton R., DO

## 2017-01-22 NOTE — Patient Instructions (Signed)
BEFORE YOU LEAVE: -follow up: 1 week  AFRIN nasal spray per instructions for 3-4 days. Do not use longer the 4 days.  Start the antibiotic (doxycicline) and take as instructed.  I hope you are feeling better soon! Seek care immediately if worsening, new concerns or you are not improving with treatment.  Doxycycline tablets or capsules What is this medicine? DOXYCYCLINE (dox i SYE kleen) is a tetracycline antibiotic. It kills certain bacteria or stops their growth. It is used to treat many kinds of infections, like dental, skin, respiratory, and urinary tract infections. It also treats acne, Lyme disease, malaria, and certain sexually transmitted infections. This medicine may be used for other purposes; ask your health care provider or pharmacist if you have questions. COMMON BRAND NAME(S): Acticlate, Adoxa, Adoxa CK, Adoxa Pak, Adoxa TT, Alodox, Avidoxy, Doxal, Mondoxyne NL, Monodox, Morgidox 1x, Morgidox 1x Kit, Morgidox 2x, Morgidox 2x Kit, NutriDox, Ocudox, TARGADOX, Vibra-Tabs, Vibramycin What should I tell my health care provider before I take this medicine? They need to know if you have any of these conditions: -liver disease -long exposure to sunlight like working outdoors -stomach problems like colitis -an unusual or allergic reaction to doxycycline, tetracycline antibiotics, other medicines, foods, dyes, or preservatives -pregnant or trying to get pregnant -breast-feeding How should I use this medicine? Take this medicine by mouth with a full glass of water. Follow the directions on the prescription label. It is best to take this medicine without food, but if it upsets your stomach take it with food. Take your medicine at regular intervals. Do not take your medicine more often than directed. Take all of your medicine as directed even if you think you are better. Do not skip doses or stop your medicine early. Talk to your pediatrician regarding the use of this medicine in children.  While this drug may be prescribed for selected conditions, precautions do apply. Overdosage: If you think you have taken too much of this medicine contact a poison control center or emergency room at once. NOTE: This medicine is only for you. Do not share this medicine with others. What if I miss a dose? If you miss a dose, take it as soon as you can. If it is almost time for your next dose, take only that dose. Do not take double or extra doses. What may interact with this medicine? -antacids -barbiturates -birth control pills -bismuth subsalicylate -carbamazepine -methoxyflurane -other antibiotics -phenytoin -vitamins that contain iron -warfarin This list may not describe all possible interactions. Give your health care provider a list of all the medicines, herbs, non-prescription drugs, or dietary supplements you use. Also tell them if you smoke, drink alcohol, or use illegal drugs. Some items may interact with your medicine. What should I watch for while using this medicine? Tell your doctor or health care professional if your symptoms do not improve. Do not treat diarrhea with over the counter products. Contact your doctor if you have diarrhea that lasts more than 2 days or if it is severe and watery. Do not take this medicine just before going to bed. It may not dissolve properly when you lay down and can cause pain in your throat. Drink plenty of fluids while taking this medicine to also help reduce irritation in your throat. This medicine can make you more sensitive to the sun. Keep out of the sun. If you cannot avoid being in the sun, wear protective clothing and use sunscreen. Do not use sun lamps or tanning beds/booths. Birth control  pills may not work properly while you are taking this medicine. Talk to your doctor about using an extra method of birth control. If you are being treated for a sexually transmitted infection, avoid sexual contact until you have finished your treatment.  Your sexual partner may also need treatment. Avoid antacids, aluminum, calcium, magnesium, and iron products for 4 hours before and 2 hours after taking a dose of this medicine. If you are using this medicine to prevent malaria, you should still protect yourself from contact with mosquitos. Stay in screened-in areas, use mosquito nets, keep your body covered, and use an insect repellent. What side effects may I notice from receiving this medicine? Side effects that you should report to your doctor or health care professional as soon as possible: -allergic reactions like skin rash, itching or hives, swelling of the face, lips, or tongue -difficulty breathing -fever -itching in the rectal or genital area -pain on swallowing -redness, blistering, peeling or loosening of the skin, including inside the mouth -severe stomach pain or cramps -unusual bleeding or bruising -unusually weak or tired -yellowing of the eyes or skin Side effects that usually do not require medical attention (report to your doctor or health care professional if they continue or are bothersome): -diarrhea -loss of appetite -nausea, vomiting This list may not describe all possible side effects. Call your doctor for medical advice about side effects. You may report side effects to FDA at 1-800-FDA-1088. Where should I keep my medicine? Keep out of the reach of children. Store at room temperature, below 30 degrees C (86 degrees F). Protect from light. Keep container tightly closed. Throw away any unused medicine after the expiration date. Taking this medicine after the expiration date can make you seriously ill. NOTE: This sheet is a summary. It may not cover all possible information. If you have questions about this medicine, talk to your doctor, pharmacist, or health care provider.  2018 Elsevier/Gold Standard (2015-08-18 17:11:22)

## 2017-01-25 ENCOUNTER — Encounter: Payer: Self-pay | Admitting: Family Medicine

## 2017-01-25 ENCOUNTER — Ambulatory Visit (INDEPENDENT_AMBULATORY_CARE_PROVIDER_SITE_OTHER): Payer: 59 | Admitting: Family Medicine

## 2017-01-25 ENCOUNTER — Ambulatory Visit: Payer: 59 | Admitting: Family Medicine

## 2017-01-25 VITALS — BP 118/78 | HR 63 | Temp 97.9°F | Ht 73.0 in | Wt 206.8 lb

## 2017-01-25 DIAGNOSIS — R1032 Left lower quadrant pain: Secondary | ICD-10-CM

## 2017-01-25 DIAGNOSIS — Z114 Encounter for screening for human immunodeficiency virus [HIV]: Secondary | ICD-10-CM | POA: Diagnosis not present

## 2017-01-25 DIAGNOSIS — Z7251 High risk heterosexual behavior: Secondary | ICD-10-CM | POA: Diagnosis not present

## 2017-01-25 DIAGNOSIS — Z1159 Encounter for screening for other viral diseases: Secondary | ICD-10-CM | POA: Diagnosis not present

## 2017-01-25 DIAGNOSIS — F410 Panic disorder [episodic paroxysmal anxiety] without agoraphobia: Secondary | ICD-10-CM | POA: Diagnosis not present

## 2017-01-25 DIAGNOSIS — J0100 Acute maxillary sinusitis, unspecified: Secondary | ICD-10-CM

## 2017-01-25 LAB — POC URINALSYSI DIPSTICK (AUTOMATED)
Bilirubin, UA: NEGATIVE
Blood, UA: NEGATIVE
Glucose, UA: NEGATIVE
Ketones, UA: NEGATIVE
Leukocytes, UA: NEGATIVE
Nitrite, UA: NEGATIVE
Protein, UA: NEGATIVE
Spec Grav, UA: >=1.03 — AB (ref 1.010–1.025)
Urobilinogen, UA: 0.2 E.U./dL
pH, UA: 6 (ref 5.0–8.0)

## 2017-01-25 LAB — CBC WITH DIFFERENTIAL/PLATELET
Basophils Absolute: 0.1 10*3/uL (ref 0.0–0.1)
Basophils Relative: 0.8 % (ref 0.0–3.0)
Eosinophils Absolute: 0.2 10*3/uL (ref 0.0–0.7)
Eosinophils Relative: 1.6 % (ref 0.0–5.0)
HCT: 45.4 % (ref 39.0–52.0)
Hemoglobin: 15.4 g/dL (ref 13.0–17.0)
Lymphocytes Relative: 21.8 % (ref 12.0–46.0)
Lymphs Abs: 2.2 10*3/uL (ref 0.7–4.0)
MCHC: 33.9 g/dL (ref 30.0–36.0)
MCV: 92.7 fl (ref 78.0–100.0)
Monocytes Absolute: 1.2 10*3/uL — ABNORMAL HIGH (ref 0.1–1.0)
Monocytes Relative: 11.5 % (ref 3.0–12.0)
Neutro Abs: 6.5 10*3/uL (ref 1.4–7.7)
Neutrophils Relative %: 64.3 % (ref 43.0–77.0)
Platelets: 221 10*3/uL (ref 150.0–400.0)
RBC: 4.9 Mil/uL (ref 4.22–5.81)
RDW: 13.7 % (ref 11.5–15.5)
WBC: 10.1 10*3/uL (ref 4.0–10.5)

## 2017-01-25 LAB — BASIC METABOLIC PANEL
BUN: 27 mg/dL — ABNORMAL HIGH (ref 6–23)
CO2: 25 mEq/L (ref 19–32)
Calcium: 9.3 mg/dL (ref 8.4–10.5)
Chloride: 104 mEq/L (ref 96–112)
Creatinine, Ser: 0.94 mg/dL (ref 0.40–1.50)
GFR: 86.45 mL/min (ref >60.00)
Glucose, Bld: 93 mg/dL (ref 70–99)
Potassium: 4.2 mEq/L (ref 3.5–5.1)
Sodium: 139 mEq/L (ref 135–145)

## 2017-01-25 MED ORDER — AMOXICILLIN-POT CLAVULANATE 875-125 MG PO TABS: 1.0000 | ORAL_TABLET | Freq: Two times a day (BID) | ORAL | 0 refills | Status: DC

## 2017-01-25 MED ORDER — LORAZEPAM 0.5 MG PO TABS: 0.5000 mg | ORAL_TABLET | Freq: Two times a day (BID) | ORAL | 2 refills | Status: DC | PRN

## 2017-01-25 NOTE — Assessment & Plan Note (Signed)
Patient running low on lorazepam- noted this prescribed by prior MD in records. I refilled as seems to provide substantial relief

## 2017-01-25 NOTE — Patient Instructions (Addendum)
Take miralax 1 capful a day for 1 week- hold for watery stools- ok if somewhat loose. Pain in abdomen could be constipation  If pain significantly worsens can change doxycycline to augmentin as also covers diverticulitis. Would also start this if have fever- I accidentally sent this to your pharmacy instead of printing  Please stop by lab before you go If white blood cells elevated could be sign of diverticulitis and id be more likely to have you change antibiotic  Health Maintenance Due  Topic Date Due  . Hepatitis C Screening  07-06-1956  . HIV Screening  02/15/1970  . COLONOSCOPY  02/15/2005

## 2017-01-25 NOTE — Progress Notes (Signed)
Subjective:  Terry Wright is a 61 y.o. year old very pleasant male patient who presents for/with See problem oriented charting ROS- LLQ pain, no dysuria or polyuria, does have firm BM daily, no fever or chills, sinus symptoms much improved   Past Medical History-  Patient Active Problem List   Diagnosis Date Noted  . CAD S/P percutaneous coronary angioplasty 09/21/2016    Priority: High  . Panic attacks 09/21/2016    Priority: High  . History of DVT (deep vein thrombosis)     Priority: High  . Former smoker     Priority: High  . Hyperglycemia 09/21/2016    Priority: Medium  . Dyslipidemia     Priority: Medium  . Essential hypertension     Priority: Medium  . Statin intolerance     Priority: Medium  . Mitral regurgitation     Priority: Low  . GERD (gastroesophageal reflux disease)     Priority: Low  . Palpitations     Priority: Low  . ST elevation myocardial infarction (STEMI) ([place]) 10/12/2016  . Ischemic cardiomyopathy 10/12/2016    Medications- reviewed and updated Current Outpatient Prescriptions  Medication Sig Dispense Refill  . aspirin 81 MG chewable tablet Chew 1 tablet (81 mg total) by mouth daily. 30 tablet 11  . carvedilol (COREG) 12.5 MG tablet Take 1 tablet ( total 12.5 mg) in the morning and take1.5 tablet  (total 18.75 mg) in the evening 75 tablet 6  . doxycycline (VIBRA-TABS) 100 MG tablet Take 1 tablet (100 mg total) by mouth 2 (two) times daily. 14 tablet 0  . LORazepam (ATIVAN) 0.5 MG tablet Take 1 tablet (0.5 mg total) by mouth 2 (two) times daily as needed for anxiety. 40 tablet 2  . oxymetazoline (AFRIN NASAL SPRAY) 0.05 % nasal spray Place 1 spray into both nostrils 2 (two) times daily. FOR 3-4 days only. 30 mL 0  . pantoprazole (PROTONIX) 40 MG tablet Take 1 tablet (40 mg total) by mouth daily. 30 tablet 11  . rosuvastatin (CRESTOR) 20 MG tablet Take 1 tablet (20 mg total) by mouth daily. 90 tablet 3  . ticagrelor (BRILINTA) 90 MG TABS  tablet Take 1 tablet (90 mg total) by mouth 2 (two) times daily. 60 tablet 11  . amoxicillin-clavulanate (AUGMENTIN) 875-125 MG tablet Take 1 tablet by mouth 2 (two) times daily. 14 tablet 0   No current facility-administered medications for this visit.     Objective: BP 118/78 (BP Location: Left Arm, Patient Position: Sitting, Cuff Size: Large)   Pulse 63   Temp 97.9 F (36.6 C) (Oral)   Ht 6\' 1"  (1.854 m)   Wt 206 lb 12.8 oz (93.8 kg)   SpO2 95%   BMI 27.28 kg/m  Gen: NAD, resting comfortably Mild erythema nasal turbinates but with significantly less edema than previously noted. TM normal, pharynx normal CV: RRR no murmurs rubs or gallops Lungs: CTAB no crackles, wheeze, rhonchi Abdomen: soft/nontender everywhere but LLQ- tender in LLQ but extends up toward midline on the left side/nondistended/normal bowel sounds. No rebound or guarding.  Ext: no edema Skin: warm, dry  Assessment/Plan:  LLQ abdominal pain  Sinusitis S: Patient was seen on Monday for several days of left sinus congestion and pain. Was placed on doxycycline as well as nasal decongestant (afrin) for bacterial sinusitis thought secondary to allergic rhinitis. With smoking history was advised close follow up and ENT if not improving. He states he has had significant improvement of symptoms.   Day  after he left started with LLQ pain. Comes and goes. Pain can get up to 6-7/10. Had 1 day where felt better and then last night started to bother him again. Having BM everyday - is on firm side. Pain seems to ease up with bowel movement. No diarrhea. No nausea or vomiting. No fever. Never had diverticulitis.   A/P: Sinusitis drastically improved then within 24 hours started with LLQ pain. Has firm daily BM and could just be constipation. Diverticulitis also on differential- see plan below. Could have switched to cipro/flagyl but that would not cover sinuses as well and augmentin seemed like reasonable choice. Pain level  concerning for diverticulitis but fact pain improves with BM and firm BMs point toward constipation.  Patient Instructions   Take miralax 1 capful a day for 1 week- hold for watery stools- ok if somewhat loose. Pain in abdomen could be constipation  If pain significantly worsens can change doxycycline to augmentin as also covers diverticulitis. Would also start this if have fever- I accidentally sent this to your pharmacy instead of printing  Please stop by lab before you go If white blood cells elevated could be sign of diverticulitis and id be more likely to have you change antibiotic  Health Maintenance Due  Topic Date Due  . Hepatitis C Screening  29-Aug-1955  . HIV Screening  02/15/1970  . COLONOSCOPY  02/15/2005    took lab as opportunity to update screening tests. Getting colonoscopy records.  Screening for HIV (human immunodeficiency virus) - Plan: HIV antibody Unprotected sex - Plan: HIV antibody Encounter for hepatitis C screening test for low risk patient - Plan: Hepatitis C antibody  Panic attacks Patient running low on lorazepam- noted this prescribed by prior MD in records. I refilled as seems to provide substantial relief  PRN follow up if not improving with above plan  Orders Placed This Encounter  Procedures  . CBC with Differential/Platelet  . Basic metabolic panel    [place]  . HIV antibody  . Hepatitis C antibody  . POCT Urinalysis Dipstick (Automated)    Meds ordered this encounter  Medications  . LORazepam (ATIVAN) 0.5 MG tablet    Sig: Take 1 tablet (0.5 mg total) by mouth 2 (two) times daily as needed for anxiety.    Dispense:  40 tablet    Refill:  2  . amoxicillin-clavulanate (AUGMENTIN) 875-125 MG tablet    Sig: Take 1 tablet by mouth 2 (two) times daily.    Dispense:  14 tablet    Refill:  0   Return precautions advised.  Garret Reddish, MD

## 2017-01-26 LAB — HIV ANTIBODY (ROUTINE TESTING W REFLEX): HIV 1&2 Ab, 4th Generation: NONREACTIVE

## 2017-01-26 LAB — HEPATITIS C ANTIBODY: HCV Ab: NEGATIVE

## 2017-01-29 ENCOUNTER — Ambulatory Visit: Payer: 59 | Admitting: Family Medicine

## 2017-02-08 DIAGNOSIS — M25561 Pain in right knee: Secondary | ICD-10-CM | POA: Diagnosis not present

## 2017-02-08 DIAGNOSIS — M1711 Unilateral primary osteoarthritis, right knee: Secondary | ICD-10-CM | POA: Diagnosis not present

## 2017-02-12 ENCOUNTER — Encounter: Payer: Self-pay | Admitting: Cardiology

## 2017-02-12 ENCOUNTER — Ambulatory Visit (INDEPENDENT_AMBULATORY_CARE_PROVIDER_SITE_OTHER): Payer: 59 | Admitting: Cardiology

## 2017-02-12 VITALS — BP 98/68 | HR 75 | Ht 73.0 in | Wt 209.4 lb

## 2017-02-12 DIAGNOSIS — F410 Panic disorder [episodic paroxysmal anxiety] without agoraphobia: Secondary | ICD-10-CM

## 2017-02-12 DIAGNOSIS — Z9861 Coronary angioplasty status: Secondary | ICD-10-CM | POA: Diagnosis not present

## 2017-02-12 DIAGNOSIS — I498 Other specified cardiac arrhythmias: Secondary | ICD-10-CM

## 2017-02-12 DIAGNOSIS — E785 Hyperlipidemia, unspecified: Secondary | ICD-10-CM

## 2017-02-12 DIAGNOSIS — I2510 Atherosclerotic heart disease of native coronary artery without angina pectoris: Secondary | ICD-10-CM

## 2017-02-12 DIAGNOSIS — Z79899 Other long term (current) drug therapy: Secondary | ICD-10-CM

## 2017-02-12 DIAGNOSIS — I340 Nonrheumatic mitral (valve) insufficiency: Secondary | ICD-10-CM

## 2017-02-12 DIAGNOSIS — I499 Cardiac arrhythmia, unspecified: Secondary | ICD-10-CM

## 2017-02-12 DIAGNOSIS — I255 Ischemic cardiomyopathy: Secondary | ICD-10-CM | POA: Diagnosis not present

## 2017-02-12 DIAGNOSIS — I10 Essential (primary) hypertension: Secondary | ICD-10-CM | POA: Diagnosis not present

## 2017-02-12 NOTE — Patient Instructions (Addendum)
NO MEDICATION CHANGES AT PRESENT TIME    [facility]--- WILL MAIL LABSLIP TO YOU DUE IN OCT 2018  LIPID CMP DO NOT EAT OR DRINK THE MORNING OF THE TEST. MAY COME BACK TO  THE OFFICE AND HAVE LABS DONE.-- HOURS OF OPERATION 8 AM -4 PM ,CLOSE  BETWEEN 1 PM--  2 PM    Your physician recommends that you schedule a follow-up appointment in [place] . -FIRST OF NOV. 2018 WITH DR HARDING--- FOLLOW UP AND PRE-OP EVALUATION FOR KNEE SURGERY.

## 2017-02-12 NOTE — Progress Notes (Signed)
PCP: Marin Olp, MD  Clinic Note: Chief Complaint  Patient presents with  . Follow-up    CAD - ds/p Inf MI - PCI Cx  . Shortness of Breath    some   . Chest Pain    pt states its not chest pain but feels like a bee sting    HPI: Terry Wright is a 61 y.o. male with a PMH below who presents today for 3 month follow-up for CAD-PCI. He has history of inferior-posterior STEMI,/VT arrest 08/06/2016. He was shocked twice for VT/VF, and cardiac catheter position revealed an occluded circumflex. With 2 DES stents. Initial EF by LV gram was 35-40% increase to 40 and 45% by echo.  Deovion Batrez Kellman was last seen On 10/11/2016  Recent Hospitalizations: None -- he has completed cardiac rehabilitation; he has had issues with somatic bigeminy during cardiac rehabilitation. We increase his Coreg up to a total of 12.5 mg twice a day. He did not tolerate 18.75 mg dose.  Studies Personally Reviewed - (if available, images/films reviewed: From Epic Chart or Care Everywhere)  2-D echo 01/11/2017: EF 50-55%. GR 1 DD. Mild to moderate LA dilation.  Converted to Crestor 20 mg daily because of leg pains on atorvastatin  Interval History: Terry Wright returns today overall doing quite well. He is back fully active. The main concern he has is that he has suffered an injury to his right knee and thinks he may have a meniscal tear. He just had a shot in the knee recently and has noted significant pain relief. He is waiting for MRI results show for that he has a meniscal tear. He also is dealing with left shoulder pain that is chronic from a work-related injury. From a cardiac standpoint all he notes is rare twinging sensations across his chest that lasted maybe a few seconds or 2. Nothing like his anginal symptom. No sensation of rapid irregular heartbeats or palpitations. No chest tightness or pressure with rest or exertion. Even with his knee in a brace now these have a shot, he is able to walk  daily without difficulty. He walks a couple miles a day and has not had recurrence of symptoms. No rapid irregular heartbeats palpitations. No syncope/near-syncope or TIAs last amaurosis fugax.   No claudication.  ROS: A comprehensive was performed. Review of Systems  Constitutional: Negative for malaise/fatigue.  HENT: Negative for nosebleeds.   Respiratory: Negative for cough, shortness of breath (He has short episodes usually when lying down of the sensation of difficulty catching his breath. He didn't really notice much relief with taking the Brilinta using a caffeinated soda.) and wheezing.   Cardiovascular:       Per history of present illness  Gastrointestinal: Negative for blood in stool and melena.  Genitourinary: Negative for hematuria.  Musculoskeletal: Positive for joint pain (Left shoulder, right knee) and myalgias (Notably improved since switching to Crestor). Negative for falls.  Psychiatric/Behavioral: Negative for depression. 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 He does have occasional panic/anxiety attacks.).   All other systems reviewed and are negative.   I have reviewed and (if needed) personally updated the patient's problem list, medications, allergies, past medical and surgical history, social and family history.   Past Medical History:  Diagnosis Date  . CAD S/P percutaneous coronary angioplasty 08/04/2016   100% occluded proximal circumflex: PCI with 4.0 x 16 mm Synergy DES. Also  noted 65% ostial OM 3 (relatively small caliber with planned medical management). EF 3545% with lateral hypokinesis.  Marland Kitchen DVT (deep venous thrombosis) (HCC)    Right upper arm DVT on 2 occasions in the remote past  . Dyslipidemia    Patient mentions triglycerides specifically  . Ejection fraction    LV function normal, echo, February, 2010  . GERD (gastroesophageal reflux disease)   . Hyperlipidemia     . Hypertension   . Mitral regurgitation    Mild, echo, February, 2010  . Palpitations    Mild in the past  . Statin intolerance    Felt poorly after Lipitor, Crestor, TriCor  . STEMI involving left circumflex coronary artery ([place]) 08/04/2016   Occluded very large caliber codominant circumflex - PCI with single DES  . Tobacco abuse     Past Surgical History:  Procedure Laterality Date  . CARDIAC CATHETERIZATION N/A 08/04/2016   Procedure: Left Heart Cath and Coronary Angiography;  Surgeon: Leonie Man, MD;  Location: [facility];  Service: Cardiovascular;  Laterality: N/A: 100% pCx --> PCI. residual 65% pOM3 (small).  ~EF 35-45%  with lateral HK.  Marland Kitchen CARDIAC CATHETERIZATION N/A 08/04/2016   Procedure: Coronary Stent Intervention;  Surgeon: Leonie Man, MD;  Location: [facility];  Service: Cardiovascular;  Laterality: N/A: pCx 100%-0%: Synergy DES 4.0 x 16 (4.5 mm)  . GANGLION CYST EXCISION     oct 2017  . HERNIA REPAIR     umbilical hernia 2017 oct  . TRANSTHORACIC ECHOCARDIOGRAM  08/05/2016   post STEMI: EF 40-45%. Mild concentric LVH. Severe HK of basal-mid inferolateral wall consistent with infarct in this distribution. GR 1 DD  . TRANSTHORACIC ECHOCARDIOGRAM  01/11/2017   EF 50-55%. GR 1 DD. Mild to moderate LA dilation.   Diagnostic Diagram                                                           Post-Intervention Diagram               Current Meds  Medication Sig  . aspirin 81 MG chewable tablet Chew 1 tablet (81 mg total) by mouth daily.  . carvedilol (COREG) 12.5 MG tablet Take 1 tablet ( total 12.5 mg) in the morning and take1.5 tablet  (total 18.75 mg) in the evening  . LORazepam (ATIVAN) 0.5 MG tablet Take 1 tablet (0.5 mg total) by mouth 2 (two) times daily as needed for anxiety.  . pantoprazole (PROTONIX) 40 MG tablet Take 1 tablet (40 mg total) by mouth daily.  . rosuvastatin (CRESTOR) 20 MG tablet Take 1 tablet (20 mg total) by mouth daily.   . ticagrelor (BRILINTA) 90 MG TABS tablet Take 1 tablet (90 mg total) by mouth 2 (two) times daily.    No Known Allergies  Social History   Social History  . Marital status: Married    Spouse name: N/A  . Number of children: N/A  . Years of education: N/A   Social History Main Topics  . Smoking status: Former Smoker    Packs/day: 1.00    Years: 40.00    Quit date: 2015  . Smokeless tobacco: Never Used  . Alcohol use 4.2 oz/week    7 Glasses of wine per week     Comment:  occasional  . Drug use: No  . Sexual activity: Not Asked   Other Topics Concern  . None   Social History Narrative   Married 10 years in 2018. 2 step kids- 62 and 33. 3 kids but he does not have contact with. No grandkids      Retired Theatre stage manager.       Hobbies: golf (needs shoulder replacement though, working in the yard, working out- was doing this regularly even before MI- walking/treadmill    family history includes Heart attack in his mother; Hypertension in his father; Other in his brother.  Wt Readings from Last 3 Encounters:  02/12/17 209 lb 6.4 oz (95 kg)  01/25/17 206 lb 12.8 oz (93.8 kg)  01/22/17 206 lb 9.6 oz (93.7 kg)    PHYSICAL EXAM BP 98/68   Pulse 75   Ht 6\' 1"  (1.854 m)   Wt 209 lb 6.4 oz (95 kg)   BMI 27.63 kg/m  General appearance: alert, cooperative, appears stated age, no distress. Heavy/muscular frame, nonobese. Healthy-appearing. Well-nourished well-groomed. HEENT: [place]/AT, EOMI, MMM, anicteric sclera Neck: no adenopathy, no carotid bruit and no JVD Lungs: clear to auscultation bilaterally, normal percussion bilaterally and non-labored Heart: RRR with ectopy. Normal S1 & S2. Non-displaced PMI. No M/R/G Abdomen: soft, non-tender; bowel sounds normal; no masses,  no organomegaly; no HJR Extremities: extremities normal, atraumatic, no cyanosis, or edema; right knee is in a brace. not Swollen. Pulses: 2+ and symmetric;  Neurologic: Mental status: Alert & oriented x 3,  thought content appropriate; non-focal exam.     Adult ECG Report  Rate: 85 ;  Rhythm: sinus bradycardia, premature ventricular contractions (PVC) and Ventricular trigeminy pattern.;   Narrative Interpretation: Otherwise normal axis, intervals and durations. Relatively stable EKG with the exception of PVCs.   Other studies Reviewed: Additional studies/ records that were reviewed today include:  Recent Labs:   Lab Results  Component Value Date   CHOL 101 11/03/2016   HDL 29 (L) 11/03/2016   LDLCALC 50 11/03/2016   LDLDIRECT 159.1 06/05/2011   TRIG 111 11/03/2016   CHOLHDL 3.5 11/03/2016   Lab Results  Component Value Date   CREATININE 0.94 01/25/2017   BUN 27 (H) 01/25/2017   NA 139 01/25/2017   K 4.2 01/25/2017   CL 104 01/25/2017   CO2 25 01/25/2017    ASSESSMENT / PLAN: Problem List Items Addressed This Visit    CAD S/P percutaneous coronary angioplasty - Primary (Chronic)    PCI of large codominant circumflex in setting of STEMI/progress. Doing very well with no recurrent anginal symptoms. No bleeding issues with Brilinta. If he does have bruising is okay to hold aspirin. He is on stable dose of beta blocker and statin.  Continue exercise and diet      Relevant Orders   EKG 12-Lead (Completed)   Dyslipidemia (Chronic)    Last labs showed significant improvement in cholesterol. His HDL however is low but LDL is now at goal. Continue Crestor. Continue exercise.      Relevant Orders   Lipid panel   Comprehensive metabolic panel   Essential hypertension (Chronic)    Blood pressures actually borderline low today. Since he is not asymptomatic and will make any adjustments. He still has bigeminy and I would like to continue the beta blocker. Insure he has adequate hydration      Ischemic cardiomyopathy (Chronic)    Initially reduced EF the setting of STEMI and cardiac arrest. Follow-up echocardiogram now shows low normal  function with no significant regional wall  motion abnormality. Essentially resolved cardiomyopathy with reperfusion.  No heart failure symptoms.      Relevant Orders   EKG 12-Lead (Completed)   Mitral regurgitation    Probably initially was ischemic MR. Now that the inferior wall seems to have recovered, he no longer had MR noted on echocardiogram.      Panic attacks    I do think he probably has some PTSD from his MI and cardiac arrest. I reassured him that this is a normal response based on his severe presentation.      Ventricular bigeminy (Chronic)    Relatively stable, and a dramatic on beta blocker. For now would continue to monitor as his echo has improved showing no signs of significant scar.       Other Visit Diagnoses    Medication management       Relevant Orders   Lipid panel   Comprehensive metabolic panel      Current medicines are reviewed at length with the patient today. (+/- concerns) n/a The following changes have been made: n/a  Patient Instructions  NO MEDICATION CHANGES AT PRESENT TIME    [facility]--- WILL MAIL LABSLIP TO YOU DUE IN OCT 2018  LIPID CMP DO NOT EAT OR DRINK THE MORNING OF THE TEST. MAY COME BACK TO  THE OFFICE AND HAVE LABS DONE.-- HOURS OF OPERATION 8 AM -4 PM ,CLOSE  BETWEEN 1 PM--  2 PM    Your physician recommends that you schedule a follow-up appointment in [place] . -FIRST OF NOV. 2018 WITH DR HARDING--- FOLLOW UP AND PRE-OP EVALUATION FOR KNEE SURGERY.    Studies Ordered:   Orders Placed This Encounter  Procedures  . Lipid panel  . Comprehensive metabolic panel  . EKG 12-Lead      [name], M.D., M.S. Interventional Cardiologist   Pager # [phone] Phone # [phone] [address] [place], [place] [address]

## 2017-02-14 ENCOUNTER — Encounter: Payer: Self-pay | Admitting: Cardiology

## 2017-02-14 DIAGNOSIS — M25562 Pain in left knee: Secondary | ICD-10-CM | POA: Diagnosis not present

## 2017-02-14 NOTE — Assessment & Plan Note (Signed)
PCI of large codominant circumflex in setting of STEMI/progress. Doing very well with no recurrent anginal symptoms. No bleeding issues with Brilinta. If he does have bruising is okay to hold aspirin. He is on stable dose of beta blocker and statin.  Continue exercise and diet

## 2017-02-14 NOTE — Assessment & Plan Note (Signed)
I do think he probably has some PTSD from his MI and cardiac arrest. I reassured him that this is a normal response based on his severe presentation.

## 2017-02-14 NOTE — Assessment & Plan Note (Signed)
Relatively stable, and a dramatic on beta blocker. For now would continue to monitor as his echo has improved showing no signs of significant scar.

## 2017-02-14 NOTE — Assessment & Plan Note (Signed)
Initially reduced EF the setting of STEMI and cardiac arrest. Follow-up echocardiogram now shows low normal function with no significant regional wall motion abnormality. Essentially resolved cardiomyopathy with reperfusion.  No heart failure symptoms.

## 2017-02-14 NOTE — Assessment & Plan Note (Signed)
Last labs showed significant improvement in cholesterol. His HDL however is low but LDL is now at goal. Continue Crestor. Continue exercise.

## 2017-02-14 NOTE — Assessment & Plan Note (Signed)
Probably initially was ischemic MR. Now that the inferior wall seems to have recovered, he no longer had MR noted on echocardiogram.

## 2017-02-14 NOTE — Assessment & Plan Note (Signed)
Blood pressures actually borderline low today. Since he is not asymptomatic and will make any adjustments. He still has bigeminy and I would like to continue the beta blocker. Insure he has adequate hydration

## 2017-02-16 DIAGNOSIS — S83241A Other tear of medial meniscus, current injury, right knee, initial encounter: Secondary | ICD-10-CM | POA: Diagnosis not present

## 2017-02-18 ENCOUNTER — Encounter: Payer: Self-pay | Admitting: Cardiology

## 2017-02-23 ENCOUNTER — Telehealth: Payer: Self-pay | Admitting: Cardiology

## 2017-02-23 NOTE — Telephone Encounter (Signed)
02/23/2017 Received office note from [place] for upcoming appointment with Dr. Ellyn Hack on 05/30/2017.  Records given to [facility]. cbr

## 2017-02-26 DIAGNOSIS — M19012 Primary osteoarthritis, left shoulder: Secondary | ICD-10-CM | POA: Diagnosis not present

## 2017-02-28 ENCOUNTER — Telehealth: Payer: Self-pay | Admitting: Cardiology

## 2017-02-28 NOTE — Telephone Encounter (Signed)
   [facility] Pre-operative Risk Assessment    DEFERRED TO DR HARDING:   Request for surgical clearance:  1. What type of surgery is being performed?RIGHT KNEE ARTHROSCOPY  2. When is this surgery scheduled? TBA  3. Are there any medications that need to be held prior to surgery and how long? BRILINTA 90 MG, ASPIRIN 81 MG  4. Name of physician performing surgery? DR Jenny Reichmann [facility]  5. What is your office phone and fax number? PHONE # [phone] ATTN Terry Wright ,Terry Wright--- FAX  [phone]  Terry Wright 02/28/2017, 2:39 PM  _________________________________________________________________   (provider comments below)

## 2017-02-28 NOTE — Telephone Encounter (Signed)
LEFT MESSAGE ON SECURE VOICEMAIL- received SURGICAL CLEARANCE REQUEST- FOR RIGHT KNEE ARTHROSCOPY.  AWAITING FOR DR HARDING , DETERMINATION ON SURGICAL CLEARANCE. WILL CONTACT OFFICE WITH ANSWER

## 2017-02-28 NOTE — Telephone Encounter (Signed)
I reviewed his cath films with interventional colleagues & checked the most recent literature recommendations.  He is > 6 months out from his MI-PCI with a ~4th generation DES stent (European data indicates that it is safe to suspend antiplatelet agents such as Brilinta after  3-6 months).  It would be preferable to continue ASA, but given the large diameter of his stent, should be safe holding ASA as well.  OK to hold Brilinta 5 days pre-op & ASA 3-5 days preop if necessary.  Low Risk patient for Low CV Risk Surgery.  [name], MD  We should also let the patient know.  His need for surgery was up in the air @ the time of his last visit.

## 2017-02-28 NOTE — Telephone Encounter (Signed)
Elmyra Ricks with [facility] calling to check on status of surg clearance request-pls call  [phone]

## 2017-03-01 NOTE — Telephone Encounter (Signed)
LEFT MESSAGE ON SECURE VOICEMAIL WITH [facility]- ROUTED INFORMATION.    SPOKE WITH PATIENT INFORMED PATIENT OF INFORMATION AND CLEARANCE FOR SURGERY VOICED UNDERSTANDING.

## 2017-03-16 ENCOUNTER — Other Ambulatory Visit: Payer: Self-pay | Admitting: Orthopedic Surgery

## 2017-03-16 NOTE — Progress Notes (Signed)
Called and left lessage with nicole corbin scheduler for dr graves that [facility] cardiology dr harding says follow up late oct early nov to be cleared for knee surgery.

## 2017-03-22 ENCOUNTER — Ambulatory Visit: Payer: Commercial Managed Care - HMO | Admitting: Family Medicine

## 2017-03-22 ENCOUNTER — Telehealth: Payer: Self-pay | Admitting: Cardiology

## 2017-03-22 NOTE — Telephone Encounter (Signed)
Spoke with pt, he is wanting to walk on the treadmill, he wears a brace for his knee. Okay given for patient to continue.

## 2017-03-22 NOTE — Telephone Encounter (Signed)
New message    The 5 days the patient is off Brillinta , is he still allowed to exercise and do physical activity , or should he refrain.

## 2017-03-22 NOTE — Progress Notes (Signed)
02-28-17 (EPIC) Cardiac clearance from Dr. Ellyn Hack, Cardiologist  02-12-17 [facility]) EKG   01-11-17 (EPIC) ECHO EF 50-55%, no stenosis, no regurgitation

## 2017-03-22 NOTE — Patient Instructions (Addendum)
Terry Wright  03/22/2017   Your procedure is scheduled on: 04-04-17   Report to [facility] Main  Entrance Take Terry Wright to 3rd floor to  Terry Wright at 6:30 AM.   Call this number if you have problems the morning of surgery [phone]   Remember: ONLY 1 PERSON MAY GO WITH YOU TO SHORT STAY TO GET  READY MORNING OF Terry Wright.  Do not eat food or drink liquids :After Midnight.     Take these medicines the morning of surgery with A SIP OF WATER: Pt prefers not to take any medication the day of surgery.                               You may not have any metal on your body including hair pins and              piercings  Do not wear jewelry, lotions, powders or perfumes, deodorant              Men may shave face and neck.   Do not bring valuables to the hospital. [name].  Contacts, dentures or bridgework may not be worn into surgery.     Patients discharged the day of surgery will not be allowed to drive home.  Name and phone number of your driver: Terry Wright [phone]               Please read over the following fact sheets you were given: _____________________________________________________________________             [facility] - Preparing for Surgery Before surgery, you can play an important role.  Because skin is not sterile, your skin needs to be as free of germs as possible.  You can reduce the number of germs on your skin by washing with CHG (chlorahexidine gluconate) soap before surgery.  CHG is an antiseptic cleaner which kills germs and bonds with the skin to continue killing germs even after washing. Please DO NOT use if you have an allergy to CHG or antibacterial soaps.  If your skin becomes reddened/irritated stop using the CHG and inform your nurse when you arrive at Short Stay. Do not shave (including legs and underarms) for at least 48 hours prior to the first CHG  shower.  You may shave your face/neck. Please follow these instructions carefully:  1.  Shower with CHG Soap the night before surgery and the  morning of Surgery.  2.  If you choose to wash your hair, wash your hair first as usual with your  normal  shampoo.  3.  After you shampoo, rinse your hair and body thoroughly to remove the  shampoo.                           4.  Use CHG as you would any other liquid soap.  You can apply chg directly  to the skin and wash                       Gently with a scrungie or clean washcloth.  5.  Apply the CHG Soap to your body ONLY FROM THE NECK DOWN.  Do not use on face/ open                           Wound or open sores. Avoid contact with eyes, ears mouth and genitals (private parts).                       Wash face,  Genitals (private parts) with your normal soap.             6.  Wash thoroughly, paying special attention to the area where your surgery  will be performed.  7.  Thoroughly rinse your body with warm water from the neck down.  8.  DO NOT shower/wash with your normal soap after using and rinsing off  the CHG Soap.                9.  Pat yourself dry with a clean towel.            10.  Wear clean pajamas.            11.  Place clean sheets on your bed the night of your first shower and do not  sleep with pets. Day of Surgery : Do not apply any lotions/deodorants the morning of surgery.  Please wear clean clothes to the hospital/surgery center.  FAILURE TO FOLLOW THESE INSTRUCTIONS MAY RESULT IN THE CANCELLATION OF YOUR SURGERY PATIENT SIGNATURE_________________________________  NURSE SIGNATURE__________________________________  ________________________________________________________________________   Terry Wright  An incentive spirometer is a tool that can help keep your lungs clear and active. This tool measures how well you are filling your lungs with each breath. Taking long deep breaths may help reverse or decrease the chance  of developing breathing (pulmonary) problems (especially infection) following:  A long period of time when you are unable to move or be active. BEFORE THE PROCEDURE   If the spirometer includes an indicator to show your best effort, your nurse or respiratory therapist will set it to a desired goal.  If possible, sit up straight or lean slightly forward. Try not to slouch.  Hold the incentive spirometer in an upright position. INSTRUCTIONS FOR USE  1. Sit on the edge of your bed if possible, or sit up as far as you can in bed or on a chair. 2. Hold the incentive spirometer in an upright position. 3. Breathe out normally. 4. Place the mouthpiece in your mouth and seal your lips tightly around it. 5. Breathe in slowly and as deeply as possible, raising the piston or the ball toward the top of the column. 6. Hold your breath for 3-5 seconds or for as long as possible. Allow the piston or ball to fall to the bottom of the column. 7. Remove the mouthpiece from your mouth and breathe out normally. 8. Rest for a few seconds and repeat Steps 1 through 7 at least 10 times every 1-2 hours when you are awake. Take your time and take a few normal breaths between deep breaths. 9. The spirometer may include an indicator to show your best effort. Use the indicator as a goal to work toward during each repetition. 10. After each set of 10 deep breaths, practice coughing to be sure your lungs are clear. If you have an incision (the cut made at the time of surgery), support your incision when coughing by placing a pillow or rolled up towels firmly against it. Once you are able to get out of  bed, walk around indoors and cough well. You may stop using the incentive spirometer when instructed by your caregiver.  RISKS AND COMPLICATIONS  Take your time so you do not get dizzy or light-headed.  If you are in pain, you may need to take or ask for pain medication before doing incentive spirometry. It is harder to  take a deep breath if you are having pain. AFTER USE  Rest and breathe slowly and easily.  It can be helpful to keep track of a log of your progress. Your caregiver can provide you with a simple table to help with this. If you are using the spirometer at home, follow these instructions: [place] IF:   You are having difficultly using the spirometer.  You have trouble using the spirometer as often as instructed.  Your pain medication is not giving enough relief while using the spirometer.  You develop fever of 100.5 F (38.1 C) or higher. SEEK IMMEDIATE MEDICAL CARE IF:   You cough up bloody sputum that had not been present before.  You develop fever of 102 F (38.9 C) or greater.  You develop worsening pain at or near the incision site. MAKE SURE YOU:   Understand these instructions.  Will watch your condition.  Will get help right away if you are not doing well or get worse. Document Released: 11/27/2006 Document Revised: 10/09/2011 Document Reviewed: 01/28/2007 [facility] Patient Information 2014 [place], [place].   ________________________________________________________________________

## 2017-03-23 ENCOUNTER — Other Ambulatory Visit (HOSPITAL_COMMUNITY): Payer: 59

## 2017-03-23 ENCOUNTER — Encounter (HOSPITAL_COMMUNITY)
Admission: RE | Admit: 2017-03-23 | Discharge: 2017-03-23 | Disposition: A | Discharge status: Home / Self Care | Payer: 59 | Source: Ambulatory Visit | Attending: Orthopedic Surgery | Admitting: Orthopedic Surgery

## 2017-03-23 ENCOUNTER — Encounter (HOSPITAL_COMMUNITY): Payer: Self-pay

## 2017-03-23 DIAGNOSIS — S83241A Other tear of medial meniscus, current injury, right knee, initial encounter: Secondary | ICD-10-CM | POA: Diagnosis not present

## 2017-03-23 DIAGNOSIS — Z01818 Encounter for other preprocedural examination: Secondary | ICD-10-CM | POA: Insufficient documentation

## 2017-03-23 DIAGNOSIS — X58XXXA Exposure to other specified factors, initial encounter: Secondary | ICD-10-CM | POA: Insufficient documentation

## 2017-03-23 LAB — CBC
HCT: 43.7 % (ref 39.0–52.0)
Hemoglobin: 15 g/dL (ref 13.0–17.0)
MCH: 31.3 pg (ref 26.0–34.0)
MCHC: 34.3 g/dL (ref 30.0–36.0)
MCV: 91 fL (ref 78.0–100.0)
Platelets: 169 10*3/uL (ref 150–400)
RBC: 4.8 MIL/uL (ref 4.22–5.81)
RDW: 13.5 % (ref 11.5–15.5)
WBC: 7.5 10*3/uL (ref 4.0–10.5)

## 2017-03-23 LAB — BASIC METABOLIC PANEL
Anion gap: 6 (ref 5–15)
BUN: 22 mg/dL — ABNORMAL HIGH (ref 6–20)
CO2: 27 mmol/L (ref 22–32)
Calcium: 9 mg/dL (ref 8.9–10.3)
Chloride: 106 mmol/L (ref 101–111)
Creatinine, Ser: 0.99 mg/dL (ref 0.61–1.24)
GFR calc Af Amer: >60 mL/min (ref >60)
GFR calc non Af Amer: >60 mL/min (ref >60)
Glucose, Bld: 128 mg/dL — ABNORMAL HIGH (ref 65–99)
Potassium: 4.7 mmol/L (ref 3.5–5.1)
Sodium: 139 mmol/L (ref 135–145)

## 2017-04-01 ENCOUNTER — Other Ambulatory Visit: Payer: Self-pay | Admitting: Cardiology

## 2017-04-03 NOTE — Telephone Encounter (Signed)
REFILL 

## 2017-04-04 ENCOUNTER — Ambulatory Visit (HOSPITAL_COMMUNITY)
Admission: RE | Admit: 2017-04-04 | Discharge: 2017-04-04 | Disposition: A | Discharge status: Home / Self Care | Payer: 59 | Source: Ambulatory Visit | Attending: Orthopedic Surgery | Admitting: Orthopedic Surgery

## 2017-04-04 ENCOUNTER — Ambulatory Visit (HOSPITAL_COMMUNITY): Payer: 59 | Admitting: Certified Registered"

## 2017-04-04 ENCOUNTER — Encounter (HOSPITAL_COMMUNITY): Payer: Self-pay | Admitting: Certified Registered"

## 2017-04-04 ENCOUNTER — Encounter (HOSPITAL_COMMUNITY)
Admission: RE | Disposition: A | Discharge status: Home / Self Care | Payer: Self-pay | Source: Ambulatory Visit | Attending: Orthopedic Surgery

## 2017-04-04 DIAGNOSIS — Z888 Allergy status to other drugs, medicaments and biological substances status: Secondary | ICD-10-CM | POA: Insufficient documentation

## 2017-04-04 DIAGNOSIS — E785 Hyperlipidemia, unspecified: Secondary | ICD-10-CM | POA: Diagnosis not present

## 2017-04-04 DIAGNOSIS — I10 Essential (primary) hypertension: Secondary | ICD-10-CM | POA: Insufficient documentation

## 2017-04-04 DIAGNOSIS — Z8249 Family history of ischemic heart disease and other diseases of the circulatory system: Secondary | ICD-10-CM | POA: Insufficient documentation

## 2017-04-04 DIAGNOSIS — Z86718 Personal history of other venous thrombosis and embolism: Secondary | ICD-10-CM | POA: Insufficient documentation

## 2017-04-04 DIAGNOSIS — I252 Old myocardial infarction: Secondary | ICD-10-CM | POA: Diagnosis not present

## 2017-04-04 DIAGNOSIS — Z7982 Long term (current) use of aspirin: Secondary | ICD-10-CM | POA: Insufficient documentation

## 2017-04-04 DIAGNOSIS — Z87891 Personal history of nicotine dependence: Secondary | ICD-10-CM | POA: Insufficient documentation

## 2017-04-04 DIAGNOSIS — M23231 Derangement of other medial meniscus due to old tear or injury, right knee: Secondary | ICD-10-CM | POA: Diagnosis not present

## 2017-04-04 DIAGNOSIS — K219 Gastro-esophageal reflux disease without esophagitis: Secondary | ICD-10-CM | POA: Insufficient documentation

## 2017-04-04 DIAGNOSIS — I340 Nonrheumatic mitral (valve) insufficiency: Secondary | ICD-10-CM | POA: Diagnosis not present

## 2017-04-04 DIAGNOSIS — S83241A Other tear of medial meniscus, current injury, right knee, initial encounter: Secondary | ICD-10-CM

## 2017-04-04 DIAGNOSIS — Z955 Presence of coronary angioplasty implant and graft: Secondary | ICD-10-CM | POA: Diagnosis not present

## 2017-04-04 DIAGNOSIS — M6751 Plica syndrome, right knee: Secondary | ICD-10-CM | POA: Insufficient documentation

## 2017-04-04 DIAGNOSIS — I2582 Chronic total occlusion of coronary artery: Secondary | ICD-10-CM | POA: Insufficient documentation

## 2017-04-04 DIAGNOSIS — M94261 Chondromalacia, right knee: Secondary | ICD-10-CM | POA: Diagnosis not present

## 2017-04-04 DIAGNOSIS — S83231A Complex tear of medial meniscus, current injury, right knee, initial encounter: Secondary | ICD-10-CM | POA: Diagnosis not present

## 2017-04-04 DIAGNOSIS — I2510 Atherosclerotic heart disease of native coronary artery without angina pectoris: Secondary | ICD-10-CM | POA: Insufficient documentation

## 2017-04-04 DIAGNOSIS — Z79899 Other long term (current) drug therapy: Secondary | ICD-10-CM | POA: Insufficient documentation

## 2017-04-04 HISTORY — PX: CHONDROPLASTY: SHX5177

## 2017-04-04 HISTORY — PX: KNEE ARTHROSCOPY: SHX127

## 2017-04-04 LAB — PROTIME-INR
INR: 1.08
Prothrombin Time: 13.9 seconds (ref 11.4–15.2)

## 2017-04-04 SURGERY — ARTHROSCOPY, KNEE
Anesthesia: General | Site: Knee | Laterality: Right

## 2017-04-04 MED ORDER — EPINEPHRINE PF 1 MG/ML IJ SOLN
INTRAMUSCULAR | Status: AC
  Filled 2017-04-04: qty 2

## 2017-04-04 MED ORDER — PHENYLEPHRINE 40 MCG/ML (10ML) SYRINGE FOR IV PUSH (FOR BLOOD PRESSURE SUPPORT)
PREFILLED_SYRINGE | INTRAVENOUS | Status: DC | PRN
  Administered 2017-04-04 (×3): 80 ug via INTRAVENOUS

## 2017-04-04 MED ORDER — DEXAMETHASONE SODIUM PHOSPHATE 10 MG/ML IJ SOLN
INTRAMUSCULAR | Status: DC | PRN
  Administered 2017-04-04: 10 mg via INTRAVENOUS

## 2017-04-04 MED ORDER — MIDAZOLAM HCL 5 MG/5ML IJ SOLN
INTRAMUSCULAR | Status: DC | PRN
  Administered 2017-04-04: 2 mg via INTRAVENOUS

## 2017-04-04 MED ORDER — TICAGRELOR 90 MG PO TABS: 90.0000 mg | ORAL_TABLET | Freq: Two times a day (BID) | ORAL | 11 refills | Status: DC

## 2017-04-04 MED ORDER — LIDOCAINE 2% (20 MG/ML) 5 ML SYRINGE
INTRAMUSCULAR | Status: AC
  Filled 2017-04-04: qty 5

## 2017-04-04 MED ORDER — LIDOCAINE 2% (20 MG/ML) 5 ML SYRINGE
INTRAMUSCULAR | Status: DC | PRN
  Administered 2017-04-04: 100 mg via INTRAVENOUS

## 2017-04-04 MED ORDER — PROPOFOL 10 MG/ML IV BOLUS
INTRAVENOUS | Status: AC
  Filled 2017-04-04: qty 20

## 2017-04-04 MED ORDER — BUPIVACAINE HCL (PF) 0.5 % IJ SOLN
INTRAMUSCULAR | Status: AC
  Filled 2017-04-04: qty 30

## 2017-04-04 MED ORDER — PROPOFOL 10 MG/ML IV BOLUS
INTRAVENOUS | Status: DC | PRN
  Administered 2017-04-04: 170 mg via INTRAVENOUS

## 2017-04-04 MED ORDER — LACTATED RINGERS IV SOLN
INTRAVENOUS | Status: DC | PRN
  Administered 2017-04-04: 08:00:00 via INTRAVENOUS

## 2017-04-04 MED ORDER — FENTANYL CITRATE (PF) 100 MCG/2ML IJ SOLN
INTRAMUSCULAR | Status: AC
  Filled 2017-04-04: qty 2

## 2017-04-04 MED ORDER — PROMETHAZINE HCL 25 MG/ML IJ SOLN: 6.2500 mg | INTRAMUSCULAR | Status: DC | PRN

## 2017-04-04 MED ORDER — FENTANYL CITRATE (PF) 100 MCG/2ML IJ SOLN: 25.0000 ug | INTRAMUSCULAR | Status: DC | PRN

## 2017-04-04 MED ORDER — ONDANSETRON HCL 4 MG/2ML IJ SOLN
INTRAMUSCULAR | Status: AC
  Filled 2017-04-04: qty 2

## 2017-04-04 MED ORDER — HYDROCODONE-ACETAMINOPHEN 5-325 MG PO TABS
1.0000 | ORAL_TABLET | Freq: Four times a day (QID) | ORAL | Status: DC | PRN
  Administered 2017-04-04: 1 via ORAL
  Filled 2017-04-04: qty 1

## 2017-04-04 MED ORDER — CEFAZOLIN SODIUM-DEXTROSE 2-4 GM/100ML-% IV SOLN
2.0000 g | INTRAVENOUS | Status: AC
  Administered 2017-04-04: 2 g via INTRAVENOUS

## 2017-04-04 MED ORDER — ONDANSETRON HCL 4 MG/2ML IJ SOLN
INTRAMUSCULAR | Status: DC | PRN
  Administered 2017-04-04: 4 mg via INTRAVENOUS

## 2017-04-04 MED ORDER — CEFAZOLIN SODIUM-DEXTROSE 2-4 GM/100ML-% IV SOLN
INTRAVENOUS | Status: AC
  Filled 2017-04-04: qty 100

## 2017-04-04 MED ORDER — HYDROCODONE-ACETAMINOPHEN 5-325 MG PO TABS: 1.0000 | ORAL_TABLET | Freq: Four times a day (QID) | ORAL | 0 refills | Status: DC | PRN

## 2017-04-04 MED ORDER — CHLORHEXIDINE GLUCONATE 4 % EX LIQD: 60.0000 mL | Freq: Once | CUTANEOUS | Status: DC

## 2017-04-04 MED ORDER — PHENYLEPHRINE 40 MCG/ML (10ML) SYRINGE FOR IV PUSH (FOR BLOOD PRESSURE SUPPORT)
PREFILLED_SYRINGE | INTRAVENOUS | Status: AC
  Filled 2017-04-04: qty 10

## 2017-04-04 MED ORDER — KETOROLAC TROMETHAMINE 30 MG/ML IJ SOLN: 30.0000 mg | Freq: Once | INTRAMUSCULAR | Status: DC | PRN

## 2017-04-04 MED ORDER — LACTATED RINGERS IR SOLN
Status: AC | PRN
  Administered 2017-04-04: 6000 mL/h

## 2017-04-04 MED ORDER — DEXAMETHASONE SODIUM PHOSPHATE 10 MG/ML IJ SOLN
INTRAMUSCULAR | Status: AC
  Filled 2017-04-04: qty 1

## 2017-04-04 MED ORDER — FENTANYL CITRATE (PF) 100 MCG/2ML IJ SOLN
INTRAMUSCULAR | Status: DC | PRN
  Administered 2017-04-04: 100 ug via INTRAVENOUS

## 2017-04-04 MED ORDER — BUPIVACAINE HCL (PF) 0.5 % IJ SOLN
INTRAMUSCULAR | Status: DC | PRN
  Administered 2017-04-04: 20 mL

## 2017-04-04 MED ORDER — EPINEPHRINE PF 1 MG/ML IJ SOLN
INTRAMUSCULAR | Status: DC | PRN
  Administered 2017-04-04: 2 mg

## 2017-04-04 MED ORDER — MIDAZOLAM HCL 2 MG/2ML IJ SOLN
INTRAMUSCULAR | Status: AC
  Filled 2017-04-04: qty 2

## 2017-04-04 SURGICAL SUPPLY — 27 items
BANDAGE ACE 6X5 VEL STRL LF (GAUZE/BANDAGES/DRESSINGS) ×3 IMPLANT
BLADE 4.2CUDA (BLADE) ×3 IMPLANT
BLADE CUDA 5.5 (BLADE) IMPLANT
BLADE CUTTER GATOR 3.5 (BLADE) IMPLANT
BLADE GREAT WHITE 4.2 (BLADE) IMPLANT
BLADE SURG SZ11 CARB STEEL (BLADE) ×1 IMPLANT
BNDG GAUZE ELAST 4 BULKY (GAUZE/BANDAGES/DRESSINGS) ×3 IMPLANT
BOOTIES KNEE HIGH SLOAN (MISCELLANEOUS) ×3 IMPLANT
DRAPE SHEET LG 3/4 BI-LAMINATE (DRAPES) ×3 IMPLANT
DRSG EMULSION OIL 3X3 NADH (GAUZE/BANDAGES/DRESSINGS) ×3 IMPLANT
DRSG PAD ABDOMINAL 8X10 ST (GAUZE/BANDAGES/DRESSINGS) ×3 IMPLANT
DURAPREP 26ML APPLICATOR (WOUND CARE) ×3 IMPLANT
GAUZE SPONGE 4X4 12PLY STRL (GAUZE/BANDAGES/DRESSINGS) ×3 IMPLANT
GLOVE ECLIPSE 7.5 STRL STRAW (GLOVE) ×6 IMPLANT
IV LACTATED RINGER IRRG 3000ML (IV SOLUTION) ×6
IV LR IRRIG 3000ML ARTHROMATIC (IV SOLUTION) ×10 IMPLANT
KIT BASIN OR (CUSTOM PROCEDURE TRAY) ×1 IMPLANT
MANIFOLD NEPTUNE II (INSTRUMENTS) ×5 IMPLANT
PACK ARTHROSCOPY WL (CUSTOM PROCEDURE TRAY) ×3 IMPLANT
PACK ICE MAXI GEL EZY WRAP (MISCELLANEOUS) ×7 IMPLANT
PAD ABD 8X10 STRL (GAUZE/BANDAGES/DRESSINGS) ×1 IMPLANT
PAD MASON LEG HOLDER (PIN) ×1 IMPLANT
PADDING CAST COTTON 6X4 STRL (CAST SUPPLIES) ×3 IMPLANT
POSITIONER SURGICAL ARM (MISCELLANEOUS) ×4 IMPLANT
SUT ETHILON 4 0 PS 2 18 (SUTURE) ×3 IMPLANT
TUBING ARTHRO INFLOW-ONLY STRL (TUBING) ×3 IMPLANT
WRAP KNEE MAXI GEL POST OP (GAUZE/BANDAGES/DRESSINGS) ×3 IMPLANT

## 2017-04-04 NOTE — Discharge Instructions (Signed)
General Anesthesia, Adult, Care After These instructions provide you with information about caring for yourself after your procedure. Your health care provider may also give you more specific instructions. Your treatment has been planned according to current medical practices, but problems sometimes occur. Call your health care provider if you have any problems or questions after your procedure. What can I expect after the procedure? After the procedure, it is common to have:  Vomiting.  A sore throat.  Mental slowness.  It is common to feel:  Nauseous.  Cold or shivery.  Sleepy.  Tired.  Sore or achy, even in parts of your body where you did not have surgery.  Follow these instructions at home: For at least 24 hours after the procedure:  Do not: ? Participate in activities where you could fall or become injured. ? Drive. ? Use heavy machinery. ? Drink alcohol. ? Take sleeping pills or medicines that cause drowsiness. ? Make important decisions or sign legal documents. ? Take care of children on your own.  Rest. Eating and drinking  If you vomit, drink water, juice, or soup when you can drink without vomiting.  Drink enough fluid to keep your urine clear or pale yellow.  Make sure you have little or no nausea before eating solid foods.  Follow the diet recommended by your health care provider. General instructions  Have a responsible adult stay with you until you are awake and alert.  Return to your normal activities as told by your health care provider. Ask your health care provider what activities are safe for you.  Take over-the-counter and prescription medicines only as told by your health care provider.  If you smoke, do not smoke without supervision.  Keep all follow-up visits as told by your health care provider. This is important. Contact a health care provider if:  You continue to have nausea or vomiting at home, and medicines are not helpful.  You  cannot drink fluids or start eating again.  You cannot urinate after 8-12 hours.  You develop a skin rash.  You have fever.  You have increasing redness at the site of your procedure. Get help right away if:  You have difficulty breathing.  You have chest pain.  You have unexpected bleeding.  You feel that you are having a life-threatening or urgent problem. This information is not intended to replace advice given to you by your health care provider. Make sure you discuss any questions you have with your health care provider. Document Released: 10/23/2000 Document Revised: 12/20/2015 Document Reviewed: 07/01/2015 Elsevier Interactive Patient Education  2018 [employer] POST-OP KNEE ARTHROSCOPY INSTRUCTIONS  Dr. Alain Marion PA-C  Pain You will be expected to have a moderate amount of pain in the affected knee for approximately two weeks. However, the first two days will be the most severe pain. A prescription has been provided to take as needed for the pain. The pain can be reduced by applying ice packs to the knee for the first 1-2 weeks post surgery. Also, keeping the leg elevated on pillows will help alleviate the pain. If you develop any acute pain or swelling in your calf muscle, please call the doctor.  Activity It is preferred that you stay at bed rest for approximately 24 hours. However, you may go to the bathroom with help. Weight bearing as tolerated. You may begin the knee exercises the day of surgery. Discontinue crutches as the knee pain resolves.  Dressing Keep the dressing dry. If the  ace bandage should wrinkle or roll up, this can be rewrapped to prevent ridges in the bandage. You may remove all dressings in 48 hours,  apply bandaids to each wound. You may shower on the 4th day after surgery but no tub bath.  Symptoms to report to your doctor Extreme pain Extreme swelling Temperature above 101 degrees Change in the feeling, color, or movement of your  toes Redness, heat, or swelling at your incision  Exercise If is preferred that as soon as possible you try to do a straight leg raise without bending the knee and concentrate on bringing the heel of your foot off the bed up to approximately 45 degrees and hold for the count of 10 seconds. Repeat this at least 10 times three or four times per day. Additional exercises are provided below.  You are encouraged to bend the knee as tolerated.  Follow-Up Call to schedule a follow-up appointment in 5-7 days. Our office # is [phone].  POST-OP EXERCISES  Short Arc Quads  1. Lie on back with legs straight. Place towel roll under thigh, just above knee. 2. Tighten thigh muscles to straighten knee and lift heel off bed. 3. Hold for slow count of five, then lower. 4. Do three sets of ten    Straight Leg Raises  1. Lie on back with operative leg straight and other leg bent. 2. Keeping operative leg completely straight, slowly lift operative leg so foot is 5 inches off bed. 3. Hold for slow count of five, then lower. 4. Do three sets of ten.    DO BOTH EXERCISES 2 TIMES A DAY  Ankle Pumps  Work/move the operative ankle and foot up and down 10 times every hour while awake.

## 2017-04-04 NOTE — Anesthesia Postprocedure Evaluation (Signed)
Anesthesia Post Note  Patient: Terry Wright  Procedure(s) Performed: Procedure(s) (LRB): ARTHROSCOPY RIGHT KNEE, WITH MEIDAL FEMORAL CONDYLE PATELLAR MEDIAL FEMORAL JOINT PLICA EXCISION  (Right) CHONDROPLASTY (Right)     Patient location during evaluation: PACU Anesthesia Type: General Level of consciousness: awake and alert Pain management: pain level controlled Vital Signs Assessment: post-procedure vital signs reviewed and stable Respiratory status: spontaneous breathing, nonlabored ventilation, respiratory function stable and patient connected to nasal cannula oxygen Cardiovascular status: blood pressure returned to baseline and stable Postop Assessment: no signs of nausea or vomiting Anesthetic complications: no    Last Vitals:  Vitals:   04/04/17 0945 04/04/17 1000  BP: 110/72 107/69  Pulse: (!) 58 (!) 55  Resp: 13 11  Temp:  36.6 C  SpO2: 95% 96%    Last Pain:  Vitals:   04/04/17 1000  TempSrc:   PainSc: 0-No pain                 Amilya Haver S

## 2017-04-04 NOTE — Transfer of Care (Signed)
Immediate Anesthesia Transfer of Care Note  Patient: Terry Wright  Procedure(s) Performed: Procedure(s): ARTHROSCOPY RIGHT KNEE (Right) CHONDROPLASTY (Right)  Patient Location: PACU  Anesthesia Type:General  Level of Consciousness: awake, alert  and oriented  Airway & Oxygen Therapy: Patient Spontanous Breathing and Patient connected to face mask oxygen  Post-op Assessment: Report given to RN and Post -op Vital signs reviewed and stable  Post vital signs: Reviewed and stable  Last Vitals:  Vitals:   04/04/17 0635  BP: 120/85  Pulse: 63  Resp: 16  Temp: (!) 36.2 C  SpO2: 97%    Last Pain:  Vitals:   04/04/17 0650  TempSrc:   PainSc: 0-No pain      Patients Stated Pain Goal: 3 (38/18/40 3754)  Complications: No apparent anesthesia complications

## 2017-04-04 NOTE — Anesthesia Preprocedure Evaluation (Signed)
Anesthesia Evaluation  Patient identified by MRN, date of birth, ID band Patient awake    Reviewed: Allergy & Precautions, NPO status , Patient's Chart, lab work & pertinent test results  Airway Mallampati: II  TM Distance: >3 FB Neck ROM: Full    Dental no notable dental hx.    Pulmonary neg pulmonary ROS, former smoker,    Pulmonary exam normal breath sounds clear to auscultation       Cardiovascular hypertension, + CAD, + Past MI, + Cardiac Stents and + DVT  Normal cardiovascular exam Rhythm:Regular Rate:Normal     Neuro/Psych negative neurological ROS  negative psych ROS   GI/Hepatic negative GI ROS, Neg liver ROS,   Endo/Other  negative endocrine ROS  Renal/GU negative Renal ROS  negative genitourinary   Musculoskeletal negative musculoskeletal ROS (+)   Abdominal   Peds negative pediatric ROS (+)  Hematology negative hematology ROS (+)   Anesthesia Other Findings   Reproductive/Obstetrics negative OB ROS                             Anesthesia Physical Anesthesia Plan  ASA: III  Anesthesia Plan: General   Post-op Pain Management:    Induction: Intravenous  PONV Risk Score and Plan: 1 and Ondansetron and Dexamethasone  Airway Management Planned: LMA  Additional Equipment:   Intra-op Plan:   Post-operative Plan: Extubation in OR  Informed Consent: I have reviewed the patients History and Physical, chart, labs and discussed the procedure including the risks, benefits and alternatives for the proposed anesthesia with the patient or authorized representative who has indicated his/her understanding and acceptance.   Dental advisory given  Plan Discussed with: CRNA and Surgeon  Anesthesia Plan Comments:         Anesthesia Quick Evaluation

## 2017-04-04 NOTE — Anesthesia Procedure Notes (Signed)
Procedure Name: LMA Insertion Date/Time: 04/04/2017 8:43 AM Performed by: Noralyn Pick D Pre-anesthesia Checklist: Patient identified, Emergency Drugs available, Suction available and Patient being monitored Patient Re-evaluated:Patient Re-evaluated prior to induction Oxygen Delivery Method: Circle system utilized Preoxygenation: Pre-oxygenation with 100% oxygen Induction Type: IV induction Ventilation: Mask ventilation without difficulty LMA: LMA inserted LMA Size: 4.0 Tube type: Oral Number of attempts: 1 Placement Confirmation: positive ETCO2 and breath sounds checked- equal and bilateral Tube secured with: Tape Dental Injury: Teeth and Oropharynx as per pre-operative assessment

## 2017-04-04 NOTE — H&P (Signed)
PREOPERATIVE H&P  Chief Complaint: r kneee pain  HPI: Terry Wright is a 61 y.o. male who presents for evaluation of r knee pain . It has been present for greater than 3 months and has been worsening. He has failed conservative measures. Pain is rated as moderate.  Past Medical History:  Diagnosis Date  . CAD S/P percutaneous coronary angioplasty 08/04/2016   100% occluded proximal circumflex: PCI with 4.0 x 16 mm Synergy DES. Also noted 65% ostial OM 3 (relatively small caliber with planned medical management). EF 3545% with lateral hypokinesis.  Marland Kitchen DVT (deep venous thrombosis) ([facility])    Right upper arm DVT on 2 occasions in the remote past  . Dyslipidemia    Patient mentions triglycerides specifically  . Ejection fraction    LV function normal, echo, February, 2010  . GERD (gastroesophageal reflux disease)   . Hyperlipidemia   . Hypertension   . Mitral regurgitation    Mild, echo, February, 2010  . Palpitations    Mild in the past  . Statin intolerance    Felt poorly after Lipitor, Crestor, TriCor  . STEMI involving left circumflex coronary artery ([place]) 08/04/2016   Occluded very large caliber codominant circumflex - PCI with single DES  . Tobacco abuse    Past Surgical History:  Procedure Laterality Date  . CARDIAC CATHETERIZATION N/A 08/04/2016   Procedure: Left Heart Cath and Coronary Angiography;  Surgeon: Leonie Man, MD;  Location: [facility];  Service: Cardiovascular;  Laterality: N/A: 100% pCx --> PCI. residual 65% pOM3 (small).  ~EF 35-45%  with lateral HK.  Marland Kitchen CARDIAC CATHETERIZATION N/A 08/04/2016   Procedure: Coronary Stent Intervention;  Surgeon: Leonie Man, MD;  Location: [facility];  Service: Cardiovascular;  Laterality: N/A: pCx 100%-0%: Synergy DES 4.0 x 16 (4.5 mm)  . GANGLION CYST EXCISION     oct 2017  . HERNIA REPAIR     umbilical hernia 2017 oct  . TRANSTHORACIC ECHOCARDIOGRAM  08/05/2016   post STEMI: EF 40-45%. Mild  concentric LVH. Severe HK of basal-mid inferolateral wall consistent with infarct in this distribution. GR 1 DD  . TRANSTHORACIC ECHOCARDIOGRAM  01/11/2017   EF 50-55%. GR 1 DD. Mild to moderate LA dilation.   Social History   Social History  . Marital status: Married    Spouse name: N/A  . Number of children: N/A  . Years of education: N/A   Social History Main Topics  . Smoking status: Former Smoker    Packs/day: 1.00    Years: 40.00    Quit date: 2015  . Smokeless tobacco: Never Used  . Alcohol use 4.2 oz/week    7 Glasses of wine per week     Comment: occasional  . Drug use: No  . Sexual activity: Not Asked   Other Topics Concern  . None   Social History Narrative   Married 10 years in 2018. 2 step kids- 68 and 13. 3 kids but he does not have contact with. No grandkids      Retired Theatre stage manager.       Hobbies: golf (needs shoulder replacement though, working in the yard, working out- was doing this regularly even before MI- walking/treadmill   Family History  Problem Relation Age of Onset  . Heart attack Mother        x2- lived into 67s  . Hypertension Father        lived into 61s  . Other Brother  killed in [place]   No Known Allergies Prior to Admission medications   Medication Sig Start Date End Date Taking? Authorizing Provider  aspirin 81 MG chewable tablet Chew 1 tablet (81 mg total) by mouth daily. Patient taking differently: Chew 81 mg by mouth daily.  08/07/16  Yes Dunn, Ryan M, PA-C  carvedilol (COREG) 12.5 MG tablet TAKE 1 TABLET (12.5 MG TOTAL) BY MOUTH 2 (TWO) TIMES DAILY WITH A MEAL. 04/03/17  Yes Leonie Man, MD  LORazepam (ATIVAN) 0.5 MG tablet Take 1 tablet (0.5 mg total) by mouth 2 (two) times daily as needed for anxiety. 01/25/17  Yes Marin Olp, MD  pantoprazole (PROTONIX) 40 MG tablet Take 1 tablet (40 mg total) by mouth daily. 08/07/16  Yes Dunn, Areta Haber, PA-C  rosuvastatin (CRESTOR) 20 MG tablet Take 1 tablet (20 mg total) by  mouth daily. Patient taking differently: Take 20 mg by mouth every evening.  01/11/17 04/11/17 Yes Leonie Man, MD  ticagrelor (BRILINTA) 90 MG TABS tablet Take 1 tablet (90 mg total) by mouth 2 (two) times daily. Patient taking differently: Take 90 mg by mouth 2 (two) times daily.  08/06/16  Yes Dunn, Areta Haber, PA-C     Positive ROS: none  All other systems have been reviewed and were otherwise negative with the exception of those mentioned in the HPI and as above.  Physical Exam: Vitals:   04/04/17 0635  BP: 120/85  Pulse: 63  Resp: 16  Temp: (!) 97.1 F (36.2 C)  SpO2: 97%    General: Alert, no acute distress Cardiovascular: No pedal edema Respiratory: No cyanosis, no use of accessory musculature GI: No organomegaly, abdomen is soft and non-tender Skin: No lesions in the area of chief complaint Neurologic: Sensation intact distally Psychiatric: Patient is competent for consent with normal mood and affect Lymphatic: No axillary or cervical lymphadenopathy  MUSCULOSKELETAL: r knee +mcmurray -instability  MRI:+ mmt Assessment/Plan: RIGHT KNEE MEDIAL MENISCUS TEAR Plan for Procedure(s): ARTHROSCOPY RIGHT KNEE  The risks benefits and alternatives were discussed with the patient including but not limited to the risks of nonoperative treatment, versus surgical intervention including infection, bleeding, nerve injury, malunion, nonunion, hardware prominence, hardware failure, need for hardware removal, blood clots, cardiopulmonary complications, morbidity, mortality, among others, and they were willing to proceed.  Predicted outcome is good, although there will be at least a six to nine month expected recovery.  Kanaya Gunnarson L, MD 04/04/2017 8:27 AM

## 2017-04-04 NOTE — Brief Op Note (Signed)
04/04/2017  9:09 AM  PATIENT:  Terry Wright  61 y.o. male  PRE-OPERATIVE DIAGNOSIS:  RIGHT KNEE MEDIAL MENISCUS TEAR  POST-OPERATIVE DIAGNOSIS:  RIGHT KNEE MEDIAL MENISCUS TEAR  PROCEDURE:  Procedure(s): ARTHROSCOPY RIGHT KNEE (Right) CHONDROPLASTY (Right)  SURGEON:  Surgeon(s) and Role:    Dorna Leitz, MD - Primary  PHYSICIAN ASSISTANT:   ASSISTANTS: bethune   ANESTHESIA:   general  EBL:  No intake/output data recorded.  BLOOD ADMINISTERED:none  DRAINS: none   LOCAL MEDICATIONS USED:  MARCAINE     SPECIMEN:  No Specimen  DISPOSITION OF SPECIMEN:  N/A  COUNTS:  YES  TOURNIQUET:  * No tourniquets in log *  DICTATION: .Other Dictation: Dictation Number [id number]  PLAN OF CARE: Admit to inpatient   PATIENT DISPOSITION:  PACU - hemodynamically stable.   Delay start of Pharmacological VTE agent (>24hrs) due to surgical blood loss or risk of bleeding: no

## 2017-04-05 NOTE — Op Note (Signed)
NAME:  DENZEL, ETIENNE NO.:  MEDICAL RECORD NO.:  [id number]  LOCATION:                                 FACILITY:  PHYSICIAN:  Alta Corning, M.D.        DATE OF BIRTH:  DATE OF PROCEDURE:  04/04/2017 DATE OF DISCHARGE:                              OPERATIVE REPORT   SERVICE:  Orthopedic Surgery  PREOPERATIVE DIAGNOSIS:  Meniscal tear.  POSTOPERATIVE DIAGNOSES: 1. Meniscal tear. 2. 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 3. Medial shelf plica.  PROCEDURES PERFORMED: 1. Right knee arthroscopy with partial medial meniscectomy. 2. Chondroplasty, medial femoral condyle, and patellofemoral joint     down to bleeding bone were necessary. 3. Medial plica excision.  SURGEON:  Alta Corning, M.D.  ASSISTANT:  Gary Fleet, Pa.  ANESTHESIA:  General.  BRIEF HISTORY:  Mr. Schaffert is a 61 year old male with long history and significant complaints of right knee pain.  MRI showed significant medial meniscal tear.  He was having significant symptoms.  He had a heart attack about 8 months ago, and we felt that it needed to be done at the main hospital because of this, but ultimately, he was relatively asymptomatic, but his knee symptoms were so bad that we felt it needed to be addressed, even though he was not a year out from his heart attack.  He was brought to the operating room for this procedure.  DESCRIPTION OF PROCEDURE:  The patient was brought to the operating room and after adequate anesthesia was obtained with general anesthetic, the patient was placed supine on the operating room table.  The right leg was prepped and draped in usual sterile fashion.  Following this, routine arthroscopic examination of the knee revealed there was obvious chondromalacia of the patellofemoral joint, it is down to bone, we debrided this to a smooth and stable rim and down to bleeding bone were necessary.  Attention turned to the medial  compartment.  There was a large medial shelf plica, which was draping and preventing entrance into the medial compartment.  This was debrided back to the wall, the capsule.  Following this, we went into the medial compartment where the medial meniscus was complex.  It was a significant radial tear right at the root.  We debrided the lateral aspect of this and then we went medially to a smooth and stable rim.  A sucker shaver was used to get to the rim.  At this point, the wounds were irrigated, suctioned dry.  The medial portal had to be extended to get into that back part of the knee and so this was closed with 2-0 nylon interrupted stitches and the lateral portal was left open.  At this point, the knee had been irrigated and suctioned dry.  We closed those portals, put in 25 mL of 0.25% Marcaine for postoperative anesthesia.  Sterile compressive dressing was applied.  The patient was taken to the recovery room and was noted to be in satisfactory condition.  Estimated blood loss for procedure was minimal.     Alta Corning, M.D.     Corliss Skains  D:  04/04/2017  T:  04/04/2017  Job:  [id number]  cc:   Alta Corning, M.D. Fax: [phone]

## 2017-04-17 DIAGNOSIS — M25661 Stiffness of right knee, not elsewhere classified: Secondary | ICD-10-CM | POA: Diagnosis not present

## 2017-04-17 DIAGNOSIS — M25561 Pain in right knee: Secondary | ICD-10-CM | POA: Diagnosis not present

## 2017-04-18 DIAGNOSIS — M25661 Stiffness of right knee, not elsewhere classified: Secondary | ICD-10-CM | POA: Diagnosis not present

## 2017-04-18 DIAGNOSIS — M25561 Pain in right knee: Secondary | ICD-10-CM | POA: Diagnosis not present

## 2017-04-19 ENCOUNTER — Telehealth: Payer: Self-pay | Admitting: *Deleted

## 2017-04-19 DIAGNOSIS — Z79899 Other long term (current) drug therapy: Secondary | ICD-10-CM

## 2017-04-19 DIAGNOSIS — E785 Hyperlipidemia, unspecified: Secondary | ICD-10-CM

## 2017-04-19 NOTE — Telephone Encounter (Signed)
Mail lab slip and letter 

## 2017-04-19 NOTE — Telephone Encounter (Signed)
-----   Message from Raiford Simmonds, RN sent at 02/12/2017 10:57 AM EDT ----- LABS DUE 10/16 18---CMP, LIPIDS MAIL SEPT 2018

## 2017-04-24 DIAGNOSIS — M25561 Pain in right knee: Secondary | ICD-10-CM | POA: Diagnosis not present

## 2017-04-24 DIAGNOSIS — M25661 Stiffness of right knee, not elsewhere classified: Secondary | ICD-10-CM | POA: Diagnosis not present

## 2017-04-26 ENCOUNTER — Ambulatory Visit: Payer: 59 | Admitting: Family Medicine

## 2017-04-26 DIAGNOSIS — M25661 Stiffness of right knee, not elsewhere classified: Secondary | ICD-10-CM | POA: Diagnosis not present

## 2017-04-26 DIAGNOSIS — M25561 Pain in right knee: Secondary | ICD-10-CM | POA: Diagnosis not present

## 2017-05-01 DIAGNOSIS — M25561 Pain in right knee: Secondary | ICD-10-CM | POA: Diagnosis not present

## 2017-05-01 DIAGNOSIS — M25661 Stiffness of right knee, not elsewhere classified: Secondary | ICD-10-CM | POA: Diagnosis not present

## 2017-05-01 DIAGNOSIS — M25512 Pain in left shoulder: Secondary | ICD-10-CM | POA: Diagnosis not present

## 2017-05-03 DIAGNOSIS — M25561 Pain in right knee: Secondary | ICD-10-CM | POA: Diagnosis not present

## 2017-05-03 DIAGNOSIS — M25661 Stiffness of right knee, not elsewhere classified: Secondary | ICD-10-CM | POA: Diagnosis not present

## 2017-05-04 DIAGNOSIS — Z23 Encounter for immunization: Secondary | ICD-10-CM | POA: Diagnosis not present

## 2017-05-07 ENCOUNTER — Encounter: Payer: Self-pay | Admitting: Family Medicine

## 2017-05-07 ENCOUNTER — Ambulatory Visit (INDEPENDENT_AMBULATORY_CARE_PROVIDER_SITE_OTHER): Payer: 59 | Admitting: Family Medicine

## 2017-05-07 VITALS — BP 114/82 | HR 61 | Temp 97.7°F | Ht 73.0 in | Wt 209.6 lb

## 2017-05-07 DIAGNOSIS — F410 Panic disorder [episodic paroxysmal anxiety] without agoraphobia: Secondary | ICD-10-CM

## 2017-05-07 DIAGNOSIS — Z87891 Personal history of nicotine dependence: Secondary | ICD-10-CM | POA: Diagnosis not present

## 2017-05-07 DIAGNOSIS — E785 Hyperlipidemia, unspecified: Secondary | ICD-10-CM | POA: Diagnosis not present

## 2017-05-07 DIAGNOSIS — I2510 Atherosclerotic heart disease of native coronary artery without angina pectoris: Secondary | ICD-10-CM | POA: Diagnosis not present

## 2017-05-07 DIAGNOSIS — I10 Essential (primary) hypertension: Secondary | ICD-10-CM | POA: Diagnosis not present

## 2017-05-07 DIAGNOSIS — Z9861 Coronary angioplasty status: Secondary | ICD-10-CM

## 2017-05-07 MED ORDER — LORAZEPAM 0.5 MG PO TABS: 0.5000 mg | ORAL_TABLET | Freq: Two times a day (BID) | ORAL | 2 refills | Status: DC | PRN

## 2017-05-07 NOTE — Assessment & Plan Note (Signed)
S: MI earlier this year but doing very well. Completed cardiac rehab. Compliant with aspirin 81mg , atorvastatin 40mg , coreg 6.25mg  BID, brilinta (will be 1 year). No chest pain. SOB with brilinta- not worsening A/P: continue current medications

## 2017-05-07 NOTE — Assessment & Plan Note (Signed)
S: well controlled on atorvastatin 40mg  daily with LDL <70. Atorvastatin switched off- had mylagias and doing better on crestor 20mg - has upcoming labs planned Lab Results  Component Value Date   CHOL 101 11/03/2016   HDL 29 (L) 11/03/2016   LDLCALC 50 11/03/2016   LDLDIRECT 159.1 06/05/2011   TRIG 111 11/03/2016   CHOLHDL 3.5 11/03/2016   A/P: doing very well, continue current meds. Has fasting labs coming up with Dr. Ellyn Hack

## 2017-05-07 NOTE — Assessment & Plan Note (Addendum)
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 Uses lorazepam 0.5mg  at night- mind races, gets worked up.   We had discussed SSRI trial instead of daily ativan  A/P:Meniscus repair in September. May have to have shoulder replacement on the left. We discussed wanting to be in a more stable position before trial of SSRI- he agrees- will ontinue ativan 0.5mg  qhs for now

## 2017-05-07 NOTE — Progress Notes (Signed)
Subjective:  Hadriel Northup is a 61 y.o. year old very pleasant male patient who presents for/with See problem oriented charting ROS- mild not worsening SOB on brilinta. No chest pain. No swelling other than in right leg where had surgery. No headaches or blurry vision   Past Medical History-  Patient Active Problem List   Diagnosis Date Noted  . CAD S/P percutaneous coronary angioplasty 09/21/2016    Priority: High  . Panic attacks 09/21/2016    Priority: High  . History of DVT (deep vein thrombosis)     Priority: High  . Former smoker     Priority: High  . Hyperglycemia 09/21/2016    Priority: Medium  . Dyslipidemia     Priority: Medium  . Essential hypertension     Priority: Medium  . Statin intolerance     Priority: Medium  . Mitral regurgitation     Priority: Low  . GERD (gastroesophageal reflux disease)     Priority: Low  . Ventricular bigeminy     Priority: Low  . Acute medial meniscus tear of right knee 04/04/2017  . 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 Ischemic cardiomyopathy 10/12/2016    Medications- reviewed and updated Current Outpatient Prescriptions  Medication Sig Dispense Refill  . aspirin 81 MG chewable tablet Chew 1 tablet (81 mg total) by mouth daily. (Patient taking differently: Chew 81 mg by mouth daily. ) 30 tablet 11  . carvedilol (COREG) 12.5 MG tablet TAKE 1 TABLET (12.5 MG TOTAL) BY MOUTH 2 (TWO) TIMES DAILY WITH A MEAL. 60 tablet 5  . LORazepam (ATIVAN) 0.5 MG tablet Take 1 tablet (0.5 mg total) by mouth 2 (two) times daily as needed for anxiety. 40 tablet 2  . pantoprazole (PROTONIX) 40 MG tablet Take 1 tablet (40 mg total) by mouth daily. 30 tablet 11  . ticagrelor (BRILINTA) 90 MG TABS tablet Take 1 tablet (90 mg total) by mouth 2 (two) times daily. 60 tablet 11  . rosuvastatin (CRESTOR) 20 MG tablet Take 1 tablet (20 mg total) by mouth daily. (Patient taking differently: Take 20 mg by mouth every evening. ) 90 tablet 3   No  current facility-administered medications for this visit.     Objective: BP 114/82 (BP Location: Left Arm, Patient Position: Sitting, Cuff Size: Large)   Pulse 61   Temp 97.7 F (36.5 C) (Oral)   Ht 6\' 1"  (1.854 m)   Wt 209 lb 9.6 oz (95.1 kg)   SpO2 96%   BMI 27.65 kg/m  Gen: NAD, resting comfortably CV: RRR no murmurs rubs or gallops Lungs: CTAB no crackles, wheeze, rhonchi Abdomen: soft/nontender/nondistended/active bowel sounds. overweight Ext: no edema Skin: warm, dry  Assessment/Plan:  2013 colonoscopy- still looking for records. Roselyn Reef to reach out.   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 Uses lorazepam 0.5mg  at night- mind races, gets worked up.   We had discussed SSRI trial instead of daily ativan  A/P:Meniscus repair in September. May have to have shoulder replacement on the left. We discussed wanting to be in a more stable position before trial of SSRI- he agrees- will ontinue ativan 0.5mg  qhs for now  CAD S/P percutaneous coronary angioplasty S: MI earlier this year but doing very well. Completed cardiac rehab. Compliant with aspirin 81mg , atorvastatin 40mg , coreg 6.25mg  BID, brilinta (will be 1 year). No chest pain. SOB with brilinta- not worsening A/P: continue current medications  Essential hypertension S: controlled on coreg 6.25mg  BID  alone BP Readings from Last 3 Encounters:  05/07/17 114/82  04/04/17 105/61  03/23/17 115/74  A/P:  blood pressure goal of <140/90. Continue current meds  Dyslipidemia S: well controlled on atorvastatin 40mg  daily with LDL <70. Atorvastatin switched off- had mylagias and doing better on crestor 20mg - has upcoming labs planned Lab Results  Component Value Date   CHOL 101 11/03/2016   HDL 29 (L) 11/03/2016   LDLCALC 50 11/03/2016   LDLDIRECT 159.1 06/05/2011   TRIG 111 11/03/2016   CHOLHDL 3.5 11/03/2016   A/P: doing very well, continue current meds. Has fasting labs coming up with  Dr. Ellyn Hack  Former smoker Quit smoking 2015. 40 pack years. Agrees to lung cancer screening program- refer today  Future Appointments Date Time Provider [place]  05/30/2017 10:40 AM Leonie Man, MD [facility]   6 months  Orders Placed This Encounter  Procedures  . Ambulatory Referral for Lung Cancer Scre    Referral Priority:   Routine    Referral Type:   Consultation    Referral Reason:   Specialty Services Required    Number of Visits Requested:   1    Meds ordered this encounter  Medications  . LORazepam (ATIVAN) 0.5 MG tablet    Sig: Take 1 tablet (0.5 mg total) by mouth 2 (two) times daily as needed for anxiety.    Dispense:  40 tablet    Refill:  2    Return precautions advised.  Garret Reddish, MD

## 2017-05-07 NOTE — Patient Instructions (Addendum)
  We will call you within a week or two about your referral to lung cancer screening program. If you do not hear within 3 weeks, give [place] a call.   No change in meds- if things more stable next time consider medicine like zoloft or lexapro to help reduce overall anxiety/panic attacks

## 2017-05-07 NOTE — Assessment & Plan Note (Signed)
Quit smoking 2015. 40 pack years. Agrees to lung cancer screening program- refer today

## 2017-05-07 NOTE — Assessment & Plan Note (Signed)
S: controlled on coreg 6.25mg  BID alone BP Readings from Last 3 Encounters:  05/07/17 114/82  04/04/17 105/61  03/23/17 115/74  A/P:  blood pressure goal of <140/90. Continue current meds

## 2017-05-08 DIAGNOSIS — M25561 Pain in right knee: Secondary | ICD-10-CM | POA: Diagnosis not present

## 2017-05-08 DIAGNOSIS — M25661 Stiffness of right knee, not elsewhere classified: Secondary | ICD-10-CM | POA: Diagnosis not present

## 2017-05-09 DIAGNOSIS — E785 Hyperlipidemia, unspecified: Secondary | ICD-10-CM | POA: Diagnosis not present

## 2017-05-09 DIAGNOSIS — Z79899 Other long term (current) drug therapy: Secondary | ICD-10-CM | POA: Diagnosis not present

## 2017-05-09 LAB — LIPID PANEL
Chol/HDL Ratio: 3.1 ratio (ref 0.0–5.0)
Cholesterol, Total: 110 mg/dL (ref 100–199)
HDL: 35 mg/dL — ABNORMAL LOW (ref >39)
LDL Calculated: 46 mg/dL (ref 0–99)
Triglycerides: 143 mg/dL (ref 0–149)
VLDL Cholesterol Cal: 29 mg/dL (ref 5–40)

## 2017-05-09 LAB — COMPREHENSIVE METABOLIC PANEL
ALT: 23 IU/L (ref 0–44)
AST: 23 IU/L (ref 0–40)
Albumin/Globulin Ratio: 1.9 (ref 1.2–2.2)
Albumin: 4.5 g/dL (ref 3.6–4.8)
Alkaline Phosphatase: 55 IU/L (ref 39–117)
BUN/Creatinine Ratio: 18 (ref 10–24)
BUN: 18 mg/dL (ref 8–27)
Bilirubin Total: 0.6 mg/dL (ref 0.0–1.2)
CO2: 24 mmol/L (ref 20–29)
Calcium: 9.6 mg/dL (ref 8.6–10.2)
Chloride: 103 mmol/L (ref 96–106)
Creatinine, Ser: 0.98 mg/dL (ref 0.76–1.27)
GFR calc Af Amer: 95 mL/min/{1.73_m2} (ref >59)
GFR calc non Af Amer: 82 mL/min/{1.73_m2} (ref >59)
Globulin, Total: 2.4 g/dL (ref 1.5–4.5)
Glucose: 111 mg/dL — ABNORMAL HIGH (ref 65–99)
Potassium: 4.9 mmol/L (ref 3.5–5.2)
Sodium: 144 mmol/L (ref 134–144)
Total Protein: 6.9 g/dL (ref 6.0–8.5)

## 2017-05-10 DIAGNOSIS — M25661 Stiffness of right knee, not elsewhere classified: Secondary | ICD-10-CM | POA: Diagnosis not present

## 2017-05-10 DIAGNOSIS — M25561 Pain in right knee: Secondary | ICD-10-CM | POA: Diagnosis not present

## 2017-05-14 ENCOUNTER — Other Ambulatory Visit: Payer: Self-pay | Admitting: Acute Care

## 2017-05-14 DIAGNOSIS — Z87891 Personal history of nicotine dependence: Secondary | ICD-10-CM

## 2017-05-14 DIAGNOSIS — Z122 Encounter for screening for malignant neoplasm of respiratory organs: Secondary | ICD-10-CM

## 2017-05-18 ENCOUNTER — Ambulatory Visit (INDEPENDENT_AMBULATORY_CARE_PROVIDER_SITE_OTHER): Payer: 59 | Admitting: Acute Care

## 2017-05-18 ENCOUNTER — Ambulatory Visit (INDEPENDENT_AMBULATORY_CARE_PROVIDER_SITE_OTHER)
Admission: RE | Admit: 2017-05-18 | Discharge: 2017-05-18 | Disposition: A | Discharge status: Home / Self Care | Payer: 59 | Source: Ambulatory Visit | Attending: Acute Care | Admitting: Acute Care

## 2017-05-18 ENCOUNTER — Encounter: Payer: Self-pay | Admitting: Acute Care

## 2017-05-18 DIAGNOSIS — Z87891 Personal history of nicotine dependence: Secondary | ICD-10-CM

## 2017-05-18 DIAGNOSIS — Z122 Encounter for screening for malignant neoplasm of respiratory organs: Secondary | ICD-10-CM

## 2017-05-18 NOTE — Progress Notes (Signed)
Shared Decision Making Visit Lung Cancer Screening Program [phone])   Eligibility:  Age 61 y.o.  Pack Years Smoking History Calculation 43-pack-year smoking history (# packs/per year x # years smoked)  Recent History of coughing up blood  no  Unexplained weight loss? no ( >Than 15 pounds within the last 6 months )  Prior History Lung / other cancer no (Diagnosis within the last 5 years already requiring surveillance chest CT Scans).  Smoking Status Former Smoker  Former Smokers: Years since quit: Not applicable  Quit Date: Not applicable  Visit Components:  Discussion included one or more decision making aids. yes  Discussion included risk/benefits of screening. yes  Discussion included potential follow up diagnostic testing for abnormal scans. yes  Discussion included meaning and risk of over diagnosis. yes  Discussion included meaning and risk of False Positives. yes  Discussion included meaning of total radiation exposure. yes  Counseling Included:  Importance of adherence to annual lung cancer LDCT screening. yes  Impact of comorbidities on ability to participate in the program. yes  Ability and willingness to under diagnostic treatment. yes  Smoking Cessation Counseling:  Current Smokers:   Discussed importance of smoking cessation. Not applicable  Information about tobacco cessation classes and interventions provided to patient. Not applicable  Patient provided with "ticket" for LDCT Scan. yes  Symptomatic Patient. no  Counseling  Diagnosis Code: Tobacco Use Z72.0  Asymptomatic Patient yes  Counseling (Intermediate counseling: > three minutes counseling) Z7673  Former Smokers:   Discussed the importance of maintaining cigarette abstinence. yes  Diagnosis Code: Personal History of Nicotine Dependence. A19.379  Information about tobacco cessation classes and interventions provided to patient. Yes  Patient provided with "ticket" for LDCT Scan.  yes  Written Order for Lung Cancer Screening with LDCT placed in Epic. Yes (CT Chest Lung Cancer Screening Low Dose W/O CM) [id number] Z12.2-Screening of respiratory organs Z87.891-Personal history of nicotine dependence  I spent 25 minutes of face to face time with Mr. Ruffin Pyo discussing the risks and benefits of lung cancer screening. We viewed a power point together that explained in detail the above noted topics. We took the time to pause the power point at intervals to allow for questions to be asked and answered to ensure understanding. We discussed that he had taken the single most powerful action possible to decrease his risk of developing lung cancer when he quit smoking. I counseled him to remain smoke free, and to contact me if he ever had the desire to smoke again so that I can provide resources and tools to help support the effort to remain smoke free. We discussed the time and location of the scan, and that either  Doroteo Glassman RN or I will call with the results within  24-48 hours of receiving them. Mr. Ruffin Pyo has my card and contact information in the event he needs to speak with me, in addition to a copy of the power point we reviewed as a resource. Mr. Ruffin Pyo verbalized understanding of all of the above and had no further questions upon leaving the office.     I explained to the patient that there has been a high incidence of coronary artery disease noted on these exams. I explained that this is a non-gated exam therefore degree or severity cannot be determined. This patient is currently on statin therapy. I have asked the patient to follow-up with their PCP regarding any incidental finding of coronary artery disease and management with diet or medication as they  feel is clinically indicated. The patient verbalized understanding of the above and had no further questions.     Magdalen Spatz, NP 05/18/2017

## 2017-05-24 ENCOUNTER — Other Ambulatory Visit: Payer: Self-pay | Admitting: Acute Care

## 2017-05-24 DIAGNOSIS — Z87891 Personal history of nicotine dependence: Secondary | ICD-10-CM

## 2017-05-24 DIAGNOSIS — Z122 Encounter for screening for malignant neoplasm of respiratory organs: Secondary | ICD-10-CM

## 2017-05-29 DIAGNOSIS — M67912 Unspecified disorder of synovium and tendon, left shoulder: Secondary | ICD-10-CM | POA: Diagnosis not present

## 2017-05-30 ENCOUNTER — Ambulatory Visit (INDEPENDENT_AMBULATORY_CARE_PROVIDER_SITE_OTHER): Payer: 59 | Admitting: Cardiology

## 2017-05-30 ENCOUNTER — Encounter: Payer: Self-pay | Admitting: Cardiology

## 2017-05-30 ENCOUNTER — Other Ambulatory Visit: Payer: Self-pay | Admitting: Orthopedic Surgery

## 2017-05-30 VITALS — BP 106/70 | HR 58 | Ht 73.0 in | Wt 212.6 lb

## 2017-05-30 DIAGNOSIS — I499 Cardiac arrhythmia, unspecified: Secondary | ICD-10-CM

## 2017-05-30 DIAGNOSIS — I2510 Atherosclerotic heart disease of native coronary artery without angina pectoris: Secondary | ICD-10-CM

## 2017-05-30 DIAGNOSIS — Z9861 Coronary angioplasty status: Secondary | ICD-10-CM | POA: Diagnosis not present

## 2017-05-30 DIAGNOSIS — I498 Other specified cardiac arrhythmias: Secondary | ICD-10-CM

## 2017-05-30 DIAGNOSIS — M67912 Unspecified disorder of synovium and tendon, left shoulder: Secondary | ICD-10-CM

## 2017-05-30 DIAGNOSIS — I10 Essential (primary) hypertension: Secondary | ICD-10-CM | POA: Diagnosis not present

## 2017-05-30 DIAGNOSIS — I255 Ischemic cardiomyopathy: Secondary | ICD-10-CM

## 2017-05-30 DIAGNOSIS — E785 Hyperlipidemia, unspecified: Secondary | ICD-10-CM

## 2017-05-30 NOTE — Progress Notes (Signed)
PCP: Marin Olp, MD  Clinic Note: Chief Complaint  Patient presents with  . Follow-up  . Coronary Artery Disease    HPI: Terry Wright is a 61 y.o. male with a PMH below who presents today for 3 month follow-up for CAD-PCI. He has history of inferior-posterior STEMI,/VT arrest 08/06/2016. He was shocked twice for VT/VF, and cardiac catheter position revealed an occluded circumflex. With 2 DES stents. Initial EF by LV gram was 35-40% increase to 40 and 45% by echo.2-D echo 01/11/2017: EF 50-55%. GR 1 DD. Mild to moderate LA dilation.   He has completed cardiac rehabilitation; he has had issues with Ventricular bigeminy during cardiac rehabilitation. We tried to increase his Coreg up to 18.75 twice daily, but had to reduce back to 12.5 mg twice a day. He did not tolerate 18.75 mg dose.   Terry Wright was last seen on February 12, 2017 mostly for preop evaluation for knee surgery.  He was doing well from a cardiac standpoint.  Still noticing some twinges from postop changes.  Recent Hospitalizations:   Knee Sgx (arthorscopic) Sept 2018  Studies Personally Reviewed - (if available, images/films reviewed: From Epic Chart or Care Everywhere)    Converted to Crestor 20 mg daily because of leg pains on atorvastatin  Interval History: Terry Wright returns today doing quite well.  He is routinely exercising without any real major difficulties.  He says his palpitations have improved significantly.  He is recovering well from his knee surgery and is now pretty much fully back recovered.  He says the discomfort in his chest is less and less frequent now.  He does occasionally still noticed some little short of breath spells related to the Brilinta but nothing like exertional dyspnea.  No resting or exertional chest tightness or pressure. Denies any heart failure symptoms of PND, orthopnea or edema.  No rapid irregular heartbeats or palpitations.  No lightheadedness, dizziness or  syncope/near syncope.  No TIA or amaurosis fugax symptoms. He still walks couple miles a day.. No claudication.  ROS: A comprehensive was performed. Review of Systems  Constitutional: Negative for malaise/fatigue.  HENT: Negative for nosebleeds.   Respiratory: Positive for shortness of breath (Still has mild shortness of breath with.  Brilinta). Negative for cough, sputum production and wheezing.   Cardiovascular:       Per history of present illness  Gastrointestinal: Negative for blood in stool and melena.  Genitourinary: Negative for hematuria.  Musculoskeletal: Positive for joint pain (Left shoulder, right knee). Negative for falls and myalgias (Notably improved since switching to Crestor).  Neurological: Positive for dizziness (Occasional positional dizziness).  Endo/Heme/Allergies: Bruises/bleeds easily.  Psychiatric/Behavioral: Negative for depression and memory loss. 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 He does have occasional panic/anxiety attacks.).   All other systems reviewed and are negative.   I have reviewed and (if needed) personally updated the patient's problem list, medications, allergies, past medical and surgical history, social and family history.   Past Medical History:  Diagnosis Date  . CAD S/P percutaneous coronary angioplasty 08/04/2016   100% occluded proximal circumflex: PCI with 4.0 x 16 mm Synergy DES. Also noted 65% ostial OM 3 (relatively small caliber with planned medical management). EF 3545% with lateral hypokinesis.  Marland Kitchen DVT (deep venous thrombosis) (HCC)    Right upper arm DVT on 2 occasions in the remote past  . Dyslipidemia  Patient mentions triglycerides specifically  . Ejection fraction    LV function normal, echo, February, 2010  . GERD (gastroesophageal reflux disease)   . Hyperlipidemia   . Hypertension   . Mitral regurgitation    Mild, echo, February,  2010  . Palpitations    Mild in the past  . Statin intolerance    Felt poorly after Lipitor, Crestor, TriCor  . STEMI involving left circumflex coronary artery ([place]) 08/04/2016   Occluded very large caliber codominant circumflex - PCI with single DES  . Tobacco abuse     Past Surgical History:  Procedure Laterality Date  . CARDIAC CATHETERIZATION N/A 08/04/2016   Procedure: Left Heart Cath and Coronary Angiography;  Surgeon: Leonie Man, MD;  Location: [facility];  Service: Cardiovascular;  Laterality: N/A: 100% pCx --> PCI. residual 65% pOM3 (small).  ~EF 35-45%  with lateral HK.  Marland Kitchen CARDIAC CATHETERIZATION N/A 08/04/2016   Procedure: Coronary Stent Intervention;  Surgeon: Leonie Man, MD;  Location: [facility];  Service: Cardiovascular;  Laterality: N/A: pCx 100%-0%: Synergy DES 4.0 x 16 (4.5 mm)  . CHONDROPLASTY Right 04/04/2017   Procedure: CHONDROPLASTY;  Surgeon: Dorna Leitz, MD;  Location: [facility];  Service: Orthopedics;  Laterality: Right;  . GANGLION CYST EXCISION     oct 2017  . HERNIA REPAIR     umbilical hernia 2017 oct  . KNEE ARTHROSCOPY Right 04/04/2017   Procedure: ARTHROSCOPY RIGHT KNEE, WITH MEIDAL FEMORAL CONDYLE PATELLAR MEDIAL FEMORAL JOINT PLICA EXCISION ;  Surgeon: Dorna Leitz, MD;  Location: [facility];  Service: Orthopedics;  Laterality: Right;  . TRANSTHORACIC ECHOCARDIOGRAM  08/05/2016   post STEMI: EF 40-45%. Mild concentric LVH. Severe HK of basal-mid inferolateral wall consistent with infarct in this distribution. GR 1 DD  . TRANSTHORACIC ECHOCARDIOGRAM  01/11/2017   EF 50-55%. GR 1 DD. Mild to moderate LA dilation.   Diagnostic Diagram                                                           Post-Intervention Diagram               Current Meds  Medication Sig  . carvedilol (COREG) 12.5 MG tablet TAKE 1 TABLET (12.5 MG TOTAL) BY MOUTH 2 (TWO) TIMES DAILY WITH A MEAL.  Marland Kitchen LORazepam (ATIVAN) 0.5 MG tablet Take 1 tablet (0.5 mg total) by  mouth 2 (two) times daily as needed for anxiety.  . pantoprazole (PROTONIX) 40 MG tablet Take 1 tablet (40 mg total) by mouth daily.  . rosuvastatin (CRESTOR) 20 MG tablet Take 1 tablet (20 mg total) by mouth daily. (Patient taking differently: Take 20 mg by mouth every evening. )  . ticagrelor (BRILINTA) 90 MG TABS tablet Take 1 tablet (90 mg total) by mouth 2 (two) times daily.  . [DISCONTINUED] aspirin 81 MG chewable tablet Chew 1 tablet (81 mg total) by mouth daily. (Patient taking differently: Chew 81 mg by mouth daily. )    No Known Allergies  Social History   Social History  . Marital status: Married    Spouse name: N/A  . Number of children: N/A  . Years of education: N/A   Social History Main Topics  . Smoking status: Former Smoker    Packs/day: 1.00  Years: 43.00    Quit date: 2015  . Smokeless tobacco: Never Used     Comment: Former smoker quit 2015  . Alcohol use 4.2 oz/week    7 Glasses of wine per week     Comment: occasional  . Drug use: No  . Sexual activity: Not Asked   Other Topics Concern  . None   Social History Narrative   Married 10 years in 2018. 2 step kids- 65 and 108. 3 kids but he does not have contact with. No grandkids      Retired Theatre stage manager.       Hobbies: golf (needs shoulder replacement though, working in the yard, working out- was doing this regularly even before MI- walking/treadmill    family history includes Heart attack in his mother; Hypertension in his father; Other in his brother.  Wt Readings from Last 3 Encounters:  05/30/17 212 lb 9.6 oz (96.4 kg)  05/07/17 209 lb 9.6 oz (95.1 kg)  04/04/17 210 lb (95.3 kg)    PHYSICAL EXAM BP 106/70   Pulse (!) 58   Ht 6\' 1"  (1.854 m)   Wt 212 lb 9.6 oz (96.4 kg)   SpO2 98%   BMI 28.05 kg/m   Physical Exam  Constitutional: He is oriented to person, place, and time. He appears well-developed and well-nourished. No distress.  Healthy-appearing.  Well-groomed  HENT:  Head:  Normocephalic and atraumatic.  Eyes: EOM are normal. No scleral icterus.  Neck: Normal range of motion. Neck supple. No hepatojugular reflux and no JVD present. Carotid bruit is not present.  Cardiovascular: Regular rhythm, normal heart sounds and normal pulses.   No extrasystoles are present. Bradycardia present.  PMI is not displaced.  Exam reveals no gallop and no friction rub.   No murmur heard. Pulmonary/Chest: Effort normal and breath sounds normal. No respiratory distress. He has no wheezes. He has no rales.  Abdominal: Soft. Bowel sounds are normal. He exhibits no distension. There is no tenderness. There is no rebound.  Musculoskeletal: Normal range of motion. He exhibits no edema or deformity.  Neurological: He is alert and oriented to person, place, and time.  Skin: Skin is warm and dry. No rash noted. No erythema.  Psychiatric: He has a normal mood and affect. His behavior is normal. Judgment and thought content normal.  Nursing note and vitals reviewed.   Adult ECG Report n/a  Other studies Reviewed: Additional studies/ records that were reviewed today include:  Recent Labs:   Lab Results  Component Value Date   CHOL 110 05/09/2017   HDL 35 (L) 05/09/2017   LDLCALC 46 05/09/2017   LDLDIRECT 159.1 06/05/2011   TRIG 143 05/09/2017   CHOLHDL 3.1 05/09/2017   Lab Results  Component Value Date   CREATININE 0.98 05/09/2017   BUN 18 05/09/2017   NA 144 05/09/2017   K 4.9 05/09/2017   CL 103 05/09/2017   CO2 24 05/09/2017    ASSESSMENT / PLAN: Problem List Items Addressed This Visit    CAD S/P percutaneous coronary angioplasty (Chronic)    Doing well.  No further anginal symptoms. At this point we can stop aspirin discontinued until end of January.  We will then switch over to Plavix. He is on stable dose of carvedilol and rosuvastatin.      Dyslipidemia, goal LDL below 70 (Chronic)    Overall has been better controlled on Crestor 20 mg daily.  Current labs show  LDL well within goal.  Essential hypertension (Chronic)    Blood pressure looks good today.  He is on carvedilol currently listed as 12.5 mg twice daily.  Goal blood pressure is actually closer to 130/80.  Well within target range.      Ischemic cardiomyopathy - Primary (Chronic)    Overall improved EF relatively back to normal levels.  No significant regional wall motion normality.  No heart failure symptoms.  Essentially resolved       Ventricular bigeminy (Chronic)    Well-controlled on beta-blocker.         Current medicines are reviewed at length with the patient today. (+/- concerns) n/a The following changes have been made: n/a  Patient Instructions  MEDICATION CHANGES  STOP ASPIRIN  NO OTHER CHANGES     Your physician recommends that you schedule a follow-up appointment in FEB 2019 [place].     If you need a refill on your cardiac medications before your next appointment, please call your pharmacy.    Studies Ordered:   No orders of the defined types were placed in this encounter.     [name], M.D., M.S. Interventional Cardiologist   Pager # [phone] Phone # [phone] [address] [place], [place] [address]

## 2017-05-30 NOTE — Patient Instructions (Addendum)
MEDICATION CHANGES  STOP ASPIRIN  NO OTHER CHANGES     Your physician recommends that you schedule a follow-up appointment in FEB 2019 [place].     If you need a refill on your cardiac medications before your next appointment, please call your pharmacy.

## 2017-06-01 ENCOUNTER — Encounter: Payer: Self-pay | Admitting: Cardiology

## 2017-06-01 NOTE — Assessment & Plan Note (Signed)
Well-controlled on beta-blocker. 

## 2017-06-01 NOTE — Assessment & Plan Note (Signed)
Overall improved EF relatively back to normal levels.  No significant regional wall motion normality.  No heart failure symptoms.  Essentially resolved

## 2017-06-01 NOTE — Assessment & Plan Note (Signed)
Blood pressure looks good today.  He is on carvedilol currently listed as 12.5 mg twice daily.  Goal blood pressure is actually closer to 130/80.  Well within target range.

## 2017-06-01 NOTE — Assessment & Plan Note (Signed)
Doing well.  No further anginal symptoms. At this point we can stop aspirin discontinued until end of January.  We will then switch over to Plavix. He is on stable dose of carvedilol and rosuvastatin.

## 2017-06-01 NOTE — Assessment & Plan Note (Signed)
Overall has been better controlled on Crestor 20 mg daily.  Current labs show LDL well within goal.

## 2017-06-09 ENCOUNTER — Ambulatory Visit
Admission: RE | Admit: 2017-06-09 | Discharge: 2017-06-09 | Disposition: A | Discharge status: Home / Self Care | Payer: 59 | Source: Ambulatory Visit | Attending: Orthopedic Surgery | Admitting: Orthopedic Surgery

## 2017-06-09 DIAGNOSIS — M25512 Pain in left shoulder: Secondary | ICD-10-CM | POA: Diagnosis not present

## 2017-06-09 DIAGNOSIS — M67912 Unspecified disorder of synovium and tendon, left shoulder: Secondary | ICD-10-CM

## 2017-06-12 DIAGNOSIS — M67912 Unspecified disorder of synovium and tendon, left shoulder: Secondary | ICD-10-CM | POA: Diagnosis not present

## 2017-07-09 ENCOUNTER — Encounter (HOSPITAL_COMMUNITY): Payer: Self-pay | Admitting: Emergency Medicine

## 2017-07-09 ENCOUNTER — Ambulatory Visit (HOSPITAL_COMMUNITY)
Admission: EM | Admit: 2017-07-09 | Discharge: 2017-07-09 | Disposition: A | Discharge status: Home / Self Care | Payer: 59 | Attending: Family Medicine | Admitting: Family Medicine

## 2017-07-09 DIAGNOSIS — K219 Gastro-esophageal reflux disease without esophagitis: Secondary | ICD-10-CM | POA: Insufficient documentation

## 2017-07-09 DIAGNOSIS — E785 Hyperlipidemia, unspecified: Secondary | ICD-10-CM | POA: Insufficient documentation

## 2017-07-09 DIAGNOSIS — Z87891 Personal history of nicotine dependence: Secondary | ICD-10-CM | POA: Insufficient documentation

## 2017-07-09 DIAGNOSIS — I252 Old myocardial infarction: Secondary | ICD-10-CM | POA: Insufficient documentation

## 2017-07-09 DIAGNOSIS — Z86718 Personal history of other venous thrombosis and embolism: Secondary | ICD-10-CM | POA: Diagnosis not present

## 2017-07-09 DIAGNOSIS — Z79899 Other long term (current) drug therapy: Secondary | ICD-10-CM | POA: Insufficient documentation

## 2017-07-09 DIAGNOSIS — I340 Nonrheumatic mitral (valve) insufficiency: Secondary | ICD-10-CM | POA: Insufficient documentation

## 2017-07-09 DIAGNOSIS — I10 Essential (primary) hypertension: Secondary | ICD-10-CM | POA: Insufficient documentation

## 2017-07-09 DIAGNOSIS — I2510 Atherosclerotic heart disease of native coronary artery without angina pectoris: Secondary | ICD-10-CM | POA: Insufficient documentation

## 2017-07-09 DIAGNOSIS — J029 Acute pharyngitis, unspecified: Secondary | ICD-10-CM | POA: Diagnosis not present

## 2017-07-09 LAB — POCT RAPID STREP A: Streptococcus, Group A Screen (Direct): NEGATIVE

## 2017-07-09 MED ORDER — AMOXICILLIN 875 MG PO TABS: 875.0000 mg | ORAL_TABLET | Freq: Two times a day (BID) | ORAL | 0 refills | Status: DC

## 2017-07-09 NOTE — ED Triage Notes (Signed)
PT reports sore throat for 2 days.  

## 2017-07-09 NOTE — ED Provider Notes (Signed)
[place]   [id number] 07/09/17 Arrival Time: 56   SUBJECTIVE:  Terry Wright is a 61 y.o. male who presents to the urgent care with complaint of sore throat for 2 days.   No cough, fever, or ear ache.  Former IT trainer    Past Medical History:  Diagnosis Date  . CAD S/P percutaneous coronary angioplasty 08/04/2016   100% occluded proximal circumflex: PCI with 4.0 x 16 mm Synergy DES. Also noted 65% ostial OM 3 (relatively small caliber with planned medical management). EF 3545% with lateral hypokinesis.  Marland Kitchen DVT (deep venous thrombosis) (HCC)    Right upper arm DVT on 2 occasions in the remote past  . Dyslipidemia    Patient mentions triglycerides specifically  . Ejection fraction    LV function normal, echo, February, 2010  . GERD (gastroesophageal reflux disease)   . Hyperlipidemia   . Hypertension   . Mitral regurgitation    Mild, echo, February, 2010  . Palpitations    Mild in the past  . Statin intolerance    Felt poorly after Lipitor, Crestor, TriCor  . STEMI involving left circumflex coronary artery ([place]) 08/04/2016   Occluded very large caliber codominant circumflex - PCI with single DES  . Tobacco abuse    Family History  Problem Relation Age of Onset  . Heart attack Mother        x2- lived into 58s  . Hypertension Father        lived into 83s  . Other Brother        killed in [place]   Social History   Socioeconomic History  . Marital status: Married    Spouse name: Not on file  . Number of children: Not on file  . Years of education: Not on file  . Highest education level: Not on file  Social Needs  . Financial resource strain: Not on file  . Food insecurity - worry: Not on file  . Food insecurity - inability: Not on file  . Transportation needs - medical: Not on file  . Transportation needs - non-medical: Not on file  Occupational History  . Not on file  Tobacco Use  . Smoking status: Former Smoker    Packs/day:  1.00    Years: 43.00    Pack years: 43.00    Last attempt to quit: 2015    Years since quitting: 3.9  . Smokeless tobacco: Never Used  . Tobacco comment: Former smoker quit 2015  Substance and Sexual Activity  . Alcohol use: Yes    Alcohol/week: 4.2 oz    Types: 7 Glasses of wine per week    Comment: occasional  . Drug use: No  . Sexual activity: Not on file  Other Topics Concern  . Not on file  Social History Narrative   Married 10 years in 2018. 2 step kids- 70 and 55. 3 kids but he does not have contact with. No grandkids      Retired Theatre stage manager.       Hobbies: golf (needs shoulder replacement though, working in the yard, working out- was doing this regularly even before MI- walking/treadmill   Current Meds  Medication Sig  . carvedilol (COREG) 12.5 MG tablet TAKE 1 TABLET (12.5 MG TOTAL) BY MOUTH 2 (TWO) TIMES DAILY WITH A MEAL.  Marland Kitchen LORazepam (ATIVAN) 0.5 MG tablet Take 1 tablet (0.5 mg total) by mouth 2 (two) times daily as needed for anxiety.  . pantoprazole (PROTONIX) 40 MG tablet  Take 1 tablet (40 mg total) by mouth daily.  . rosuvastatin (CRESTOR) 20 MG tablet Take 1 tablet (20 mg total) by mouth daily. (Patient taking differently: Take 20 mg by mouth every evening. )  . ticagrelor (BRILINTA) 90 MG TABS tablet Take 1 tablet (90 mg total) by mouth 2 (two) times daily.   No Known Allergies    ROS: As per HPI, remainder of ROS negative.   OBJECTIVE:   Vitals:   07/09/17 1139 07/09/17 1141  BP:  115/63  Pulse:  69  Resp:  16  Temp:  98.2 F (36.8 C)  TempSrc:  Oral  SpO2:  99%  Weight: 210 lb (95.3 kg)   Height: 6\' 1"  (1.854 m)      General appearance: alert; no distress Eyes: PERRL; EOMI; conjunctiva normal HENT: normocephalic; atraumatic; TMs normal, canal normal, external ears normal without trauma; nasal mucosa normal; oral mucosa normal Neck: supple Extremities: no cyanosis or edema; symmetrical with no gross deformities Skin: warm and  dry Neurologic: normal gait; grossly normal Psychological: alert and cooperative; normal mood and affect      Labs:  Results for orders placed or performed during the hospital encounter of 07/09/17  POCT rapid strep A [facility])  Result Value Ref Range   Streptococcus, Group A Screen (Direct) NEGATIVE NEGATIVE    Labs Reviewed  POCT RAPID STREP A    No results found.     ASSESSMENT & PLAN:  1. Pharyngitis, unspecified etiology     Meds ordered this encounter  Medications  . amoxicillin (AMOXIL) 875 MG tablet    Sig: Take 1 tablet (875 mg total) by mouth 2 (two) times daily.    Dispense:  20 tablet    Refill:  0    Reviewed expectations re: course of current medical issues. Questions answered. Outlined signs and symptoms indicating need for more acute intervention. Patient verbalized understanding. After Visit Summary given.    Procedures:      Robyn Haber, MD 07/09/17 1228

## 2017-07-09 NOTE — Discharge Instructions (Signed)
We are running a two day strep test the results of which we will call you or you can look up on line.  Rapid strep was negative.  Because the throat is so inflamed and swollen, I would like you to start the antibiotic until we know for sure with the secondary test that you do not have strep.

## 2017-07-12 LAB — CULTURE, GROUP A STREP (THRC)

## 2017-07-18 ENCOUNTER — Other Ambulatory Visit: Payer: Self-pay

## 2017-07-18 MED ORDER — PANTOPRAZOLE SODIUM 40 MG PO TBEC: 40.0000 mg | DELAYED_RELEASE_TABLET | Freq: Every day | ORAL | 11 refills | Status: DC

## 2017-08-15 DIAGNOSIS — M19012 Primary osteoarthritis, left shoulder: Secondary | ICD-10-CM | POA: Diagnosis not present

## 2017-08-21 ENCOUNTER — Telehealth: Payer: Self-pay | Admitting: Cardiology

## 2017-08-21 MED ORDER — TICAGRELOR 90 MG PO TABS: 90.0000 mg | ORAL_TABLET | Freq: Two times a day (BID) | ORAL | 1 refills | Status: DC

## 2017-08-21 NOTE — Telephone Encounter (Signed)
Rx(s) sent to pharmacy electronically.  

## 2017-08-21 NOTE — Telephone Encounter (Signed)
°*  STAT* If patient is at the pharmacy, call can be transferred to refill team.   1. Which medications need to be refilled? (please list name of each medication and dose if known) Brilinta   2. Which pharmacy/location (including street and city if local pharmacy) is medication to be sent to?[id number] - [place], [place] - [address]  3. Do they need a 30 day or 90 day supply? 30 day  Patient states that he needs enough to last til appt on 09-27-17, states that medication will be changed after that appt with Dr. Ellyn Hack

## 2017-08-29 ENCOUNTER — Other Ambulatory Visit: Payer: Self-pay

## 2017-08-29 ENCOUNTER — Telehealth: Payer: Self-pay | Admitting: Family Medicine

## 2017-08-29 NOTE — Telephone Encounter (Signed)
Please advise 

## 2017-08-29 NOTE — Telephone Encounter (Signed)
LORazepam refill Last OV:  05/07/17 Last Refill: 05/07/17 Pharmacy:  [id number]   [place], [place]

## 2017-08-29 NOTE — Telephone Encounter (Signed)
Copied from [place]. Topic: Quick Communication - Rx Refill/Question >> Aug 29, 2017 10:31 AM Ahmed Prima L wrote: Medication:  LORazepam (ATIVAN) 0.5 MG tablet    Has the patient contacted their pharmacy? yes   (Agent: If no, request that the patient contact the pharmacy for the refill.)   Preferred Pharmacy (with phone number or street name): [id number] - [place], [place]: Please be advised that RX refills may take up to 3 business days. We ask that you follow-up with your pharmacy.

## 2017-08-30 ENCOUNTER — Other Ambulatory Visit: Payer: Self-pay

## 2017-08-30 MED ORDER — LORAZEPAM 0.5 MG PO TABS: 0.5000 mg | ORAL_TABLET | Freq: Two times a day (BID) | ORAL | 2 refills | Status: DC | PRN

## 2017-08-30 NOTE — Telephone Encounter (Signed)
Medication refill request was sent to pharmacy

## 2017-09-01 ENCOUNTER — Encounter: Payer: Self-pay | Admitting: Family Medicine

## 2017-09-04 ENCOUNTER — Encounter: Payer: Self-pay | Admitting: Family Medicine

## 2017-09-04 ENCOUNTER — Ambulatory Visit: Payer: 59 | Admitting: Family Medicine

## 2017-09-04 VITALS — BP 98/66 | HR 63 | Temp 97.8°F | Ht 73.0 in | Wt 213.4 lb

## 2017-09-04 DIAGNOSIS — J432 Centrilobular emphysema: Secondary | ICD-10-CM | POA: Insufficient documentation

## 2017-09-04 DIAGNOSIS — I700 Atherosclerosis of aorta: Secondary | ICD-10-CM | POA: Diagnosis not present

## 2017-09-04 NOTE — Assessment & Plan Note (Signed)
S: Patient was over 40-pack-year smoking history but quit in 2015.  On his CT scan for lung cancer screening, he was noted to have emphysema.  He presents today to discuss his CT scan results.  Patient denies any history of wheezing.  He does have some chronic shortness of breath but that may very well be due to his Brilinta.  Denies history of wheeze with respiratory illnesses.  Denies chronic cough or sputum production A/P: Patient and I discussed that his symptoms will be more clear once he finishes his Brilinta.  It is possible he will not develop any symptoms from his emphysema on the other hand it is also possible that he may-we discussed this potential symptoms and appropriate follow-up.  We discussed potentially using an albuterol inhaler which he declines for now.

## 2017-09-04 NOTE — Assessment & Plan Note (Signed)
S: Aortic atherosclerosis also noted on CT scan A/P: We discussed that the risk factor modification for aortic atherosclerosis is really no different than not for his coronary artery disease.  Continue blood pressure, lipid control.

## 2017-09-04 NOTE — Progress Notes (Signed)
Subjective:  Terry Wright is a 62 y.o. year old very pleasant male patient who presents for/with See problem oriented charting ROS- no chest pain. Some shortness of breath on brilinta. No fever or chills. No chronic cough or sputum production. Denies wheezing.    Past Medical History-  Patient Active Problem List   Diagnosis Date Noted  . CAD S/P percutaneous coronary angioplasty 09/21/2016    Priority: High  . Panic attacks 09/21/2016    Priority: High  . History of DVT (deep vein thrombosis)     Priority: High  . Former smoker     Priority: High  . Centrilobular emphysema ([place]) 09/04/2017    Priority: Medium  . Hyperglycemia 09/21/2016    Priority: Medium  . Dyslipidemia, goal LDL below 70     Priority: Medium  . Essential hypertension     Priority: Medium  . Statin intolerance     Priority: Medium  . Mitral regurgitation     Priority: Low  . GERD (gastroesophageal reflux disease)     Priority: Low  . Ventricular bigeminy     Priority: Low  . Aortic atherosclerosis ([place]) 09/04/2017  . Acute medial meniscus tear of right knee 04/04/2017  . 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 Ischemic cardiomyopathy 10/12/2016    Medications- reviewed and updated Current Outpatient Medications  Medication Sig Dispense Refill  . carvedilol (COREG) 12.5 MG tablet TAKE 1 TABLET (12.5 MG TOTAL) BY MOUTH 2 (TWO) TIMES DAILY WITH A MEAL. 60 tablet 5  . LORazepam (ATIVAN) 0.5 MG tablet Take 1 tablet (0.5 mg total) by mouth 2 (two) times daily as needed for anxiety. 40 tablet 2  . pantoprazole (PROTONIX) 40 MG tablet Take 1 tablet (40 mg total) by mouth daily. 30 tablet 11  . ticagrelor (BRILINTA) 90 MG TABS tablet Take 1 tablet (90 mg total) by mouth 2 (two) times daily. 60 tablet 1  . rosuvastatin (CRESTOR) 20 MG tablet Take 1 tablet (20 mg total) by mouth daily. (Patient taking differently: Take 20 mg by mouth every evening. ) 90 tablet 3   No current facility-administered  medications for this visit.     Objective: BP 98/66 (BP Location: Left Arm, Patient Position: Sitting, Cuff Size: Large)   Pulse 63   Temp 97.8 F (36.6 C) (Oral)   Ht 6\' 1"  (1.854 m)   Wt 213 lb 6.4 oz (96.8 kg)   SpO2 96%   BMI 28.15 kg/m  Gen: NAD, resting comfortably Lungs: nonlabored Skin: warm, dry, 2 small papules to right of thoracic spine  Assessment/Plan:  Also has 2 mild itchy red spots on back- unclear etiology- offered steroid trial or dermatology- he declines both.   Centrilobular emphysema ([place]) S: Patient was over 40-pack-year smoking history but quit in 2015.  On his CT scan for lung cancer screening, he was noted to have emphysema.  He presents today to discuss his CT scan results.  Patient denies any history of wheezing.  He does have some chronic shortness of breath but that may very well be due to his Brilinta.  Denies history of wheeze with respiratory illnesses.  Denies chronic cough or sputum production A/P: Patient and I discussed that his symptoms will be more clear once he finishes his Brilinta.  It is possible he will not develop any symptoms from his emphysema on the other hand it is also possible that he may-we discussed this potential symptoms and appropriate follow-up.  We discussed potentially using an albuterol inhaler which  he declines for now.   Aortic atherosclerosis (HCC) S: Aortic atherosclerosis also noted on CT scan A/P: We discussed that the risk factor modification for aortic atherosclerosis is really no different than not for his coronary artery disease.  Continue blood pressure, lipid control.   Future Appointments  Date Time Provider [place]  09/27/2017 10:40 AM Leonie Man, MD [facility]   The duration of face-to-face time during this visit was greater than 15 minutes. Greater than 50% of this time was spent in counseling, explanation of diagnosis, planning of further management, and/or coordination of care  including discussion of meaning of emphysema and potential long-term symptoms, discussion of risk factor modification for aortic atherosclerosis as well as explaining terminology  Return precautions advised.  Garret Reddish, MD

## 2017-09-04 NOTE — Patient Instructions (Signed)
So scan did show some changes of emphysema- luckily it doesn't appear you have symptoms yet- although that will be clearer after brilinta is off.   Please let us know if you have wheezing, shortness of breath- we could consider an inhaler like albuterol to help. My hope is that the lung changes never reflect changes in symptoms though.

## 2017-09-17 DIAGNOSIS — Z23 Encounter for immunization: Secondary | ICD-10-CM | POA: Diagnosis not present

## 2017-09-27 ENCOUNTER — Ambulatory Visit: Payer: 59 | Admitting: Cardiology

## 2017-09-27 ENCOUNTER — Encounter: Payer: Self-pay | Admitting: Cardiology

## 2017-09-27 VITALS — BP 116/73 | HR 56 | Ht 73.0 in | Wt 214.0 lb

## 2017-09-27 DIAGNOSIS — Z9861 Coronary angioplasty status: Secondary | ICD-10-CM

## 2017-09-27 DIAGNOSIS — I340 Nonrheumatic mitral (valve) insufficiency: Secondary | ICD-10-CM | POA: Diagnosis not present

## 2017-09-27 DIAGNOSIS — I10 Essential (primary) hypertension: Secondary | ICD-10-CM | POA: Diagnosis not present

## 2017-09-27 DIAGNOSIS — I2510 Atherosclerotic heart disease of native coronary artery without angina pectoris: Secondary | ICD-10-CM | POA: Diagnosis not present

## 2017-09-27 DIAGNOSIS — Z789 Other specified health status: Secondary | ICD-10-CM

## 2017-09-27 DIAGNOSIS — I2121 ST elevation (STEMI) myocardial infarction involving left circumflex coronary artery: Secondary | ICD-10-CM | POA: Diagnosis not present

## 2017-09-27 DIAGNOSIS — E785 Hyperlipidemia, unspecified: Secondary | ICD-10-CM | POA: Diagnosis not present

## 2017-09-27 DIAGNOSIS — I255 Ischemic cardiomyopathy: Secondary | ICD-10-CM

## 2017-09-27 MED ORDER — CARVEDILOL 6.25 MG PO TABS: ORAL_TABLET | ORAL | 3 refills | Status: DC

## 2017-09-27 MED ORDER — CLOPIDOGREL BISULFATE 75 MG PO TABS: 75.0000 mg | ORAL_TABLET | Freq: Every day | ORAL | 3 refills | Status: DC

## 2017-09-27 MED ORDER — PANTOPRAZOLE SODIUM 40 MG PO TBEC: 40.0000 mg | DELAYED_RELEASE_TABLET | Freq: Every day | ORAL | 3 refills | Status: DC
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
## 2017-09-27 NOTE — Patient Instructions (Signed)
MEDICATION INSTRUCTION   DECREASE CARVEDILOL TO 12.5 MG IN THE MORNING AND 6.25 MG IN THE EVENING   STOP BRILINTA AFTER NIGHT , THEN START TOMORROW 4 TABLETS OF 75 MG CLOPIDOGREL , NEXT DAY ONE TABLET DAILY    LABS IN 6 MONTHS WILL MAIL YOU LABSLIP THAT TIME CMP LIPIDS    Your physician wants you to follow-up in [place]. You will receive a reminder letter in the mail two months in advance. If you don't receive a letter, please call our office to schedule the follow-up appointment.   If you need a refill on your cardiac medications before your next appointment, please call your pharmacy.

## 2017-09-29 ENCOUNTER — Encounter: Payer: Self-pay | Admitting: Cardiology

## 2017-09-29 NOTE — Assessment & Plan Note (Addendum)
Labs were just checked in October.  LDL is wonderfully a goal of 46.  Target really is below 50 for post MI patients based on new data.  I would continue with current dose of Crestor/rosuvastatin unless he starts having symptoms of intolerance.

## 2017-09-29 NOTE — Assessment & Plan Note (Signed)
Inferolateral STEMI on August 04, 2016 -DES PCI to a large circumflex -recovering quite well with no further anginal symptoms.  No heart failure symptoms. Plan: Convert from Brilinta to Plavix 75 mg.  He has been dealing with his dyspnea from Brilinta for a while now and is ready to convert. He is having a little bit of fatigue issues up in a back off his morning dose of carvedilol to 6.25 mg and continue the evening dose. Continue Crestor. Blood pressure is not yet adequate to titrate medicines since then backing off on carvedilol.  Therefore no ACE inhibitor or ARB.

## 2017-09-29 NOTE — Assessment & Plan Note (Signed)
Was likely ischemic MR in the setting with the MI.  No longer noted on follow-up echocardiogram.

## 2017-09-29 NOTE — Assessment & Plan Note (Signed)
He is tolerating Crestor.

## 2017-09-29 NOTE — Assessment & Plan Note (Signed)
With his blood pressure being a little low today and having some daytime fatigue, but back off his morning dose of carvedilol back to 6.25 and continue nighttime dose of 12.5.

## 2017-09-29 NOTE — Assessment & Plan Note (Signed)
Also resolved following revascularization.  No ongoing heart failure symptoms. No blood pressure room to add ARB to his existing beta-blocker.

## 2017-09-29 NOTE — Assessment & Plan Note (Signed)
Again sizable infarct with occluded circumflex, gated by cardiac arrest pre-cath.  Doing quite well now.  Follow-up echo shows almost full recovery of EF to a low normal range.  No significant regional wall motion normalities.  He is recovering well clinically.  Still having some mild psychological issues associated with it. He still has as needed lorazepam which he uses just about every night to help him sleep.  I will therefore keep his beta-blocker dose high at night, but can reduce a.m. dose.

## 2017-10-05 ENCOUNTER — Other Ambulatory Visit: Payer: Self-pay | Admitting: Cardiology

## 2017-10-05 NOTE — Telephone Encounter (Signed)
REFILL 

## 2017-10-15 ENCOUNTER — Telehealth: Payer: Self-pay | Admitting: Cardiology

## 2017-10-15 MED ORDER — CARVEDILOL 6.25 MG PO TABS: ORAL_TABLET | ORAL | 3 refills | Status: DC

## 2017-10-15 NOTE — Telephone Encounter (Signed)
Follow Up:    Please call,needs clarification on his Coreg directions.

## 2017-10-15 NOTE — Telephone Encounter (Signed)
Returned call to [facility], state they got rx stating Coreg 12.5mg  BID and patient states he is taking 12.5 mg AM and 6.25 mg PM.    Advised per last OV he should be taking Coreg 12.5 mg AM and 6.25 mg PM.   Rx changed and med list updated.

## 2017-10-24 ENCOUNTER — Other Ambulatory Visit: Payer: Self-pay | Admitting: Cardiology

## 2017-10-24 NOTE — Telephone Encounter (Signed)
°*  STAT* If patient is at the pharmacy, call can be transferred to refill team.   1. Which medications need to be refilled? (please list name of each medication and dose if known)  carvedilol (COREG) 6.25 MG tablet Take 2 tablets (12.5 mg total) by mouth every morning AND 1 tablet (6.25 mg total) every evening.    clopidogrel (PLAVIX) 75 MG tablet Take 1 tablet (75 mg total) by mouth daily.   LORazepam (ATIVAN) 0.5 MG tablet Take 1 tablet (0.5 mg total) by mouth 2 (two) times daily as needed for anxiety.   pantoprazole (PROTONIX) 40 MG tablet Take 1 tablet (40 mg total) by mouth daily.   rosuvastatin (CRESTOR) 20 MG tablet(Expired) Take 1 tablet (20 mg total) by mouth daily. Patient taking differently: Take 20 mg by mouth every evening.        2. Which pharmacy/location (including street and city if local pharmacy) is medication to be sent to? [facility]. Do they need a 30 day or 90 day supply? [place]

## 2017-10-25 MED ORDER — CARVEDILOL 6.25 MG PO TABS: ORAL_TABLET | ORAL | 1 refills | Status: DC

## 2017-10-25 MED ORDER — CLOPIDOGREL BISULFATE 75 MG PO TABS: 75.0000 mg | ORAL_TABLET | Freq: Every day | ORAL | 1 refills | Status: DC

## 2017-10-25 MED ORDER — ROSUVASTATIN CALCIUM 20 MG PO TABS: 20.0000 mg | ORAL_TABLET | Freq: Every day | ORAL | 1 refills | Status: DC

## 2017-10-26 ENCOUNTER — Other Ambulatory Visit: Payer: Self-pay

## 2017-10-30 ENCOUNTER — Other Ambulatory Visit: Payer: Self-pay | Admitting: *Deleted

## 2017-10-30 ENCOUNTER — Other Ambulatory Visit: Payer: Self-pay

## 2017-10-30 MED ORDER — LORAZEPAM 0.5 MG PO TABS: 0.5000 mg | ORAL_TABLET | Freq: Two times a day (BID) | ORAL | 1 refills | Status: DC | PRN

## 2017-10-30 MED ORDER — PANTOPRAZOLE SODIUM 40 MG PO TBEC: 40.0000 mg | DELAYED_RELEASE_TABLET | Freq: Every day | ORAL | 3 refills | Status: DC

## 2017-10-30 MED ORDER — CARVEDILOL 6.25 MG PO TABS: ORAL_TABLET | ORAL | 1 refills | Status: DC

## 2017-10-30 MED ORDER — ROSUVASTATIN CALCIUM 20 MG PO TABS: 20.0000 mg | ORAL_TABLET | Freq: Every day | ORAL | 1 refills | Status: DC

## 2017-10-30 MED ORDER — CLOPIDOGREL BISULFATE 75 MG PO TABS
75.0000 mg | ORAL_TABLET | Freq: Every day | ORAL | 1 refills | Status: DC
Start: 2017-10-30 — End: 2018-03-16

## 2017-11-07 DIAGNOSIS — M19212 Secondary osteoarthritis, left shoulder: Secondary | ICD-10-CM | POA: Diagnosis not present

## 2017-11-12 DIAGNOSIS — H2513 Age-related nuclear cataract, bilateral: Secondary | ICD-10-CM | POA: Diagnosis not present

## 2017-11-12 DIAGNOSIS — H02831 Dermatochalasis of right upper eyelid: Secondary | ICD-10-CM | POA: Diagnosis not present

## 2017-11-12 DIAGNOSIS — H02834 Dermatochalasis of left upper eyelid: Secondary | ICD-10-CM | POA: Diagnosis not present

## 2017-12-05 DIAGNOSIS — L570 Actinic keratosis: Secondary | ICD-10-CM | POA: Diagnosis not present

## 2017-12-05 DIAGNOSIS — L568 Other specified acute skin changes due to ultraviolet radiation: Secondary | ICD-10-CM | POA: Diagnosis not present

## 2017-12-05 DIAGNOSIS — L820 Inflamed seborrheic keratosis: Secondary | ICD-10-CM | POA: Diagnosis not present

## 2017-12-23 DIAGNOSIS — Z23 Encounter for immunization: Secondary | ICD-10-CM | POA: Diagnosis not present

## 2018-03-16 ENCOUNTER — Other Ambulatory Visit: Payer: Self-pay | Admitting: Cardiology

## 2018-03-18 ENCOUNTER — Telehealth: Payer: Self-pay | Admitting: *Deleted

## 2018-03-18 ENCOUNTER — Other Ambulatory Visit: Payer: Self-pay | Admitting: *Deleted

## 2018-03-18 DIAGNOSIS — I255 Ischemic cardiomyopathy: Secondary | ICD-10-CM

## 2018-03-18 DIAGNOSIS — E785 Hyperlipidemia, unspecified: Secondary | ICD-10-CM

## 2018-03-18 NOTE — Telephone Encounter (Signed)
MAILED LETTER AND LABSLIP DUE SEPT 2019

## 2018-03-18 NOTE — Telephone Encounter (Signed)
-----   Message from Raiford Simmonds, RN sent at 09/27/2017  1:55 PM EST ----- Labs due 03/27/18 Mail@ 02/24/18 lipid ,cmp

## 2018-03-25 ENCOUNTER — Emergency Department (HOSPITAL_COMMUNITY): Payer: 59

## 2018-03-25 ENCOUNTER — Inpatient Hospital Stay (HOSPITAL_COMMUNITY)
Admission: EM | Admit: 2018-03-25 | Discharge: 2018-03-28 | DRG: 247 | Disposition: A | Discharge status: Home / Self Care | Payer: 59 | Attending: Cardiology | Admitting: Cardiology

## 2018-03-25 ENCOUNTER — Encounter (HOSPITAL_COMMUNITY): Payer: Self-pay | Admitting: Emergency Medicine

## 2018-03-25 ENCOUNTER — Other Ambulatory Visit: Payer: Self-pay

## 2018-03-25 DIAGNOSIS — I25110 Atherosclerotic heart disease of native coronary artery with unstable angina pectoris: Secondary | ICD-10-CM | POA: Diagnosis not present

## 2018-03-25 DIAGNOSIS — Z86718 Personal history of other venous thrombosis and embolism: Secondary | ICD-10-CM

## 2018-03-25 DIAGNOSIS — Z7902 Long term (current) use of antithrombotics/antiplatelets: Secondary | ICD-10-CM

## 2018-03-25 DIAGNOSIS — E876 Hypokalemia: Secondary | ICD-10-CM | POA: Diagnosis present

## 2018-03-25 DIAGNOSIS — I340 Nonrheumatic mitral (valve) insufficiency: Secondary | ICD-10-CM | POA: Diagnosis present

## 2018-03-25 DIAGNOSIS — Z8249 Family history of ischemic heart disease and other diseases of the circulatory system: Secondary | ICD-10-CM

## 2018-03-25 DIAGNOSIS — Z87891 Personal history of nicotine dependence: Secondary | ICD-10-CM

## 2018-03-25 DIAGNOSIS — R0602 Shortness of breath: Secondary | ICD-10-CM | POA: Diagnosis not present

## 2018-03-25 DIAGNOSIS — I472 Ventricular tachycardia: Secondary | ICD-10-CM | POA: Diagnosis not present

## 2018-03-25 DIAGNOSIS — I499 Cardiac arrhythmia, unspecified: Secondary | ICD-10-CM

## 2018-03-25 DIAGNOSIS — J432 Centrilobular emphysema: Secondary | ICD-10-CM | POA: Diagnosis present

## 2018-03-25 DIAGNOSIS — K219 Gastro-esophageal reflux disease without esophagitis: Secondary | ICD-10-CM | POA: Diagnosis present

## 2018-03-25 DIAGNOSIS — R079 Chest pain, unspecified: Secondary | ICD-10-CM | POA: Diagnosis present

## 2018-03-25 DIAGNOSIS — E785 Hyperlipidemia, unspecified: Secondary | ICD-10-CM | POA: Diagnosis present

## 2018-03-25 DIAGNOSIS — I255 Ischemic cardiomyopathy: Secondary | ICD-10-CM | POA: Diagnosis present

## 2018-03-25 DIAGNOSIS — I493 Ventricular premature depolarization: Secondary | ICD-10-CM | POA: Diagnosis not present

## 2018-03-25 DIAGNOSIS — R0789 Other chest pain: Secondary | ICD-10-CM | POA: Diagnosis not present

## 2018-03-25 DIAGNOSIS — I700 Atherosclerosis of aorta: Secondary | ICD-10-CM | POA: Diagnosis present

## 2018-03-25 DIAGNOSIS — Z955 Presence of coronary angioplasty implant and graft: Secondary | ICD-10-CM

## 2018-03-25 DIAGNOSIS — Z79899 Other long term (current) drug therapy: Secondary | ICD-10-CM

## 2018-03-25 DIAGNOSIS — I10 Essential (primary) hypertension: Secondary | ICD-10-CM | POA: Diagnosis present

## 2018-03-25 DIAGNOSIS — I252 Old myocardial infarction: Secondary | ICD-10-CM

## 2018-03-25 DIAGNOSIS — I498 Other specified cardiac arrhythmias: Secondary | ICD-10-CM | POA: Diagnosis present

## 2018-03-25 DIAGNOSIS — Z8674 Personal history of sudden cardiac arrest: Secondary | ICD-10-CM

## 2018-03-25 DIAGNOSIS — I200 Unstable angina: Secondary | ICD-10-CM

## 2018-03-25 LAB — CBC
HCT: 48.2 % (ref 39.0–52.0)
Hemoglobin: 16.3 g/dL (ref 13.0–17.0)
MCH: 30.9 pg (ref 26.0–34.0)
MCHC: 33.8 g/dL (ref 30.0–36.0)
MCV: 91.5 fL (ref 78.0–100.0)
Platelets: 144 10*3/uL — ABNORMAL LOW (ref 150–400)
RBC: 5.27 MIL/uL (ref 4.22–5.81)
RDW: 12.9 % (ref 11.5–15.5)
WBC: 6.2 10*3/uL (ref 4.0–10.5)

## 2018-03-25 LAB — BASIC METABOLIC PANEL
Anion gap: 10 (ref 5–15)
BUN: 12 mg/dL (ref 8–23)
CO2: 22 mmol/L (ref 22–32)
Calcium: 9.2 mg/dL (ref 8.9–10.3)
Chloride: 106 mmol/L (ref 98–111)
Creatinine, Ser: 0.94 mg/dL (ref 0.61–1.24)
GFR calc Af Amer: >60 mL/min (ref >60)
GFR calc non Af Amer: >60 mL/min (ref >60)
Glucose, Bld: 147 mg/dL — ABNORMAL HIGH (ref 70–99)
Potassium: 3.7 mmol/L (ref 3.5–5.1)
Sodium: 138 mmol/L (ref 135–145)

## 2018-03-25 LAB — I-STAT TROPONIN, ED: Troponin i, poc: 0 ng/mL (ref 0.00–0.08)

## 2018-03-25 LAB — D-DIMER, QUANTITATIVE (NOT AT ARMC): D-Dimer, Quant: 0.92 ug/mL-FEU — ABNORMAL HIGH (ref 0.00–0.50)

## 2018-03-25 LAB — TROPONIN I: Troponin I: <0.03 ng/mL (ref <0.03)

## 2018-03-25 MED ORDER — ASPIRIN 81 MG PO CHEW
324.0000 mg | CHEWABLE_TABLET | Freq: Once | ORAL | Status: AC
  Administered 2018-03-25: 324 mg via ORAL
  Filled 2018-03-25: qty 4

## 2018-03-25 MED ORDER — CLOPIDOGREL BISULFATE 75 MG PO TABS
75.0000 mg | ORAL_TABLET | Freq: Every day | ORAL | Status: DC
  Administered 2018-03-26 – 2018-03-28 (×3): 75 mg via ORAL
  Filled 2018-03-25 (×4): qty 1

## 2018-03-25 MED ORDER — CARVEDILOL 3.125 MG PO TABS
6.2500 mg | ORAL_TABLET | Freq: Two times a day (BID) | ORAL | Status: DC
  Administered 2018-03-26 – 2018-03-28 (×4): 6.25 mg via ORAL
  Filled 2018-03-25 (×2): qty 2
  Filled 2018-03-25 (×3): qty 1

## 2018-03-25 MED ORDER — NITROGLYCERIN 0.4 MG SL SUBL
SUBLINGUAL_TABLET | SUBLINGUAL | Status: AC
  Filled 2018-03-25: qty 1

## 2018-03-25 MED ORDER — IOPAMIDOL (ISOVUE-370) INJECTION 76%
INTRAVENOUS | Status: AC
  Filled 2018-03-25: qty 100

## 2018-03-25 MED ORDER — ACETAMINOPHEN 325 MG PO TABS: 650.0000 mg | ORAL_TABLET | Freq: Four times a day (QID) | ORAL | Status: DC | PRN

## 2018-03-25 MED ORDER — PANTOPRAZOLE SODIUM 40 MG PO TBEC
40.0000 mg | DELAYED_RELEASE_TABLET | Freq: Every day | ORAL | Status: DC
  Administered 2018-03-26 – 2018-03-28 (×3): 40 mg via ORAL
  Filled 2018-03-25 (×4): qty 1

## 2018-03-25 MED ORDER — ONDANSETRON HCL 4 MG/2ML IJ SOLN: 4.0000 mg | Freq: Four times a day (QID) | INTRAMUSCULAR | Status: DC | PRN

## 2018-03-25 MED ORDER — NITROGLYCERIN 0.4 MG SL SUBL
0.4000 mg | SUBLINGUAL_TABLET | SUBLINGUAL | Status: DC | PRN
  Administered 2018-03-25: 0.4 mg via SUBLINGUAL

## 2018-03-25 MED ORDER — LORAZEPAM 0.5 MG PO TABS
0.5000 mg | ORAL_TABLET | Freq: Two times a day (BID) | ORAL | Status: DC | PRN
  Administered 2018-03-26 – 2018-03-27 (×3): 0.5 mg via ORAL
  Filled 2018-03-25 (×3): qty 1

## 2018-03-25 MED ORDER — ROSUVASTATIN CALCIUM 20 MG PO TABS
20.0000 mg | ORAL_TABLET | Freq: Every day | ORAL | Status: DC
  Administered 2018-03-26 – 2018-03-27 (×2): 20 mg via ORAL
  Filled 2018-03-25 (×2): qty 1

## 2018-03-25 MED ORDER — IOPAMIDOL (ISOVUE-370) INJECTION 76%
100.0000 mL | Freq: Once | INTRAVENOUS | Status: AC | PRN
  Administered 2018-03-25: 100 mL via INTRAVENOUS

## 2018-03-25 NOTE — ED Triage Notes (Signed)
Pt presents with intermittent sub-sternal chest pain x 1 day. Reports some shortness of breath, c/o diarrhea yesterday, denies nausea/vomiting. Cardiac hx.

## 2018-03-25 NOTE — ED Provider Notes (Signed)
[facility] EMERGENCY DEPARTMENT Provider Note   CSN: [id number] Arrival date & time: 03/25/18  1556     History   Chief Complaint Chief Complaint  Patient presents with  . Chest Pain    HPI Terry Wright is a 62 y.o. male.  Patient is a 62 year old male with a history of coronary artery disease, hypertension, hyperlipidemia who presents with chest pain.  He states that over the last 2 days he has had some intermittent pain to the center of his chest.  Nonradiating.  He does have some associated shortness of breath and fatigue.  No diaphoresis.  No nausea or vomiting.  He does have a history of coronary artery disease status post STEMI in January 2018 with stent placement in the circumflex.  He is currently on Plavix.  He does not take aspirin.  He has not taken anything at home for the pain.  It is not worse with eating.  It is a little bit worse if he takes a deep breath but mostly feels more short of breath when he tries to inhale deeply.  No cough or chest congestion.  He does have a prior remote history of DVT in his upper extremity but is not currently on anticoagulants other than Plavix.     Past Medical History:  Diagnosis Date  . CAD S/P percutaneous coronary angioplasty 08/04/2016   100% occluded proximal circumflex: PCI with 4.0 x 16 mm Synergy DES. Also noted 65% ostial OM 3 (relatively small caliber with planned medical management). EF 3545% with lateral hypokinesis.  Marland Kitchen DVT (deep venous thrombosis) (HCC)    Right upper arm DVT on 2 occasions in the remote past  . Dyslipidemia    Patient mentions triglycerides specifically  . Ejection fraction    LV function normal, echo, February, 2010  . GERD (gastroesophageal reflux disease)   . Hyperlipidemia   . Hypertension   . Mitral regurgitation    Mild, echo, February, 2010  . Palpitations    Mild in the past  . Statin intolerance    Felt poorly after Lipitor, Crestor, TriCor  . STEMI  involving left circumflex coronary artery ([place]) 08/04/2016   Occluded very large caliber codominant circumflex - PCI with single DES  . Tobacco abuse     Patient Active Problem List   Diagnosis Date Noted  . Centrilobular emphysema ([place]) 09/04/2017  . Aortic atherosclerosis ([place]) 09/04/2017  . Acute medial meniscus tear of right knee 04/04/2017  . 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 Ischemic cardiomyopathy 10/12/2016  . CAD S/P percutaneous coronary angioplasty 09/21/2016  . Panic attacks 09/21/2016  . Hyperglycemia 09/21/2016  . STEMI involving left circumflex coronary artery ([place]) -complicated by cardiac arrest 08/04/2016  . Mitral regurgitation   . History of DVT (deep vein thrombosis)   . Dyslipidemia, goal LDL below 50   . GERD (gastroesophageal reflux disease)   . Essential hypertension   . Ventricular bigeminy   . Former smoker   . Statin intolerance     Past Surgical History:  Procedure Laterality Date  . CARDIAC CATHETERIZATION N/A 08/04/2016   Procedure: Left Heart Cath and Coronary Angiography;  Surgeon: Leonie Man, MD;  Location: [facility];  Service: Cardiovascular;  Laterality: N/A: 100% pCx --> PCI. residual 65% pOM3 (small).  ~EF 35-45%  with lateral HK.  Marland Kitchen CARDIAC CATHETERIZATION N/A 08/04/2016   Procedure: Coronary Stent Intervention;  Surgeon: Leonie Man, MD;  Location: [facility];  Service:  Cardiovascular;  Laterality: N/A: pCx 100%-0%: Synergy DES 4.0 x 16 (4.5 mm)  . CHONDROPLASTY Right 04/04/2017   Procedure: CHONDROPLASTY;  Surgeon: Dorna Leitz, MD;  Location: [facility];  Service: Orthopedics;  Laterality: Right;  . GANGLION CYST EXCISION     oct 2017  . HERNIA REPAIR     umbilical hernia 2017 oct  . KNEE ARTHROSCOPY Right 04/04/2017   Procedure: ARTHROSCOPY RIGHT KNEE, WITH MEIDAL FEMORAL CONDYLE PATELLAR MEDIAL FEMORAL JOINT PLICA EXCISION ;  Surgeon: Dorna Leitz, MD;  Location: [facility];  Service: Orthopedics;  Laterality: Right;    . TRANSTHORACIC ECHOCARDIOGRAM  08/05/2016   post STEMI: EF 40-45%. Mild concentric LVH. Severe HK of basal-mid inferolateral wall consistent with infarct in this distribution. GR 1 DD  . TRANSTHORACIC ECHOCARDIOGRAM  01/11/2017   EF 50-55%. GR 1 DD. Mild to moderate LA dilation.        Home Medications    Prior to Admission medications   Medication Sig Start Date End Date Taking? Authorizing Provider  acetaminophen (TYLENOL) 500 MG tablet Take 1,000 mg by mouth every 6 (six) hours as needed for headache (pain).   Yes [provider]  carvedilol (COREG) 6.25 MG tablet Take 2 tablets (12.5 mg total) by mouth every morning AND 1 tablet (6.25 mg total) every evening. 10/30/17  Yes Leonie Man, MD  clopidogrel (PLAVIX) 75 MG tablet TAKE 1 TABLET BY MOUTH  DAILY Patient taking differently: Take 75 mg by mouth daily.  03/18/18  Yes Leonie Man, MD  LORazepam (ATIVAN) 0.5 MG tablet Take 1 tablet (0.5 mg total) by mouth 2 (two) times daily as needed for anxiety. Patient taking differently: Take 0.5 mg by mouth 2 (two) times daily as needed for anxiety or sleep.  10/30/17  Yes Marin Olp, MD  pantoprazole (PROTONIX) 40 MG tablet Take 1 tablet (40 mg total) by mouth daily. 10/30/17  Yes Leonie Man, MD  rosuvastatin (CRESTOR) 20 MG tablet Take 1 tablet (20 mg total) by mouth daily. Patient taking differently: Take 20 mg by mouth daily with supper.  10/30/17 04/26/19 Yes Leonie Man, MD    Family History Family History  Problem Relation Age of Onset  . Heart attack Mother        x2- lived into 23s  . Hypertension Father        lived into 34s  . Other Brother        killed in [place]    Social History Social History   Tobacco Use  . Smoking status: Former Smoker    Packs/day: 1.00    Years: 43.00    Pack years: 43.00    Last attempt to quit: 2015    Years since quitting: 4.6  . Smokeless tobacco: Never Used  . Tobacco comment: Former smoker quit 2015   Substance Use Topics  . Alcohol use: Yes    Alcohol/week: 7.0 standard drinks    Types: 7 Glasses of wine per week    Comment: occasional  . Drug use: No     Allergies   Patient has no known allergies.   Review of Systems Review of Systems  Constitutional: Positive for fatigue. Negative for chills, diaphoresis and fever.  HENT: Negative for congestion, rhinorrhea and sneezing.   Eyes: Negative.   Respiratory: Positive for shortness of breath. Negative for cough and chest tightness.   Cardiovascular: Positive for chest pain. Negative for leg swelling.  Gastrointestinal: Negative for abdominal pain, blood in stool, diarrhea,  nausea and vomiting.  Genitourinary: Negative for difficulty urinating, flank pain, frequency and hematuria.  Musculoskeletal: Negative for arthralgias and back pain.  Skin: Negative for rash.  Neurological: Negative for dizziness, speech difficulty, weakness, numbness and headaches.     Physical Exam Updated Vital Signs BP 130/67   Pulse 74   Temp 99.2 F (37.3 C) (Oral)   Resp 20   Ht 6\' 1"  (1.854 m)   Wt 99.8 kg   SpO2 96%   BMI 29.03 kg/m   Physical Exam  Constitutional: He is oriented to person, place, and time. He appears well-developed and well-nourished.  HENT:  Head: Normocephalic and atraumatic.  Eyes: Pupils are equal, round, and reactive to light.  Neck: Normal range of motion. Neck supple.  Cardiovascular: Normal rate, regular rhythm and normal heart sounds.  Pulmonary/Chest: Effort normal and breath sounds normal. No respiratory distress. He has no wheezes. He has no rales. He exhibits no tenderness.  Abdominal: Soft. Bowel sounds are normal. There is no tenderness. There is no rebound and no guarding.  Musculoskeletal: Normal range of motion. He exhibits no edema.  Lymphadenopathy:    He has no cervical adenopathy.  Neurological: He is alert and oriented to person, place, and time.  Skin: Skin is warm and dry. No rash noted.   Psychiatric: He has a normal mood and affect.     ED Treatments / Results  Labs (all labs ordered are listed, but only abnormal results are displayed) Labs Reviewed  BASIC METABOLIC PANEL - Abnormal; Notable for the following components:      Result Value   Glucose, Bld 147 (*)    All other components within normal limits  CBC - Abnormal; Notable for the following components:   Platelets 144 (*)    All other components within normal limits  D-DIMER, QUANTITATIVE (NOT AT [facility]) - Abnormal; Notable for the following components:   D-Dimer, Quant 0.92 (*)    All other components within normal limits  TROPONIN I  I-STAT TROPONIN, ED    EKG EKG Interpretation  Date/Time:  Monday March 25 2018 19:15:31 EDT Ventricular Rate:  73 PR Interval:  206 QRS Duration: 102 QT Interval:  383 QTC Calculation: 422 R Axis:   60 Text Interpretation:  Sinus rhythm Prolonged PR interval Abnormal R-wave progression, early transition since last tracing no significant change Confirmed by Malvin Johns [phone]) on 03/25/2018 7:37:05 PM   Radiology Dg Chest 2 View  Result Date: 03/25/2018 CLINICAL DATA:  Midsternal chest pain with dull headache today. EXAM: CHEST - 2 VIEW COMPARISON:  05/30/2007 FINDINGS: The heart size and mediastinal contours are within normal limits. Both lungs are clear. Minimal left basilar atelectasis. The visualized skeletal structures are unremarkable. IMPRESSION: No active cardiopulmonary disease. Electronically Signed   By: Ashley Royalty M.D.   On: 03/25/2018 16:40   Ct Angio Chest Pe W/cm &/or Wo Cm  Result Date: 03/25/2018 CLINICAL DATA:  Substernal chest pain, shortness of breath. EXAM: CT ANGIOGRAPHY CHEST WITH CONTRAST TECHNIQUE: Multidetector CT imaging of the chest was performed using the standard protocol during bolus administration of intravenous contrast. Multiplanar CT image reconstructions and MIPs were obtained to evaluate the vascular anatomy. CONTRAST:  166mL  ISOVUE-370 IOPAMIDOL (ISOVUE-370) INJECTION 76% COMPARISON:  05/18/2017 FINDINGS: Cardiovascular: No filling defects in the pulmonary arteries to suggest pulmonary emboli. Heart is upper limits normal in size. Aorta is normal caliber. Scattered moderate coronary artery calcifications. Mediastinum/Nodes: No mediastinal, hilar, or axillary adenopathy. Lungs/Pleura: Lingular atelectasis or scarring.  No other confluent opacities or effusions. Upper Abdomen: Imaging into the upper abdomen shows no acute findings. Musculoskeletal: Chest wall soft tissues are unremarkable. No acute bony abnormality. Review of the MIP images confirms the above findings. IMPRESSION: No evidence of pulmonary embolus. Coronary artery disease. No acute cardiopulmonary disease. Electronically Signed   By: Rolm Baptise M.D.   On: 03/25/2018 19:54    Procedures Procedures (including critical care time)  Medications Ordered in ED Medications  iopamidol (ISOVUE-370) 76 % injection (100 mLs  Canceled Entry 03/25/18 1933)  nitroGLYCERIN (NITROSTAT) SL tablet 0.4 mg (0.4 mg Sublingual Given 03/25/18 1903)  iopamidol (ISOVUE-370) 76 % injection 100 mL (100 mLs Intravenous Contrast Given 03/25/18 1934)  aspirin chewable tablet 324 mg (324 mg Oral Given 03/25/18 2104)     Initial Impression / Assessment and Plan / ED Course  I have reviewed the triage vital signs and the nursing notes.  Pertinent labs & imaging results that were available during my care of the patient were reviewed by me and considered in my medical decision making (see chart for details).     Patient is a 62 year old male who presents with chest pain.  His troponin is negative.  His EKG does not show ischemic changes.  His d-dimer was slightly elevated and he had a CT Angie of his chest which showed no evidence of PE.  He was given 1 nitroglycerin with resolution pain although he became very flushed and did not feel well after the nitroglycerin.  His blood pressure  dropped and he was given a fluid bolus.  He improved after this.  He was given no further nitroglycerin and remained pain-free.  He was given aspirin.  He had a repeat EKG which does not show ischemic changes.  I spoke with the cardiology fellow who will admit the patient.  Final Clinical Impressions(s) / ED Diagnoses   Final diagnoses:  Chest pain, unspecified type    ED Discharge Orders    None       Malvin Johns, MD 03/25/18 2227

## 2018-03-25 NOTE — ED Provider Notes (Signed)
Patient placed in Quick Look pathway, seen and evaluated   Chief Complaint: CP  HPI:   62 y.o. male who presents for evaluation of intermittent chest pain that began today.  He reports that this morning, he has been having intermittent midsternal chest pain.  He describes it as a sharp pain he states that the pain comes and goes but cannot recall specific action that brings out the pain.  He states that the pain is not worse with exertion.  He did get slightly hot with the pain but denies any nausea/vomiting.  He states that when he has been home is worse with deep inspiration "almost like my lungs hurt."  He denies any difficulty breathing.  Patient reports that he does have a history of MI and states that this feels slightly different.  He took his Plavix today.  Patient reports he has a history of a DVT in the upper extremity approximately 19 years ago.  He does not recall what prompted that DVT to occur.  He denies any hormone use, recent immobilization, prior history of DVT/PE, recent surgery, leg swelling, or long travel.  ROS: Chest pain  Physical Exam:   Gen: No distress  Neuro: Awake and Alert  Skin: Warm    Focused Exam: RRR, CTAB, 2+ radial pulses bilaterally.   Initiation of care has begun. The patient has been counseled on the process, plan, and necessity for staying for the completion/evaluation, and the remainder of the medical screening examination    Volanda Napoleon, PA-C 03/25/18 1612    Malvin Johns, MD 03/25/18 1918

## 2018-03-25 NOTE — H&P (Signed)
Cardiology History & Physical    Patient ID: Terry Wright MRN: [id number], DOB: April 26, 1956 Date of Encounter: 03/25/2018, 11:06 PM Primary Physician: Marin Olp, MD  Chief Complaint: Chest pain   HPI: Terry Wright is a 62 y.o. male with history of coronary artery disease status post STEMI (07/2016, left circumflex, complicated by VT/VF arrest), hypertension, hyperlipidemia, who presents with chest pain.  The patient has been doing very well since his acute MI in January 2018.  He had not had any recurrence of chest pain until today, when he noted the onset of substernal chest discomfort but came on at rest.  He had waxing and waning episodes of chest discomfort that lasted 5 to 10 minutes at a time, but had no relationship to exertion.  There is no associated shortness of breath, nausea, vomiting, diaphoresis, palpitations, presyncope or syncope.  Given several episodes of chest discomfort, he presented to the emergency department for further evaluation.  In the emergency department, he was mildly hypertensive upon presentation, but the remainder of his vital signs were within normal limits.  His ECG showed sinus rhythm with PVCs, but no acute ischemic changes.  His labs were within normal limits, including 2 sets of troponins drawn approximately 6 hours apart.  His d-dimer was elevated, so he was sent for a CT PE protocol, which showed no evidence of pulmonary embolus.  He was then admitted to the cardiology service for further management.  Past Medical History:  Diagnosis Date  . CAD S/P percutaneous coronary angioplasty 08/04/2016   100% occluded proximal circumflex: PCI with 4.0 x 16 mm Synergy DES. Also noted 65% ostial OM 3 (relatively small caliber with planned medical management). EF 3545% with lateral hypokinesis.  Marland Kitchen DVT (deep venous thrombosis) (HCC)    Right upper arm DVT on 2 occasions in the remote past  . Dyslipidemia    Patient mentions triglycerides  specifically  . Ejection fraction    LV function normal, echo, February, 2010  . GERD (gastroesophageal reflux disease)   . Hyperlipidemia   . Hypertension   . Mitral regurgitation    Mild, echo, February, 2010  . Palpitations    Mild in the past  . Statin intolerance    Felt poorly after Lipitor, Crestor, TriCor  . STEMI involving left circumflex coronary artery ([place]) 08/04/2016   Occluded very large caliber codominant circumflex - PCI with single DES  . Tobacco abuse      Surgical History:  Past Surgical History:  Procedure Laterality Date  . CARDIAC CATHETERIZATION N/A 08/04/2016   Procedure: Left Heart Cath and Coronary Angiography;  Surgeon: Leonie Man, MD;  Location: [facility];  Service: Cardiovascular;  Laterality: N/A: 100% pCx --> PCI. residual 65% pOM3 (small).  ~EF 35-45%  with lateral HK.  Marland Kitchen CARDIAC CATHETERIZATION N/A 08/04/2016   Procedure: Coronary Stent Intervention;  Surgeon: Leonie Man, MD;  Location: [facility];  Service: Cardiovascular;  Laterality: N/A: pCx 100%-0%: Synergy DES 4.0 x 16 (4.5 mm)  . CHONDROPLASTY Right 04/04/2017   Procedure: CHONDROPLASTY;  Surgeon: Dorna Leitz, MD;  Location: [facility];  Service: Orthopedics;  Laterality: Right;  . GANGLION CYST EXCISION     oct 2017  . HERNIA REPAIR     umbilical hernia 2017 oct  . KNEE ARTHROSCOPY Right 04/04/2017   Procedure: ARTHROSCOPY RIGHT KNEE, WITH MEIDAL FEMORAL CONDYLE PATELLAR MEDIAL FEMORAL JOINT PLICA EXCISION ;  Surgeon: Dorna Leitz, MD;  Location: [facility];  Service: Orthopedics;  Laterality: Right;  . TRANSTHORACIC ECHOCARDIOGRAM  08/05/2016   post STEMI: EF 40-45%. Mild concentric LVH. Severe HK of basal-mid inferolateral wall consistent with infarct in this distribution. GR 1 DD  . TRANSTHORACIC ECHOCARDIOGRAM  01/11/2017   EF 50-55%. GR 1 DD. Mild to moderate LA dilation.     Home Meds: Prior to Admission medications   Medication Sig Start Date End Date Taking?  Authorizing Provider  acetaminophen (TYLENOL) 500 MG tablet Take 1,000 mg by mouth every 6 (six) hours as needed for headache (pain).   Yes [provider]  carvedilol (COREG) 6.25 MG tablet Take 2 tablets (12.5 mg total) by mouth every morning AND 1 tablet (6.25 mg total) every evening. 10/30/17  Yes Leonie Man, MD  clopidogrel (PLAVIX) 75 MG tablet TAKE 1 TABLET BY MOUTH  DAILY Patient taking differently: Take 75 mg by mouth daily.  03/18/18  Yes Leonie Man, MD  LORazepam (ATIVAN) 0.5 MG tablet Take 1 tablet (0.5 mg total) by mouth 2 (two) times daily as needed for anxiety. Patient taking differently: Take 0.5 mg by mouth 2 (two) times daily as needed for anxiety or sleep.  10/30/17  Yes Marin Olp, MD  pantoprazole (PROTONIX) 40 MG tablet Take 1 tablet (40 mg total) by mouth daily. 10/30/17  Yes Leonie Man, MD  rosuvastatin (CRESTOR) 20 MG tablet Take 1 tablet (20 mg total) by mouth daily. Patient taking differently: Take 20 mg by mouth daily with supper.  10/30/17 04/26/19 Yes Leonie Man, MD    Allergies: No Known Allergies  Social History   Socioeconomic History  . Marital status: Married    Spouse name: Not on file  . Number of children: Not on file  . Years of education: Not on file  . Highest education level: Not on file  Occupational History  . Not on file  Social Needs  . Financial resource strain: Not on file  . Food insecurity:    Worry: Not on file    Inability: Not on file  . Transportation needs:    Medical: Not on file    Non-medical: Not on file  Tobacco Use  . Smoking status: Former Smoker    Packs/day: 1.00    Years: 43.00    Pack years: 43.00    Last attempt to quit: 2015    Years since quitting: 4.6  . Smokeless tobacco: Never Used  . Tobacco comment: Former smoker quit 2015  Substance and Sexual Activity  . Alcohol use: Yes    Alcohol/week: 7.0 standard drinks    Types: 7 Glasses of wine per week    Comment: occasional    . Drug use: No  . Sexual activity: Not on file  Lifestyle  . Physical activity:    Days per week: Not on file    Minutes per session: Not on file  . Stress: Not on file  Relationships  . Social connections:    Talks on phone: Not on file    Gets together: Not on file    Attends religious service: Not on file    Active member of club or organization: Not on file    Attends meetings of clubs or organizations: Not on file    Relationship status: Not on file  . Intimate partner violence:    Fear of current or ex partner: Not on file    Emotionally abused: Not on file    Physically abused: Not on file  Forced sexual activity: Not on file  Other Topics Concern  . Not on file  Social History Narrative   Married 10 years in 2018. 2 step kids- 72 and 34. 3 kids but he does not have contact with. No grandkids      Retired Theatre stage manager.       Hobbies: golf (needs shoulder replacement though, working in the yard, working out- was doing this regularly even before MI- walking/treadmill     Family History  Problem Relation Age of Onset  . Heart attack Mother        x2- lived into 64s  . Hypertension Father        lived into 68s  . Other Brother        killed in [place]    Review of Systems: All other systems reviewed and are otherwise negative except as noted above.  Labs:   Lab Results  Component Value Date   WBC 6.2 03/25/2018   HGB 16.3 03/25/2018   HCT 48.2 03/25/2018   MCV 91.5 03/25/2018   PLT 144 (L) 03/25/2018    Recent Labs  Lab 03/25/18 1604  NA 138  K 3.7  CL 106  CO2 22  BUN 12  CREATININE 0.94  CALCIUM 9.2  GLUCOSE 147*   Recent Labs    03/25/18 2153  TROPONINI <0.03   Lab Results  Component Value Date   CHOL 110 05/09/2017   HDL 35 (L) 05/09/2017   LDLCALC 46 05/09/2017   TRIG 143 05/09/2017   Lab Results  Component Value Date   DDIMER 0.92 (H) 03/25/2018    Radiology/Studies:  Dg Chest 2 View  Result Date: 03/25/2018 CLINICAL  DATA:  Midsternal chest pain with dull headache today. EXAM: CHEST - 2 VIEW COMPARISON:  05/30/2007 FINDINGS: The heart size and mediastinal contours are within normal limits. Both lungs are clear. Minimal left basilar atelectasis. The visualized skeletal structures are unremarkable. IMPRESSION: No active cardiopulmonary disease. Electronically Signed   By: Ashley Royalty M.D.   On: 03/25/2018 16:40   Ct Angio Chest Pe W/cm &/or Wo Cm  Result Date: 03/25/2018 CLINICAL DATA:  Substernal chest pain, shortness of breath. EXAM: CT ANGIOGRAPHY CHEST WITH CONTRAST TECHNIQUE: Multidetector CT imaging of the chest was performed using the standard protocol during bolus administration of intravenous contrast. Multiplanar CT image reconstructions and MIPs were obtained to evaluate the vascular anatomy. CONTRAST:  16mL ISOVUE-370 IOPAMIDOL (ISOVUE-370) INJECTION 76% COMPARISON:  05/18/2017 FINDINGS: Cardiovascular: No filling defects in the pulmonary arteries to suggest pulmonary emboli. Heart is upper limits normal in size. Aorta is normal caliber. Scattered moderate coronary artery calcifications. Mediastinum/Nodes: No mediastinal, hilar, or axillary adenopathy. Lungs/Pleura: Lingular atelectasis or scarring. No other confluent opacities or effusions. Upper Abdomen: Imaging into the upper abdomen shows no acute findings. Musculoskeletal: Chest wall soft tissues are unremarkable. No acute bony abnormality. Review of the MIP images confirms the above findings. IMPRESSION: No evidence of pulmonary embolus. Coronary artery disease. No acute cardiopulmonary disease. Electronically Signed   By: Rolm Baptise M.D.   On: 03/25/2018 19:54   Wt Readings from Last 3 Encounters:  03/25/18 99.8 kg  09/27/17 97.1 kg  09/04/17 96.8 kg    EKG: Normal sinus rhythm, PVCs.  Physical Exam: Blood pressure (!) 111/51, pulse 75, temperature 99.2 F (37.3 C), temperature source Oral, resp. rate (!) 27, height 6\' 1"  (1.854 m), weight  99.8 kg, SpO2 95 %. Body mass index is 29.03 kg/m. General: Well developed, well nourished,  in no acute distress. Head: Normocephalic, atraumatic, sclera non-icteric, no xanthomas, nares are without discharge.  Neck: Negative for carotid bruits. JVD not elevated. Lungs: Clear bilaterally to auscultation without wheezes, rales, or rhonchi. Breathing is unlabored. Heart: RRR with S1 S2. No murmurs, rubs, or gallops appreciated. Abdomen: Soft, non-tender, non-distended with normoactive bowel sounds. No hepatomegaly. No rebound/guarding. No obvious abdominal masses. Msk:  Strength and tone appear normal for age. Extremities: No clubbing or cyanosis. No edema.  Distal pedal pulses are 2+ and equal bilaterally. Neuro: Alert and oriented X 3. No focal deficit. No facial asymmetry. Moves all extremities spontaneously. Psych:  Responds to questions appropriately with a normal affect.    Assessment and Plan  63 year old man with known coronary disease who presents with chest pain.  1.  Chest pain: Chest pain-free at present.  2 sets of troponins have been within normal limits.  His history has some typical and atypical features.  We will cycle a third set of troponins in the a.m.  We will plan to start heparin only if there is recurrent chest discomfort or positive biomarkers.  It would be reasonable to proceed to noninvasive testing (I.e. Nuclear MPI), presuming that he does not rule in.  Continue low-dose aspirin and home Plavix in the interim.  2.  Hypertension: Continue home carvedilol at current dose.  3.  Hyperlipidemia: Continue home Crestor at current dose.  Signed, Doylene Canning, MD 03/25/2018, 11:06 PM

## 2018-03-26 ENCOUNTER — Other Ambulatory Visit: Payer: Self-pay

## 2018-03-26 DIAGNOSIS — R079 Chest pain, unspecified: Secondary | ICD-10-CM

## 2018-03-26 DIAGNOSIS — I472 Ventricular tachycardia: Secondary | ICD-10-CM | POA: Diagnosis not present

## 2018-03-26 DIAGNOSIS — I25110 Atherosclerotic heart disease of native coronary artery with unstable angina pectoris: Secondary | ICD-10-CM | POA: Diagnosis not present

## 2018-03-26 DIAGNOSIS — I252 Old myocardial infarction: Secondary | ICD-10-CM | POA: Diagnosis not present

## 2018-03-26 LAB — TROPONIN I: Troponin I: <0.03 ng/mL (ref <0.03)

## 2018-03-26 LAB — HIV ANTIBODY (ROUTINE TESTING W REFLEX): HIV Screen 4th Generation wRfx: NONREACTIVE

## 2018-03-26 MED ORDER — SENNOSIDES-DOCUSATE SODIUM 8.6-50 MG PO TABS
1.0000 | ORAL_TABLET | Freq: Two times a day (BID) | ORAL | Status: DC | PRN
  Administered 2018-03-26: 1 via ORAL
  Filled 2018-03-26: qty 1

## 2018-03-26 MED ORDER — SODIUM CHLORIDE 0.9 % IV SOLN: 250.0000 mL | INTRAVENOUS | Status: DC | PRN

## 2018-03-26 MED ORDER — ASPIRIN 81 MG PO CHEW
81.0000 mg | CHEWABLE_TABLET | ORAL | Status: AC
  Administered 2018-03-27: 81 mg via ORAL
  Filled 2018-03-26: qty 1

## 2018-03-26 MED ORDER — SODIUM CHLORIDE 0.9 % WEIGHT BASED INFUSION
1.0000 mL/kg/h | INTRAVENOUS | Status: DC
  Administered 2018-03-27 (×2): 1 mL/kg/h via INTRAVENOUS

## 2018-03-26 MED ORDER — SODIUM CHLORIDE 0.9% FLUSH
3.0000 mL | Freq: Two times a day (BID) | INTRAVENOUS | Status: DC
  Administered 2018-03-26 – 2018-03-27 (×2): 3 mL via INTRAVENOUS

## 2018-03-26 MED ORDER — SODIUM CHLORIDE 0.9 % WEIGHT BASED INFUSION
3.0000 mL/kg/h | INTRAVENOUS | Status: DC
  Administered 2018-03-27: 3 mL/kg/h via INTRAVENOUS

## 2018-03-26 MED ORDER — SODIUM CHLORIDE 0.9% FLUSH: 3.0000 mL | INTRAVENOUS | Status: DC | PRN

## 2018-03-26 NOTE — Progress Notes (Signed)
Progress Note  Patient Name: Terry Wright Date of Encounter: 03/26/2018  Primary Cardiologist:Harding    Subjective   No chest pressure at present   No SOB  Occasional cough  Inpatient Medications    Scheduled Meds: . carvedilol  6.25 mg Oral BID WC  . clopidogrel  75 mg Oral Daily  . pantoprazole  40 mg Oral Daily  . rosuvastatin  20 mg Oral Q supper   Continuous Infusions:  PRN Meds: acetaminophen, LORazepam, nitroGLYCERIN, ondansetron (ZOFRAN) IV   Vital Signs    Vitals:   03/26/18 0511 03/26/18 0751 03/26/18 0830 03/26/18 1238  BP: (!) 110/46 113/68 (!) 91/53 103/79  Pulse: 70 71 72 65  Resp: 12 18 (!) 22 18  Temp:  98.5 F (36.9 C) 98.8 F (37.1 C) 98.2 F (36.8 C)  TempSrc:  Oral Oral Oral  SpO2: 96% 96% 96% 95%  Weight:      Height:        Intake/Output Summary (Last 24 hours) at 03/26/2018 1259 Last data filed at 03/26/2018 1127 Gross per 24 hour  Intake 120 ml  Output 200 ml  Net -80 ml   Filed Weights   03/25/18 1605 03/26/18 0045  Weight: 99.8 kg 94.8 kg    Telemetry    SR with PVCs  1 22 beat VT   APprox 150 bpme  - Personally Reviewed  ECG    Physical Exam   GEN: No acute distress.   Neck: No JVD Cardiac: RRR, no murmurs, rubs, or gallops.  Respiratory: Clear to auscultation bilaterally. GI: Soft, nontender, non-distended  MS: No edema; No deformity. Neuro:  Nonfocal  Psych: Normal affect   Labs    Chemistry Recent Labs  Lab 03/25/18 1604  NA 138  K 3.7  CL 106  CO2 22  GLUCOSE 147*  BUN 12  CREATININE 0.94  CALCIUM 9.2  GFRNONAA >60  GFRAA >60  ANIONGAP 10     Hematology Recent Labs  Lab 03/25/18 1604  WBC 6.2  RBC 5.27  HGB 16.3  HCT 48.2  MCV 91.5  MCH 30.9  MCHC 33.8  RDW 12.9  PLT 144*    Cardiac Enzymes Recent Labs  Lab 03/25/18 2153 03/26/18 0432  TROPONINI <0.03 <0.03    Recent Labs  Lab 03/25/18 1612  TROPIPOC 0.00     BNPNo results for input(s): BNP, PROBNP in the  last 168 hours.   DDimer  Recent Labs  Lab 03/25/18 1604  DDIMER 0.92*     Radiology    Dg Chest 2 View  Result Date: 03/25/2018 CLINICAL DATA:  Midsternal chest pain with dull headache today. EXAM: CHEST - 2 VIEW COMPARISON:  05/30/2007 FINDINGS: The heart size and mediastinal contours are within normal limits. Both lungs are clear. Minimal left basilar atelectasis. The visualized skeletal structures are unremarkable. IMPRESSION: No active cardiopulmonary disease. Electronically Signed   By: Ashley Royalty M.D.   On: 03/25/2018 16:40   Ct Angio Chest Pe W/cm &/or Wo Cm  Result Date: 03/25/2018 CLINICAL DATA:  Substernal chest pain, shortness of breath. EXAM: CT ANGIOGRAPHY CHEST WITH CONTRAST TECHNIQUE: Multidetector CT imaging of the chest was performed using the standard protocol during bolus administration of intravenous contrast. Multiplanar CT image reconstructions and MIPs were obtained to evaluate the vascular anatomy. CONTRAST:  15mL ISOVUE-370 IOPAMIDOL (ISOVUE-370) INJECTION 76% COMPARISON:  05/18/2017 FINDINGS: Cardiovascular: No filling defects in the pulmonary arteries to suggest pulmonary emboli. Heart is upper limits normal in size. Aorta  is normal caliber. Scattered moderate coronary artery calcifications. Mediastinum/Nodes: No mediastinal, hilar, or axillary adenopathy. Lungs/Pleura: Lingular atelectasis or scarring. No other confluent opacities or effusions. Upper Abdomen: Imaging into the upper abdomen shows no acute findings. Musculoskeletal: Chest wall soft tissues are unremarkable. No acute bony abnormality. Review of the MIP images confirms the above findings. IMPRESSION: No evidence of pulmonary embolus. Coronary artery disease. No acute cardiopulmonary disease. Electronically Signed   By: Rolm Baptise M.D.   On: 03/25/2018 19:54    Cardiac Studies     Patient Profile     62 y.o. male  hixtoyr of NSTEMI in 07/2016 with VT/VF arrest   Underwent intervention to LCx   Presented yesterday with new onset CP  Assessment & Plan    1  Chest pressure  Pt currently symptom free   Trop x 2 are neg  Symptoms are somewhat atypical but Tele showed 22 beats VT  Concerning for ischemia   With history and new symtpoms concern for ischemia   I have reviewed with D Ellyn Hack (primary cardiologist) who agrees   Would recomm L heart cath to define anatomy  Pt understands and agrees to proceed.   Continue telelmetry    2  HL  Continue statin  3  HTN   Follow  BP is a little low at times  PT denies dizziness  For questions or updates, please contact [facility] Please consult [email or web address] for contact info under Cardiology/STEMI.      Signed, Dorris Carnes, MD  03/26/2018, 12:59 PM

## 2018-03-26 NOTE — Progress Notes (Signed)
   03/26/18 0511  Vitals  BP (!) 110/46  MAP (mmHg) (!) 63  BP Location Left Arm  BP Method Automatic  Patient Position (if appropriate) Lying  Pulse Rate 70  Resp 12  Oxygen Therapy  SpO2 96 %  O2 Device Room Air  Dr Regis Bill paged to notify of 21 beat run of v tach. Patent is asymptomatic and reports no discomfort at this time. Awaiting callback from provider.Will continue to monitor patient.

## 2018-03-26 NOTE — Progress Notes (Signed)
To the best of my knowledge, documentation by Harriette Bouillon, [employer] nursing student, is correct.

## 2018-03-26 NOTE — Progress Notes (Signed)
Patient arrived to [facility] from ED. Patient alert and oriented. CCMD notified. Admission assessment complete. No complaints of pain at this time. Patient updated on plan of care.Will continue to monitor patient.

## 2018-03-27 ENCOUNTER — Other Ambulatory Visit: Payer: Self-pay | Admitting: Cardiology

## 2018-03-27 ENCOUNTER — Encounter (HOSPITAL_COMMUNITY)
Admission: EM | Disposition: A | Discharge status: Home / Self Care | Payer: Self-pay | Source: Home / Self Care | Attending: Cardiology

## 2018-03-27 ENCOUNTER — Encounter (HOSPITAL_COMMUNITY): Payer: Self-pay | Admitting: Cardiology

## 2018-03-27 DIAGNOSIS — I200 Unstable angina: Secondary | ICD-10-CM

## 2018-03-27 DIAGNOSIS — E876 Hypokalemia: Secondary | ICD-10-CM | POA: Diagnosis not present

## 2018-03-27 DIAGNOSIS — I209 Angina pectoris, unspecified: Secondary | ICD-10-CM | POA: Diagnosis not present

## 2018-03-27 DIAGNOSIS — I499 Cardiac arrhythmia, unspecified: Secondary | ICD-10-CM | POA: Diagnosis not present

## 2018-03-27 DIAGNOSIS — I472 Ventricular tachycardia: Secondary | ICD-10-CM | POA: Diagnosis not present

## 2018-03-27 DIAGNOSIS — I25110 Atherosclerotic heart disease of native coronary artery with unstable angina pectoris: Principal | ICD-10-CM

## 2018-03-27 DIAGNOSIS — I25119 Atherosclerotic heart disease of native coronary artery with unspecified angina pectoris: Secondary | ICD-10-CM | POA: Diagnosis not present

## 2018-03-27 HISTORY — PX: CORONARY STENT INTERVENTION: CATH118234

## 2018-03-27 HISTORY — PX: LEFT HEART CATH AND CORONARY ANGIOGRAPHY: CATH118249

## 2018-03-27 LAB — MAGNESIUM: Magnesium: 2 mg/dL (ref 1.7–2.4)

## 2018-03-27 LAB — BASIC METABOLIC PANEL
Anion gap: 8 (ref 5–15)
BUN: 13 mg/dL (ref 8–23)
CO2: 26 mmol/L (ref 22–32)
Calcium: 8.7 mg/dL — ABNORMAL LOW (ref 8.9–10.3)
Chloride: 107 mmol/L (ref 98–111)
Creatinine, Ser: 0.92 mg/dL (ref 0.61–1.24)
GFR calc Af Amer: >60 mL/min (ref >60)
GFR calc non Af Amer: >60 mL/min (ref >60)
Glucose, Bld: 90 mg/dL (ref 70–99)
Potassium: 3.5 mmol/L (ref 3.5–5.1)
Sodium: 141 mmol/L (ref 135–145)

## 2018-03-27 LAB — CBC
HCT: 45.6 % (ref 39.0–52.0)
Hemoglobin: 15 g/dL (ref 13.0–17.0)
MCH: 31 pg (ref 26.0–34.0)
MCHC: 32.9 g/dL (ref 30.0–36.0)
MCV: 94.2 fL (ref 78.0–100.0)
Platelets: 146 10*3/uL — ABNORMAL LOW (ref 150–400)
RBC: 4.84 MIL/uL (ref 4.22–5.81)
RDW: 13.1 % (ref 11.5–15.5)
WBC: 6.6 10*3/uL (ref 4.0–10.5)

## 2018-03-27 LAB — LIPID PANEL
Cholesterol: 106 mg/dL (ref 0–200)
HDL: 30 mg/dL — ABNORMAL LOW (ref >40)
LDL Cholesterol: 56 mg/dL (ref 0–99)
Total CHOL/HDL Ratio: 3.5 RATIO
Triglycerides: 99 mg/dL (ref <150)
VLDL: 20 mg/dL (ref 0–40)

## 2018-03-27 LAB — POCT ACTIVATED CLOTTING TIME
Activated Clotting Time: 230 seconds
Activated Clotting Time: 263 seconds

## 2018-03-27 SURGERY — LEFT HEART CATH AND CORONARY ANGIOGRAPHY
Anesthesia: LOCAL

## 2018-03-27 MED ORDER — IOPAMIDOL (ISOVUE-370) INJECTION 76%
INTRAVENOUS | Status: AC
  Filled 2018-03-27: qty 50

## 2018-03-27 MED ORDER — LIDOCAINE HCL (PF) 1 % IJ SOLN
INTRAMUSCULAR | Status: DC | PRN
  Administered 2018-03-27: 2 mL

## 2018-03-27 MED ORDER — FENTANYL CITRATE (PF) 100 MCG/2ML IJ SOLN
INTRAMUSCULAR | Status: DC | PRN
  Administered 2018-03-27 (×2): 25 ug via INTRAVENOUS

## 2018-03-27 MED ORDER — IOPAMIDOL (ISOVUE-370) INJECTION 76%
INTRAVENOUS | Status: DC | PRN
  Administered 2018-03-27: 155 mL via INTRA_ARTERIAL

## 2018-03-27 MED ORDER — NITROGLYCERIN 1 MG/10 ML FOR IR/CATH LAB
INTRA_ARTERIAL | Status: AC
  Filled 2018-03-27: qty 10

## 2018-03-27 MED ORDER — SODIUM CHLORIDE 0.9 % WEIGHT BASED INFUSION: 1.0000 mL/kg/h | INTRAVENOUS | Status: AC

## 2018-03-27 MED ORDER — HEPARIN (PORCINE) IN NACL 1000-0.9 UT/500ML-% IV SOLN
INTRAVENOUS | Status: DC | PRN
  Administered 2018-03-27: 500 mL

## 2018-03-27 MED ORDER — HEPARIN SODIUM (PORCINE) 1000 UNIT/ML IJ SOLN
INTRAMUSCULAR | Status: AC
  Filled 2018-03-27: qty 1

## 2018-03-27 MED ORDER — IOPAMIDOL (ISOVUE-370) INJECTION 76%
INTRAVENOUS | Status: AC
  Filled 2018-03-27: qty 100

## 2018-03-27 MED ORDER — MIDAZOLAM HCL 2 MG/2ML IJ SOLN
INTRAMUSCULAR | Status: AC
  Filled 2018-03-27: qty 2

## 2018-03-27 MED ORDER — VERAPAMIL HCL 2.5 MG/ML IV SOLN
INTRAVENOUS | Status: AC
  Filled 2018-03-27: qty 2

## 2018-03-27 MED ORDER — POTASSIUM CHLORIDE CRYS ER 20 MEQ PO TBCR
40.0000 meq | EXTENDED_RELEASE_TABLET | Freq: Once | ORAL | Status: AC
  Administered 2018-03-27: 40 meq via ORAL
  Filled 2018-03-27: qty 2

## 2018-03-27 MED ORDER — MIDAZOLAM HCL 2 MG/2ML IJ SOLN
INTRAMUSCULAR | Status: DC | PRN
  Administered 2018-03-27 (×2): 1 mg via INTRAVENOUS

## 2018-03-27 MED ORDER — HEPARIN (PORCINE) IN NACL 1000-0.9 UT/500ML-% IV SOLN
INTRAVENOUS | Status: AC
  Filled 2018-03-27: qty 1000

## 2018-03-27 MED ORDER — SODIUM CHLORIDE 0.9% FLUSH: 3.0000 mL | INTRAVENOUS | Status: DC | PRN

## 2018-03-27 MED ORDER — FENTANYL CITRATE (PF) 100 MCG/2ML IJ SOLN
INTRAMUSCULAR | Status: AC
  Filled 2018-03-27: qty 2

## 2018-03-27 MED ORDER — SODIUM CHLORIDE 0.9 % IV SOLN: 250.0000 mL | INTRAVENOUS | Status: DC | PRN

## 2018-03-27 MED ORDER — NITROGLYCERIN 1 MG/10 ML FOR IR/CATH LAB
INTRA_ARTERIAL | Status: DC | PRN
  Administered 2018-03-27 (×2): 200 ug via INTRACORONARY

## 2018-03-27 MED ORDER — THE SENSUOUS HEART BOOK
Freq: Once | Status: AC
  Administered 2018-03-27: 22:00:00
  Filled 2018-03-27: qty 1

## 2018-03-27 MED ORDER — ANGIOPLASTY BOOK
Freq: Once | Status: AC
  Administered 2018-03-27: 22:00:00
  Filled 2018-03-27: qty 1

## 2018-03-27 MED ORDER — HEPARIN SODIUM (PORCINE) 1000 UNIT/ML IJ SOLN
INTRAMUSCULAR | Status: DC | PRN
  Administered 2018-03-27: 2000 [IU] via INTRAVENOUS
  Administered 2018-03-27 (×2): 5000 [IU] via INTRAVENOUS

## 2018-03-27 MED ORDER — VERAPAMIL HCL 2.5 MG/ML IV SOLN
INTRAVENOUS | Status: DC | PRN
  Administered 2018-03-27: 10 mL via INTRA_ARTERIAL

## 2018-03-27 MED ORDER — SODIUM CHLORIDE 0.9% FLUSH: 3.0000 mL | Freq: Two times a day (BID) | INTRAVENOUS | Status: DC

## 2018-03-27 MED ORDER — LIDOCAINE HCL (PF) 1 % IJ SOLN
INTRAMUSCULAR | Status: AC
  Filled 2018-03-27: qty 30

## 2018-03-27 SURGICAL SUPPLY — 18 items
BALLN EMERGE MR 2.0X12 (BALLOONS) ×2
BALLOON EMERGE MR 2.0X12 (BALLOONS) IMPLANT
CATH INFINITI 5 FR JL3.5 (CATHETERS) ×1 IMPLANT
CATH INFINITI 5FR ANG PIGTAIL (CATHETERS) ×1 IMPLANT
CATH INFINITI JR4 5F (CATHETERS) ×1 IMPLANT
CATH LAUNCHER 6FR EBU3.5 (CATHETERS) ×1 IMPLANT
DEVICE RAD COMP TR BAND LRG (VASCULAR PRODUCTS) ×1 IMPLANT
GLIDESHEATH SLEND SS 6F .021 (SHEATH) ×1 IMPLANT
GUIDEWIRE INQWIRE 1.5J.035X260 (WIRE) IMPLANT
INQWIRE 1.5J .035X260CM (WIRE) ×2
KIT ENCORE 26 ADVANTAGE (KITS) ×1 IMPLANT
KIT HEART LEFT (KITS) ×2 IMPLANT
PACK CARDIAC CATHETERIZATION (CUSTOM PROCEDURE TRAY) ×2 IMPLANT
STENT SYNERGY DES 2.25X16 (Permanent Stent) ×1 IMPLANT
SYR MEDRAD MARK V 150ML (SYRINGE) ×2 IMPLANT
TRANSDUCER W/STOPCOCK (MISCELLANEOUS) ×2 IMPLANT
TUBING CIL FLEX 10 FLL-RA (TUBING) ×2 IMPLANT
WIRE ASAHI PROWATER 180CM (WIRE) ×1 IMPLANT

## 2018-03-27 NOTE — Progress Notes (Addendum)
Progress Note  Patient Name: Terry Wright Date of Encounter: 03/27/2018  Primary Cardiologist: No primary care provider on file.  Subjective   Feeling well this morning. No chest pain.   Inpatient Medications    Scheduled Meds: . carvedilol  6.25 mg Oral BID WC  . clopidogrel  75 mg Oral Daily  . pantoprazole  40 mg Oral Daily  . potassium chloride  40 mEq Oral Once  . rosuvastatin  20 mg Oral Q supper  . sodium chloride flush  3 mL Intravenous Q12H   Continuous Infusions: . sodium chloride    . sodium chloride 1 mL/kg/hr (03/27/18 0615)   PRN Meds: sodium chloride, acetaminophen, LORazepam, nitroGLYCERIN, ondansetron (ZOFRAN) IV, senna-docusate, sodium chloride flush   Vital Signs    Vitals:   03/26/18 2338 03/27/18 0411 03/27/18 0829 03/27/18 1017  BP: 124/74 131/80 111/67 116/77  Pulse: 74 70 64   Resp: 18 18 18    Temp: 98.1 F (36.7 C) 98.9 F (37.2 C) 98.4 F (36.9 C)   TempSrc: Oral Oral Oral   SpO2: 94% 98% 96%   Weight:  94.1 kg    Height:        Intake/Output Summary (Last 24 hours) at 03/27/2018 1106 Last data filed at 03/27/2018 0940 Gross per 24 hour  Intake 1107.4 ml  Output -  Net 1107.4 ml   Filed Weights   03/25/18 1605 03/26/18 0045 03/27/18 0411  Weight: 99.8 kg 94.8 kg 94.1 kg    Telemetry    SR with PVCs - Personally Reviewed  Physical Exam   General: Well developed, well nourished, male appearing in no acute distress. Head: Normocephalic, atraumatic.  Neck: Supple without bruits, JVD. Lungs:  Resp regular and unlabored, CTA. Heart: RRR, S1, S2, no murmur; no rub. Abdomen: Soft, non-tender, non-distended with normoactive bowel sounds. No hepatomegaly. Extremities: No clubbing, cyanosis, edema. Distal pedal pulses are 2+ bilaterally. Neuro: Alert and oriented X 3. Moves all extremities spontaneously. Psych: Normal affect.  Labs    Chemistry Recent Labs  Lab 03/25/18 1604 03/27/18 0422  NA 138 141  K 3.7  3.5  CL 106 107  CO2 22 26  GLUCOSE 147* 90  BUN 12 13  CREATININE 0.94 0.92  CALCIUM 9.2 8.7*  GFRNONAA >60 >60  GFRAA >60 >60  ANIONGAP 10 8     Hematology Recent Labs  Lab 03/25/18 1604 03/27/18 0422  WBC 6.2 6.6  RBC 5.27 4.84  HGB 16.3 15.0  HCT 48.2 45.6  MCV 91.5 94.2  MCH 30.9 31.0  MCHC 33.8 32.9  RDW 12.9 13.1  PLT 144* 146*    Cardiac Enzymes Recent Labs  Lab 03/25/18 2153 03/26/18 0432  TROPONINI <0.03 <0.03    Recent Labs  Lab 03/25/18 1612  TROPIPOC 0.00     BNPNo results for input(s): BNP, PROBNP in the last 168 hours.   DDimer  Recent Labs  Lab 03/25/18 1604  DDIMER 0.92*      Radiology    Dg Chest 2 View  Result Date: 03/25/2018 CLINICAL DATA:  Midsternal chest pain with dull headache today. EXAM: CHEST - 2 VIEW COMPARISON:  05/30/2007 FINDINGS: The heart size and mediastinal contours are within normal limits. Both lungs are clear. Minimal left basilar atelectasis. The visualized skeletal structures are unremarkable. IMPRESSION: No active cardiopulmonary disease. Electronically Signed   By: Ashley Royalty M.D.   On: 03/25/2018 16:40   Ct Angio Chest Pe W/cm &/or Wo Cm  Result Date: 03/25/2018  CLINICAL DATA:  Substernal chest pain, shortness of breath. EXAM: CT ANGIOGRAPHY CHEST WITH CONTRAST TECHNIQUE: Multidetector CT imaging of the chest was performed using the standard protocol during bolus administration of intravenous contrast. Multiplanar CT image reconstructions and MIPs were obtained to evaluate the vascular anatomy. CONTRAST:  123mL ISOVUE-370 IOPAMIDOL (ISOVUE-370) INJECTION 76% COMPARISON:  05/18/2017 FINDINGS: Cardiovascular: No filling defects in the pulmonary arteries to suggest pulmonary emboli. Heart is upper limits normal in size. Aorta is normal caliber. Scattered moderate coronary artery calcifications. Mediastinum/Nodes: No mediastinal, hilar, or axillary adenopathy. Lungs/Pleura: Lingular atelectasis or scarring. No other  confluent opacities or effusions. Upper Abdomen: Imaging into the upper abdomen shows no acute findings. Musculoskeletal: Chest wall soft tissues are unremarkable. No acute bony abnormality. Review of the MIP images confirms the above findings. IMPRESSION: No evidence of pulmonary embolus. Coronary artery disease. No acute cardiopulmonary disease. Electronically Signed   By: Rolm Baptise M.D.   On: 03/25/2018 19:54    Cardiac Studies   N/a   Patient Profile     63 y.o. male history of NSTEMI in 07/2016 with VT/VF arrest   Underwent intervention to LCx  Presented yesterday with new onset CP. Had long run of VT on telemetry yesterday. Planned for cath today.   Assessment & Plan    1  Chest pressure: Remains chest pain free. Trop neg. Had a long run of VT on telemetry yesterday. Planned for cath today.   2  HL: Continue statin  3  HTN: stable, soft at times. Continue BB  4. NSVT: Had a long run on telemetry yesterday. K+ 3.5 today, supplement, and check Mag.   Signed, Reino Bellis, NP  03/27/2018, 11:06 AM  Pager # [phone]   I have seen, examined and evaluated the patient this AM along with Reino Bellis, NP-C.  After reviewing all the available data and chart, we discussed the patients laboratory, study & physical findings as well as symptoms in detail. I agree with HER findings, examination as well as impression recommendations as per our discussion.    CHUCK is feeling better today.  No further chest pain.  Telemetry remained stable, but with run of VT on monitor on initial evaluation, plan is for cardiac catheterization today.  Was hypokalemic yesterday, repleted  Otherwise on stable regimen.  More plans pending cath results.  [name], M.D., M.S. Interventional Cardiologist   Pager # [phone] Phone # [phone] [address] [place], [place] [address]  3   For questions or updates, please contact [facility] Please consult [email or web address]  for contact info under Cardiology/STEMI.

## 2018-03-27 NOTE — Interval H&P Note (Signed)
History and Physical Interval Note:  03/27/2018 3:05 PM  Terry Wright  has presented today for surgery, with the diagnosis of vt  The various methods of treatment have been discussed with the patient and family. After consideration of risks, benefits and other options for treatment, the patient has consented to  Procedure(s): LEFT HEART CATH AND CORONARY ANGIOGRAPHY (N/A) as a surgical intervention .  The patient's history has been reviewed, patient examined, no change in status, stable for surgery.  I have reviewed the patient's chart and labs.  Questions were answered to the patient's satisfaction.   Cath Lab Visit (complete for each Cath Lab visit)  Clinical Evaluation Leading to the Procedure:   ACS: Yes.    Non-ACS:    Anginal Classification: CCS III  Anti-ischemic medical therapy: Minimal Therapy (1 class of medications)  Non-Invasive Test Results: No non-invasive testing performed  Prior CABG: No previous CABG        Collier Salina [facility] 03/27/2018 3:05 PM

## 2018-03-27 NOTE — Progress Notes (Signed)
To the best of my knowledge, documentation by K Tardy [employer] nursing student is correct. 

## 2018-03-27 NOTE — H&P (View-Only) (Signed)
Progress Note  Patient Name: Barney Russomanno Peffley Date of Encounter: 03/27/2018  Primary Cardiologist: No primary care provider on file.  Subjective   Feeling well this morning. No chest pain.   Inpatient Medications    Scheduled Meds: . carvedilol  6.25 mg Oral BID WC  . clopidogrel  75 mg Oral Daily  . pantoprazole  40 mg Oral Daily  . potassium chloride  40 mEq Oral Once  . rosuvastatin  20 mg Oral Q supper  . sodium chloride flush  3 mL Intravenous Q12H   Continuous Infusions: . sodium chloride    . sodium chloride 1 mL/kg/hr (03/27/18 0615)   PRN Meds: sodium chloride, acetaminophen, LORazepam, nitroGLYCERIN, ondansetron (ZOFRAN) IV, senna-docusate, sodium chloride flush   Vital Signs    Vitals:   03/26/18 2338 03/27/18 0411 03/27/18 0829 03/27/18 1017  BP: 124/74 131/80 111/67 116/77  Pulse: 74 70 64   Resp: 18 18 18    Temp: 98.1 F (36.7 C) 98.9 F (37.2 C) 98.4 F (36.9 C)   TempSrc: Oral Oral Oral   SpO2: 94% 98% 96%   Weight:  94.1 kg    Height:        Intake/Output Summary (Last 24 hours) at 03/27/2018 1106 Last data filed at 03/27/2018 0940 Gross per 24 hour  Intake 1107.4 ml  Output -  Net 1107.4 ml   Filed Weights   03/25/18 1605 03/26/18 0045 03/27/18 0411  Weight: 99.8 kg 94.8 kg 94.1 kg    Telemetry    SR with PVCs - Personally Reviewed  Physical Exam   General: Well developed, well nourished, male appearing in no acute distress. Head: Normocephalic, atraumatic.  Neck: Supple without bruits, JVD. Lungs:  Resp regular and unlabored, CTA. Heart: RRR, S1, S2, no murmur; no rub. Abdomen: Soft, non-tender, non-distended with normoactive bowel sounds. No hepatomegaly. Extremities: No clubbing, cyanosis, edema. Distal pedal pulses are 2+ bilaterally. Neuro: Alert and oriented X 3. Moves all extremities spontaneously. Psych: Normal affect.  Labs    Chemistry Recent Labs  Lab 03/25/18 1604 03/27/18 0422  NA 138 141  K 3.7  3.5  CL 106 107  CO2 22 26  GLUCOSE 147* 90  BUN 12 13  CREATININE 0.94 0.92  CALCIUM 9.2 8.7*  GFRNONAA >60 >60  GFRAA >60 >60  ANIONGAP 10 8     Hematology Recent Labs  Lab 03/25/18 1604 03/27/18 0422  WBC 6.2 6.6  RBC 5.27 4.84  HGB 16.3 15.0  HCT 48.2 45.6  MCV 91.5 94.2  MCH 30.9 31.0  MCHC 33.8 32.9  RDW 12.9 13.1  PLT 144* 146*    Cardiac Enzymes Recent Labs  Lab 03/25/18 2153 03/26/18 0432  TROPONINI <0.03 <0.03    Recent Labs  Lab 03/25/18 1612  TROPIPOC 0.00     BNPNo results for input(s): BNP, PROBNP in the last 168 hours.   DDimer  Recent Labs  Lab 03/25/18 1604  DDIMER 0.92*      Radiology    Dg Chest 2 View  Result Date: 03/25/2018 CLINICAL DATA:  Midsternal chest pain with dull headache today. EXAM: CHEST - 2 VIEW COMPARISON:  05/30/2007 FINDINGS: The heart size and mediastinal contours are within normal limits. Both lungs are clear. Minimal left basilar atelectasis. The visualized skeletal structures are unremarkable. IMPRESSION: No active cardiopulmonary disease. Electronically Signed   By: Ashley Royalty M.D.   On: 03/25/2018 16:40   Ct Angio Chest Pe W/cm &/or Wo Cm  Result Date: 03/25/2018  CLINICAL DATA:  Substernal chest pain, shortness of breath. EXAM: CT ANGIOGRAPHY CHEST WITH CONTRAST TECHNIQUE: Multidetector CT imaging of the chest was performed using the standard protocol during bolus administration of intravenous contrast. Multiplanar CT image reconstructions and MIPs were obtained to evaluate the vascular anatomy. CONTRAST:  179mL ISOVUE-370 IOPAMIDOL (ISOVUE-370) INJECTION 76% COMPARISON:  05/18/2017 FINDINGS: Cardiovascular: No filling defects in the pulmonary arteries to suggest pulmonary emboli. Heart is upper limits normal in size. Aorta is normal caliber. Scattered moderate coronary artery calcifications. Mediastinum/Nodes: No mediastinal, hilar, or axillary adenopathy. Lungs/Pleura: Lingular atelectasis or scarring. No other  confluent opacities or effusions. Upper Abdomen: Imaging into the upper abdomen shows no acute findings. Musculoskeletal: Chest wall soft tissues are unremarkable. No acute bony abnormality. Review of the MIP images confirms the above findings. IMPRESSION: No evidence of pulmonary embolus. Coronary artery disease. No acute cardiopulmonary disease. Electronically Signed   By: Rolm Baptise M.D.   On: 03/25/2018 19:54    Cardiac Studies   N/a   Patient Profile     62 y.o. male history of NSTEMI in 07/2016 with VT/VF arrest   Underwent intervention to LCx  Presented yesterday with new onset CP. Had long run of VT on telemetry yesterday. Planned for cath today.   Assessment & Plan    1  Chest pressure: Remains chest pain free. Trop neg. Had a long run of VT on telemetry yesterday. Planned for cath today.   2  HL: Continue statin  3  HTN: stable, soft at times. Continue BB  4. NSVT: Had a long run on telemetry yesterday. K+ 3.5 today, supplement, and check Mag.   Signed, Reino Bellis, NP  03/27/2018, 11:06 AM  Pager # [phone]   I have seen, examined and evaluated the patient this AM along with Reino Bellis, NP-C.  After reviewing all the available data and chart, we discussed the patients laboratory, study & physical findings as well as symptoms in detail. I agree with HER findings, examination as well as impression recommendations as per our discussion.    CHUCK is feeling better today.  No further chest pain.  Telemetry remained stable, but with run of VT on monitor on initial evaluation, plan is for cardiac catheterization today.  Was hypokalemic yesterday, repleted  Otherwise on stable regimen.  More plans pending cath results.  [name], M.D., M.S. Interventional Cardiologist   Pager # [phone] Phone # [phone] [address] [place],  [address]  3   For questions or updates, please contact [place] Please consult [email or web address]  for contact info under Cardiology/STEMI.

## 2018-03-28 ENCOUNTER — Encounter (HOSPITAL_COMMUNITY): Payer: Self-pay | Admitting: Cardiology

## 2018-03-28 DIAGNOSIS — I209 Angina pectoris, unspecified: Secondary | ICD-10-CM | POA: Diagnosis not present

## 2018-03-28 DIAGNOSIS — I472 Ventricular tachycardia: Secondary | ICD-10-CM | POA: Diagnosis not present

## 2018-03-28 DIAGNOSIS — I700 Atherosclerosis of aorta: Secondary | ICD-10-CM | POA: Diagnosis present

## 2018-03-28 DIAGNOSIS — Z955 Presence of coronary angioplasty implant and graft: Secondary | ICD-10-CM | POA: Diagnosis not present

## 2018-03-28 DIAGNOSIS — E876 Hypokalemia: Secondary | ICD-10-CM | POA: Diagnosis present

## 2018-03-28 DIAGNOSIS — I200 Unstable angina: Secondary | ICD-10-CM | POA: Diagnosis not present

## 2018-03-28 DIAGNOSIS — Z8249 Family history of ischemic heart disease and other diseases of the circulatory system: Secondary | ICD-10-CM | POA: Diagnosis not present

## 2018-03-28 DIAGNOSIS — I252 Old myocardial infarction: Secondary | ICD-10-CM | POA: Diagnosis not present

## 2018-03-28 DIAGNOSIS — Z7902 Long term (current) use of antithrombotics/antiplatelets: Secondary | ICD-10-CM | POA: Diagnosis not present

## 2018-03-28 DIAGNOSIS — R079 Chest pain, unspecified: Secondary | ICD-10-CM | POA: Diagnosis present

## 2018-03-28 DIAGNOSIS — E785 Hyperlipidemia, unspecified: Secondary | ICD-10-CM | POA: Diagnosis present

## 2018-03-28 DIAGNOSIS — J432 Centrilobular emphysema: Secondary | ICD-10-CM | POA: Diagnosis present

## 2018-03-28 DIAGNOSIS — Z79899 Other long term (current) drug therapy: Secondary | ICD-10-CM | POA: Diagnosis not present

## 2018-03-28 DIAGNOSIS — I499 Cardiac arrhythmia, unspecified: Secondary | ICD-10-CM

## 2018-03-28 DIAGNOSIS — Z86718 Personal history of other venous thrombosis and embolism: Secondary | ICD-10-CM | POA: Diagnosis not present

## 2018-03-28 DIAGNOSIS — I25110 Atherosclerotic heart disease of native coronary artery with unstable angina pectoris: Secondary | ICD-10-CM | POA: Diagnosis not present

## 2018-03-28 DIAGNOSIS — I340 Nonrheumatic mitral (valve) insufficiency: Secondary | ICD-10-CM | POA: Diagnosis present

## 2018-03-28 DIAGNOSIS — I255 Ischemic cardiomyopathy: Secondary | ICD-10-CM | POA: Diagnosis present

## 2018-03-28 DIAGNOSIS — K219 Gastro-esophageal reflux disease without esophagitis: Secondary | ICD-10-CM | POA: Diagnosis present

## 2018-03-28 DIAGNOSIS — I10 Essential (primary) hypertension: Secondary | ICD-10-CM | POA: Diagnosis present

## 2018-03-28 DIAGNOSIS — Z87891 Personal history of nicotine dependence: Secondary | ICD-10-CM | POA: Diagnosis not present

## 2018-03-28 DIAGNOSIS — Z8674 Personal history of sudden cardiac arrest: Secondary | ICD-10-CM | POA: Diagnosis not present

## 2018-03-28 DIAGNOSIS — I493 Ventricular premature depolarization: Secondary | ICD-10-CM | POA: Diagnosis not present

## 2018-03-28 DIAGNOSIS — I25119 Atherosclerotic heart disease of native coronary artery with unspecified angina pectoris: Secondary | ICD-10-CM | POA: Diagnosis not present

## 2018-03-28 LAB — BASIC METABOLIC PANEL
Anion gap: 8 (ref 5–15)
BUN: 9 mg/dL (ref 8–23)
CO2: 23 mmol/L (ref 22–32)
Calcium: 8.5 mg/dL — ABNORMAL LOW (ref 8.9–10.3)
Chloride: 109 mmol/L (ref 98–111)
Creatinine, Ser: 0.89 mg/dL (ref 0.61–1.24)
GFR calc Af Amer: >60 mL/min (ref >60)
GFR calc non Af Amer: >60 mL/min (ref >60)
Glucose, Bld: 101 mg/dL — ABNORMAL HIGH (ref 70–99)
Potassium: 3.8 mmol/L (ref 3.5–5.1)
Sodium: 140 mmol/L (ref 135–145)

## 2018-03-28 LAB — CBC
HCT: 42.4 % (ref 39.0–52.0)
Hemoglobin: 14.2 g/dL (ref 13.0–17.0)
MCH: 30.9 pg (ref 26.0–34.0)
MCHC: 33.5 g/dL (ref 30.0–36.0)
MCV: 92.4 fL (ref 78.0–100.0)
Platelets: 145 10*3/uL — ABNORMAL LOW (ref 150–400)
RBC: 4.59 MIL/uL (ref 4.22–5.81)
RDW: 12.9 % (ref 11.5–15.5)
WBC: 5.8 10*3/uL (ref 4.0–10.5)

## 2018-03-28 MED ORDER — ASPIRIN 81 MG PO TBEC: 81.0000 mg | DELAYED_RELEASE_TABLET | Freq: Every day | ORAL | Status: DC

## 2018-03-28 MED ORDER — POTASSIUM CHLORIDE CRYS ER 20 MEQ PO TBCR
20.0000 meq | EXTENDED_RELEASE_TABLET | Freq: Once | ORAL | Status: AC
  Administered 2018-03-28: 08:00:00 20 meq via ORAL
  Filled 2018-03-28: qty 1

## 2018-03-28 MED ORDER — NITROGLYCERIN 0.4 MG SL SUBL: 0.4000 mg | SUBLINGUAL_TABLET | SUBLINGUAL | 1 refills | Status: DC | PRN

## 2018-03-28 MED ORDER — ASPIRIN EC 81 MG PO TBEC
81.0000 mg | DELAYED_RELEASE_TABLET | Freq: Every day | ORAL | Status: DC
  Administered 2018-03-28: 10:00:00 81 mg via ORAL
  Filled 2018-03-28: qty 1

## 2018-03-28 NOTE — Progress Notes (Signed)
TR BAND REMOVAL  LOCATION:    right radial  DEFLATED PER PROTOCOL:    Yes.    TIME BAND OFF / DRESSING APPLIED:    2146   SITE UPON ARRIVAL:    Level 0  SITE AFTER BAND REMOVAL:    Level 0  CIRCULATION SENSATION AND MOVEMENT:    Within Normal Limits   Yes.    COMMENTS:   Post TR band instructions given, good cap refill

## 2018-03-28 NOTE — Discharge Summary (Addendum)
Discharge Summary    Patient ID: Terry Wright,  MRN: [id number], DOB/AGE: 08/30/1955 62 y.o.  Admit date: 03/25/2018 Discharge date: 03/28/2018  Primary Care Provider: Marin Olp Primary Cardiologist: Dr. Ellyn Hack  Discharge Diagnoses    Active Problems:   Chest pain   GERD (gastroesophageal reflux disease)   Essential hypertension   Ventricular bigeminy   Unstable angina ([place])   Allergies No Known Allergies  Diagnostic Studies/Procedures    Cath: 03/27/18   Ost LAD to Prox LAD lesion is 20% stenosed.  Previously placed Prox Cx drug eluting stent is widely patent.  Ost 3rd Mrg lesion is 95% stenosed.  A drug-eluting stent was successfully placed using a STENT SYNERGY DES [id number].  Post intervention, there is a 0% residual stenosis.  Dist RCA-1 lesion is 20% stenosed.  Dist RCA-2 lesion is 70% stenosed.  There is mild left ventricular systolic dysfunction.  LV end diastolic pressure is normal.  The left ventricular ejection fraction is 50-55% by visual estimate.   1. 2 vessel obstructive CAD     - 95% distal LCx/OM3     -70% distal RCA at trifurcation 2. Widely patent stent in the proximal LCx 3. Low normal LV function 4. Normal LVEDP 5. Successful PCI of the distal LCx/OM3 with DES  Anticipate DC in am.  Recommend uninterrupted dual antiplatelet therapy with Aspirin 81mg  daily and Clopidogrel 75mg  daily for a minimum of 12 months (ACS - Class I recommendation). _____________   History of Present Illness      62 y.o. male with history of coronary artery disease status post STEMI (07/2016, left circumflex, complicated by VT/VF arrest), hypertension, hyperlipidemia, who presents with chest pain. The patient had been doing very well since his acute MI in January 2018.  He had not had any recurrence of chest pain until the day of admission, when he noted the onset of substernal chest discomfort but came on at rest.  He had waxing and  waning episodes of chest discomfort that lasted 5 to 10 minutes at a time, but had no relationship to exertion.  There was no associated shortness of breath, nausea, vomiting, diaphoresis, palpitations, presyncope or syncope.  Given several episodes of chest discomfort, he presented to the emergency department for further evaluation.  In the emergency department, he was mildly hypertensive upon presentation, but the remainder of his vital signs were within normal limits.  His ECG showed sinus rhythm with PVCs, but no acute ischemic changes.  His labs were within normal limits, including 2 sets of troponins drawn approximately 6 hours apart.  His d-dimer was elevated, so he was sent for a CT PE protocol, which showed no evidence of pulmonary embolus.  He was then admitted to the cardiology service for further management.  Hospital Course     Initial plan was to send for stress test, but he had a long run of VT on telemetry. Stable electrolytes. Given this, he underwent cardiac cath noted above with successful PCI of the Lcx/OM3 with DES x1. LV gram noted EF of 50-55%. Plan for DAPT with ASA/plavix for at least 12 months. Labs were stable post cath. Ambulated without chest pain. Worked well with cardiac rehab. Other home medications were continued the same.   General: Well developed, well nourished, male appearing in no acute distress. Head: Normocephalic, atraumatic.  Neck: Supple without bruits, JVD. Lungs:  Resp regular and unlabored, CTA. Heart: RRR, S1, S2, no S3, S4, or murmur; no rub. Abdomen: Soft,  non-tender, non-distended with normoactive bowel sounds. No hepatomegaly. No rebound/guarding. No obvious abdominal masses. Extremities: No clubbing, cyanosis, edema. Distal pedal pulses are 2+ bilaterally. R right cath site stable without bruising or hematoma Neuro: Alert and oriented X 3. Moves all extremities spontaneously. Psych: Normal affect.  Terry Wright was seen by Dr. Ellyn Hack  and determined stable for discharge home. Follow up in the office has been arranged. Medications are listed below.   _____________  Discharge Vitals Blood pressure 104/68, pulse 68, temperature 97.9 F (36.6 C), temperature source Oral, resp. rate 18, height 6\' 1"  (1.854 m), weight 93.9 kg, SpO2 95 %.  Filed Weights   03/26/18 0045 03/27/18 0411 03/28/18 0617  Weight: 94.8 kg 94.1 kg 93.9 kg    Labs & Radiologic Studies    CBC Recent Labs    03/27/18 0422 03/28/18 0357  WBC 6.6 5.8  HGB 15.0 14.2  HCT 45.6 42.4  MCV 94.2 92.4  PLT 146* 270*   Basic Metabolic Panel Recent Labs    03/27/18 0422 03/28/18 0357  NA 141 140  K 3.5 3.8  CL 107 109  CO2 26 23  GLUCOSE 90 101*  BUN 13 9  CREATININE 0.92 0.89  CALCIUM 8.7* 8.5*  MG 2.0  --    Liver Function Tests No results for input(s): AST, ALT, ALKPHOS, BILITOT, PROT, ALBUMIN in the last 72 hours. No results for input(s): LIPASE, AMYLASE in the last 72 hours. Cardiac Enzymes Recent Labs    03/25/18 2153 03/26/18 0432  TROPONINI <0.03 <0.03   BNP Invalid input(s): POCBNP D-Dimer Recent Labs    03/25/18 1604  DDIMER 0.92*   Hemoglobin A1C No results for input(s): HGBA1C in the last 72 hours. Fasting Lipid Panel Recent Labs    03/27/18 0422  CHOL 106  HDL 30*  LDLCALC 56  TRIG 99  CHOLHDL 3.5   Thyroid Function Tests No results for input(s): TSH, T4TOTAL, T3FREE, THYROIDAB in the last 72 hours.  Invalid input(s): FREET3 _____________  Dg Chest 2 View  Result Date: 03/25/2018 CLINICAL DATA:  Midsternal chest pain with dull headache today. EXAM: CHEST - 2 VIEW COMPARISON:  05/30/2007 FINDINGS: The heart size and mediastinal contours are within normal limits. Both lungs are clear. Minimal left basilar atelectasis. The visualized skeletal structures are unremarkable. IMPRESSION: No active cardiopulmonary disease. Electronically Signed   By: Ashley Royalty M.D.   On: 03/25/2018 16:40   Ct Angio Chest Pe  W/cm &/or Wo Cm  Result Date: 03/25/2018 CLINICAL DATA:  Substernal chest pain, shortness of breath. EXAM: CT ANGIOGRAPHY CHEST WITH CONTRAST TECHNIQUE: Multidetector CT imaging of the chest was performed using the standard protocol during bolus administration of intravenous contrast. Multiplanar CT image reconstructions and MIPs were obtained to evaluate the vascular anatomy. CONTRAST:  179mL ISOVUE-370 IOPAMIDOL (ISOVUE-370) INJECTION 76% COMPARISON:  05/18/2017 FINDINGS: Cardiovascular: No filling defects in the pulmonary arteries to suggest pulmonary emboli. Heart is upper limits normal in size. Aorta is normal caliber. Scattered moderate coronary artery calcifications. Mediastinum/Nodes: No mediastinal, hilar, or axillary adenopathy. Lungs/Pleura: Lingular atelectasis or scarring. No other confluent opacities or effusions. Upper Abdomen: Imaging into the upper abdomen shows no acute findings. Musculoskeletal: Chest wall soft tissues are unremarkable. No acute bony abnormality. Review of the MIP images confirms the above findings. IMPRESSION: No evidence of pulmonary embolus. Coronary artery disease. No acute cardiopulmonary disease. Electronically Signed   By: Rolm Baptise M.D.   On: 03/25/2018 19:54   Disposition   Pt is  being discharged home today in good condition.  Follow-up Plans & Appointments    Follow-up Information    Barrett, Evelene Croon, PA-C Follow up on 04/11/2018.   Specialties:  Cardiology, Radiology Why:  at [facility] for your follow up appt.  Contact information: [address] [place] [address] [phone]          Discharge Instructions    Amb Referral to Cardiac Rehabilitation   Complete by:  As directed    Diagnosis:   Coronary Stents PTCA     Call MD for:  redness, tenderness, or signs of infection (pain, swelling, redness, odor or green/yellow discharge around incision site)   Complete by:  As directed    Diet - low sodium heart healthy   Complete  by:  As directed    Discharge instructions   Complete by:  As directed    Radial Site Care Refer to this sheet in the next few weeks. These instructions provide you with information on caring for yourself after your procedure. Your caregiver may also give you more specific instructions. Your treatment has been planned according to current medical practices, but problems sometimes occur. Call your caregiver if you have any problems or questions after your procedure. HOME CARE INSTRUCTIONS You may shower the day after the procedure.Remove the bandage (dressing) and gently wash the site with plain soap and water.Gently pat the site dry.  Do not apply powder or lotion to the site.  Do not submerge the affected site in water for 3 to 5 days.  Inspect the site at least twice daily.  Do not flex or bend the affected arm for 24 hours.  No lifting over 5 pounds (2.3 kg) for 5 days after your procedure.  Do not drive home if you are discharged the same day of the procedure. Have someone else drive you.  You may drive 24 hours after the procedure unless otherwise instructed by your caregiver.  What to expect: Any bruising will usually fade within 1 to 2 weeks.  Blood that collects in the tissue (hematoma) may be painful to the touch. It should usually decrease in size and tenderness within 1 to 2 weeks.  SEEK IMMEDIATE MEDICAL CARE IF: You have unusual pain at the radial site.  You have redness, warmth, swelling, or pain at the radial site.  You have drainage (other than a small amount of blood on the dressing).  You have chills.  You have a fever or persistent symptoms for more than 72 hours.  You have a fever and your symptoms suddenly get worse.  Your arm becomes pale, cool, tingly, or numb.  You have heavy bleeding from the site. Hold pressure on the site.   PLEASE DO NOT MISS ANY DOSES OF YOUR PLAVIX!!!!! Also keep a log of you blood pressures and bring back to your follow up appt. Please  call the office with any questions.   Patients taking blood thinners should generally stay away from medicines like ibuprofen, Advil, Motrin, naproxen, and Aleve due to risk of stomach bleeding. You may take Tylenol as directed or talk to your primary doctor about alternatives.   Increase activity slowly   Complete by:  As directed        Discharge Medications     Medication List    TAKE these medications   acetaminophen 500 MG tablet Commonly known as:  TYLENOL Take 1,000 mg by mouth every 6 (six) hours as needed for headache (pain).  aspirin 81 MG EC tablet Take 1 tablet (81 mg total) by mouth daily.   carvedilol 6.25 MG tablet Commonly known as:  COREG Take 2 tablets (12.5 mg total) by mouth every morning AND 1 tablet (6.25 mg total) every evening.   clopidogrel 75 MG tablet Commonly known as:  PLAVIX TAKE 1 TABLET BY MOUTH  DAILY   LORazepam 0.5 MG tablet Commonly known as:  ATIVAN Take 1 tablet (0.5 mg total) by mouth 2 (two) times daily as needed for anxiety. What changed:  reasons to take this   nitroGLYCERIN 0.4 MG SL tablet Commonly known as:  NITROSTAT Place 1 tablet (0.4 mg total) under the tongue every 5 (five) minutes as needed for chest pain.   pantoprazole 40 MG tablet Commonly known as:  PROTONIX Take 1 tablet (40 mg total) by mouth daily.   rosuvastatin 20 MG tablet Commonly known as:  CRESTOR Take 1 tablet (20 mg total) by mouth daily. What changed:  when to take this        Acute coronary syndrome (MI, NSTEMI, STEMI, etc) this admission?: Yes.     AHA/ACC Clinical Performance & Quality Measures: 1. Aspirin prescribed? - Yes 2. ADP Receptor Inhibitor (Plavix/Clopidogrel, Brilinta/Ticagrelor or Effient/Prasugrel) prescribed (includes medically managed patients)? - Yes 3. Beta Blocker prescribed? - Yes 4. High Intensity Statin (Lipitor 40-80mg  or Crestor 20-40mg ) prescribed? - Yes 5. EF assessed during THIS hospitalization? - Yes 6. For EF  <40%, was ACEI/ARB prescribed? - Not Applicable (EF >/= 47%) 7. For EF <40%, Aldosterone Antagonist (Spironolactone or Eplerenone) prescribed? - Not Applicable (EF >/= 42%) 8. Cardiac Rehab Phase II ordered (Included Medically managed Patients)? - Yes   Outstanding Labs/Studies   N/a   Duration of Discharge Encounter   Greater than 30 minutes including physician time.  Signed, Reino Bellis NP-C 03/28/2018, 9:29 AM   I have seen, examined and evaluated the patient this AM along with Reino Bellis, NP-C.  After reviewing all the available data and chart, we discussed the patients laboratory, study & physical findings as well as symptoms in detail. I agree with her findings, examination as well as impression recommendations as per our discussion.    "[name]" much better now following PCI.  He is happy with the results of the Stent.  I am concerned about progression of disease elsewhere but is happy to know that his original stent is okay.  Still a bit concerned about the RCA but I explained to him that we will be watching this closely - > low threshold to consider stress test to evaluate for inferior ischemia. I did check his lipid panel -his LDL and total cholesterol look great on current meds.  We will continue his current dose of statin. He did not have any further episodes of nonsustained VT --continue current dose of beta-blocker (12.5 mg Am, 6.25 mg PM) He is back on aspirin and Plavix -preferably over Brilinta (did not tolerate due to dyspnea)  He will follow-up with [name], PA for initial post cath follow-up, and will keep his scheduled appointment with me in October.      [name], M.D., M.S. Interventional Cardiologist   Pager # [phone] Phone # [phone] [address] [place], [place] [address]

## 2018-03-28 NOTE — Progress Notes (Signed)
CARDIAC REHAB PHASE I   PRE:  Rate/Rhythm: 64 SR with PVC    BP: sitting 119/70    SaO2:   MODE:  Ambulation: 500 ft   POST:  Rate/Rhythm: 77 SR    BP: sitting 131/65     SaO2:   Tolerated well, no c/o. Ed completed/reviewed. Understands importance of Plavix/ASA. He is exercising and watching his diet. He is very familiar with CRPII as he just completed it last year. Will refer him again to [place], ACSM 03/28/2018 8:55 AM

## 2018-03-28 NOTE — Discharge Instructions (Addendum)
Information about your medication: Plavix (anti-platelet agent) ° °Generic Name (Brand): clopidogrel (Plavix), once daily medication ° °PURPOSE: You are taking this medication along with aspirin to lower your chance of having a heart attack, stroke, or blood clots in your heart stent. These can be fatal. Brilinta and aspirin help prevent platelets from sticking together and forming a clot that can block an artery or your stent.  ° °Common SIDE EFFECTS you may experience include: bruising or bleeding more easily, shortness of breath ° °Do not stop taking PLAVIX without talking to the doctor who prescribes it for you. People who are treated with a stent and stop taking Plavix too soon, have a higher risk of getting a blood clot in the stent, having a heart attack, or dying. If you stop Plavix because of bleeding, or for other reasons, your risk of a heart attack or stroke may increase.  ° °Tell all of your doctors and dentists that you are taking Plavix. They should talk to the doctor who prescribed Brilinta for you before you have any surgery or invasive procedure.  ° °Contact your health care provider if you experience: severe or uncontrollable bleeding, pink/red/brown urine, vomiting blood or vomit that looks like "coffee grounds", red or black stools (looks like tar), coughing up blood or blood clots °---------------------------------------------------------------------------------------------------------------------- ° ° °

## 2018-04-03 ENCOUNTER — Telehealth (HOSPITAL_COMMUNITY): Payer: Self-pay

## 2018-04-03 NOTE — Telephone Encounter (Signed)
Patients insurance is active and benefits verified through [facility] - $25.00 co-pay, no deductible, out of pocket amount of $3,000/$628.71 has been met, no co-insurance, and no pre-authorization is required. Passport/reference [id number]

## 2018-04-03 NOTE — Telephone Encounter (Signed)
Called patient in regards to Cardiac Rehab - Scheduled orientation on 05/21/18 at 1:30pm. Patient will attend the 2:45pm exc class. Mailed packet.

## 2018-04-11 ENCOUNTER — Encounter: Payer: Self-pay | Admitting: Physician Assistant

## 2018-04-11 ENCOUNTER — Ambulatory Visit (INDEPENDENT_AMBULATORY_CARE_PROVIDER_SITE_OTHER): Payer: 59 | Admitting: Physician Assistant

## 2018-04-11 VITALS — BP 114/66 | HR 61 | Ht 73.0 in | Wt 213.2 lb

## 2018-04-11 DIAGNOSIS — Z125 Encounter for screening for malignant neoplasm of prostate: Secondary | ICD-10-CM | POA: Diagnosis not present

## 2018-04-11 DIAGNOSIS — Z955 Presence of coronary angioplasty implant and graft: Secondary | ICD-10-CM

## 2018-04-11 DIAGNOSIS — I2510 Atherosclerotic heart disease of native coronary artery without angina pectoris: Secondary | ICD-10-CM | POA: Diagnosis not present

## 2018-04-11 DIAGNOSIS — I10 Essential (primary) hypertension: Secondary | ICD-10-CM | POA: Diagnosis not present

## 2018-04-11 DIAGNOSIS — E785 Hyperlipidemia, unspecified: Secondary | ICD-10-CM

## 2018-04-11 DIAGNOSIS — Z79899 Other long term (current) drug therapy: Secondary | ICD-10-CM | POA: Diagnosis not present

## 2018-04-11 LAB — PSA
PSA: 3.7
Prostate Specific Ag, Serum: 3.7 ng/mL (ref 0.0–4.0)

## 2018-04-11 LAB — HEPATIC FUNCTION PANEL
ALT: 19 IU/L (ref 0–44)
AST: 23 IU/L (ref 0–40)
Albumin: 4.4 g/dL (ref 3.6–4.8)
Alkaline Phosphatase: 64 IU/L (ref 39–117)
Bilirubin Total: 0.7 mg/dL (ref 0.0–1.2)
Bilirubin, Direct: 0.16 mg/dL (ref 0.00–0.40)
Total Protein: 6.7 g/dL (ref 6.0–8.5)

## 2018-04-11 NOTE — Patient Instructions (Signed)
Medication Instructions:  Your physician recommends that you continue on your current medications as directed. Please refer to the Current Medication list given to you today.  Labwork: Today (Hepatic, PSA)  Follow-Up: As scheduled in October with Dr. Ellyn Hack.   Any Other Special Instructions Will Be Listed Below (If Applicable).     If you need a refill on your cardiac medications before your next appointment, please call your pharmacy.

## 2018-04-11 NOTE — Progress Notes (Signed)
Cardiology Office Note   Date:  04/11/2018   ID:  Nasario, Terry Wright 02/10/1956, MRN [id number]  PCP:  Terry Olp, MD  Cardiologist: Dr. Ellyn Hack 03/27/2018 Terry Ferries, PA-C    History of Present Illness: Terry Wright is a 62 y.o. male with a history of STEMI 07/2016 s/p DES CFX complicated by VT/VF arrest, HTN, HLD, GERD, DVT, probs w/ statins, tob use.  Admitted 8/26-8/29/2019 with chest pain, cath with DES OM 3  Terry Wright presents for cardiology follow up.  He was having PVCs previously, but has not noticed any recently.   Has been consistent w/ exercise. He was walking the golf course, and staying very active prior to his PCI.   He feels well, has been contacted by cardiac rehab, he cannot do that till mid-October. He has been walking and doing well w/ this. He has not felt that he was over-exerting himself.  Wants to know if he can mow.  He has not had LFTs or a PSA done recently.   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  The boxes of food are not that heavy, he has to pull a cart with the boxes in it.  No palpitations, no presyncope or syncope.  No new dyspnea on exertion, no lower extremity edema, no orthopnea or PND.  He is tolerating Crestor 20 mg very well.  He has his home blood pressure and heart rate records with him, his heart rate is in the 50s at times.  He is asymptomatic with this.  Systolic blood pressures generally well controlled, most frequently in the 110s.  He is a little frustrated because he is doing everything as we asked, and yet the lesion in the OM 3 went from 70% to 95% in a year and a half.  What else can he do?   Past Medical History:  Diagnosis Date  . CAD S/P percutaneous coronary angioplasty 08/04/2016   100% occluded proximal circumflex: PCI with 4.0 x 16 mm Synergy DES. Also noted 65% ostial OM 3 (relatively small caliber with planned medical  management). EF 3545% with lateral hypokinesis. 03/27/18 PCI/DES to dLCX/OM3 x1. Normal EF  . DVT (deep venous thrombosis) (HCC)    Right upper arm DVT on 2 occasions in the remote past  . Ejection fraction    LV function normal, echo, February, 2010  . GERD (gastroesophageal reflux disease)   . Hyperlipidemia   . Hypertension   . Mitral regurgitation    Mild, echo, February, 2010  . Palpitations    Mild in the past  . Statin intolerance    Felt poorly after Lipitor, Crestor, TriCor  . STEMI involving left circumflex coronary artery ([place]) 08/04/2016   Occluded very large caliber codominant circumflex - PCI with single DES  . Tobacco abuse     Past Surgical History:  Procedure Laterality Date  . CARDIAC CATHETERIZATION N/A 08/04/2016   Procedure: Left Heart Cath and Coronary Angiography;  Surgeon: Terry Man, MD;  Location: [facility];  Service: Cardiovascular;  Laterality: N/A: 100% pCx --> PCI. residual 65% pOM3 (small).  ~EF 35-45%  with lateral HK.  Marland Kitchen CARDIAC CATHETERIZATION N/A 08/04/2016   Procedure: Coronary Stent Intervention;  Surgeon: Terry Man, MD;  Location: [facility] [facility];  Service: Cardiovascular;  Laterality: N/A: pCx 100%-0%: Synergy DES 4.0 x 16 (4.5 mm)  . CHONDROPLASTY Right 04/04/2017   Procedure: CHONDROPLASTY;  Surgeon:  Terry Leitz, MD;  Location: [facility];  Service: Orthopedics;  Laterality: Right;  . CORONARY STENT INTERVENTION N/A 03/27/2018   Procedure: CORONARY STENT INTERVENTION;  Surgeon: Martinique, Peter M, MD;  Location: [facility];  Service: Cardiovascular;  Laterality: N/A;  . GANGLION CYST EXCISION     oct 2017  . HERNIA REPAIR     umbilical hernia 2017 oct  . KNEE ARTHROSCOPY Right 04/04/2017   Procedure: ARTHROSCOPY RIGHT KNEE, WITH MEIDAL FEMORAL CONDYLE PATELLAR MEDIAL FEMORAL JOINT PLICA EXCISION ;  Surgeon: Terry Leitz, MD;  Location: [facility];  Service: Orthopedics;  Laterality: Right;  . LEFT HEART CATH AND CORONARY  ANGIOGRAPHY N/A 03/27/2018   Procedure: LEFT HEART CATH AND CORONARY ANGIOGRAPHY;  Surgeon: Martinique, Peter M, MD;  Location: [facility] [place] [facility];  Service: Cardiovascular;  Laterality: N/A;  . TRANSTHORACIC ECHOCARDIOGRAM  08/05/2016   post STEMI: EF 40-45%. Mild concentric LVH. Severe HK of basal-mid inferolateral wall consistent with infarct in this distribution. GR 1 DD  . TRANSTHORACIC ECHOCARDIOGRAM  01/11/2017   EF 50-55%. GR 1 DD. Mild to moderate LA dilation.    Current Outpatient Medications  Medication Sig Dispense Refill  . acetaminophen (TYLENOL) 500 MG tablet Take 1,000 mg by mouth every 6 (six) hours as needed for headache (pain).    Marland Kitchen aspirin EC 81 MG EC tablet Take 1 tablet (81 mg total) by mouth daily.    . carvedilol (COREG) 6.25 MG tablet TAKE 2 TABLETS BY MOUTH  EVERY MORNING AND 1 TABLET  EVERY EVENING. 270 tablet 1  . clopidogrel (PLAVIX) 75 MG tablet Take 75 mg by mouth daily.    Marland Kitchen LORazepam (ATIVAN) 0.5 MG tablet Take 0.5 mg by mouth 2 (two) times daily.    . nitroGLYCERIN (NITROSTAT) 0.4 MG SL tablet Place 1 tablet (0.4 mg total) under the tongue every 5 (five) minutes as needed for chest pain. 25 tablet 1  . pantoprazole (PROTONIX) 40 MG tablet Take 1 tablet (40 mg total) by mouth daily. 90 tablet 3  . rosuvastatin (CRESTOR) 20 MG tablet TAKE 1 TABLET BY MOUTH  DAILY 90 tablet 1   No current facility-administered medications for this visit.     Allergies:   Patient has no known allergies.    Social History:  The patient  reports that he quit smoking about 4 years ago. He has a 43.00 pack-year smoking history. He has never used smokeless tobacco. He reports that he drinks about 7.0 standard drinks of alcohol per week. He reports that he does not use drugs.   Family History:  The patient's family history includes Heart attack in his mother; Hypertension in his father; Other in his brother.    ROS:  Please see the history of present illness. All other systems are  reviewed and negative.    PHYSICAL EXAM: VS:  BP 114/66   Pulse 61   Ht 6\' 1"  (1.854 Wright)   Wt 213 lb 3.2 oz (96.7 kg)   SpO2 94%   BMI 28.13 kg/Wright  , BMI Body mass index is 28.13 kg/Wright. GEN: Well nourished, well developed, male in no acute distress  HEENT: normal for age  Neck: no JVD, no carotid bruit, no masses Cardiac: RRR; no murmur, no rubs, or gallops Respiratory:  clear to auscultation bilaterally, normal work of breathing GI: soft, nontender, nondistended, + BS MS: no deformity or atrophy; no edema; distal pulses are 2+ in all 4 extremities   Skin: warm and dry, no rash  Neuro:  Strength and sensation are intact Psych: euthymic mood, full affect   EKG:  EKG is ordered today. The ekg ordered today demonstrates sinus rhythm, heart rate 61, no acute ischemic changes, no pathologic Q waves  Cath: 03/27/18   Ost LAD to Prox LAD lesion is 20% stenosed.  Previously placed Prox Cx drug eluting stent is widely patent.  Ost 3rd Mrg lesion is 95% stenosed.  A drug-eluting stent was successfully placed using a STENT SYNERGY DES 2.25X16.  Post intervention, there is a 0% residual stenosis.  Dist RCA-1 lesion is 20% stenosed.  Dist RCA-2 lesion is 70% stenosed.  There is mild left ventricular systolic dysfunction.  LV end diastolic pressure is normal.  The left ventricular ejection fraction is 50-55% by visual estimate.  1. 2 vessel obstructive CAD - 95% distal LCx/OM3 -70% distal RCA at trifurcation 2. Widely patent stent in the proximal LCx 3. Low normal LV function 4. Normal LVEDP 5. Successful PCI of the distal LCx/OM3 with DES  Anticipate DC in am. Recommend uninterrupted dual antiplatelet therapy with Aspirin 81mg  daily and Clopidogrel 75mg  dailyfor a minimum of 12 months (ACS - Class I recommendation). Post-Intervention Diagram        ECHO: 01/11/2017 - Left ventricle: Mild lateral hypokinesis. The cavity size was   normal. Wall thickness was  normal. Systolic function was normal.   The estimated ejection fraction was in the range of 50% to 55%.   Doppler parameters are consistent with abnormal left ventricular   relaxation (grade 1 diastolic dysfunction). - Left atrium: The atrium was mildly to moderately dilated. - Right atrium: The atrium was mildly dilated.  Recent Labs: 05/09/2017: ALT 23 03/27/2018: Magnesium 2.0 03/28/2018: BUN 9; Creatinine, Ser 0.89; Hemoglobin 14.2; Platelets 145; Potassium 3.8; Sodium 140    Lipid Panel    Component Value Date/Time   CHOL 106 03/27/2018 0422   CHOL 110 05/09/2017 0821   TRIG 99 03/27/2018 0422   HDL 30 (L) 03/27/2018 0422   HDL 35 (L) 05/09/2017 0821   CHOLHDL 3.5 03/27/2018 0422   VLDL 20 03/27/2018 0422   LDLCALC 56 03/27/2018 0422   LDLCALC 46 05/09/2017 0821   LDLDIRECT 159.1 06/05/2011 0948     Wt Readings from Last 3 Encounters:  04/11/18 213 lb 3.2 oz (96.7 kg)  03/28/18 207 lb (93.9 kg)  09/27/17 214 lb (97.1 kg)     Other studies Reviewed: Additional studies/ records that were reviewed today include: Office notes, hospital records and testing..  ASSESSMENT AND PLAN:  1.  CAD: Continue aspirin, Plavix, beta-blocker, sublingual nitroglycerin and statin.  Okay to increase his activity, keeping the intensity level to a 5/10 or less.  Follow-up with cardiac rehab, they may be able to tweak his exercise program to help increase his HDL. -I encouraged him not to get discouraged, saying that everything that happened would have happened sooner if he had not taken such good care of himself.  2.  Hyperlipidemia: He is compliant with the Crestor, tolerates it.  His LDL is at goal, no dose increase.  However, his HDL needs to increase.  I will curbside Dr. Debara Pickett to see if there is anything else specific he should do.  Encouraged exercise.  His lipids were checked, but not liver functions.  We will check those today.  3.  Hypertension: His blood pressures under good control  on current medications.  4.  He is not currently having any difficulties with urination, but is concerned that his  prostate function has not been checked recently.  We will go ahead and get this today.   Current medicines are reviewed at length with the patient today.  The patient does not have concerns regarding medicines.  The following changes have been made:  no change  Labs/ tests ordered today include:   Orders Placed This Encounter  Procedures  . PSA  . Hepatic function panel     Disposition:   FU with Dr. Ellyn Hack  Signed, Terry Ferries, PA-C  04/11/2018 11:06 AM    [place] Phone: [phone]; Fax: [phone]  This note was written with the assistance of speech recognition software. Please excuse any transcriptional errors.

## 2018-04-15 ENCOUNTER — Telehealth (HOSPITAL_COMMUNITY): Payer: Self-pay

## 2018-04-17 ENCOUNTER — Telehealth: Payer: Self-pay

## 2018-04-17 NOTE — Telephone Encounter (Signed)
Shemika with Law office called in asking for old [facility] medical records. I made her aware we do not have these records on site.    I checked chart is in our warehouse. It has been ordered and will go to [place] w/ [employer] to process medical records.     Lorelee Market will need to fax over another release for records. I have left her a message to call me back so I can make her aware of what's going on.

## 2018-05-06 ENCOUNTER — Encounter

## 2018-05-06 ENCOUNTER — Encounter: Payer: Self-pay | Admitting: Cardiology

## 2018-05-06 ENCOUNTER — Ambulatory Visit (INDEPENDENT_AMBULATORY_CARE_PROVIDER_SITE_OTHER): Payer: 59 | Admitting: Cardiology

## 2018-05-06 VITALS — BP 124/82 | HR 59 | Ht 73.0 in | Wt 215.0 lb

## 2018-05-06 DIAGNOSIS — I255 Ischemic cardiomyopathy: Secondary | ICD-10-CM | POA: Diagnosis not present

## 2018-05-06 DIAGNOSIS — I499 Cardiac arrhythmia, unspecified: Secondary | ICD-10-CM

## 2018-05-06 DIAGNOSIS — I2510 Atherosclerotic heart disease of native coronary artery without angina pectoris: Secondary | ICD-10-CM

## 2018-05-06 DIAGNOSIS — I10 Essential (primary) hypertension: Secondary | ICD-10-CM

## 2018-05-06 DIAGNOSIS — E785 Hyperlipidemia, unspecified: Secondary | ICD-10-CM | POA: Diagnosis not present

## 2018-05-06 DIAGNOSIS — I498 Other specified cardiac arrhythmias: Secondary | ICD-10-CM

## 2018-05-06 DIAGNOSIS — Z9861 Coronary angioplasty status: Secondary | ICD-10-CM

## 2018-05-06 DIAGNOSIS — Z789 Other specified health status: Secondary | ICD-10-CM

## 2018-05-06 NOTE — Patient Instructions (Signed)
Medication Instructions:  No changes  If you need a refill on your cardiac medications before your next appointment, please call your pharmacy.   Lab work: Not needed I  Testing/Procedures: Not needed  Follow-Up: At [facility], you and your health needs are our priority.  As part of our continuing mission to provide you with exceptional heart care, we have created designated Provider Care Teams.  These Care Teams include your primary Cardiologist (physician) and Advanced Practice Providers (APPs -  Physician Assistants and Nurse Practitioners) who all work together to provide you with the care you need, when you need it. You will need a follow up appointment in 6 months ( April 2020).  Please call our office 2 months in advance to schedule this appointment.  You may see [name], MD or one of the following Advanced Practice Providers on your designated Care Team:   [name], PA-C . Jory Sims, DNP, ANP  Any Other Special Instructions Will Be Listed Below (not Applicable).

## 2018-05-06 NOTE — Progress Notes (Signed)
PCP: Marin Olp, MD  Clinic Note: Chief Complaint  Patient presents with  . Follow-up    Complaints  . Coronary Artery Disease    Recent PCI    HPI: Terry Wright is a 62 y.o. male with history of CAD with inferior posterior MI comp gated by VT arrest with recent PCI for unstable angina who presents today for one-month follow-up.    Inferior-posterior STEMI,/VT arrest 08/06/2016 --> Shock x 2 for VT - ROSC -> CATH - 100% pCx - DES PCI (Synergy DES 4.0 x 16 - 4.6 mm)    Initial EF by LV gram was 35-40% increase to 40 and 45% by echo  2-D echo 01/11/2017: EF 50-55%. GR 1 DD. Mild to moderate LA dilation.  Unstable Angina - Cath 8/28: patent stent --> Cx-OM2 95% ([facility]) -- DES PCI. ~70% dRCA trifurcation (med Rx).   Terry Wright was last seen on September 12 for post hospital follow-up after DES PCI of the dLCx-OM3.  Noted pretty much resolution of PVCs that would have been occurring prior to his PCI.  No further anginal chest pain with rest or exertion.  Was doing well walking getting back into his routine.  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  Recent Hospitalizations:   Knee Sgx (arthorscopic) Sept 2018  Studies Personally Reviewed - (if available, images/films reviewed: From Epic Chart or Care Everywhere)  03/27/18 - (UA)Cath-=PCI: 2 vessel obstructive CAD: - 95% dLCx/OM3 --Successful PCI of the dLCx/OM3 with SYNERGY DES 2.25X16, -70% distal RCA at trifurcation (Med Management).  Widely patent stent in the proximal LCx. Low normal LV function (50-55%), Normal LVEDP    Interval History: Terry Wright returns today doing well.  He has lots of questions about how his coronary disease progressed and how long the stents will be open etc.  But pretty much further symptomatic standpoint he has not had any recurrent anginal symptoms with rest or exertion.  No rapid irregular heartbeats palpitations.  No heart failure symptoms of  PND, orthopnea or edema.  No syncope or near syncope.  He has had some headaches off and on but otherwise denies any myalgias or arthralgias.  Asked about cardiac rehab, may not be willing to go for the whole time, but may build to the first month or so.   No claudication.  No bleeding issues.  ROS: A comprehensive was performed. Review of Systems  Constitutional: Negative for malaise/fatigue.  HENT: Negative for nosebleeds.   Respiratory: Negative for cough, sputum production, shortness of breath and wheezing.   Cardiovascular:       Per history of present illness  Gastrointestinal: Negative for blood in stool and melena.  Genitourinary: Negative for hematuria.  Musculoskeletal: Positive for joint pain (Left shoulder, right knee). Negative for falls and myalgias (Notably improved since switching to Crestor).  Neurological: Positive for headaches. Negative for dizziness (Occasional positional dizziness).  Endo/Heme/Allergies: Bruises/bleeds easily.  Psychiatric/Behavioral: Negative for depression and memory loss. The patient is nervous/anxious (Still has intermittent panic attacks after his cardiac arrest.  Now these had another PCI, I think he is bit more anxious than he had been before.  But no full-blown anxiety attacks so far.).   All other systems reviewed and are negative.   I have reviewed and (if needed) personally updated the patient's problem list, medications, allergies, past medical and surgical history, social and family history.   Past Medical History:  Diagnosis Date  . CAD S/P percutaneous  coronary angioplasty 07/2016, 02/2018   a) 100% pCx -- DES PCI with 4.0 x 16 mm Synergy DES. 65% ostial OM 3 (relatively small caliber - Med Rx);; b) 03/27/18 PCI/DES to dLCX/OM3 x1. Patent pCX DES. 700% dRCA trifurcation lesion (Med Rx). Normal EF  . DVT (deep venous thrombosis) (HCC)    Right upper arm DVT on 2 occasions in the remote past  . Ejection fraction    LV function normal,  echo, February, 2010  . GERD (gastroesophageal reflux disease)   . Hyperlipidemia   . Hypertension   . Mitral regurgitation    Mild, echo, February, 2010  . Palpitations    Mild in the past  . Statin intolerance    Felt poorly after Lipitor, Crestor, TriCor  . STEMI involving left circumflex coronary artery ([place]) 08/04/2016   Occluded very large caliber codominant circumflex - PCI with single Synergy DES  . Tobacco abuse     Past Surgical History:  Procedure Laterality Date  . CARDIAC CATHETERIZATION N/A 08/04/2016   Procedure: Left Heart Cath and Coronary Angiography;  Surgeon: Leonie Man, MD;  Location: [facility];  Service: Cardiovascular;  Laterality: N/A: 100% pCx --> PCI. residual 65% pOM3 (small).  ~EF 35-45%  with lateral HK.  Marland Kitchen CARDIAC CATHETERIZATION N/A 08/04/2016   Procedure: Coronary Stent Intervention;  Surgeon: Leonie Man, MD;  Location: [facility];  Service: Cardiovascular;  Laterality: N/A: pCx 100%-0%: Synergy DES 4.0 x 16 (4.6 mm)  . CHONDROPLASTY Right 04/04/2017   Procedure: CHONDROPLASTY;  Surgeon: Dorna Leitz, MD;  Location: [facility];  Service: Orthopedics;  Laterality: Right;  . CORONARY STENT INTERVENTION N/A 03/27/2018   Procedure: CORONARY STENT INTERVENTION;  Surgeon: Martinique, Peter M, MD;  Location: [facility];  Service: Cardiovascular:: dCx-OM3 95%-0%: DES PCI - Synergy 2.25 x 16 mm.  Marland Kitchen GANGLION CYST EXCISION     oct 2017  . HERNIA REPAIR     umbilical hernia 2017 oct  . KNEE ARTHROSCOPY Right 04/04/2017   Procedure: ARTHROSCOPY RIGHT KNEE, WITH MEIDAL FEMORAL CONDYLE PATELLAR MEDIAL FEMORAL JOINT PLICA EXCISION ;  Surgeon: Dorna Leitz, MD;  Location: [facility];  Service: Orthopedics;  Laterality: Right;  . LEFT HEART CATH AND CORONARY ANGIOGRAPHY N/A 03/27/2018   Procedure: LEFT HEART CATH AND CORONARY ANGIOGRAPHY;  Surgeon: Martinique, Peter M, MD;  Location: [facility];  Service: Cardiovascular:   2 vessel obstructive CAD: - 95%  dLCx/OM3 --Successful PCI of the dLCx/OM3 with SYNERGY DES 2.25X16, -70% distal RCA at trifurcation (Med Management).  Widely patent pLCx DES. Low normal LV function (50-55%), Normal LVEDP   . TRANSTHORACIC ECHOCARDIOGRAM  08/05/2016   post STEMI: EF 40-45%. Mild concentric LVH. Severe HK of basal-mid inferolateral wall consistent with infarct in this distribution. GR 1 DD  . TRANSTHORACIC ECHOCARDIOGRAM  01/11/2017   EF 50-55%. GR 1 DD. Mild to moderate LA dilation.     08/04/2016: Diagnostic Diagram            Post-Intervention Diagram            Current Meds  Medication Sig  . acetaminophen (TYLENOL) 500 MG tablet Take 1,000 mg by mouth every 6 (six) hours as needed for headache (pain).  Marland Kitchen aspirin EC 81 MG EC tablet Take 1 tablet (81 mg total) by mouth daily.  . carvedilol (COREG) 6.25 MG tablet TAKE 2 TABLETS BY MOUTH  EVERY MORNING AND 1 TABLET  EVERY EVENING.  Marland Kitchen clopidogrel (PLAVIX) 75  MG tablet Take 75 mg by mouth daily.  Marland Kitchen LORazepam (ATIVAN) 0.5 MG tablet Take 0.5 mg by mouth 2 (two) times daily.  . nitroGLYCERIN (NITROSTAT) 0.4 MG SL tablet Place 1 tablet (0.4 mg total) under the tongue every 5 (five) minutes as needed for chest pain.  . pantoprazole (PROTONIX) 40 MG tablet Take 1 tablet (40 mg total) by mouth daily.  . rosuvastatin (CRESTOR) 20 MG tablet TAKE 1 TABLET BY MOUTH  DAILY    No Known Allergies  Social History   Socioeconomic History  . Marital status: Married    Spouse name: Not on file  . Number of children: Not on file  . Years of education: Not on file  . Highest education level: Not on file  Occupational History  . Not on file  Social Needs  . Financial resource strain: Not on file  . Food insecurity:    Worry: Not on file    Inability: Not on file  . Transportation needs:    Medical: Not on file    Non-medical: Not on file  Tobacco Use  . Smoking status: Former Smoker    Packs/day: 1.00    Years: 43.00    Pack years: 43.00    Last attempt to  quit: 2015    Years since quitting: 4.7  . Smokeless tobacco: Never Used  . Tobacco comment: Former smoker quit 2015  Substance and Sexual Activity  . Alcohol use: Yes    Alcohol/week: 7.0 standard drinks    Types: 7 Glasses of wine per week    Comment: occasional  . Drug use: No  . Sexual activity: Not on file  Lifestyle  . Physical activity:    Days per week: Not on file    Minutes per session: Not on file  . Stress: Not on file  Relationships  . Social connections:    Talks on phone: Not on file    Gets together: Not on file    Attends religious service: Not on file    Active member of club or organization: Not on file    Attends meetings of clubs or organizations: Not on file    Relationship status: Not on file  Other Topics Concern  . Not on file  Social History Narrative   Married 10 years in 2018. 2 step kids- 101 and 97. 3 kids but he does not have contact with. No grandkids      Retired Theatre stage manager.       Hobbies: golf (needs shoulder replacement though, working in the yard, working out- was doing this regularly even before MI- walking/treadmill    family history includes Heart attack in his mother; Hypertension in his father; Other in his brother.  Wt Readings from Last 3 Encounters:  05/06/18 215 lb (97.5 kg)  04/11/18 213 lb 3.2 oz (96.7 kg)  03/28/18 207 lb (93.9 kg)    PHYSICAL EXAM BP 124/82   Pulse (!) 59   Ht 6\' 1"  (1.854 m)   Wt 215 lb (97.5 kg)   BMI 28.37 kg/m   Physical Exam  Constitutional: He is oriented to person, place, and time. He appears well-developed and well-nourished. No distress.  Healthy-appearing.  Well-groomed  HENT:  Head: Normocephalic and atraumatic.  Neck: Normal range of motion. Neck supple. No hepatojugular reflux and no JVD present. Carotid bruit is not present.  Cardiovascular: Regular rhythm, normal heart sounds and normal pulses.  No extrasystoles are present. Bradycardia present. PMI is not displaced. Exam  reveals no gallop and no friction rub.  No murmur heard. Pulmonary/Chest: Effort normal and breath sounds normal. No respiratory distress. He has no wheezes. He has no rales.  Abdominal: Soft. Bowel sounds are normal. He exhibits no distension. There is no tenderness. There is no rebound.  Musculoskeletal: Normal range of motion. He exhibits no edema.  Neurological: He is alert and oriented to person, place, and time.  Skin: Skin is warm and dry.  Psychiatric: He has a normal mood and affect. His behavior is normal. Judgment and thought content normal.  Nursing note and vitals reviewed.   Adult ECG Report n/a  Other studies Reviewed: Additional studies/ records that were reviewed today include:  Recent Labs:   Lab Results  Component Value Date   CHOL 106 03/27/2018   HDL 30 (L) 03/27/2018   LDLCALC 56 03/27/2018   LDLDIRECT 159.1 06/05/2011   TRIG 99 03/27/2018   CHOLHDL 3.5 03/27/2018   Lab Results  Component Value Date   CREATININE 0.89 03/28/2018   BUN 9 03/28/2018   NA 140 03/28/2018   K 3.8 03/28/2018   CL 109 03/28/2018   CO2 23 03/28/2018    ASSESSMENT / PLAN: Problem List Items Addressed This Visit    CAD S/P percutaneous coronary angioplasty - Primary (Chronic)    He is come along way since his inferior STEMI back in 2018, but was a little bit concerned that he had recurrent anginal symptoms requiring PCI for the downstream.  Now has existing RCA disease which has been somewhat scared.  We talked about the pathophysiology of that and how Plavix is probably the reason why he did not have an MI this time.  He is on aspirin and Plavix which we will continue for a year and then stop aspirin.  He is on stable dose of Crestor which based on most recent labs is okay.  He is on carvedilol tolerating current dose very well.  Continue to monitor for recurrent anginal symptoms we discussed the concerning symptoms to be again recurrence of chest pain which would warrant  potentially intervening on their RCA, but otherwise would prefer to treat medically.      Dyslipidemia, goal LDL below 50 (Chronic)    Labs from August with LDL 56.  Pretty much close to our target of less than 50.  Because he had myalgias on atorvastatin working to continue with current dose of rosuvastatin.  If his LDL were to increase, would probably consider adding Zetia.      Essential hypertension (Chronic)    Blood pressure actually looks pretty good today on current meds.  He is essentially only on carvedilol.      Ischemic cardiomyopathy (Chronic)    Resolved after revascularization.  Seem to be stable on recent evaluation.  Euvolemic on exam.      Statin intolerance    Doing well with Crestor.      Ventricular bigeminy (Chronic)    Better on beta-blocker.  Also better post PCI.         Current medicines are reviewed at length with the patient today. (+/- concerns) none The following changes have been made:Yes  Patient Instructions  Medication Instructions:  No changes  If you need a refill on your cardiac medications before your next appointment, please call your pharmacy.   Lab work: Not needed I  Testing/Procedures: Not needed  Follow-Up: At [facility], you and your health needs are our priority.  As part of our continuing mission to  provide you with exceptional heart care, we have created designated Provider Care Teams.  These Care Teams include your primary Cardiologist (physician) and Advanced Practice Providers (APPs -  Physician Assistants and Nurse Practitioners) who all work together to provide you with the care you need, when you need it. You will need a follow up appointment in 6 months ( April 2020).  Please call our office 2 months in advance to schedule this appointment.  You may see [name], MD or one of the following Advanced Practice Providers on your designated Care Team:   [name], PA-C . Jory Sims, DNP, ANP  Any  Other Special Instructions Will Be Listed Below (not Applicable).     Studies Ordered:   No orders of the defined types were placed in this encounter.     [name], M.D., M.S. Interventional Cardiologist   Pager # [phone] Phone # [phone] [address] [place], [place] [address]

## 2018-05-06 NOTE — Assessment & Plan Note (Signed)
He is come along way since his inferior STEMI back in 2018, but was a little bit concerned that he had recurrent anginal symptoms requiring PCI for the downstream.  Now has existing RCA disease which has been somewhat scared.  We talked about the pathophysiology of that and how Plavix is probably the reason why he did not have an MI this time.  He is on aspirin and Plavix which we will continue for a year and then stop aspirin.  He is on stable dose of Crestor which based on most recent labs is okay.  He is on carvedilol tolerating current dose very well.  Continue to monitor for recurrent anginal symptoms we discussed the concerning symptoms to be again recurrence of chest pain which would warrant potentially intervening on their RCA, but otherwise would prefer to treat medically.

## 2018-05-06 NOTE — Assessment & Plan Note (Signed)
Blood pressure actually looks pretty good today on current meds.  He is essentially only on carvedilol.

## 2018-05-06 NOTE — Assessment & Plan Note (Signed)
Labs from August with LDL 56.  Pretty much close to our target of less than 50.  Because he had myalgias on atorvastatin working to continue with current dose of rosuvastatin.  If his LDL were to increase, would probably consider adding Zetia.

## 2018-05-06 NOTE — Assessment & Plan Note (Signed)
Better on beta-blocker.  Also better post PCI.

## 2018-05-06 NOTE — Assessment & Plan Note (Signed)
Resolved after revascularization.  Seem to be stable on recent evaluation.  Euvolemic on exam.

## 2018-05-06 NOTE — Assessment & Plan Note (Signed)
Doing well with Crestor.

## 2018-05-15 ENCOUNTER — Telehealth (HOSPITAL_COMMUNITY): Payer: Self-pay | Admitting: Pharmacist

## 2018-05-20 ENCOUNTER — Telehealth (HOSPITAL_COMMUNITY): Payer: Self-pay | Admitting: Pharmacist

## 2018-05-20 NOTE — Telephone Encounter (Signed)
Cardiac Rehab Medication Review by a Pharmacist  Does the patient  feel that his/her medications are working for him/her?  yes  Has the patient been experiencing any side effects to the medications prescribed?  no  Does the patient measure his/her own blood pressure or blood glucose at home?  no   Does the patient have any problems obtaining medications due to transportation or finances?   no  Understanding of regimen: good Understanding of indications: good Potential of compliance: good    Pharmacist comments: Patient endorses adherence to medications. Patient is taking medications as prescribed and does not endorse any issues with medications at this time.   Thank you for allowing pharmacy to be a part of this patient's care.  Leron Croak, PharmD PGY1 Pharmacy Resident Phone: [phone] 05/20/2018 1:18 PM

## 2018-05-20 NOTE — Progress Notes (Signed)
Terry Wright 62 y.o. male DOB 05-10-1956 MRN [id number]       Nutrition  No diagnosis found. Past Medical History:  Diagnosis Date  . CAD S/P percutaneous coronary angioplasty 07/2016, 02/2018   a) 100% pCx -- DES PCI with 4.0 x 16 mm Synergy DES. 65% ostial OM 3 (relatively small caliber - Med Rx);; b) 03/27/18 PCI/DES to dLCX/OM3 x1. Patent pCX DES. 700% dRCA trifurcation lesion (Med Rx). Normal EF  . DVT (deep venous thrombosis) (HCC)    Right upper arm DVT on 2 occasions in the remote past  . Ejection fraction    LV function normal, echo, February, 2010  . GERD (gastroesophageal reflux disease)   . Hyperlipidemia   . Hypertension   . Mitral regurgitation    Mild, echo, February, 2010  . Palpitations    Mild in the past  . Statin intolerance    Felt poorly after Lipitor, Crestor, TriCor  . STEMI involving left circumflex coronary artery ([place]) 08/04/2016   Occluded very large caliber codominant circumflex - PCI with single Synergy DES  . Tobacco abuse    Meds reviewed.     Current Outpatient Medications (Cardiovascular):  .  carvedilol (COREG) 6.25 MG tablet, TAKE 2 TABLETS BY MOUTH  EVERY MORNING AND 1 TABLET  EVERY EVENING. .  nitroGLYCERIN (NITROSTAT) 0.4 MG SL tablet, Place 1 tablet (0.4 mg total) under the tongue every 5 (five) minutes as needed for chest pain. .  rosuvastatin (CRESTOR) 20 MG tablet, TAKE 1 TABLET BY MOUTH  DAILY   Current Outpatient Medications (Analgesics):  .  acetaminophen (TYLENOL) 500 MG tablet, Take 1,000 mg by mouth every 6 (six) hours as needed for headache (pain). Marland Kitchen  aspirin EC 81 MG EC tablet, Take 1 tablet (81 mg total) by mouth daily.  Current Outpatient Medications (Hematological):  .  clopidogrel (PLAVIX) 75 MG tablet, Take 75 mg by mouth daily.  Current Outpatient Medications (Other):  Marland Kitchen  LORazepam (ATIVAN) 0.5 MG tablet, Take 0.5 mg by mouth 2 (two) times daily. .  pantoprazole (PROTONIX) 40 MG tablet, Take 1 tablet (40 mg  total) by mouth daily.   HT: Ht Readings from Last 1 Encounters:  05/06/18 6\' 1"  (1.854 m)    WT: Wt Readings from Last 5 Encounters:  05/06/18 215 lb (97.5 kg)  04/11/18 213 lb 3.2 oz (96.7 kg)  03/28/18 207 lb (93.9 kg)  09/27/17 214 lb (97.1 kg)  09/04/17 213 lb 6.4 oz (96.8 kg)     There is no height or weight on file to calculate BMI.   Current tobacco use? No       Labs:  Lipid Panel     Component Value Date/Time   CHOL 106 03/27/2018 0422   CHOL 110 05/09/2017 0821   TRIG 99 03/27/2018 0422   HDL 30 (L) 03/27/2018 0422   HDL 35 (L) 05/09/2017 0821   CHOLHDL 3.5 03/27/2018 0422   VLDL 20 03/27/2018 0422   LDLCALC 56 03/27/2018 0422   LDLCALC 46 05/09/2017 0821   LDLDIRECT 159.1 06/05/2011 0948    No results found for: HGBA1C CBG (last 3)  No results for input(s): GLUCAP in the last 72 hours.  Nutrition Diagnosis ? Food-and nutrition-related knowledge deficit related to lack of exposure to information as related to diagnosis of: ? CVD   Nutrition Goal(s):  ? To be determined  Plan:  Pt to attend nutrition classes ? Nutrition I ? Nutrition II ? Portion Distortion  Will provide client-centered  nutrition education as part of interdisciplinary care.   Monitor and evaluate progress toward nutrition goal with team.  Laurina Bustle, MS, RD, LDN 05/20/2018 9:08 AM

## 2018-05-21 ENCOUNTER — Telehealth: Payer: Self-pay | Admitting: Physician Assistant

## 2018-05-21 ENCOUNTER — Encounter (HOSPITAL_COMMUNITY)
Admission: RE | Admit: 2018-05-21 | Discharge: 2018-05-21 | Disposition: A | Discharge status: Home / Self Care | Payer: 59 | Source: Ambulatory Visit | Attending: Cardiology | Admitting: Cardiology

## 2018-05-21 ENCOUNTER — Telehealth (HOSPITAL_COMMUNITY): Payer: Self-pay

## 2018-05-21 ENCOUNTER — Encounter (HOSPITAL_COMMUNITY): Payer: Self-pay

## 2018-05-21 VITALS — BP 130/80 | HR 64 | Ht 73.0 in | Wt 213.8 lb

## 2018-05-21 DIAGNOSIS — I2510 Atherosclerotic heart disease of native coronary artery without angina pectoris: Secondary | ICD-10-CM | POA: Diagnosis not present

## 2018-05-21 DIAGNOSIS — I252 Old myocardial infarction: Secondary | ICD-10-CM | POA: Diagnosis not present

## 2018-05-21 DIAGNOSIS — Z955 Presence of coronary angioplasty implant and graft: Secondary | ICD-10-CM | POA: Diagnosis not present

## 2018-05-21 NOTE — Progress Notes (Signed)
Terry Wright is here for orientation for cardiac rehab. Terry Wright was noted to have frequent trigeminal PVC's today during and after his walk test. Resting blood pressure noted at 130/80. Heart rate 64. Oxygen saturation 96% on room air. Terry Wright says he is taking his medications as prescribed and is asymptomatic. Hal Meng PA called and notified about today's ectopy. Hal increased [name]'s coreg to 12.5 mg twice a day and told the patient about the dosage change over the phone. Patient left without symptoms or complaints. Exit blood pressure 67. Blood pressure 108/70.Will fax exercise flow sheets to Dr. Allison Quarry office for review with today's ECG tracings.Barnet Pall, RN,BSN 05/21/2018 3:40 PM

## 2018-05-21 NOTE — Telephone Encounter (Signed)
Paged by Verdis Frederickson of Cardiac rehab, patient had trigeminy during cardiac rehab. Recommend increase coreg to 12.5mg  BID esp given his prior h/o of VT arrest in 2018.   Hilbert Corrigan PA Pager: [phone]

## 2018-05-21 NOTE — Telephone Encounter (Signed)
Sounds reasonable.  Miloh Alcocer, MD  

## 2018-05-21 NOTE — Progress Notes (Signed)
Cardiac Individual Treatment Plan  Patient Details  Name: Terry Wright MRN: [id number] Date of Birth: 11-01-1955 Referring Provider:     CARDIAC REHAB PHASE II ORIENTATION from 05/21/2018 in [place]  Referring Provider  [name], MD      Initial Encounter Date:    CARDIAC REHAB PHASE II ORIENTATION from 05/21/2018 in [place]  Date  05/21/18      Visit Diagnosis: S/P coronary artery stent placement 03/27/18 S/P DES OM3  Patient's Home Medications on Admission:  Current Outpatient Medications:  .  acetaminophen (TYLENOL) 500 MG tablet, Take 1,000 mg by mouth every 6 (six) hours as needed for headache (pain)., Disp: , Rfl:  .  aspirin EC 81 MG EC tablet, Take 1 tablet (81 mg total) by mouth daily., Disp: , Rfl:  .  carvedilol (COREG) 6.25 MG tablet, TAKE 2 TABLETS BY MOUTH  EVERY MORNING AND 1 TABLET  EVERY EVENING., Disp: 270 tablet, Rfl: 1 .  clopidogrel (PLAVIX) 75 MG tablet, Take 75 mg by mouth daily., Disp: , Rfl:  .  LORazepam (ATIVAN) 0.5 MG tablet, Take 0.5 mg by mouth 2 (two) times daily. , Disp: , Rfl:  .  nitroGLYCERIN (NITROSTAT) 0.4 MG SL tablet, Place 1 tablet (0.4 mg total) under the tongue every 5 (five) minutes as needed for chest pain., Disp: 25 tablet, Rfl: 1 .  pantoprazole (PROTONIX) 40 MG tablet, Take 1 tablet (40 mg total) by mouth daily., Disp: 90 tablet, Rfl: 3 .  rosuvastatin (CRESTOR) 20 MG tablet, TAKE 1 TABLET BY MOUTH  DAILY, Disp: 90 tablet, Rfl: 1  Past Medical History: Past Medical History:  Diagnosis Date  . CAD S/P percutaneous coronary angioplasty 07/2016, 02/2018   a) 100% pCx -- DES PCI with 4.0 x 16 mm Synergy DES. 65% ostial OM 3 (relatively small caliber - Med Rx);; b) 03/27/18 PCI/DES to dLCX/OM3 x1. Patent pCX DES. 700% dRCA trifurcation lesion (Med Rx). Normal EF  . DVT (deep venous thrombosis) (HCC)    Right upper arm DVT on 2 occasions in the remote past   . Ejection fraction    LV function normal, echo, February, 2010  . GERD (gastroesophageal reflux disease)   . Hyperlipidemia   . Hypertension   . Mitral regurgitation    Mild, echo, February, 2010  . Palpitations    Mild in the past  . Statin intolerance    Felt poorly after Lipitor, Crestor, TriCor  . STEMI involving left circumflex coronary artery ([place]) 08/04/2016   Occluded very large caliber codominant circumflex - PCI with single Synergy DES  . Tobacco abuse     Tobacco Use: Social History   Tobacco Use  Smoking Status Former Smoker  . Packs/day: 1.00  . Years: 43.00  . Pack years: 43.00  . Last attempt to quit: 2015  . Years since quitting: 4.8  Smokeless Tobacco Never Used  Tobacco Comment   Former smoker quit 2015    Labs: Recent Review Flowsheet Data    Labs for ITP Cardiac and Pulmonary Rehab Latest Ref Rng & Units 12/20/2011 08/04/2016 11/03/2016 05/09/2017 03/27/2018   Cholestrol 0 - 200 mg/dL 181 - 101 110 106   LDLCALC 0 - 99 mg/dL 110(H) - 50 46 56   LDLDIRECT mg/dL - - - - -   HDL >40 mg/dL 35.30(L) - 29(L) 35(L) 30(L)   Trlycerides <150 mg/dL 180.0(H) - 111 143 99   TCO2 0 - 100 mmol/L -  28 - - -      Capillary Blood Glucose: No results found for: GLUCAP   Exercise Target Goals: Exercise Program Goal: Individual exercise prescription set using results from initial 6 min walk test and THRR while considering  patient's activity barriers and safety.   Exercise Prescription Goal: Initial exercise prescription builds to 30-45 minutes a day of aerobic activity, 2-3 days per week.  Home exercise guidelines will be given to patient during program as part of exercise prescription that the participant will acknowledge.  Activity Barriers & Risk Stratification: Activity Barriers & Cardiac Risk Stratification - 05/21/18 1533      Activity Barriers & Cardiac Risk Stratification   Activity Barriers  Joint Problems   Left Shoulder pain       6 Minute  Walk:   Oxygen Initial Assessment:   Oxygen Re-Evaluation:   Oxygen Discharge (Final Oxygen Re-Evaluation):   Initial Exercise Prescription: Initial Exercise Prescription - 05/21/18 1500      Date of Initial Exercise RX and Referring Provider   Date  05/21/18    Referring Provider  [name], MD    Expected Discharge Date  08/30/18      Treadmill   MPH  3.5    Grade  2    Minutes  10    METs  3.8      Bike   Level  1.5    Minutes  10      NuStep   Level  4    SPM  85    Minutes  10    METs  3.6      Prescription Details   Frequency (times per week)  3    Duration  Progress to 30 minutes of continuous aerobic without signs/symptoms of physical distress      Intensity   THRR 40-80% of Max Heartrate  63-126    Ratings of Perceived Exertion  11-13      Progression   Progression  Continue to progress workloads to maintain intensity without signs/symptoms of physical distress.      Resistance Training   Training Prescription  Yes    Weight  3 lbs    Reps  10-15       Perform Capillary Blood Glucose checks as needed.  Exercise Prescription Changes:   Exercise Comments:   Exercise Goals and Review: Exercise Goals    Row Name 05/21/18 1542             Exercise Goals   Increase Physical Activity  Yes       Intervention  Develop an individualized exercise prescription for aerobic and resistive training based on initial evaluation findings, risk stratification, comorbidities and participant's personal goals.;Provide advice, education, support and counseling about physical activity/exercise needs.       Expected Outcomes  Short Term: Attend rehab on a regular basis to increase amount of physical activity.       Increase Strength and Stamina  Yes       Intervention  Provide advice, education, support and counseling about physical activity/exercise needs.;Develop an individualized exercise prescription for aerobic and resistive training based on  initial evaluation findings, risk stratification, comorbidities and participant's personal goals.       Expected Outcomes  Short Term: Increase workloads from initial exercise prescription for resistance, speed, and METs.       Able to understand and use rate of perceived exertion (RPE) scale  Yes       Intervention  Provide education  and explanation on how to use RPE scale       Expected Outcomes  Short Term: Able to use RPE daily in rehab to express subjective intensity level;Long Term:  Able to use RPE to guide intensity level when exercising independently       Intervention  Provide education and explanation of THRR including how the numbers were predicted and where they are located for reference       Expected Outcomes  Short Term: Able to state/look up THRR;Long Term: Able to use THRR to govern intensity when exercising independently;Short Term: Able to use daily as guideline for intensity in rehab       Able to check pulse independently  Yes       Intervention  Provide education and demonstration on how to check pulse in carotid and radial arteries.;Review the importance of being able to check your own pulse for safety during independent exercise       Expected Outcomes  Short Term: Able to explain why pulse checking is important during independent exercise;Long Term: Able to check pulse independently and accurately       Understanding of Exercise Prescription  Yes       Intervention  Provide education, explanation, and written materials on patient's individual exercise prescription       Expected Outcomes  Short Term: Able to explain program exercise prescription;Long Term: Able to explain home exercise prescription to exercise independently          Exercise Goals Re-Evaluation :   Discharge Exercise Prescription (Final Exercise Prescription Changes):   Nutrition:  Target Goals: Understanding of nutrition guidelines, daily intake of sodium 1500mg , cholesterol 200mg , calories 30%  from fat and 7% or less from saturated fats, daily to have 5 or more servings of fruits and vegetables.  Biometrics: Pre Biometrics - 05/21/18 1542      Pre Biometrics   Height  6\' 1"  (1.854 m)    Weight  97 kg    Waist Circumference  44.5 inches    Hip Circumference  43.5 inches    Waist to Hip Ratio  1.02 %    BMI (Calculated)  28.22    Triceps Skinfold  29 mm    % Body Fat  32 %    Grip Strength  49 kg    Flexibility  0 in    Single Leg Stand  30 seconds        Nutrition Therapy Plan and Nutrition Goals:   Nutrition Assessments:   Nutrition Goals Re-Evaluation:   Nutrition Goals Re-Evaluation:   Nutrition Goals Discharge (Final Nutrition Goals Re-Evaluation):   Psychosocial: Target Goals: Acknowledge presence or absence of significant depression and/or stress, maximize coping skills, provide positive support system. Participant is able to verbalize types and ability to use techniques and skills needed for reducing stress and depression.  Initial Review & Psychosocial Screening: Initial Psych Review & Screening - 05/21/18 1545      Initial Review   Current issues with  None Identified      Family Dynamics   Good Support System?  Yes   Terry Wright has his wife for support     Barriers   Psychosocial barriers to participate in program  There are no identifiable barriers or psychosocial needs.      Screening Interventions   Interventions  Provide feedback about the scores to participant       Quality of Life Scores: Quality of Life - 05/21/18 1526  Quality of Life   Select  Quality of Life      Quality of Life Scores   Health/Function Pre  25.85 %    Socioeconomic Pre  24 %    Psych/Spiritual Pre  24 %    Family Pre  24 %    GLOBAL Pre  24.77 %      Scores of 19 and below usually indicate a poorer quality of life in these areas.  A difference of  2-3 points is a clinically meaningful difference.  A difference of 2-3 points in the total score of the  Quality of Life Index has been associated with significant improvement in overall quality of life, self-image, physical symptoms, and general health in studies assessing change in quality of life.  PHQ-9: Recent Review Flowsheet Data    Depression screen [facility] 2/9 11/20/2016 08/28/2016   Decreased Interest 0 0   Down, Depressed, Hopeless 0 0   PHQ - 2 Score 0 0     Interpretation of Total Score  Total Score Depression Severity:  1-4 = Minimal depression, 5-9 = Mild depression, 10-14 = Moderate depression, 15-19 = Moderately severe depression, 20-27 = Severe depression   Psychosocial Evaluation and Intervention:   Psychosocial Re-Evaluation:   Psychosocial Discharge (Final Psychosocial Re-Evaluation):   Vocational Rehabilitation: Provide vocational rehab assistance to qualifying candidates.   Vocational Rehab Evaluation & Intervention: Vocational Rehab - 05/21/18 1546      Initial Vocational Rehab Evaluation & Intervention   Assessment shows need for Vocational Rehabilitation  No   Terry Wright works part time and does not need vocational rehab at this time      Education: Education Goals: Education classes will be provided on a weekly basis, covering required topics. Participant will state understanding/return demonstration of topics presented.  Learning Barriers/Preferences: Learning Barriers/Preferences - 05/21/18 1527      Learning Barriers/Preferences   Learning Preferences  Written Material;Verbal Instruction;Pictoral;Audio;Video;Skilled Demonstration       Education Topics: Count Your Pulse:  -Group instruction provided by verbal instruction, demonstration, patient participation and written materials to support subject.  Instructors address importance of being able to find your pulse and how to count your pulse when at home without a heart monitor.  Patients get hands on experience counting their pulse with staff help and individually.   Heart Attack, Angina, and Risk  Factor Modification:  -Group instruction provided by verbal instruction, video, and written materials to support subject.  Instructors address signs and symptoms of angina and heart attacks.    Also discuss risk factors for heart disease and how to make changes to improve heart health risk factors.   CARDIAC REHAB PHASE II EXERCISE from 11/08/2016 in [place]  Date  10/25/16  Instruction Review Code (Retired)  2- meets goals/outcomes      Functional Fitness:  -Group instruction provided by verbal instruction, demonstration, patient participation, and written materials to support subject.  Instructors address safety measures for doing things around the house.  Discuss how to get up and down off the floor, how to pick things up properly, how to safely get out of a chair without assistance, and balance training.   CARDIAC REHAB PHASE II EXERCISE from 11/08/2016 in [place]  Date  10/13/16  Instruction Review Code (Retired)  2- Media planner and Mindfulness:  -Group instruction provided by verbal instruction, patient participation, and written materials to support subject.  Instructor addresses  importance of mindfulness and meditation practice to help reduce stress and improve awareness.  Instructor also leads participants through a meditation exercise.    CARDIAC REHAB PHASE II EXERCISE from 11/08/2016 in [place]  Date  09/20/16  Instruction Review Code (Retired)  2- meets Insurance account manager for Flexibility and Mobility:  -Group instruction provided by verbal instruction, patient participation, and written materials to support subject.  Instructors lead participants through series of stretches that are designed to increase flexibility thus improving mobility.  These stretches are additional exercise for major muscle groups that are typically performed during  regular warm up and cool down.   CARDIAC REHAB PHASE II EXERCISE from 11/08/2016 in [place]  Date  10/20/16  Instruction Review Code (Retired)  R- Review/reinforce      Hands Only CPR:  -Group verbal, video, and participation provides a basic overview of AHA guidelines for community CPR. Role-play of emergencies allow participants the opportunity to practice calling for help and chest compression technique with discussion of AED use.   Hypertension: -Group verbal and written instruction that provides a basic overview of hypertension including the most recent diagnostic guidelines, risk factor reduction with self-care instructions and medication management.    Nutrition I class: Heart Healthy Eating:  -Group instruction provided by PowerPoint slides, verbal discussion, and written materials to support subject matter. The instructor gives an explanation and review of the Therapeutic Lifestyle Changes diet recommendations, which includes a discussion on lipid goals, dietary fat, sodium, fiber, plant stanol/sterol esters, sugar, and the components of a well-balanced, healthy diet.   CARDIAC REHAB PHASE II EXERCISE from 11/08/2016 in [place]  Date  09/05/16  Educator  RD  Instruction Review Code (Retired)  2- meets goals/outcomes      Nutrition II class: Lifestyle Skills:  -Group instruction provided by [employer], verbal discussion, and written materials to support subject matter. The instructor gives an explanation and review of label reading, grocery shopping for heart health, heart healthy recipe modifications, and ways to make healthier choices when eating out.   Diabetes Question & Answer:  -Group instruction provided by PowerPoint slides, verbal discussion, and written materials to support subject matter. The instructor gives an explanation and review of diabetes co-morbidities, pre- and post-prandial blood  glucose goals, pre-exercise blood glucose goals, signs, symptoms, and treatment of hypoglycemia and hyperglycemia, and foot care basics.   CARDIAC REHAB PHASE II EXERCISE from 11/08/2016 in [place]  Date  11/03/16  Instruction Review Code (Retired)  2- meets goals/outcomes      Diabetes Blitz:  -Group instruction provided by [employer], verbal discussion, and written materials to support subject matter. The instructor gives an explanation and review of the physiology behind type 1 and type 2 diabetes, diabetes medications and rational behind using different medications, pre- and post-prandial blood glucose recommendations and Hemoglobin A1c goals, diabetes diet, and exercise including blood glucose guidelines for exercising safely.    Portion Distortion:  -Group instruction provided by PowerPoint slides, verbal discussion, written materials, and food models to support subject matter. The instructor gives an explanation of serving size versus portion size, changes in portions sizes over the last 20 years, and what consists of a serving from each food group.   CARDIAC REHAB PHASE II EXERCISE from 11/08/2016 in [place]  Date  09/27/16  Educator  RD  Instruction  Review Code (Retired)  2- Water quality scientist      Stress Management:  -Group instruction provided by verbal instruction, video, and written materials to support subject matter.  Instructors review role of stress in heart disease and how to cope with stress positively.     CARDIAC REHAB PHASE II EXERCISE from 11/08/2016 in [place]  Date  10/04/16  Instruction Review Code (Retired)  2- meets goals/outcomes      Exercising on Your Own:  -Group instruction provided by verbal instruction, power point, and written materials to support subject.  Instructors discuss benefits of exercise, components of exercise, frequency and  intensity of exercise, and end points for exercise.  Also discuss use of nitroglycerin and activating EMS.  Review options of places to exercise outside of rehab.  Review guidelines for sex with heart disease.   CARDIAC REHAB PHASE II EXERCISE from 11/08/2016 in [place]  Date  08/30/16  Educator  Cleda Mccreedy  Instruction Review Code (Retired)  2- meets goals/outcomes      Cardiac Drugs I:  -Group instruction provided by verbal instruction and written materials to support subject.  Instructor reviews cardiac drug classes: antiplatelets, anticoagulants, beta blockers, and statins.  Instructor discusses reasons, side effects, and lifestyle considerations for each drug class.   CARDIAC REHAB PHASE II EXERCISE from 11/08/2016 in [place]  Date  11/08/16  Instruction Review Code (Retired)  R- Review/reinforce      Cardiac Drugs II:  -Group instruction provided by verbal instruction and written materials to support subject.  Instructor reviews cardiac drug classes: angiotensin converting enzyme inhibitors (ACE-I), angiotensin II receptor blockers (ARBs), nitrates, and calcium channel blockers.  Instructor discusses reasons, side effects, and lifestyle considerations for each drug class.   Anatomy and Physiology of the Circulatory System:  Group verbal and written instruction and models provide basic cardiac anatomy and physiology, with the coronary electrical and arterial systems. Review of: AMI, Angina, Valve disease, Heart Failure, Peripheral Artery Disease, Cardiac Arrhythmia, Pacemakers, and the ICD.   CARDIAC REHAB PHASE II EXERCISE from 11/08/2016 in [place]  Date  11/01/16  Instruction Review Code (Retired)  2- meets goals/outcomes      Other Education:  -Group or individual verbal, written, or video instructions that support the educational goals of the cardiac rehab  program.   Holiday Eating Survival Tips:  -Group instruction provided by PowerPoint slides, verbal discussion, and written materials to support subject matter. The instructor gives patients tips, tricks, and techniques to help them not only survive but enjoy the holidays despite the onslaught of food that accompanies the holidays.   Knowledge Questionnaire Score: Knowledge Questionnaire Score - 05/21/18 1526      Knowledge Questionnaire Score   Pre Score  21/24       Core Components/Risk Factors/Patient Goals at Admission: Personal Goals and Risk Factors at Admission - 05/21/18 1528      Core Components/Risk Factors/Patient Goals on Admission    Weight Management  Yes;Weight Maintenance;Weight Loss    Intervention  Weight Management: Develop a combined nutrition and exercise program designed to reach desired caloric intake, while maintaining appropriate intake of nutrient and fiber, sodium and fats, and appropriate energy expenditure required for the weight goal.;Weight Management: Provide education and appropriate resources to help participant work on and attain dietary goals.    Admit Weight  213 lb 13.5 oz (97 kg)    Expected  Outcomes  Short Term: Continue to assess and modify interventions until short term weight is achieved;Long Term: Adherence to nutrition and physical activity/exercise program aimed toward attainment of established weight goal;Weight Maintenance: Understanding of the daily nutrition guidelines, which includes 25-35% calories from fat, 7% or less cal from saturated fats, less than 200mg  cholesterol, less than 1.5gm of sodium, & 5 or more servings of fruits and vegetables daily;Weight Loss: Understanding of general recommendations for a balanced deficit meal plan, which promotes 1-2 lb weight loss per week and includes a negative energy balance of [phone] kcal/d;Understanding recommendations for meals to include 15-35% energy as protein, 25-35% energy from fat, 35-60%  energy from carbohydrates, less than 200mg  of dietary cholesterol, 20-35 gm of total fiber daily;Understanding of distribution of calorie intake throughout the day with the consumption of 4-5 meals/snacks    Hypertension  Yes    Intervention  Provide education on lifestyle modifcations including regular physical activity/exercise, weight management, moderate sodium restriction and increased consumption of fresh fruit, vegetables, and low fat dairy, alcohol moderation, and smoking cessation.;Monitor prescription use compliance.    Expected Outcomes  Short Term: Continued assessment and intervention until BP is < 140/68mm HG in hypertensive participants. < 130/92mm HG in hypertensive participants with diabetes, heart failure or chronic kidney disease.;Long Term: Maintenance of blood pressure at goal levels.    Lipids  Yes    Intervention  Provide education and support for participant on nutrition & aerobic/resistive exercise along with prescribed medications to achieve LDL 70mg , HDL >40mg .    Expected Outcomes  Short Term: Participant states understanding of desired cholesterol values and is compliant with medications prescribed. Participant is following exercise prescription and nutrition guidelines.;Long Term: Cholesterol controlled with medications as prescribed, with individualized exercise RX and with personalized nutrition plan. Value goals: LDL < 70mg , HDL > 40 mg.       Core Components/Risk Factors/Patient Goals Review:    Core Components/Risk Factors/Patient Goals at Discharge (Final Review):    ITP Comments: ITP Comments    Row Name 05/21/18 1506           ITP Comments  Dr. Fransico Him, MD          Comments: Terry Wright attended orientation from 1348 to 1511 to review rules and guidelines for program. Completed 6 minute walk test, Intitial ITP, and exercise prescription.  VSS. Telemetry-Sinus Rhythm with frequent intermittent trigeminal PVC's.  Asymptomatic. Please see previous  documentation regarding ectopy. Loma Sousa PA notified.Barnet Pall, RN,BSN 05/21/2018 4:21 PM

## 2018-05-21 NOTE — Telephone Encounter (Signed)
*  UPDATED*  Pt insurance is active and benefits verified through [facility]. Co-pay $25.00, DED $0.00/$0.00 met, out of pocket $3,000.00/$674.19 met, co-insurance 0%. No pre-authorization. [employer], 05/21/18 @ 2:51PM, REF# [id number]

## 2018-05-27 ENCOUNTER — Encounter (HOSPITAL_COMMUNITY)
Admission: RE | Admit: 2018-05-27 | Discharge: 2018-05-27 | Disposition: A | Discharge status: Home / Self Care | Payer: 59 | Source: Ambulatory Visit | Attending: Cardiology | Admitting: Cardiology

## 2018-05-27 ENCOUNTER — Encounter (HOSPITAL_COMMUNITY): Payer: 59

## 2018-05-27 DIAGNOSIS — Z955 Presence of coronary angioplasty implant and graft: Secondary | ICD-10-CM | POA: Diagnosis not present

## 2018-05-27 DIAGNOSIS — I2121 ST elevation (STEMI) myocardial infarction involving left circumflex coronary artery: Secondary | ICD-10-CM

## 2018-05-27 NOTE — Progress Notes (Signed)
Daily Session Note  Patient Details  Name: Terry Wright MRN: [id number] Date of Birth: 1955/10/20 Referring Provider:     CARDIAC REHAB PHASE II ORIENTATION from 05/21/2018 in [place]  Referring Provider  [name], MD      Encounter Date: 05/27/2018  Check In: Session Check In - 05/27/18 1454      Check-In   Supervising physician immediately available to respond to emergencies  [facility] immediately available    Physician(s)  Dr. Tyrell Antonio    Location  [facility]    Staff Present  Deitra Mayo, BS, ACSM CEP, Exercise Physiologist;Jonnie Kubly, RN, BSN;Molly DiVincenzo, MS, ACSM RCEP, Exercise Physiologist;Tara Karle Starch, RN, BSN;Tyara Nevels, MS,ACSM CEP, Exercise Physiologist    Medication changes reported      No    Fall or balance concerns reported     No    Tobacco Cessation  No Change    Warm-up and Cool-down  Performed as group-led instruction    Resistance Training Performed  Yes    VAD Patient?  No    PAD/SET Patient?  No      Pain Assessment   Currently in Pain?  No/denies    Multiple Pain Sites  No       Capillary Blood Glucose: No results found for this or any previous visit (from the past 24 hour(s)).  Exercise Prescription Changes - 05/27/18 1456      Response to Exercise   Blood Pressure (Admit)  102/62    Blood Pressure (Exercise)  128/80    Blood Pressure (Exit)  100/72    Heart Rate (Admit)  77 bpm    Heart Rate (Exercise)  90 bpm    Heart Rate (Exit)  77 bpm    Rating of Perceived Exertion (Exercise)  9    Symptoms  none    Duration  Progress to 30 minutes of  aerobic without signs/symptoms of physical distress    Intensity  THRR unchanged      Progression   Progression  Continue to progress workloads to maintain intensity without signs/symptoms of physical distress.    Average METs  3.7      Resistance Training   Training Prescription  Yes    Weight  3 lbs    Reps  10-15    Time  10 Minutes      Interval Training   Interval Training  No      Treadmill   MPH  3.5    Grade  2    Minutes  10    METs  4.65      Bike   Level  1.5    Minutes  10    METs  3.9      NuStep   Level  4    SPM  85    Minutes  10    METs  2.6       Social History   Tobacco Use  Smoking Status Former Smoker  . Packs/day: 1.00  . Years: 43.00  . Pack years: 43.00  . Last attempt to quit: 2015  . Years since quitting: 4.8  Smokeless Tobacco Never Used  Tobacco Comment   Former smoker quit 2015    Goals Met:  Exercise tolerated well  Goals Unmet:  Not Applicable  Comments: Terry Wright started cardiac rehab today.  Pt tolerated light exercise without difficulty. VSS, telemetry-Sinus Rhythm with PVC's frequent at times intermittent bigeminey, asymptomatic.  Medication list  reconciled. Pt denies barriers to medicaiton compliance.  PSYCHOSOCIAL ASSESSMENT:  PHQ-0. Pt exhibits positive coping skills, hopeful outlook with supportive family. No psychosocial needs identified at this time, no psychosocial interventions necessary.    Pt enjoys doing yardwork.   Pt oriented to exercise equipment and routine.    Understanding verbalized.Barnet Pall, RN,BSN 05/27/2018 4:56 PM   Dr. Fransico Him is Medical Director for Cardiac Rehab at [facility].

## 2018-05-28 NOTE — Progress Notes (Signed)
Cardiac Individual Treatment Plan  Patient Details  Name: Terry Wright MRN: [id number] Date of Birth: [birth year] Referring Provider:     [place] from 05/21/2018 in [place]  Referring Provider  [name], MD      Initial Encounter Date:    CARDIAC REHAB PHASE II ORIENTATION from 05/21/2018 in [facility]  Date  05/21/18      Visit Diagnosis: S/P coronary artery stent placement 03/27/18 S/P DES OM3  ST elevation myocardial infarction involving left circumflex coronary artery ([place])  Patient's Home Medications on Admission:  Current Outpatient Medications:  .  acetaminophen (TYLENOL) 500 MG tablet, Take 1,000 mg by mouth every 6 (six) hours as needed for headache (pain)., Disp: , Rfl:  .  aspirin EC 81 MG EC tablet, Take 1 tablet (81 mg total) by mouth daily., Disp: , Rfl:  .  carvedilol (COREG) 6.25 MG tablet, TAKE 2 TABLETS BY MOUTH  EVERY MORNING AND 1 TABLET  EVERY EVENING., Disp: 270 tablet, Rfl: 1 .  clopidogrel (PLAVIX) 75 MG tablet, Take 75 mg by mouth daily., Disp: , Rfl:  .  LORazepam (ATIVAN) 0.5 MG tablet, Take 0.5 mg by mouth 2 (two) times daily. , Disp: , Rfl:  .  nitroGLYCERIN (NITROSTAT) 0.4 MG SL tablet, Place 1 tablet (0.4 mg total) under the tongue every 5 (five) minutes as needed for chest pain., Disp: 25 tablet, Rfl: 1 .  pantoprazole (PROTONIX) 40 MG tablet, Take 1 tablet (40 mg total) by mouth daily., Disp: 90 tablet, Rfl: 3 .  rosuvastatin (CRESTOR) 20 MG tablet, TAKE 1 TABLET BY MOUTH  DAILY, Disp: 90 tablet, Rfl: 1  Past Medical History: Past Medical History:  Diagnosis Date  . CAD S/P percutaneous coronary angioplasty 07/2016, 02/2018   a) 100% pCx -- DES PCI with 4.0 x 16 mm Synergy DES. 65% ostial OM 3 (relatively small caliber - Med Rx);; b) 03/27/18 PCI/DES to dLCX/OM3 x1. Patent pCX DES. 700% dRCA trifurcation lesion ([facility] Rx). Normal EF  . DVT (deep  venous thrombosis) (HCC)    Right upper arm DVT on 2 occasions in the remote past  . Ejection fraction    LV function normal, echo, February, 2010  . GERD (gastroesophageal reflux disease)   . Hyperlipidemia   . Hypertension   . Mitral regurgitation    Mild, echo, February, 2010  . Palpitations    Mild in the past  . Statin intolerance    Felt poorly after Lipitor, Crestor, TriCor  . STEMI involving left circumflex coronary artery ([place]) 08/04/2016   Occluded very large caliber codominant circumflex - PCI with single Synergy DES  . Tobacco abuse     Tobacco Use: Social History   Tobacco Use  Smoking Status Former Smoker  . Packs/day: 1.00  . Years: 43.00  . Pack years: 43.00  . Last attempt to quit: 2015  . Years since quitting: 4.8  Smokeless Tobacco Never Used  Tobacco Comment   Former smoker quit 2015    Labs: Recent Review Flowsheet Data    Labs for ITP Cardiac and Pulmonary Rehab Latest Ref Rng & Units 12/20/2011 08/04/2016 11/03/2016 05/09/2017 03/27/2018   Cholestrol 0 - 200 mg/dL 181 - 101 110 106   LDLCALC 0 - 99 mg/dL 110(H) - 50 46 56   LDLDIRECT mg/dL - - - - -   HDL >40 mg/dL 35.30(L) - 29(L) 35(L) 30(L)   Trlycerides <150 mg/dL 180.0(H) -  111 143 99   TCO2 0 - 100 mmol/L - 28 - - -      Capillary Blood Glucose: No results found for: GLUCAP   Exercise Target Goals: Exercise Program Goal: Individual exercise prescription set using results from initial 6 min walk test and THRR while considering  patient's activity barriers and safety.   Exercise Prescription Goal: Initial exercise prescription builds to 30-45 minutes a day of aerobic activity, 2-3 days per week.  Home exercise guidelines will be given to patient during program as part of exercise prescription that the participant will acknowledge.  Activity Barriers & Risk Stratification: Activity Barriers & Cardiac Risk Stratification - 05/21/18 1533      Activity Barriers & Cardiac Risk Stratification    Activity Barriers  Joint Problems   Left Shoulder pain       6 Minute Walk: 6 Minute Walk    Row Name 05/22/18 0808         6 Minute Walk   Distance  1693 feet     Distance % Change  4.1 %     Walk Time  6 minutes     MPH  3.33     METS  3.99     RPE  10     VO2 Peak  13.98     Symptoms  No     Resting HR  63 bpm     Resting BP  116/78     Resting Oxygen Saturation   96 %     Exercise Oxygen Saturation  during 6 min walk  97 %     Max Ex. HR  82 bpm     Max Ex. BP  128/76     2 Minute Post BP  108/70        Oxygen Initial Assessment:   Oxygen Re-Evaluation:   Oxygen Discharge (Final Oxygen Re-Evaluation):   Initial Exercise Prescription: Initial Exercise Prescription - 05/21/18 1500      Date of Initial Exercise RX and Referring Provider   Date  05/21/18    Referring Provider  [name], MD    Expected Discharge Date  08/30/18      Treadmill   MPH  3.5    Grade  2    Minutes  10    METs  3.8      Bike   Level  1.5    Minutes  10    METs  3.98      NuStep   Level  4    SPM  85    Minutes  10    METs  3.6      Prescription Details   Frequency (times per week)  3    Duration  Progress to 30 minutes of continuous aerobic without signs/symptoms of physical distress      Intensity   THRR 40-80% of Max Heartrate  63-126    Ratings of Perceived Exertion  11-13      Progression   Progression  Continue to progress workloads to maintain intensity without signs/symptoms of physical distress.      Resistance Training   Training Prescription  Yes    Weight  3 lbs    Reps  10-15       Perform Capillary Blood Glucose checks as needed.  Exercise Prescription Changes:  Exercise Prescription Changes    Row Name 05/27/18 1456             Response to Exercise   Blood  Pressure (Admit)  102/62       Blood Pressure (Exercise)  128/80       Blood Pressure (Exit)  100/72       Heart Rate (Admit)  77 bpm       Heart Rate (Exercise)  90  bpm       Heart Rate (Exit)  77 bpm       Rating of Perceived Exertion (Exercise)  9       Symptoms  none       Duration  Progress to 30 minutes of  aerobic without signs/symptoms of physical distress       Intensity  THRR unchanged         Progression   Progression  Continue to progress workloads to maintain intensity without signs/symptoms of physical distress.       Average METs  3.7         Resistance Training   Training Prescription  Yes       Weight  3 lbs       Reps  10-15       Time  10 Minutes         Interval Training   Interval Training  No         Treadmill   MPH  3.5       Grade  2       Minutes  10       METs  4.65         Bike   Level  1.5       Minutes  10       METs  3.9         NuStep   Level  4       SPM  85       Minutes  10       METs  2.6          Exercise Comments:  Exercise Comments    Row Name 05/27/18 1456           Exercise Comments  Patient tolerated 1st session of exericse well. Overhead stretching limited by chronic left shoulder pain.          Exercise Goals and Review:  Exercise Goals    Row Name 05/21/18 1542             Exercise Goals   Increase Physical Activity  Yes       Intervention  Develop an individualized exercise prescription for aerobic and resistive training based on initial evaluation findings, risk stratification, comorbidities and participant's personal goals.;Provide advice, education, support and counseling about physical activity/exercise needs.       Expected Outcomes  Short Term: Attend rehab on a regular basis to increase amount of physical activity.       Increase Strength and Stamina  Yes       Intervention  Provide advice, education, support and counseling about physical activity/exercise needs.;Develop an individualized exercise prescription for aerobic and resistive training based on initial evaluation findings, risk stratification, comorbidities and participant's personal goals.        Expected Outcomes  Short Term: Increase workloads from initial exercise prescription for resistance, speed, and METs.       Able to understand and use rate of perceived exertion (RPE) scale  Yes       Intervention  Provide education and explanation on how to use RPE scale       Expected Outcomes  Short Term: Able to use RPE daily in rehab to express subjective intensity level;Long Term:  Able to use RPE to guide intensity level when exercising independently       Intervention  Provide education and explanation of THRR including how the numbers were predicted and where they are located for reference       Expected Outcomes  Short Term: Able to state/look up THRR;Long Term: Able to use THRR to govern intensity when exercising independently;Short Term: Able to use daily as guideline for intensity in rehab       Able to check pulse independently  Yes       Intervention  Provide education and demonstration on how to check pulse in carotid and radial arteries.;Review the importance of being able to check your own pulse for safety during independent exercise       Expected Outcomes  Short Term: Able to explain why pulse checking is important during independent exercise;Long Term: Able to check pulse independently and accurately       Understanding of Exercise Prescription  Yes       Intervention  Provide education, explanation, and written materials on patient's individual exercise prescription       Expected Outcomes  Short Term: Able to explain program exercise prescription;Long Term: Able to explain home exercise prescription to exercise independently          Exercise Goals Re-Evaluation : Exercise Goals Re-Evaluation    Row Name 05/27/18 1456             Exercise Goal Re-Evaluation   Exercise Goals Review  Able to understand and use rate of perceived exertion (RPE) scale;Increase Physical Activity       Comments  Patient able to understand and use RPE scale appropriately.       Expected  Outcomes  [place] workloads as tolerted to help improve strength and stamina.          Discharge Exercise Prescription (Final Exercise Prescription Changes): Exercise Prescription Changes - 05/27/18 1456      Response to Exercise   Blood Pressure (Admit)  102/62    Blood Pressure (Exercise)  128/80    Blood Pressure (Exit)  100/72    Heart Rate (Admit)  77 bpm    Heart Rate (Exercise)  90 bpm    Heart Rate (Exit)  77 bpm    Rating of Perceived Exertion (Exercise)  9    Symptoms  none    Duration  Progress to 30 minutes of  aerobic without signs/symptoms of physical distress    Intensity  THRR unchanged      Progression   Progression  Continue to progress workloads to maintain intensity without signs/symptoms of physical distress.    Average METs  3.7      Resistance Training   Training Prescription  Yes    Weight  3 lbs    Reps  10-15    Time  10 Minutes      Interval Training   Interval Training  No      Treadmill   MPH  3.5    Grade  2    Minutes  10    METs  4.65      Bike   Level  1.5    Minutes  10    METs  3.9      NuStep   Level  4    SPM  85    Minutes  10    METs  2.6  Nutrition:  Target Goals: Understanding of nutrition guidelines, daily intake of sodium 1500mg , cholesterol 200mg , calories 30% from fat and 7% or less from saturated fats, daily to have 5 or more servings of fruits and vegetables.  Biometrics: Pre Biometrics - 05/21/18 1542      Pre Biometrics   Height  6\' 1"  (1.854 m)    Weight  97 kg    Waist Circumference  44.5 inches    Hip Circumference  43.5 inches    Waist to Hip Ratio  1.02 %    BMI (Calculated)  28.22    Triceps Skinfold  29 mm    % Body Fat  32 %    Grip Strength  49 kg    Flexibility  0 in    Single Leg Stand  30 seconds        Nutrition Therapy Plan and Nutrition Goals:   Nutrition Assessments:   Nutrition Goals Re-Evaluation:   Nutrition Goals Re-Evaluation:   Nutrition Goals Discharge  (Final Nutrition Goals Re-Evaluation):   Psychosocial: Target Goals: Acknowledge presence or absence of significant depression and/or stress, maximize coping skills, provide positive support system. Participant is able to verbalize types and ability to use techniques and skills needed for reducing stress and depression.  Initial Review & Psychosocial Screening: Initial Psych Review & Screening - 05/21/18 1545      Initial Review   Current issues with  None Identified      Family Dynamics   Good Support System?  Yes   [name] has his wife for support     Barriers   Psychosocial barriers to participate in program  There are no identifiable barriers or psychosocial needs.      Screening Interventions   Interventions  Provide feedback about the scores to participant       Quality of Life Scores: Quality of Life - 05/21/18 1526      Quality of Life   Select  Quality of Life      Quality of Life Scores   Health/Function Pre  25.85 %    Socioeconomic Pre  24 %    Psych/Spiritual Pre  24 %    Family Pre  24 %    GLOBAL Pre  24.77 %      Scores of 19 and below usually indicate a poorer quality of life in these areas.  A difference of  2-3 points is a clinically meaningful difference.  A difference of 2-3 points in the total score of the Quality of Life Index has been associated with significant improvement in overall quality of life, self-image, physical symptoms, and general health in studies assessing change in quality of life.  PHQ-9: Recent Review Flowsheet Data    Depression screen [facility] 2/9 11/20/2016 08/28/2016   Decreased Interest 0 0   Down, Depressed, Hopeless 0 0   PHQ - 2 Score 0 0     Interpretation of Total Score  Total Score Depression Severity:  1-4 = Minimal depression, 5-9 = Mild depression, 10-14 = Moderate depression, 15-19 = Moderately severe depression, 20-27 = Severe depression   Psychosocial Evaluation and Intervention:   Psychosocial  Re-Evaluation:   Psychosocial Discharge (Final Psychosocial Re-Evaluation):   Vocational Rehabilitation: Provide vocational rehab assistance to qualifying candidates.   Vocational Rehab Evaluation & Intervention: Vocational Rehab - 05/21/18 1546      Initial Vocational Rehab Evaluation & Intervention   Assessment shows need for Vocational Rehabilitation  No   [name] works part time and  does not need vocational rehab at this time      Education: Education Goals: Education classes will be provided on a weekly basis, covering required topics. Participant will state understanding/return demonstration of topics presented.  Learning Barriers/Preferences: Learning Barriers/Preferences - 05/21/18 1527      Learning Barriers/Preferences   Learning Preferences  Written Material;Verbal Instruction;Pictoral;Audio;Video;Skilled Demonstration       Education Topics: Count Your Pulse:  -Group instruction provided by verbal instruction, demonstration, patient participation and written materials to support subject.  Instructors address importance of being able to find your pulse and how to count your pulse when at home without a heart monitor.  Patients get hands on experience counting their pulse with staff help and individually.   Heart Attack, Angina, and Risk Factor Modification:  -Group instruction provided by verbal instruction, video, and written materials to support subject.  Instructors address signs and symptoms of angina and heart attacks.    Also discuss risk factors for heart disease and how to make changes to improve heart health risk factors.   CARDIAC REHAB PHASE II EXERCISE from 11/08/2016 in [place]  Date  10/25/16  Instruction Review Code (Retired)  2- meets goals/outcomes      Functional Fitness:  -Group instruction provided by verbal instruction, demonstration, patient participation, and written materials to support subject.  Instructors  address safety measures for doing things around the house.  Discuss how to get up and down off the floor, how to pick things up properly, how to safely get out of a chair without assistance, and balance training.   CARDIAC REHAB PHASE II EXERCISE from 11/08/2016 in [place]  Date  10/13/16  Instruction Review Code (Retired)  2- Media planner and Mindfulness:  -Group instruction provided by verbal instruction, patient participation, and written materials to support subject.  Instructor addresses importance of mindfulness and meditation practice to help reduce stress and improve awareness.  Instructor also leads participants through a meditation exercise.    CARDIAC REHAB PHASE II EXERCISE from 11/08/2016 in [place]  Date  09/20/16  Instruction Review Code (Retired)  2- meets Insurance account manager for Flexibility and Mobility:  -Group instruction provided by verbal instruction, patient participation, and written materials to support subject.  Instructors lead participants through series of stretches that are designed to increase flexibility thus improving mobility.  These stretches are additional exercise for major muscle groups that are typically performed during regular warm up and cool down.   CARDIAC REHAB PHASE II EXERCISE from 11/08/2016 in [place]  Date  10/20/16  Instruction Review Code (Retired)  R- Review/reinforce      Hands Only CPR:  -Group verbal, video, and participation provides a basic overview of AHA guidelines for community CPR. Role-play of emergencies allow participants the opportunity to practice calling for help and chest compression technique with discussion of AED use.   Hypertension: -Group verbal and written instruction that provides a basic overview of hypertension including the most recent diagnostic guidelines, risk factor  reduction with self-care instructions and medication management.    Nutrition I class: Heart Healthy Eating:  -Group instruction provided by PowerPoint slides, verbal discussion, and written materials to support subject matter. The instructor gives an explanation and review of the Therapeutic Lifestyle Changes diet recommendations, which includes a discussion on lipid goals, dietary fat, sodium, fiber, plant stanol/sterol  esters, sugar, and the components of a well-balanced, healthy diet.   CARDIAC REHAB PHASE II EXERCISE from 11/08/2016 in [place]  Date  09/05/16  Educator  RD  Instruction Review Code (Retired)  2- meets goals/outcomes      Nutrition II class: Lifestyle Skills:  -Group instruction provided by [employer], verbal discussion, and written materials to support subject matter. The instructor gives an explanation and review of label reading, grocery shopping for heart health, heart healthy recipe modifications, and ways to make healthier choices when eating out.   Diabetes Question & Answer:  -Group instruction provided by PowerPoint slides, verbal discussion, and written materials to support subject matter. The instructor gives an explanation and review of diabetes co-morbidities, pre- and post-prandial blood glucose goals, pre-exercise blood glucose goals, signs, symptoms, and treatment of hypoglycemia and hyperglycemia, and foot care basics.   CARDIAC REHAB PHASE II EXERCISE from 11/08/2016 in [place]  Date  11/03/16  Instruction Review Code (Retired)  2- meets goals/outcomes      Diabetes Blitz:  -Group instruction provided by [employer], verbal discussion, and written materials to support subject matter. The instructor gives an explanation and review of the physiology behind type 1 and type 2 diabetes, diabetes medications and rational behind using different medications, pre- and post-prandial  blood glucose recommendations and Hemoglobin A1c goals, diabetes diet, and exercise including blood glucose guidelines for exercising safely.    Portion Distortion:  -Group instruction provided by PowerPoint slides, verbal discussion, written materials, and food models to support subject matter. The instructor gives an explanation of serving size versus portion size, changes in portions sizes over the last 20 years, and what consists of a serving from each food group.   CARDIAC REHAB PHASE II EXERCISE from 11/08/2016 in [place]  Date  09/27/16  Educator  RD  Instruction Review Code (Retired)  2- meets goals/outcomes      Stress Management:  -Group instruction provided by verbal instruction, video, and written materials to support subject matter.  Instructors review role of stress in heart disease and how to cope with stress positively.     CARDIAC REHAB PHASE II EXERCISE from 11/08/2016 in [place]  Date  10/04/16  Instruction Review Code (Retired)  2- meets goals/outcomes      Exercising on Your Own:  -Group instruction provided by verbal instruction, power point, and written materials to support subject.  Instructors discuss benefits of exercise, components of exercise, frequency and intensity of exercise, and end points for exercise.  Also discuss use of nitroglycerin and activating EMS.  Review options of places to exercise outside of rehab.  Review guidelines for sex with heart disease.   CARDIAC REHAB PHASE II EXERCISE from 11/08/2016 in [place]  Date  08/30/16  Educator  Cleda Mccreedy  Instruction Review Code (Retired)  2- meets goals/outcomes      Cardiac Drugs I:  -Group instruction provided by verbal instruction and written materials to support subject.  Instructor reviews cardiac drug classes: antiplatelets, anticoagulants, beta blockers, and statins.  Instructor discusses  reasons, side effects, and lifestyle considerations for each drug class.   CARDIAC REHAB PHASE II EXERCISE from 11/08/2016 in [place]  Date  11/08/16  Instruction Review Code (Retired)  R- Review/reinforce      Cardiac Drugs II:  -Group instruction provided by verbal instruction and  written materials to support subject.  Instructor reviews cardiac drug classes: angiotensin converting enzyme inhibitors (ACE-I), angiotensin II receptor blockers (ARBs), nitrates, and calcium channel blockers.  Instructor discusses reasons, side effects, and lifestyle considerations for each drug class.   Anatomy and Physiology of the Circulatory System:  Group verbal and written instruction and models provide basic cardiac anatomy and physiology, with the coronary electrical and arterial systems. Review of: AMI, Angina, Valve disease, Heart Failure, Peripheral Artery Disease, Cardiac Arrhythmia, Pacemakers, and the ICD.   CARDIAC REHAB PHASE II EXERCISE from 11/08/2016 in [place]  Date  11/01/16  Instruction Review Code (Retired)  2- meets goals/outcomes      Other Education:  -Group or individual verbal, written, or video instructions that support the educational goals of the cardiac rehab program.   Holiday Eating Survival Tips:  -Group instruction provided by PowerPoint slides, verbal discussion, and written materials to support subject matter. The instructor gives patients tips, tricks, and techniques to help them not only survive but enjoy the holidays despite the onslaught of food that accompanies the holidays.   Knowledge Questionnaire Score: Knowledge Questionnaire Score - 05/21/18 1526      Knowledge Questionnaire Score   Pre Score  21/24       Core Components/Risk Factors/Patient Goals at Admission: Personal Goals and Risk Factors at Admission - 05/21/18 1528      Core Components/Risk Factors/Patient Goals on Admission     Weight Management  Yes;Weight Maintenance;Weight Loss    Intervention  Weight Management: Develop a combined nutrition and exercise program designed to reach desired caloric intake, while maintaining appropriate intake of nutrient and fiber, sodium and fats, and appropriate energy expenditure required for the weight goal.;Weight Management: Provide education and appropriate resources to help participant work on and attain dietary goals.    Admit Weight  213 lb 13.5 oz (97 kg)    Expected Outcomes  Short Term: Continue to assess and modify interventions until short term weight is achieved;Long Term: Adherence to nutrition and physical activity/exercise program aimed toward attainment of established weight goal;Weight Maintenance: Understanding of the daily nutrition guidelines, which includes 25-35% calories from fat, 7% or less cal from saturated fats, less than 200mg  cholesterol, less than 1.5gm of sodium, & 5 or more servings of fruits and vegetables daily;Weight Loss: Understanding of general recommendations for a balanced deficit meal plan, which promotes 1-2 lb weight loss per week and includes a negative energy balance of [id number] kcal/d;Understanding recommendations for meals to include 15-35% energy as protein, 25-35% energy from fat, 35-60% energy from carbohydrates, less than 200mg  of dietary cholesterol, 20-35 gm of total fiber daily;Understanding of distribution of calorie intake throughout the day with the consumption of 4-5 meals/snacks    Hypertension  Yes    Intervention  Provide education on lifestyle modifcations including regular physical activity/exercise, weight management, moderate sodium restriction and increased consumption of fresh fruit, vegetables, and low fat dairy, alcohol moderation, and smoking cessation.;Monitor prescription use compliance.    Expected Outcomes  Short Term: Continued assessment and intervention until BP is < 140/50mm HG in hypertensive participants. < 130/63mm  HG in hypertensive participants with diabetes, heart failure or chronic kidney disease.;Long Term: Maintenance of blood pressure at goal levels.    Lipids  Yes    Intervention  Provide education and support for participant on nutrition & aerobic/resistive exercise along with prescribed medications to achieve LDL 70mg , HDL >40mg .    Expected Outcomes  Short Term: Participant states  understanding of desired cholesterol values and is compliant with medications prescribed. Participant is following exercise prescription and nutrition guidelines.;Long Term: Cholesterol controlled with medications as prescribed, with individualized exercise RX and with personalized nutrition plan. Value goals: LDL < 70mg , HDL > 40 mg.       Core Components/Risk Factors/Patient Goals Review:    Core Components/Risk Factors/Patient Goals at Discharge (Final Review):    ITP Comments: ITP Comments    Row Name 05/21/18 1506 05/28/18 1410         ITP Comments  Dr. Fransico Him, MD  30 Day ITP Review. Chuck tolerated his first day of exercise without difficulty         Comments: See ITP comments.Barnet Pall, RN,BSN 05/30/2018 2:41 PM

## 2018-05-29 ENCOUNTER — Encounter (HOSPITAL_COMMUNITY): Payer: 59

## 2018-05-29 ENCOUNTER — Encounter (HOSPITAL_COMMUNITY)
Admission: RE | Admit: 2018-05-29 | Discharge: 2018-05-29 | Disposition: A | Discharge status: Home / Self Care | Payer: 59 | Source: Ambulatory Visit | Attending: Cardiology | Admitting: Cardiology

## 2018-05-29 DIAGNOSIS — Z955 Presence of coronary angioplasty implant and graft: Secondary | ICD-10-CM | POA: Diagnosis not present

## 2018-05-29 DIAGNOSIS — I2121 ST elevation (STEMI) myocardial infarction involving left circumflex coronary artery: Secondary | ICD-10-CM

## 2018-05-30 ENCOUNTER — Ambulatory Visit (INDEPENDENT_AMBULATORY_CARE_PROVIDER_SITE_OTHER)
Admission: RE | Admit: 2018-05-30 | Discharge: 2018-05-30 | Disposition: A | Discharge status: Home / Self Care | Payer: 59 | Source: Ambulatory Visit | Attending: Acute Care | Admitting: Acute Care

## 2018-05-30 DIAGNOSIS — Z122 Encounter for screening for malignant neoplasm of respiratory organs: Secondary | ICD-10-CM

## 2018-05-30 DIAGNOSIS — Z87891 Personal history of nicotine dependence: Secondary | ICD-10-CM | POA: Diagnosis not present

## 2018-05-30 IMAGING — CT CT CHEST LUNG CANCER SCREENING LOW DOSE W/O CM
2 of 5 series · 15 of 40 positions shown, 18 images · non-contrast
Comparison: 03/25/2018 chest CT angiogram. 05/18/2017 screening
chest CT.

CLINICAL DATA: 62-year-old asymptomatic male former smoker with 43
pack-year smoking history, quit smoking in 4229.

EXAM:
CT CHEST WITHOUT CONTRAST LOW-DOSE FOR LUNG CANCER SCREENING
TECHNIQUE: Multidetector CT imaging of the chest was performed following the
standard protocol without IV contrast.

[Series 3: lung thins 1.0 · axial · 0.80mm/px · z∈[-340,-46]mm · 12 of 328 slices shown, 15 images]
[im 17/328  mediastinal]
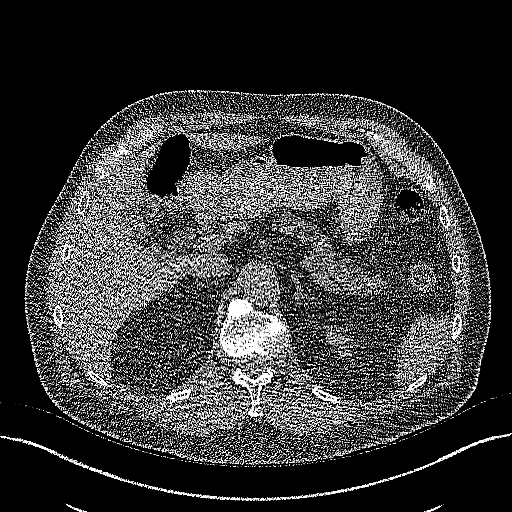
[im 17/328  lung]
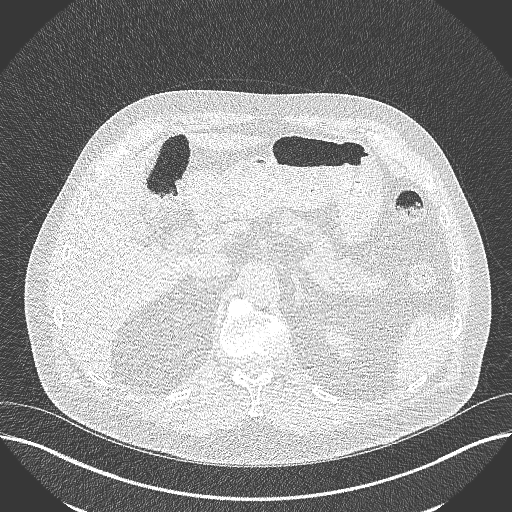
[im 50/328  lung]
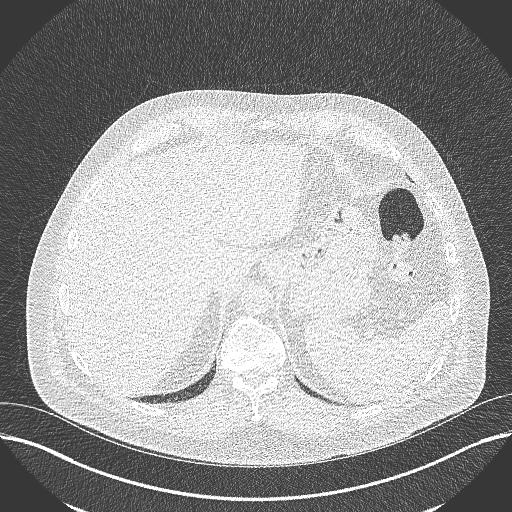
[im 66/328  lung]
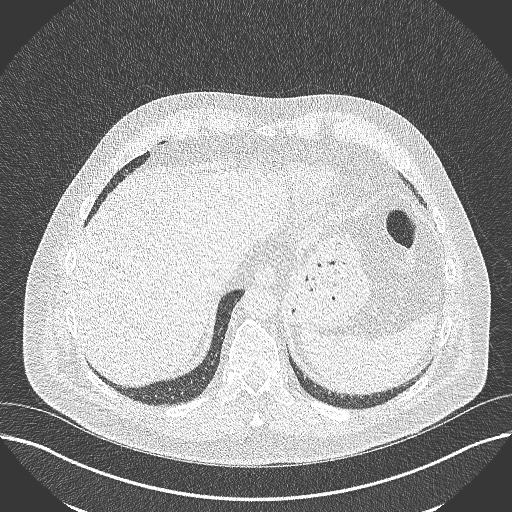
[im 99/328  lung]
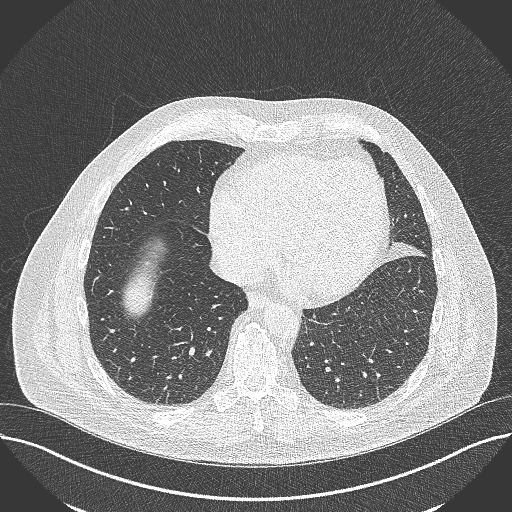
[im 131/328  mediastinal]
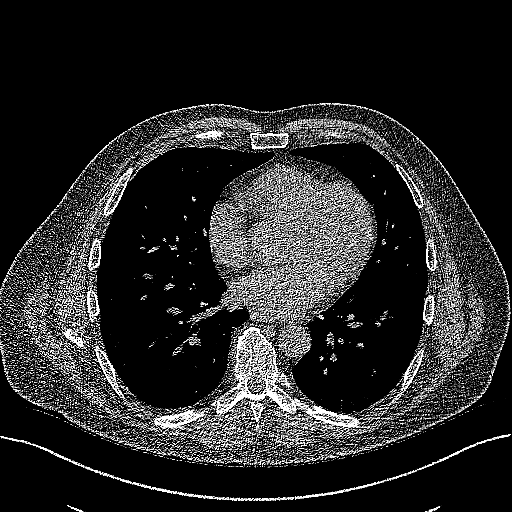
[im 131/328  lung]
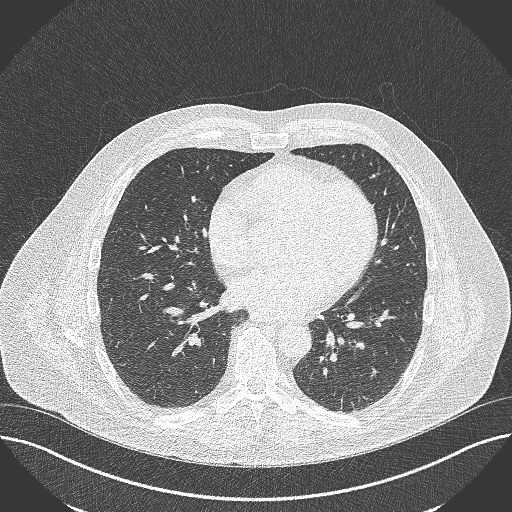
[im 148/328  lung]
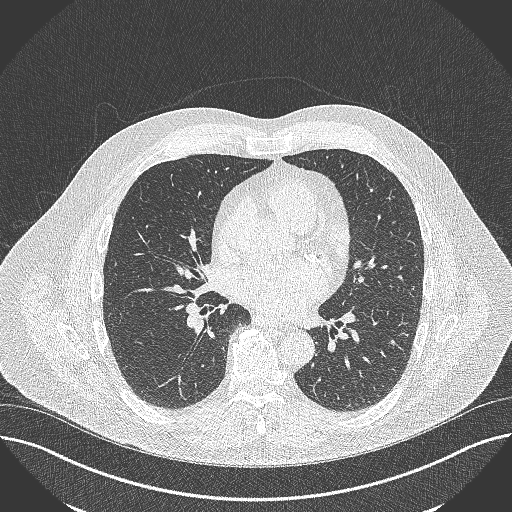
[im 180/328  lung]
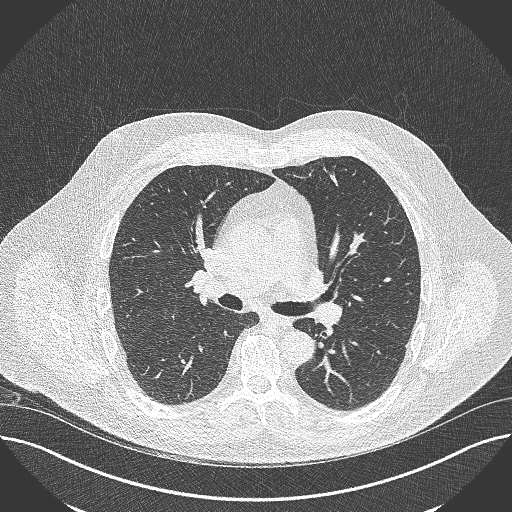
[im 197/328  lung]
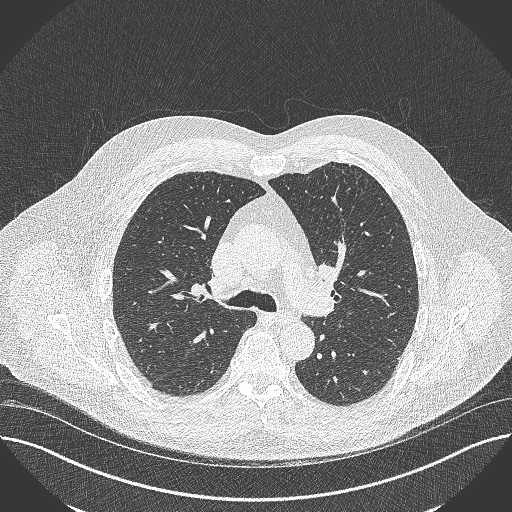
[im 229/328  mediastinal]
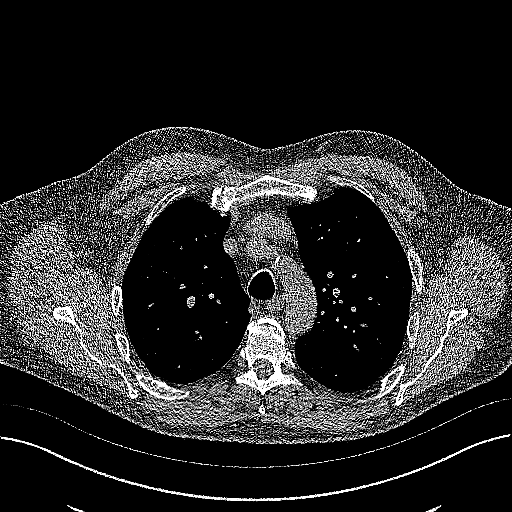
[im 229/328  lung]
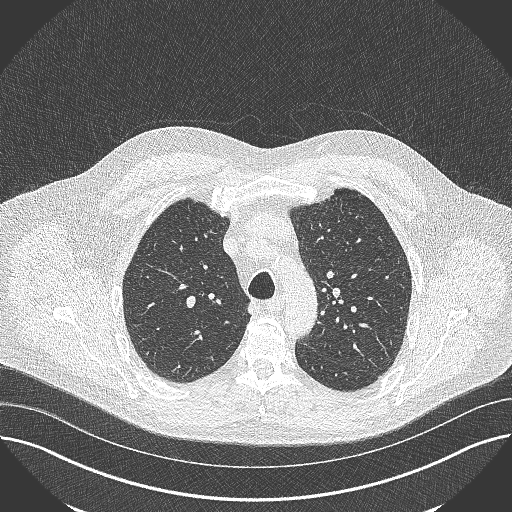
[im 262/328  lung]
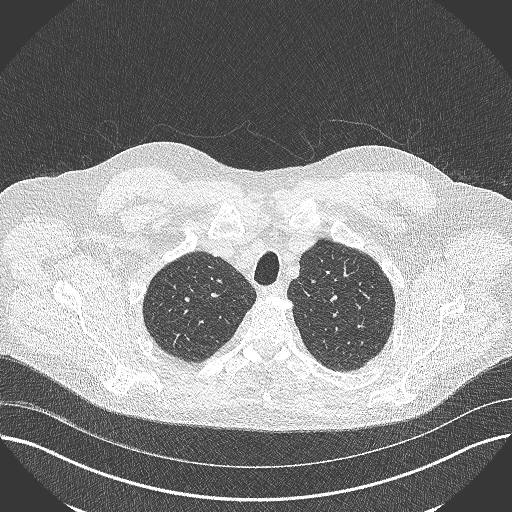
[im 278/328  lung]
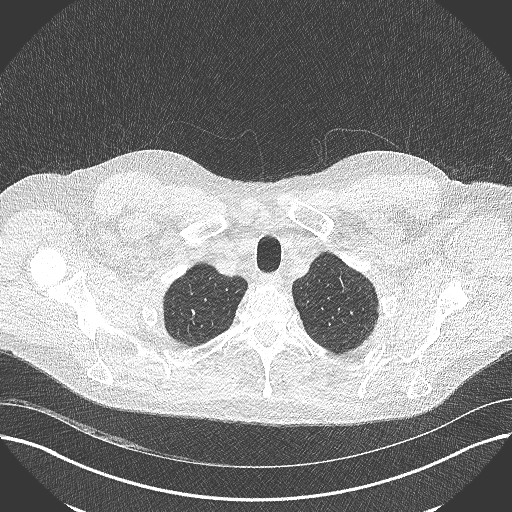
[im 311/328  lung]
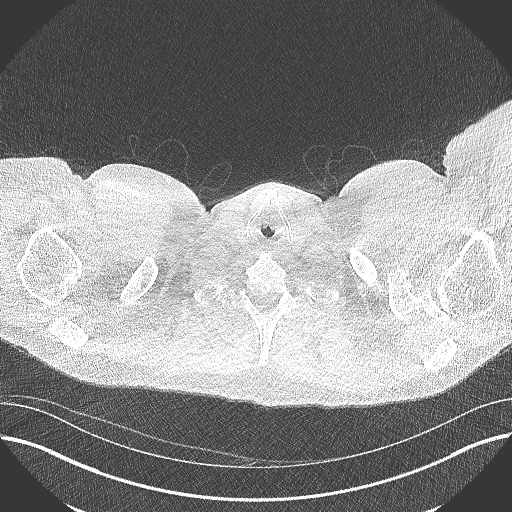

[Series 5: coronal · coronal · 0.65mm/px · 3 of 141 slices shown]
[im 29/141  lung]
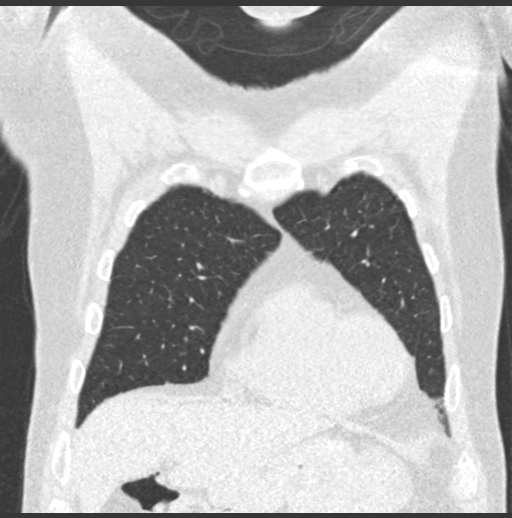
[im 57/141  lung]
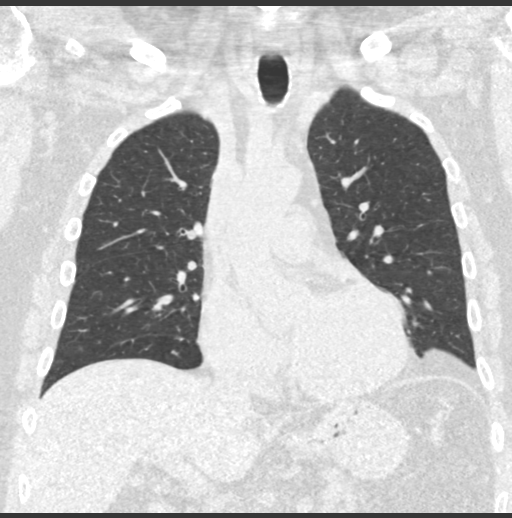
[im 85/141  lung]
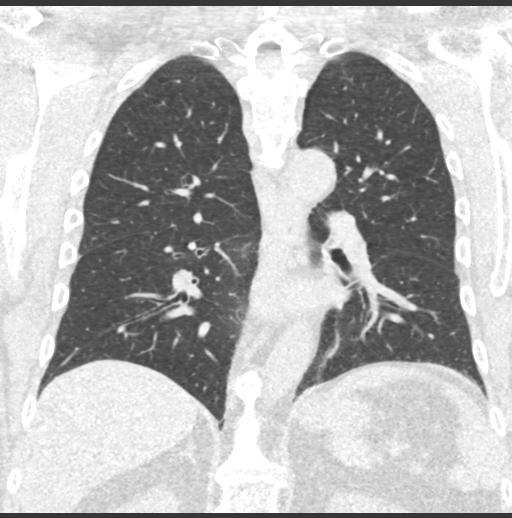

[15 of 40 positions shown; findings below may reference images not displayed]

FINDINGS: Cardiovascular: Normal heart size. No significant pericardial
effusion/thickening. Left circumflex coronary atherosclerosis.
Atherosclerotic nonaneurysmal thoracic aorta. Normal caliber
pulmonary arteries.

Mediastinum/Nodes: No discrete thyroid nodules. Unremarkable
esophagus. No pathologically enlarged axillary, mediastinal or hilar
lymph nodes, noting limited sensitivity for the detection of hilar
adenopathy on this noncontrast study.

Lungs/Pleura: No pneumothorax. No pleural effusion. No acute
consolidative airspace disease or lung masses. Mild centrilobular
and paraseptal emphysema. No significant growth of previously
visualized tiny perifissural left pulmonary nodule. No new
significant pulmonary nodules.

Upper abdomen: No acute abnormality.

Musculoskeletal: No aggressive appearing focal osseous lesions.
Moderate thoracic spondylosis.
IMPRESSION: 1. Lung-RADS 2, benign appearance or behavior. Continue annual
screening with low-dose chest CT without contrast in 12 months.
2. One vessel coronary atherosclerosis.

Aortic Atherosclerosis (WG44K-[id number].N) and Emphysema ([id number].N).

## 2018-05-31 ENCOUNTER — Encounter (HOSPITAL_COMMUNITY)
Admission: RE | Admit: 2018-05-31 | Discharge: 2018-05-31 | Disposition: A | Discharge status: Home / Self Care | Payer: 59 | Source: Ambulatory Visit | Attending: Cardiology | Admitting: Cardiology

## 2018-05-31 ENCOUNTER — Encounter (HOSPITAL_COMMUNITY): Payer: 59

## 2018-05-31 DIAGNOSIS — I2510 Atherosclerotic heart disease of native coronary artery without angina pectoris: Secondary | ICD-10-CM | POA: Insufficient documentation

## 2018-05-31 DIAGNOSIS — I252 Old myocardial infarction: Secondary | ICD-10-CM | POA: Diagnosis not present

## 2018-05-31 DIAGNOSIS — Z955 Presence of coronary angioplasty implant and graft: Secondary | ICD-10-CM | POA: Diagnosis not present

## 2018-05-31 DIAGNOSIS — I2121 ST elevation (STEMI) myocardial infarction involving left circumflex coronary artery: Secondary | ICD-10-CM

## 2018-05-31 NOTE — Progress Notes (Signed)
Reviewed home exercise guidelines with patient including endpoints, temperature precautions, target heart rate and rate of perceived exertion. Pt has a treadmill at home and exercises [place] [employer] as his mode of home exercise. Pt is exercising 30-35 minutes, 6-7 days/week. Pt voices understanding of instructions given. Sol Passer, MS, ACSM CEP

## 2018-06-03 ENCOUNTER — Encounter (HOSPITAL_COMMUNITY): Payer: 59

## 2018-06-03 ENCOUNTER — Other Ambulatory Visit: Payer: Self-pay | Admitting: Acute Care

## 2018-06-03 ENCOUNTER — Encounter (HOSPITAL_COMMUNITY)
Admission: RE | Admit: 2018-06-03 | Discharge: 2018-06-03 | Disposition: A | Discharge status: Home / Self Care | Payer: 59 | Source: Ambulatory Visit | Attending: Cardiology | Admitting: Cardiology

## 2018-06-03 DIAGNOSIS — Z955 Presence of coronary angioplasty implant and graft: Secondary | ICD-10-CM | POA: Diagnosis not present

## 2018-06-03 DIAGNOSIS — Z87891 Personal history of nicotine dependence: Secondary | ICD-10-CM

## 2018-06-03 DIAGNOSIS — I2121 ST elevation (STEMI) myocardial infarction involving left circumflex coronary artery: Secondary | ICD-10-CM

## 2018-06-03 DIAGNOSIS — Z122 Encounter for screening for malignant neoplasm of respiratory organs: Secondary | ICD-10-CM

## 2018-06-05 ENCOUNTER — Encounter (HOSPITAL_COMMUNITY): Payer: 59

## 2018-06-05 ENCOUNTER — Encounter (HOSPITAL_COMMUNITY)
Admission: RE | Admit: 2018-06-05 | Discharge: 2018-06-05 | Disposition: A | Discharge status: Home / Self Care | Payer: 59 | Source: Ambulatory Visit | Attending: Cardiology | Admitting: Cardiology

## 2018-06-05 DIAGNOSIS — Z955 Presence of coronary angioplasty implant and graft: Secondary | ICD-10-CM

## 2018-06-05 DIAGNOSIS — I2121 ST elevation (STEMI) myocardial infarction involving left circumflex coronary artery: Secondary | ICD-10-CM

## 2018-06-07 ENCOUNTER — Encounter (HOSPITAL_COMMUNITY): Payer: 59

## 2018-06-07 ENCOUNTER — Encounter (HOSPITAL_COMMUNITY)
Admission: RE | Admit: 2018-06-07 | Discharge: 2018-06-07 | Disposition: A | Discharge status: Home / Self Care | Payer: 59 | Source: Ambulatory Visit | Attending: Cardiology | Admitting: Cardiology

## 2018-06-07 DIAGNOSIS — I2121 ST elevation (STEMI) myocardial infarction involving left circumflex coronary artery: Secondary | ICD-10-CM

## 2018-06-07 DIAGNOSIS — Z955 Presence of coronary angioplasty implant and graft: Secondary | ICD-10-CM | POA: Diagnosis not present

## 2018-06-10 ENCOUNTER — Encounter (HOSPITAL_COMMUNITY): Payer: 59

## 2018-06-10 ENCOUNTER — Telehealth (HOSPITAL_COMMUNITY): Payer: Self-pay | Admitting: Family Medicine

## 2018-06-12 ENCOUNTER — Encounter (HOSPITAL_COMMUNITY): Payer: 59

## 2018-06-12 ENCOUNTER — Encounter (HOSPITAL_COMMUNITY)
Admission: RE | Admit: 2018-06-12 | Discharge: 2018-06-12 | Disposition: A | Discharge status: Home / Self Care | Payer: 59 | Source: Ambulatory Visit | Attending: Cardiology | Admitting: Cardiology

## 2018-06-12 DIAGNOSIS — Z955 Presence of coronary angioplasty implant and graft: Secondary | ICD-10-CM | POA: Diagnosis not present

## 2018-06-12 DIAGNOSIS — I2121 ST elevation (STEMI) myocardial infarction involving left circumflex coronary artery: Secondary | ICD-10-CM

## 2018-06-14 ENCOUNTER — Encounter (HOSPITAL_COMMUNITY)
Admission: RE | Admit: 2018-06-14 | Discharge: 2018-06-14 | Disposition: A | Discharge status: Home / Self Care | Payer: 59 | Source: Ambulatory Visit | Attending: Cardiology | Admitting: Cardiology

## 2018-06-14 ENCOUNTER — Encounter (HOSPITAL_COMMUNITY): Payer: 59

## 2018-06-14 DIAGNOSIS — I2510 Atherosclerotic heart disease of native coronary artery without angina pectoris: Secondary | ICD-10-CM | POA: Diagnosis not present

## 2018-06-14 DIAGNOSIS — Z955 Presence of coronary angioplasty implant and graft: Secondary | ICD-10-CM | POA: Diagnosis not present

## 2018-06-14 DIAGNOSIS — I2121 ST elevation (STEMI) myocardial infarction involving left circumflex coronary artery: Secondary | ICD-10-CM

## 2018-06-14 DIAGNOSIS — I252 Old myocardial infarction: Secondary | ICD-10-CM | POA: Diagnosis not present

## 2018-06-17 ENCOUNTER — Encounter (HOSPITAL_COMMUNITY): Payer: 59

## 2018-06-17 ENCOUNTER — Encounter (HOSPITAL_COMMUNITY)
Admission: RE | Admit: 2018-06-17 | Discharge: 2018-06-17 | Disposition: A | Discharge status: Home / Self Care | Payer: 59 | Source: Ambulatory Visit | Attending: Cardiology | Admitting: Cardiology

## 2018-06-17 DIAGNOSIS — Z955 Presence of coronary angioplasty implant and graft: Secondary | ICD-10-CM | POA: Diagnosis not present

## 2018-06-17 DIAGNOSIS — I2121 ST elevation (STEMI) myocardial infarction involving left circumflex coronary artery: Secondary | ICD-10-CM

## 2018-06-19 ENCOUNTER — Encounter (HOSPITAL_COMMUNITY): Payer: 59

## 2018-06-20 NOTE — Progress Notes (Signed)
Cardiac Individual Treatment Plan  Patient Details  Name: Terry Wright MRN: [id number] Date of Birth: [birth year] Referring Provider:     [place] from 05/21/2018 in [facility]  Referring Provider  [name], MD      Initial Encounter Date:    CARDIAC REHAB PHASE II ORIENTATION from 05/21/2018 in [facility]  Date  05/21/18      Visit Diagnosis: S/P coronary artery stent placement 03/27/18 S/P DES OM3  ST elevation myocardial infarction involving left circumflex coronary artery ([place])  Patient's Home Medications on Admission:  Current Outpatient Medications:  .  acetaminophen (TYLENOL) 500 MG tablet, Take 1,000 mg by mouth every 6 (six) hours as needed for headache (pain)., Disp: , Rfl:  .  aspirin EC 81 MG EC tablet, Take 1 tablet (81 mg total) by mouth daily., Disp: , Rfl:  .  carvedilol (COREG) 6.25 MG tablet, TAKE 2 TABLETS BY MOUTH  EVERY MORNING AND 1 TABLET  EVERY EVENING., Disp: 270 tablet, Rfl: 1 .  clopidogrel (PLAVIX) 75 MG tablet, Take 75 mg by mouth daily., Disp: , Rfl:  .  LORazepam (ATIVAN) 0.5 MG tablet, Take 0.5 mg by mouth 2 (two) times daily. , Disp: , Rfl:  .  nitroGLYCERIN (NITROSTAT) 0.4 MG SL tablet, Place 1 tablet (0.4 mg total) under the tongue every 5 (five) minutes as needed for chest pain., Disp: 25 tablet, Rfl: 1 .  pantoprazole (PROTONIX) 40 MG tablet, Take 1 tablet (40 mg total) by mouth daily., Disp: 90 tablet, Rfl: 3 .  rosuvastatin (CRESTOR) 20 MG tablet, TAKE 1 TABLET BY MOUTH  DAILY, Disp: 90 tablet, Rfl: 1  Past Medical History: Past Medical History:  Diagnosis Date  . CAD S/P percutaneous coronary angioplasty 07/2016, 02/2018   a) 100% pCx -- DES PCI with 4.0 x 16 mm Synergy DES. 65% ostial OM 3 (relatively small caliber - Med Rx);; b) 03/27/18 PCI/DES to dLCX/OM3 x1. Patent pCX DES. 700% dRCA trifurcation lesion (Med Rx). Normal EF  . DVT (deep  venous thrombosis) (H[facility])    Right upper arm DVT on 2 occasions in the remote past  . Ejection fraction    LV function normal, echo, February, 2010  . GERD (gastroesophageal reflux disease)   . Hyperlipidemia   . Hypertension   . Mitral regurgitation    Mild, echo, February, 2010  . Palpitations    Mild in the past  . Statin intolerance    Felt poorly after Lipitor, Crestor, TriCor  . STEMI involving left circumflex coronary artery ([place]) 08/04/2016   Occluded very large caliber codominant circumflex - PCI with single Synergy DES  . Tobacco abuse     Tobacco Use: Social History   Tobacco Use  Smoking Status Former Smoker  . Packs/day: 1.00  . Years: 43.00  . Pack years: 43.00  . Last attempt to quit: 2015  . Years since quitting: 4.8  Smokeless Tobacco Never Used  Tobacco Comment   Former smoker quit 2015    Labs: Recent Review Flowsheet Data    Labs for ITP Cardiac and Pulmonary Rehab Latest Ref Rng & Units 12/20/2011 08/04/2016 11/03/2016 05/09/2017 03/27/2018   Cholestrol 0 - 200 mg/dL 181 - 101 110 106   LDLCALC 0 - 99 mg/dL 110(H) - 50 46 56   LDLDIRECT mg/dL - - - - -   HDL >40 mg/dL 35.30(L) - 29(L) 35(L) 30(L)   Trlycerides <150 mg/dL 180.0(H) -  111 143 99   TCO2 0 - 100 mmol/L - 28 - - -      Capillary Blood Glucose: No results found for: GLUCAP   Exercise Target Goals: Exercise Program Goal: Individual exercise prescription set using results from initial 6 min walk test and THRR while considering  patient's activity barriers and safety.   Exercise Prescription Goal: Initial exercise prescription builds to 30-45 minutes a day of aerobic activity, 2-3 days per week.  Home exercise guidelines will be given to patient during program as part of exercise prescription that the participant will acknowledge.  Activity Barriers & Risk Stratification: Activity Barriers & Cardiac Risk Stratification - 05/21/18 1533      Activity Barriers & Cardiac Risk Stratification    Activity Barriers  Joint Problems   Left Shoulder pain       6 Minute Walk: 6 Minute Walk    Row Name 05/22/18 0808         6 Minute Walk   Distance  1693 feet     Distance % Change  4.1 %     Walk Time  6 minutes     MPH  3.33     METS  3.99     RPE  10     VO2 Peak  13.98     Symptoms  No     Resting HR  63 bpm     Resting BP  116/78     Resting Oxygen Saturation   96 %     Exercise Oxygen Saturation  during 6 min walk  97 %     Max Ex. HR  82 bpm     Max Ex. BP  128/76     2 Minute Post BP  108/70        Oxygen Initial Assessment:   Oxygen Re-Evaluation:   Oxygen Discharge (Final Oxygen Re-Evaluation):   Initial Exercise Prescription: Initial Exercise Prescription - 05/21/18 1500      Date of Initial Exercise RX and Referring Provider   Date  05/21/18    Referring Provider  [name], MD    Expected Discharge Date  08/30/18      Treadmill   MPH  3.5    Grade  2    Minutes  10    METs  3.8      Bike   Level  1.5    Minutes  10    METs  3.98      NuStep   Level  4    SPM  85    Minutes  10    METs  3.6      Prescription Details   Frequency (times per week)  3    Duration  Progress to 30 minutes of continuous aerobic without signs/symptoms of physical distress      Intensity   THRR 40-80% of Max Heartrate  63-126    Ratings of Perceived Exertion  11-13      Progression   Progression  Continue to progress workloads to maintain intensity without signs/symptoms of physical distress.      Resistance Training   Training Prescription  Yes    Weight  3 lbs    Reps  10-15       Perform Capillary Blood Glucose checks as needed.  Exercise Prescription Changes: Exercise Prescription Changes    Row Name 05/27/18 1456 06/12/18 1505 06/17/18 1503         Response to Exercise   Blood Pressure (  Admit)  102/62  106/62  120/70     Blood Pressure (Exercise)  128/80  122/68  138/80     Blood Pressure (Exit)  100/72  124/70  124/80      Heart Rate (Admit)  77 bpm  68 bpm  70 bpm     Heart Rate (Exercise)  90 bpm  91 bpm  127 bpm     Heart Rate (Exit)  77 bpm  76 bpm  70 bpm     Rating of Perceived Exertion (Exercise)  9  9  13      Symptoms  none  none  none     Duration  Progress to 30 minutes of  aerobic without signs/symptoms of physical distress  Progress to 30 minutes of  aerobic without signs/symptoms of physical distress  Progress to 30 minutes of  aerobic without signs/symptoms of physical distress     Intensity  THRR unchanged  THRR unchanged  THRR unchanged       Progression   Progression  Continue to progress workloads to maintain intensity without signs/symptoms of physical distress.  Continue to progress workloads to maintain intensity without signs/symptoms of physical distress.  Continue to progress workloads to maintain intensity without signs/symptoms of physical distress.     Average METs  3.7  3.8  4       Resistance Training   Training Prescription  Yes  No Relaxation day, no weights  Yes     Weight  3 lbs  -  9lbs     Reps  10-15  -  10-15     Time  10 Minutes  -  10 Minutes       Interval Training   Interval Training  No  No  No       Treadmill   MPH  3.5  3.5  3.5     Grade  2  2  2      Minutes  10  10  10      METs  4.65  4.65  4.65       Bike   Level  1.5  1.5  1.5     Minutes  10  10  10      METs  3.9  3.87  3.89       NuStep   Level  4  5  5      SPM  85  85  85     Minutes  10  10  10      METs  2.6  2.8  3.4       Home Exercise Plan   Plans to continue exercise at  -  [facility] (comment)  [facility]     Frequency  -  Add 4 additional days to program exercise sessions.  Add 4 additional days to program exercise sessions.     Initial Home Exercises Provided  -  05/31/18  05/31/18        Exercise Comments: Exercise Comments    Row Name 05/27/18 1456 05/31/18 1512 06/14/18 1522 06/17/18 1443     Exercise Comments  Patient tolerated 1st session of  exericse well. Overhead stretching limited by chronic left shoulder pain.  Reviewed home exercise guidelines, METs, and goals with patient.  Reviewed METs with patient.  Reviewed METs and goals with patient.       Exercise Goals and Review: Exercise Goals    Row Name 05/21/18 [id number]  Exercise Goals   Increase Physical Activity  Yes       Intervention  Develop an individualized exercise prescription for aerobic and resistive training based on initial evaluation findings, risk stratification, comorbidities and participant's personal goals.;Provide advice, education, support and counseling about physical activity/exercise needs.       Expected Outcomes  Short Term: Attend rehab on a regular basis to increase amount of physical activity.       Increase Strength and Stamina  Yes       Intervention  Provide advice, education, support and counseling about physical activity/exercise needs.;Develop an individualized exercise prescription for aerobic and resistive training based on initial evaluation findings, risk stratification, comorbidities and participant's personal goals.       Expected Outcomes  Short Term: Increase workloads from initial exercise prescription for resistance, speed, and METs.       Able to understand and use rate of perceived exertion (RPE) scale  Yes       Intervention  Provide education and explanation on how to use RPE scale       Expected Outcomes  Short Term: Able to use RPE daily in rehab to express subjective intensity level;Long Term:  Able to use RPE to guide intensity level when exercising independently       Intervention  Provide education and explanation of THRR including how the numbers were predicted and where they are located for reference       Expected Outcomes  Short Term: Able to state/look up THRR;Long Term: Able to use THRR to govern intensity when exercising independently;Short Term: Able to use daily as guideline for intensity in rehab       Able  to check pulse independently  Yes       Intervention  Provide education and demonstration on how to check pulse in carotid and radial arteries.;Review the importance of being able to check your own pulse for safety during independent exercise       Expected Outcomes  Short Term: Able to explain why pulse checking is important during independent exercise;Long Term: Able to check pulse independently and accurately       Understanding of Exercise Prescription  Yes       Intervention  Provide education, explanation, and written materials on patient's individual exercise prescription       Expected Outcomes  Short Term: Able to explain program exercise prescription;Long Term: Able to explain home exercise prescription to exercise independently          Exercise Goals Re-Evaluation : Exercise Goals Re-Evaluation    Row Name 05/27/18 1456 05/31/18 1512 06/17/18 1443         Exercise Goal Re-Evaluation   Exercise Goals Review  Able to understand and use rate of perceived exertion (RPE) scale;Increase Physical Activity  Able to understand and use rate of perceived exertion (RPE) scale;Increase Physical Activity;Understanding of Exercise Prescription;Able to check pulse independently;Knowledge and understanding of Target Heart Rate Range (THRR)  Able to understand and use rate of perceived exertion (RPE) scale;Increase Physical Activity;Understanding of Exercise Prescription;Able to check pulse independently;Knowledge and understanding of Target Heart Rate Range (THRR)     Comments  Patient able to understand and use RPE scale appropriately.  Reviewed home exericse guidelines with patient including THRR, RPE scale, and endpoints for exercise. 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 Pt does need left shoulder surgery and is limited with weight  lifting because of this.  Patient is walking 30-35 minutes on his TM at home 2-3 days/week. Pt plans to finish up cardiac rehab in mid December and continue exercise on his own at that time.     Expected Outcomes  [place] workloads as tolerted to help improve strength and stamina.  Pt will continue exercise 30-35 minutes 6-7 days/week to help with risk factor reduction and increase cardiorespiratory fitness.  Pt will continue exercise 30-35 minutes 6-7 days/week to help with risk factor reduction and increase cardiorespiratory fitness.        Discharge Exercise Prescription (Final Exercise Prescription Changes): Exercise Prescription Changes - 06/17/18 1503      Response to Exercise   Blood Pressure (Admit)  120/70    Blood Pressure (Exercise)  138/80    Blood Pressure (Exit)  124/80    Heart Rate (Admit)  70 bpm    Heart Rate (Exercise)  127 bpm    Heart Rate (Exit)  70 bpm    Rating of Perceived Exertion (Exercise)  13    Symptoms  none    Duration  Progress to 30 minutes of  aerobic without signs/symptoms of physical distress    Intensity  THRR unchanged      Progression   Progression  Continue to progress workloads to maintain intensity without signs/symptoms of physical distress.    Average METs  4      Resistance Training   Training Prescription  Yes    Weight  9lbs    Reps  10-15    Time  10 Minutes      Interval Training   Interval Training  No      Treadmill   MPH  3.5    Grade  2    Minutes  10    METs  4.65      Bike   Level  1.5    Minutes  10    METs  3.89      NuStep   Level  5    SPM  85    Minutes  10    METs  3.4      Home Exercise Plan   Plans to continue exercise at  [facility] (comment)    Frequency  Add 4 additional days to program exercise sessions.    Initial Home Exercises Provided  05/31/18       Nutrition:  Target Goals: Understanding of nutrition guidelines, daily intake of sodium 1500mg , cholesterol 200mg , calories 30%  from fat and 7% or less from saturated fats, daily to have 5 or more servings of fruits and vegetables.  Biometrics: Pre Biometrics - 05/21/18 1542      Pre Biometrics   Height  6\' 1"  (1.854 m)    Weight  97 kg    Waist Circumference  44.5 inches    Hip Circumference  43.5 inches    Waist to Hip Ratio  1.02 %    BMI (Calculated)  28.22    Triceps Skinfold  29 mm    % Body Fat  32 %    Grip Strength  49 kg    Flexibility  0 in    Single Leg Stand  30 seconds        Nutrition Therapy Plan and Nutrition Goals:   Nutrition Assessments:   Nutrition Goals Re-Evaluation:   Nutrition Goals Re-Evaluation:   Nutrition Goals  Discharge (Final Nutrition Goals Re-Evaluation):   Psychosocial: Target Goals: Acknowledge presence or absence of significant depression and/or stress, maximize coping skills, provide positive support system. Participant is able to verbalize types and ability to use techniques and skills needed for reducing stress and depression.  Initial Review & Psychosocial Screening: Initial Psych Review & Screening - 05/21/18 1545      Initial Review   Current issues with  None Identified      Family Dynamics   Good Support System?  Yes   [name] has his wife for support     Barriers   Psychosocial barriers to participate in program  There are no identifiable barriers or psychosocial needs.      Screening Interventions   Interventions  Provide feedback about the scores to participant       Quality of Life Scores: Quality of Life - 05/21/18 1526      Quality of Life   Select  Quality of Life      Quality of Life Scores   Health/Function Pre  25.85 %    Socioeconomic Pre  24 %    Psych/Spiritual Pre  24 %    Family Pre  24 %    GLOBAL Pre  24.77 %      Scores of 19 and below usually indicate a poorer quality of life in these areas.  A difference of  2-3 points is a clinically meaningful difference.  A difference of 2-3 points in the total score of the  Quality of Life Index has been associated with significant improvement in overall quality of life, self-image, physical symptoms, and general health in studies assessing change in quality of life.  PHQ-9: Recent Review Flowsheet Data    Depression screen [facility] 2/9 11/20/2016 08/28/2016   Decreased Interest 0 0   Down, Depressed, Hopeless 0 0   PHQ - 2 Score 0 0     Interpretation of Total Score  Total Score Depression Severity:  1-4 = Minimal depression, 5-9 = Mild depression, 10-14 = Moderate depression, 15-19 = Moderately severe depression, 20-27 = Severe depression   Psychosocial Evaluation and Intervention:   Psychosocial Re-Evaluation: Psychosocial Re-Evaluation    [place] Name 06/20/18 1403             Psychosocial Re-Evaluation   Current issues with  None Identified;Current Stress Concerns       Interventions  Stress management education;Encouraged to attend Cardiac Rehabilitation for the exercise       Continue Psychosocial Services   No Follow up required          Psychosocial Discharge (Final Psychosocial Re-Evaluation): Psychosocial Re-Evaluation - 06/20/18 1403      Psychosocial Re-Evaluation   Current issues with  None Identified;Current Stress Concerns    Interventions  Stress management education;Encouraged to attend Cardiac Rehabilitation for the exercise    Continue Psychosocial Services   No Follow up required       Vocational Rehabilitation: Provide vocational rehab assistance to qualifying candidates.   Vocational Rehab Evaluation & Intervention: Vocational Rehab - 05/21/18 1546      Initial Vocational Rehab Evaluation & Intervention   Assessment shows need for Vocational Rehabilitation  No   [name] works part time and does not need vocational rehab at this time      Education: Education Goals: Education classes will be provided on a weekly basis, covering required topics. Participant will state understanding/return demonstration of topics  presented.  Learning Barriers/Preferences: Learning Barriers/Preferences - 05/21/18 1527  Learning Barriers/Preferences   Learning Preferences  Written Material;Verbal Instruction;Pictoral;Audio;Video;Skilled Demonstration       Education Topics: Count Your Pulse:  -Group instruction provided by verbal instruction, demonstration, patient participation and written materials to support subject.  Instructors address importance of being able to find your pulse and how to count your pulse when at home without a heart monitor.  Patients get hands on experience counting their pulse with staff help and individually.   Heart Attack, Angina, and Risk Factor Modification:  -Group instruction provided by verbal instruction, video, and written materials to support subject.  Instructors address signs and symptoms of angina and heart attacks.    Also discuss risk factors for heart disease and how to make changes to improve heart health risk factors.   CARDIAC REHAB PHASE II EXERCISE from 11/08/2016 in [facility]  Date  10/25/16  Instruction Review Code (Retired)  2- meets goals/outcomes      Functional Fitness:  -Group instruction provided by verbal instruction, demonstration, patient participation, and written materials to support subject.  Instructors address safety measures for doing things around the house.  Discuss how to get up and down off the floor, how to pick things up properly, how to safely get out of a chair without assistance, and balance training.   CARDIAC REHAB PHASE II EXERCISE from 06/14/2018 in [facility]  Date  06/14/18  Educator  EP  Instruction Review Code  2- Demonstrated Understanding      Meditation and Mindfulness:  -Group instruction provided by verbal instruction, patient participation, and written materials to support subject.  Instructor addresses importance of mindfulness and meditation practice to  help reduce stress and improve awareness.  Instructor also leads participants through a meditation exercise.    CARDIAC REHAB PHASE II EXERCISE from 11/08/2016 in [place] [facility]  Date  09/20/16  Instruction Review Code (Retired)  2- meets Insurance account manager for Flexibility and Mobility:  -Group instruction provided by verbal instruction, patient participation, and written materials to support subject.  Instructors lead participants through series of stretches that are designed to increase flexibility thus improving mobility.  These stretches are additional exercise for major muscle groups that are typically performed during regular warm up and cool down.   CARDIAC REHAB PHASE II EXERCISE from 11/08/2016 in [facility]  Date  10/20/16  Instruction Review Code (Retired)  R- Review/reinforce      Hands Only CPR:  -Group verbal, video, and participation provides a basic overview of AHA guidelines for community CPR. Role-play of emergencies allow participants the opportunity to practice calling for help and chest compression technique with discussion of AED use.   Hypertension: -Group verbal and written instruction that provides a basic overview of hypertension including the most recent diagnostic guidelines, risk factor reduction with self-care instructions and medication management.    Nutrition I class: Heart Healthy Eating:  -Group instruction provided by PowerPoint slides, verbal discussion, and written materials to support subject matter. The instructor gives an explanation and review of the Therapeutic Lifestyle Changes diet recommendations, which includes a discussion on lipid goals, dietary fat, sodium, fiber, plant stanol/sterol esters, sugar, and the components of a well-balanced, healthy diet.   CARDIAC REHAB PHASE II EXERCISE from 11/08/2016 in [place]  Date  09/05/16  Educator   RD  Instruction Review Code (Retired)  2- meets goals/outcomes  Nutrition II class: Lifestyle Skills:  -Group instruction provided by PowerPoint slides, verbal discussion, and written materials to support subject matter. The instructor gives an explanation and review of label reading, grocery shopping for heart health, heart healthy recipe modifications, and ways to make healthier choices when eating out.   Diabetes Question & Answer:  -Group instruction provided by PowerPoint slides, verbal discussion, and written materials to support subject matter. The instructor gives an explanation and review of diabetes co-morbidities, pre- and post-prandial blood glucose goals, pre-exercise blood glucose goals, signs, symptoms, and treatment of hypoglycemia and hyperglycemia, and foot care basics.   CARDIAC REHAB PHASE II EXERCISE from 11/08/2016 in [place]  Date  11/03/16  Instruction Review Code (Retired)  2- meets goals/outcomes      Diabetes Blitz:  -Group instruction provided by [employer], verbal discussion, and written materials to support subject matter. The instructor gives an explanation and review of the physiology behind type 1 and type 2 diabetes, diabetes medications and rational behind using different medications, pre- and post-prandial blood glucose recommendations and Hemoglobin A1c goals, diabetes diet, and exercise including blood glucose guidelines for exercising safely.    Portion Distortion:  -Group instruction provided by PowerPoint slides, verbal discussion, written materials, and food models to support subject matter. The instructor gives an explanation of serving size versus portion size, changes in portions sizes over the last 20 years, and what consists of a serving from each food group.   CARDIAC REHAB PHASE II EXERCISE from 11/08/2016 in [place]  Date  09/27/16  Educator  RD  Instruction  Review Code (Retired)  2- meets goals/outcomes      Stress Management:  -Group instruction provided by verbal instruction, video, and written materials to support subject matter.  Instructors review role of stress in heart disease and how to cope with stress positively.     CARDIAC REHAB PHASE II EXERCISE from 11/08/2016 in [place]  Date  10/04/16  Instruction Review Code (Retired)  2- meets goals/outcomes      Exercising on Your Own:  -Group instruction provided by verbal instruction, power point, and written materials to support subject.  Instructors discuss benefits of exercise, components of exercise, frequency and intensity of exercise, and end points for exercise.  Also discuss use of nitroglycerin and activating EMS.  Review options of places to exercise outside of rehab.  Review guidelines for sex with heart disease.   CARDIAC REHAB PHASE II EXERCISE from 11/08/2016 in [place]  Date  08/30/16  Educator  Cleda Mccreedy  Instruction Review Code (Retired)  2- meets goals/outcomes      Cardiac Drugs I:  -Group instruction provided by verbal instruction and written materials to support subject.  Instructor reviews cardiac drug classes: antiplatelets, anticoagulants, beta blockers, and statins.  Instructor discusses reasons, side effects, and lifestyle considerations for each drug class.   CARDIAC REHAB PHASE II EXERCISE from 11/08/2016 in [place]  Date  11/08/16  Instruction Review Code (Retired)  R- Review/reinforce      Cardiac Drugs II:  -Group instruction provided by verbal instruction and written materials to support subject.  Instructor reviews cardiac drug classes: angiotensin converting enzyme inhibitors (ACE-I), angiotensin II receptor blockers (ARBs), nitrates, and calcium channel blockers.  Instructor discusses reasons, side effects, and lifestyle considerations for each  drug class.   Anatomy and Physiology of the Circulatory System:  Group verbal and written instruction and models provide basic cardiac anatomy and physiology, with the coronary electrical and arterial systems. Review of: AMI, Angina, Valve disease, Heart Failure, Peripheral Artery Disease, Cardiac Arrhythmia, Pacemakers, and the ICD.   CARDIAC REHAB PHASE II EXERCISE from 11/08/2016 in [place]  Date  11/01/16  Instruction Review Code (Retired)  2- meets goals/outcomes      Other Education:  -Group or individual verbal, written, or video instructions that support the educational goals of the cardiac rehab program.   Holiday Eating Survival Tips:  -Group instruction provided by PowerPoint slides, verbal discussion, and written materials to support subject matter. The instructor gives patients tips, tricks, and techniques to help them not only survive but enjoy the holidays despite the onslaught of food that accompanies the holidays.   Knowledge Questionnaire Score: Knowledge Questionnaire Score - 05/21/18 1526      Knowledge Questionnaire Score   Pre Score  21/24       Core Components/Risk Factors/Patient Goals at Admission: Personal Goals and Risk Factors at Admission - 05/21/18 1528      Core Components/Risk Factors/Patient Goals on Admission    Weight Management  Yes;Weight Maintenance;Weight Loss    Intervention  Weight Management: Develop a combined nutrition and exercise program designed to reach desired caloric intake, while maintaining appropriate intake of nutrient and fiber, sodium and fats, and appropriate energy expenditure required for the weight goal.;Weight Management: Provide education and appropriate resources to help participant work on and attain dietary goals.    Admit Weight  213 lb 13.5 oz (97 kg)    Expected Outcomes  Short Term: Continue to assess and modify interventions until short term weight is achieved;Long Term: Adherence  to nutrition and physical activity/exercise program aimed toward attainment of established weight goal;Weight Maintenance: Understanding of the daily nutrition guidelines, which includes 25-35% calories from fat, 7% or less cal from saturated fats, less than 200mg  cholesterol, less than 1.5gm of sodium, & 5 or more servings of fruits and vegetables daily;Weight Loss: Understanding of general recommendations for a balanced deficit meal plan, which promotes 1-2 lb weight loss per week and includes a negative energy balance of [phone] kcal/d;Understanding recommendations for meals to include 15-35% energy as protein, 25-35% energy from fat, 35-60% energy from carbohydrates, less than 200mg  of dietary cholesterol, 20-35 gm of total fiber daily;Understanding of distribution of calorie intake throughout the day with the consumption of 4-5 meals/snacks    Hypertension  Yes    Intervention  Provide education on lifestyle modifcations including regular physical activity/exercise, weight management, moderate sodium restriction and increased consumption of fresh fruit, vegetables, and low fat dairy, alcohol moderation, and smoking cessation.;Monitor prescription use compliance.    Expected Outcomes  Short Term: Continued assessment and intervention until BP is < 140/32mm HG in hypertensive participants. < 130/65mm HG in hypertensive participants with diabetes, heart failure or chronic kidney disease.;Long Term: Maintenance of blood pressure at goal levels.    Lipids  Yes    Intervention  Provide education and support for participant on nutrition & aerobic/resistive exercise along with prescribed medications to achieve LDL 70mg , HDL >40mg .    Expected Outcomes  Short Term: Participant states understanding of desired cholesterol values and is compliant with medications prescribed. Participant is following exercise prescription and nutrition guidelines.;Long Term: Cholesterol controlled with medications as prescribed,  with individualized exercise RX and with personalized nutrition plan. Value goals: LDL < 70mg , HDL > 40 mg.  Core Components/Risk Factors/Patient Goals Review:  Goals and Risk Factor Review    Row Name 06/20/18 1404             Core Components/Risk Factors/Patient Goals Review   Personal Goals Review  Weight Management/Obesity;Lipids;Hypertension       Review  Chuck's vital signs have been stable at cardiac rehab. [name] is doing well with exercise. Chuck plans to participate in phase 2 cardiac rehab until the end of the year       Expected Outcomes  [name] will continue to participate in phase 2 cardiac rehab for exercise, diet and lifestyle modifications.          Core Components/Risk Factors/Patient Goals at Discharge (Final Review):  Goals and Risk Factor Review - 06/20/18 1404      Core Components/Risk Factors/Patient Goals Review   Personal Goals Review  Weight Management/Obesity;Lipids;Hypertension    Review  Chuck's vital signs have been stable at cardiac rehab. [name] is doing well with exercise. Chuck plans to participate in phase 2 cardiac rehab until the end of the year    Expected Outcomes  [name] will continue to participate in phase 2 cardiac rehab for exercise, diet and lifestyle modifications.       ITP Comments: ITP Comments    Row Name 05/21/18 1506 05/28/18 1410 06/20/18 1401       ITP Comments  Dr. Fransico Him, MD  30 Day ITP Review. Chuck tolerated his first day of exercise without difficulty  30 Day ITP Review. [name] is with good participation in cardiac rehab and attend exercise when it does not conflict with his work schedule        Comments: See ITP comments.Barnet Pall, RN,BSN 06/20/2018 2:09 PM

## 2018-06-21 ENCOUNTER — Encounter (HOSPITAL_COMMUNITY): Payer: 59

## 2018-06-21 ENCOUNTER — Encounter (HOSPITAL_COMMUNITY)
Admission: RE | Admit: 2018-06-21 | Discharge: 2018-06-21 | Disposition: A | Discharge status: Home / Self Care | Payer: 59 | Source: Ambulatory Visit | Attending: Cardiology | Admitting: Cardiology

## 2018-06-21 DIAGNOSIS — Z955 Presence of coronary angioplasty implant and graft: Secondary | ICD-10-CM | POA: Diagnosis not present

## 2018-06-21 DIAGNOSIS — I2121 ST elevation (STEMI) myocardial infarction involving left circumflex coronary artery: Secondary | ICD-10-CM

## 2018-06-24 ENCOUNTER — Encounter (HOSPITAL_COMMUNITY)
Admission: RE | Admit: 2018-06-24 | Discharge: 2018-06-24 | Disposition: A | Discharge status: Home / Self Care | Payer: 59 | Source: Ambulatory Visit | Attending: Cardiology | Admitting: Cardiology

## 2018-06-24 ENCOUNTER — Encounter (HOSPITAL_COMMUNITY): Payer: 59

## 2018-06-24 DIAGNOSIS — I2121 ST elevation (STEMI) myocardial infarction involving left circumflex coronary artery: Secondary | ICD-10-CM

## 2018-06-24 DIAGNOSIS — Z955 Presence of coronary angioplasty implant and graft: Secondary | ICD-10-CM

## 2018-06-26 ENCOUNTER — Encounter (HOSPITAL_COMMUNITY): Payer: 59

## 2018-06-26 ENCOUNTER — Encounter (HOSPITAL_COMMUNITY)
Admission: RE | Admit: 2018-06-26 | Discharge: 2018-06-26 | Disposition: A | Discharge status: Home / Self Care | Payer: 59 | Source: Ambulatory Visit | Attending: Cardiology | Admitting: Cardiology

## 2018-06-26 DIAGNOSIS — Z955 Presence of coronary angioplasty implant and graft: Secondary | ICD-10-CM

## 2018-06-26 DIAGNOSIS — I2121 ST elevation (STEMI) myocardial infarction involving left circumflex coronary artery: Secondary | ICD-10-CM

## 2018-06-26 NOTE — Progress Notes (Signed)
Terry Wright 61 y.o. male Nutrition Note Spoke with pt. Nutrition Plan and Nutrition Survey goals reviewed with pt. Pt is following a Heart Healthy diet. Heart health tips reviewed (label reading, how to build a healthy plate, portion sizes, eating frequently across the day). Pt was concerned that his HDL was low, discussed with patient that continuing to exercise as prescribed by his exercise physiologist, and focusing on reducing saturated and especially trans fats may prove beneficial. Per discussion, pt does not use canned/convenience foods often. Pt does not add salt to food. Pt does not eat out frequently. Pt expressed understanding of the information reviewed. Pt aware of nutrition education classes offered and does plan on attending nutrition classes.  No results found for: HGBA1C  Wt Readings from Last 3 Encounters:  05/21/18 213 lb 13.5 oz (97 kg)  05/06/18 215 lb (97.5 kg)  04/11/18 213 lb 3.2 oz (96.7 kg)    Nutrition Diagnosis ? Food-and nutrition-related knowledge deficit related to lack of exposure to information as related to diagnosis of: ? CVD   Nutrition Intervention ? Pt's individual nutrition plan reviewed with pt. ? Benefits of continuing to follow a Heart Healthy diet discussed when [employer] reviewed.   Goal(s) ? Pt to describe the potential benefits of adopting a Heart Healthy diet.  ? Pt to identify and limit food sources of saturated fat, trans fat, refined carbohydrates and sodium ? Pt to eat whole grains for breakfast.  Plan:   Pt to attend nutrition classes ? Nutrition I ? Nutrition II ? Portion Distortion   Will provide client-centered nutrition education as part of interdisciplinary care  Monitor and evaluate progress toward nutrition goal with team.    Laurina Bustle, MS, RD, LDN 06/26/2018 3:17 PM

## 2018-06-28 ENCOUNTER — Encounter (HOSPITAL_COMMUNITY): Payer: 59

## 2018-07-01 ENCOUNTER — Telehealth (HOSPITAL_COMMUNITY): Payer: Self-pay | Admitting: Family Medicine

## 2018-07-01 ENCOUNTER — Encounter (HOSPITAL_COMMUNITY): Payer: 59

## 2018-07-02 ENCOUNTER — Ambulatory Visit (INDEPENDENT_AMBULATORY_CARE_PROVIDER_SITE_OTHER): Payer: 59 | Admitting: Family Medicine

## 2018-07-02 ENCOUNTER — Encounter: Payer: Self-pay | Admitting: Family Medicine

## 2018-07-02 VITALS — BP 104/72 | HR 73 | Temp 98.4°F | Ht 73.0 in | Wt 216.0 lb

## 2018-07-02 DIAGNOSIS — I2510 Atherosclerotic heart disease of native coronary artery without angina pectoris: Secondary | ICD-10-CM | POA: Diagnosis not present

## 2018-07-02 DIAGNOSIS — J019 Acute sinusitis, unspecified: Secondary | ICD-10-CM

## 2018-07-02 DIAGNOSIS — Z9861 Coronary angioplasty status: Secondary | ICD-10-CM | POA: Diagnosis not present

## 2018-07-02 MED ORDER — PREDNISONE 20 MG PO TABS: ORAL_TABLET | ORAL | 0 refills | Status: DC

## 2018-07-02 NOTE — Progress Notes (Signed)
PCP: Marin Olp, MD  Subjective:  Terry Wright is a 62 y.o. year old very pleasant male patient who presents with sinusitis symptoms including nasal congestion, sinus tenderness Symptoms include mild sore throat starting day 1.  He thought could be related to smoking a casino.Feels like head congestion and down into the throat. Minimal nasal discharge but getting fair amount of throat- some speckles of blood (is on plavix) -day of illness:Day 5 of symptoms today.  Worsened when got home on Saturday. Seems to be worsening- particularly each AM.  -Symptoms are worsening -previous treatments: Phenol sore throat spray, cough and cold HBP- with chlorpheniramine and dextromethorphan as well as flonase nasal spray. Not much relief with those -sick contacts/travel/risks: Patient was at [place] last week.  So multiple potential contacts. denies flu exposure.   ROS-denies measured fever but has had some elevated temperatures into the 99's chills.  No SOB, NVD, tooth pain.    Pertinent Past Medical History-  Patient Active Problem List   Diagnosis Date Noted  . CAD S/P percutaneous coronary angioplasty 09/21/2016    Priority: High  . Panic attacks 09/21/2016    Priority: High  . History of DVT (deep vein thrombosis)     Priority: High  . Former smoker     Priority: High  . Centrilobular emphysema ([place]) 09/04/2017    Priority: Medium  . Hyperglycemia 09/21/2016    Priority: Medium  . Dyslipidemia, goal LDL below 50     Priority: Medium  . Essential hypertension     Priority: Medium  . Statin intolerance     Priority: Medium  . Mitral regurgitation     Priority: Low  . GERD (gastroesophageal reflux disease)     Priority: Low  . Ventricular bigeminy     Priority: Low  . Unstable angina ([place])   . Chest pain 03/25/2018  . Aortic atherosclerosis ([place]) 09/04/2017  . Acute medial meniscus tear of right knee 04/04/2017  . 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  Ischemic cardiomyopathy 10/12/2016  . STEMI involving left circumflex coronary artery ([place]) -complicated by cardiac arrest 08/04/2016    Medications- reviewed  Current Outpatient Medications  Medication Sig Dispense Refill  . acetaminophen (TYLENOL) 500 MG tablet Take 1,000 mg by mouth every 6 (six) hours as needed for headache (pain).    Marland Kitchen aspirin EC 81 MG EC tablet Take 1 tablet (81 mg total) by mouth daily.    . carvedilol (COREG) 6.25 MG tablet TAKE 2 TABLETS BY MOUTH  EVERY MORNING AND 1 TABLET  EVERY EVENING. (Patient taking differently: Take 12.5 mg by mouth 2 (two) times daily with a meal. ) 270 tablet 1  . clopidogrel (PLAVIX) 75 MG tablet Take 75 mg by mouth daily.    Marland Kitchen LORazepam (ATIVAN) 0.5 MG tablet Take 0.5 mg by mouth 2 (two) times daily.     . nitroGLYCERIN (NITROSTAT) 0.4 MG SL tablet Place 1 tablet (0.4 mg total) under the tongue every 5 (five) minutes as needed for chest pain. 25 tablet 1  . pantoprazole (PROTONIX) 40 MG tablet Take 1 tablet (40 mg total) by mouth daily. 90 tablet 3  . rosuvastatin (CRESTOR) 20 MG tablet TAKE 1 TABLET BY MOUTH  DAILY 90 tablet 1   No current facility-administered medications for this visit.     Objective: BP 104/72 (BP Location: Left Arm, Patient Position: Sitting, Cuff Size: Large)   Pulse 73   Temp 98.4 F (36.9 C) (Oral)   Ht 6'  1" (1.854 m)   Wt 216 lb (98 kg)   SpO2 95%   BMI 28.50 kg/m  Gen: NAD, resting comfortably HEENT: Turbinates erythematous with light yellow drainage, TM normal, pharynx mildly erythematous with no tonsilar exudate or edema, minimal sinus tenderness CV: RRR no murmurs rubs or gallops Lungs: CTAB no crackles, wheeze, rhonchi Ext: no edema Skin: warm, dry, no rash  Assessment/Plan:  Sinsusitis/sinus infection Viral based on <10 days, no double sickening, lack of severity of symptoms in first 3 days. Educated on signs that bacterial infection may have developed (symptoms over 10 days, double  sickening)- if either of these happen I agreed to call in augmentin for him to help clear what would at that point be considered bacterial sinus infection  Treatment: -considered steroid: we opted in- will trial  prednisone course -other symptomatic care with over the counter mediations he has purchased  Finally, we reviewed reasons to return to care including if symptoms worsen or persist or new concerns arise (particularly fever or shortness of breath)   CAD S/P percutaneous coronary angioplasty S: Patient updates me on 03/27/18 had a new stent placed.  He denies chest pain with current episode or significant shortness of breath. A/P: Patient asked me to review catheterization report-- left circumflex stent due to 95% occlusion.  He also had 70% narrowing on RCA-which they are managing medically   Meds ordered this encounter  Medications  . predniSONE (DELTASONE) 20 MG tablet    Sig: Take 2 pills for 3 days, 1 pill for 4 days    Dispense:  10 tablet    Refill:  0    Garret Reddish, MD

## 2018-07-02 NOTE — Assessment & Plan Note (Signed)
S: Patient updates me on 03/27/18 had a new stent placed.  He denies chest pain with current episode or significant shortness of breath. A/P: Patient asked me to review catheterization report-- left circumflex stent due to 95% occlusion.  He also had 70% narrowing on RCA-which they are managing medically

## 2018-07-02 NOTE — Patient Instructions (Addendum)
Health Maintenance Due  Topic Date Due  . COLONOSCOPY - Lets see if you are a candidate for cologuard- I am going to ask Roselyn Reef to try to get a copy of your prior colonoscopy and pathology reports-  02/15/2005   Sinsusitis/sinus infection Viral based on <10 days, no double sickening, lack of severity of symptoms in first 3 days. Educated on signs that bacterial infection may have developed (symptoms over 10 days, double sickening)- if either of these happen I agreed to call in augmentin for him to help clear what would at that point be considered bacterial sinus infection  Treatment: -considered steroid: we opted in- will trial  prednisone course -other symptomatic care with over the counter mediations he has purchased  Finally, we reviewed reasons to return to care including if symptoms worsen or persist or new concerns arise (particularly fever or shortness of breath)  Meds ordered this encounter  Medications  . predniSONE (DELTASONE) 20 MG tablet    Sig: Take 2 pills for 3 days, 1 pill for 4 days    Dispense:  10 tablet    Refill:  0

## 2018-07-03 ENCOUNTER — Encounter (HOSPITAL_COMMUNITY): Payer: 59

## 2018-07-03 ENCOUNTER — Telehealth (HOSPITAL_COMMUNITY): Payer: Self-pay | Admitting: Family Medicine

## 2018-07-05 ENCOUNTER — Encounter (HOSPITAL_COMMUNITY): Payer: 59

## 2018-07-05 ENCOUNTER — Telehealth (HOSPITAL_COMMUNITY): Payer: Self-pay | Admitting: Family Medicine

## 2018-07-07 ENCOUNTER — Encounter: Payer: Self-pay | Admitting: Family Medicine

## 2018-07-08 ENCOUNTER — Encounter (HOSPITAL_COMMUNITY): Payer: 59

## 2018-07-08 MED ORDER — AMOXICILLIN-POT CLAVULANATE 875-125 MG PO TABS: 1.0000 | ORAL_TABLET | Freq: Two times a day (BID) | ORAL | 0 refills | Status: AC

## 2018-07-10 ENCOUNTER — Encounter (HOSPITAL_COMMUNITY): Payer: 59

## 2018-07-10 ENCOUNTER — Telehealth (HOSPITAL_COMMUNITY): Payer: Self-pay | Admitting: Family Medicine

## 2018-07-11 ENCOUNTER — Telehealth (HOSPITAL_COMMUNITY): Payer: Self-pay | Admitting: *Deleted

## 2018-07-11 ENCOUNTER — Encounter (HOSPITAL_COMMUNITY): Payer: Self-pay | Admitting: *Deleted

## 2018-07-11 DIAGNOSIS — Z955 Presence of coronary angioplasty implant and graft: Secondary | ICD-10-CM

## 2018-07-11 NOTE — Progress Notes (Signed)
Cardiac Individual Treatment Plan  Patient Details  Name: Terry Wright MRN: [id number] Date of Birth: February 21, 1956 Referring Provider:     CARDIAC REHAB PHASE II ORIENTATION from 05/21/2018 in [place]  Referring Provider  [name], MD      Initial Encounter Date:    CARDIAC REHAB PHASE II ORIENTATION from 05/21/2018 in [place]  Date  05/21/18      Visit Diagnosis: S/P coronary artery stent placement 03/27/18 S/P DES OM3  Patient's Home Medications on Admission:  Current Outpatient Medications:  .  acetaminophen (TYLENOL) 500 MG tablet, Take 1,000 mg by mouth every 6 (six) hours as needed for headache (pain)., Disp: , Rfl:  .  amoxicillin-clavulanate (AUGMENTIN) 875-125 MG tablet, Take 1 tablet by mouth 2 (two) times daily for 7 days., Disp: 14 tablet, Rfl: 0 .  aspirin EC 81 MG EC tablet, Take 1 tablet (81 mg total) by mouth daily., Disp: , Rfl:  .  carvedilol (COREG) 6.25 MG tablet, TAKE 2 TABLETS BY MOUTH  EVERY MORNING AND 1 TABLET  EVERY EVENING. (Patient taking differently: Take 12.5 mg by mouth 2 (two) times daily with a meal. ), Disp: 270 tablet, Rfl: 1 .  clopidogrel (PLAVIX) 75 MG tablet, Take 75 mg by mouth daily., Disp: , Rfl:  .  LORazepam (ATIVAN) 0.5 MG tablet, Take 0.5 mg by mouth 2 (two) times daily. , Disp: , Rfl:  .  nitroGLYCERIN (NITROSTAT) 0.4 MG SL tablet, Place 1 tablet (0.4 mg total) under the tongue every 5 (five) minutes as needed for chest pain., Disp: 25 tablet, Rfl: 1 .  pantoprazole (PROTONIX) 40 MG tablet, Take 1 tablet (40 mg total) by mouth daily., Disp: 90 tablet, Rfl: 3 .  predniSONE (DELTASONE) 20 MG tablet, Take 2 pills for 3 days, 1 pill for 4 days, Disp: 10 tablet, Rfl: 0 .  rosuvastatin (CRESTOR) 20 MG tablet, TAKE 1 TABLET BY MOUTH  DAILY, Disp: 90 tablet, Rfl: 1  Past Medical History: Past Medical History:  Diagnosis Date  . CAD S/P percutaneous coronary  angioplasty 07/2016, 02/2018   a) 100% pCx -- DES PCI with 4.0 x 16 mm Synergy DES. 65% ostial OM 3 (relatively small caliber - Med Rx);; b) 03/27/18 PCI/DES to dLCX/OM3 x1. Patent pCX DES. 700% dRCA trifurcation lesion (Med Rx). Normal EF  . DVT (deep venous thrombosis) (HCC)    Right upper arm DVT on 2 occasions in the remote past  . Ejection fraction    LV function normal, echo, February, 2010  . GERD (gastroesophageal reflux disease)   . Hyperlipidemia   . Hypertension   . Mitral regurgitation    Mild, echo, February, 2010  . Palpitations    Mild in the past  . Statin intolerance    Felt poorly after Lipitor, Crestor, TriCor  . STEMI involving left circumflex coronary artery ([place]) 08/04/2016   Occluded very large caliber codominant circumflex - PCI with single Synergy DES  . Tobacco abuse     Tobacco Use: Social History   Tobacco Use  Smoking Status Former Smoker  . Packs/day: 1.00  . Years: 43.00  . Pack years: 43.00  . Last attempt to quit: 2015  . Years since quitting: 4.9  Smokeless Tobacco Never Used  Tobacco Comment   Former smoker quit 2015    Labs: Recent Review Scientist, physiological    Labs for ITP Cardiac and Pulmonary Rehab Latest Ref Rng & Units 12/20/2011 08/04/2016  11/03/2016 05/09/2017 03/27/2018   Cholestrol 0 - 200 mg/dL 181 - 101 110 106   LDLCALC 0 - 99 mg/dL 110(H) - 50 46 56   LDLDIRECT mg/dL - - - - -   HDL >40 mg/dL 35.30(L) - 29(L) 35(L) 30(L)   Trlycerides <150 mg/dL 180.0(H) - 111 143 99   TCO2 0 - 100 mmol/L - 28 - - -      Capillary Blood Glucose: No results found for: GLUCAP   Exercise Target Goals: Exercise Program Goal: Individual exercise prescription set using results from initial 6 min walk test and THRR while considering  patient's activity barriers and safety.   Exercise Prescription Goal: Initial exercise prescription builds to 30-45 minutes a day of aerobic activity, 2-3 days per week.  Home exercise guidelines will be given to patient  during program as part of exercise prescription that the participant will acknowledge.  Activity Barriers & Risk Stratification: Activity Barriers & Cardiac Risk Stratification - 05/21/18 1533      Activity Barriers & Cardiac Risk Stratification   Activity Barriers  Joint Problems   Left Shoulder pain       6 Minute Walk: 6 Minute Walk    Row Name 05/22/18 0808         6 Minute Walk   Distance  1693 feet     Distance % Change  4.1 %     Walk Time  6 minutes     MPH  3.33     METS  3.99     RPE  10     VO2 Peak  13.98     Symptoms  No     Resting HR  63 bpm     Resting BP  116/78     Resting Oxygen Saturation   96 %     Exercise Oxygen Saturation  during 6 min walk  97 %     Max Ex. HR  82 bpm     Max Ex. BP  128/76     2 Minute Post BP  108/70        Oxygen Initial Assessment:   Oxygen Re-Evaluation:   Oxygen Discharge (Final Oxygen Re-Evaluation):   Initial Exercise Prescription: Initial Exercise Prescription - 05/21/18 1500      Date of Initial Exercise RX and Referring Provider   Date  05/21/18    Referring Provider  [name], MD    Expected Discharge Date  08/30/18      Treadmill   MPH  3.5    Grade  2    Minutes  10    METs  3.8      Bike   Level  1.5    Minutes  10    METs  3.98      NuStep   Level  4    SPM  85    Minutes  10    METs  3.6      Prescription Details   Frequency (times per week)  3    Duration  Progress to 30 minutes of continuous aerobic without signs/symptoms of physical distress      Intensity   THRR 40-80% of Max Heartrate  63-126    Ratings of Perceived Exertion  11-13      Progression   Progression  Continue to progress workloads to maintain intensity without signs/symptoms of physical distress.      Resistance Training   Training Prescription  Yes    Weight  3  lbs    Reps  10-15       Perform Capillary Blood Glucose checks as needed.  Exercise Prescription Changes: Exercise Prescription  Changes    Row Name 05/27/18 1456 06/12/18 1505 06/17/18 1503 06/26/18 1459       Response to Exercise   Blood Pressure (Admit)  102/62  106/62  120/70  116/62    Blood Pressure (Exercise)  128/80  122/68  138/80  118/60    Blood Pressure (Exit)  100/72  124/70  124/80  98/74    Heart Rate (Admit)  77 bpm  68 bpm  70 bpm  69 bpm    Heart Rate (Exercise)  90 bpm  91 bpm  127 bpm  93 bpm    Heart Rate (Exit)  77 bpm  76 bpm  70 bpm  67 bpm    Rating of Perceived Exertion (Exercise)  9  9  13  10     Symptoms  none  none  none  none    Duration  Progress to 30 minutes of  aerobic without signs/symptoms of physical distress  Progress to 30 minutes of  aerobic without signs/symptoms of physical distress  Progress to 30 minutes of  aerobic without signs/symptoms of physical distress  Progress to 30 minutes of  aerobic without signs/symptoms of physical distress    Intensity  THRR unchanged  THRR unchanged  THRR unchanged  THRR unchanged      Progression   Progression  Continue to progress workloads to maintain intensity without signs/symptoms of physical distress.  Continue to progress workloads to maintain intensity without signs/symptoms of physical distress.  Continue to progress workloads to maintain intensity without signs/symptoms of physical distress.  Continue to progress workloads to maintain intensity without signs/symptoms of physical distress.    Average METs  3.7  3.8  4  4.3      Resistance Training   Training Prescription  Yes  No Relaxation day, no weights  Yes  No Relaxation day, no weights    Weight  3 lbs  -  9lbs  -    Reps  10-15  -  10-15  -    Time  10 Minutes  -  10 Minutes  -      Interval Training   Interval Training  No  No  No  No      Treadmill   MPH  3.5  3.5  3.5  3.5    Grade  2  2  2  2     Minutes  10  10  10  10     METs  4.65  4.65  4.65  4.65      Bike   Level  1.5  1.5  1.5  1.5    Minutes  10  10  10  10     METs  3.9  3.87  3.89  3.91      NuStep    Level  4  5  5  5     SPM  85  85  85  85    Minutes  10  10  10  10     METs  2.6  2.8  3.4  - missed METs average      Home Exercise Plan   Plans to continue exercise at  -  [employer] (comment)  Forensic scientist (comment)  Forensic scientist (comment)    Frequency  -  Add 4 additional days to program exercise sessions.  Add 4 additional days to program exercise sessions.  Add 4 additional days to program exercise sessions.    Initial Home Exercises Provided  -  05/31/18  05/31/18  05/31/18       Exercise Comments: Exercise Comments    Row Name 05/27/18 1456 05/31/18 1512 06/14/18 1522 06/17/18 1443     Exercise Comments  Patient tolerated 1st session of exericse well. Overhead stretching limited by chronic left shoulder pain.  Reviewed home exercise guidelines, METs, and goals with patient.  Reviewed METs with patient.  Reviewed METs and goals with patient.       Exercise Goals and Review: Exercise Goals    Row Name 05/21/18 1542             Exercise Goals   Increase Physical Activity  Yes       Intervention  Develop an individualized exercise prescription for aerobic and resistive training based on initial evaluation findings, risk stratification, comorbidities and participant's personal goals.;Provide advice, education, support and counseling about physical activity/exercise needs.       Expected Outcomes  Short Term: Attend rehab on a regular basis to increase amount of physical activity.       Increase Strength and Stamina  Yes       Intervention  Provide advice, education, support and counseling about physical activity/exercise needs.;Develop an individualized exercise prescription for aerobic and resistive training based on initial evaluation findings, risk stratification, comorbidities and participant's personal goals.       Expected Outcomes  Short Term: Increase workloads from initial exercise prescription for resistance, speed, and METs.       Able to  understand and use rate of perceived exertion (RPE) scale  Yes       Intervention  Provide education and explanation on how to use RPE scale       Expected Outcomes  Short Term: Able to use RPE daily in rehab to express subjective intensity level;Long Term:  Able to use RPE to guide intensity level when exercising independently       Intervention  Provide education and explanation of THRR including how the numbers were predicted and where they are located for reference       Expected Outcomes  Short Term: Able to state/look up THRR;Long Term: Able to use THRR to govern intensity when exercising independently;Short Term: Able to use daily as guideline for intensity in rehab       Able to check pulse independently  Yes       Intervention  Provide education and demonstration on how to check pulse in carotid and radial arteries.;Review the importance of being able to check your own pulse for safety during independent exercise       Expected Outcomes  Short Term: Able to explain why pulse checking is important during independent exercise;Long Term: Able to check pulse independently and accurately       Understanding of Exercise Prescription  Yes       Intervention  Provide education, explanation, and written materials on patient's individual exercise prescription       Expected Outcomes  Short Term: Able to explain program exercise prescription;Long Term: Able to explain home exercise prescription to exercise independently          Exercise Goals Re-Evaluation : Exercise Goals Re-Evaluation    Row Name 05/27/18 1456 05/31/18 1512 06/17/18 1443         Exercise Goal Re-Evaluation   Exercise Goals Review  Able to understand and use  rate of perceived exertion (RPE) scale;Increase Physical Activity  Able to understand and use rate of perceived exertion (RPE) scale;Increase Physical Activity;Understanding of Exercise Prescription;Able to check pulse independently;Knowledge and understanding of Target  Heart Rate Range (THRR)  Able to understand and use rate of perceived exertion (RPE) scale;Increase Physical Activity;Understanding of Exercise Prescription;Able to check pulse independently;Knowledge and understanding of Target Heart Rate Range (THRR)     Comments  Patient able to understand and use RPE scale appropriately.  Reviewed home exericse guidelines with patient including THRR, RPE scale, and endpoints for exercise. 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 Pt does need left shoulder surgery and is limited with weight lifting because of this.  Patient is walking 30-35 minutes on his TM at home 2-3 days/week. Pt plans to finish up cardiac rehab in mid December and continue exercise on his own at that time.     Expected Outcomes  Porgress workloads as tolerted to help improve strength and stamina.  Pt will continue exercise 30-35 minutes 6-7 days/week to help with risk factor reduction and increase cardiorespiratory fitness.  Pt will continue exercise 30-35 minutes 6-7 days/week to help with risk factor reduction and increase cardiorespiratory fitness.        Discharge Exercise Prescription (Final Exercise Prescription Changes): Exercise Prescription Changes - 06/26/18 1459      Response to Exercise   Blood Pressure (Admit)  116/62    Blood Pressure (Exercise)  118/60    Blood Pressure (Exit)  98/74    Heart Rate (Admit)  69 bpm    Heart Rate (Exercise)  93 bpm    Heart Rate (Exit)  67 bpm    Rating of Perceived Exertion (Exercise)  10    Symptoms  none    Duration  Progress to 30 minutes of  aerobic without signs/symptoms of physical distress    Intensity  THRR unchanged      Progression   Progression  Continue to progress workloads to maintain intensity without signs/symptoms of physical distress.    Average METs  4.3       Resistance Training   Training Prescription  No   Relaxation day, no weights     Interval Training   Interval Training  No      Treadmill   MPH  3.5    Grade  2    Minutes  10    METs  4.65      Bike   Level  1.5    Minutes  10    METs  3.91      NuStep   Level  5    SPM  85    Minutes  10    METs  --   missed METs average     Home Exercise Plan   Plans to continue exercise at  [employer] (comment)    Frequency  Add 4 additional days to program exercise sessions.    Initial Home Exercises Provided  05/31/18       Nutrition:  Target Goals: Understanding of nutrition guidelines, daily intake of sodium 1500mg , cholesterol 200mg , calories 30% from fat and 7% or less from saturated fats, daily to have 5 or more servings of fruits and vegetables.  Biometrics: Pre Biometrics - 05/21/18 1542      Pre Biometrics  Height  6\' 1"  (1.854 m)    Weight  213 lb 13.5 oz (97 kg)    Waist Circumference  44.5 inches    Hip Circumference  43.5 inches    Waist to Hip Ratio  1.02 %    BMI (Calculated)  28.22    Triceps Skinfold  29 mm    % Body Fat  32 %    Grip Strength  49 kg    Flexibility  0 in    Single Leg Stand  30 seconds        Nutrition Therapy Plan and Nutrition Goals: Nutrition Therapy & Goals - 06/26/18 1522      Nutrition Therapy   Diet  heart healthy      Personal Nutrition Goals   Nutrition Goal  Pt to eat whole grains for breakfast.    Personal Goal #2  Pt to identify and limit food sources of saturated fat, trans fat, refined carbohydrates and sodium    Personal Goal #3  Pt to describe the potential benefits of adopting a Heart Healthy diet.       Intervention Plan   Intervention  Prescribe, educate and counsel regarding individualized specific dietary modifications aiming towards targeted core components such as weight, hypertension, lipid management, diabetes, heart failure and other comorbidities.    Expected Outcomes  Short Term Goal:  Understand basic principles of dietary content, such as calories, fat, sodium, cholesterol and nutrients.;Long Term Goal: Adherence to prescribed nutrition plan.       Nutrition Assessments: Nutrition Assessments - 06/26/18 1523      MEDFICTS Scores   Pre Score  26       Nutrition Goals Re-Evaluation:   Nutrition Goals Re-Evaluation:   Nutrition Goals Discharge (Final Nutrition Goals Re-Evaluation):   Psychosocial: Target Goals: Acknowledge presence or absence of significant depression and/or stress, maximize coping skills, provide positive support system. Participant is able to verbalize types and ability to use techniques and skills needed for reducing stress and depression.  Initial Review & Psychosocial Screening: Initial Psych Review & Screening - 05/21/18 1545      Initial Review   Current issues with  None Identified      Family Dynamics   Good Support System?  Yes   Terry Wright has his wife for support     Barriers   Psychosocial barriers to participate in program  There are no identifiable barriers or psychosocial needs.      Screening Interventions   Interventions  Provide feedback about the scores to participant       Quality of Life Scores: Quality of Life - 05/21/18 1526      Quality of Life   Select  Quality of Life      Quality of Life Scores   Health/Function Pre  25.85 %    Socioeconomic Pre  24 %    Psych/Spiritual Pre  24 %    Family Pre  24 %    GLOBAL Pre  24.77 %      Scores of 19 and below usually indicate a poorer quality of life in these areas.  A difference of  2-3 points is a clinically meaningful difference.  A difference of 2-3 points in the total score of the Quality of Life Index has been associated with significant improvement in overall quality of life, self-image, physical symptoms, and general health in studies assessing change in quality of life.  PHQ-9: Recent Review Flowsheet Data    Depression screen [facility] 2/9  07/02/2018  11/20/2016 08/28/2016   Decreased Interest 0 0 0   Down, Depressed, Hopeless 0 0 0   PHQ - 2 Score 0 0 0     Interpretation of Total Score  Total Score Depression Severity:  1-4 = Minimal depression, 5-9 = Mild depression, 10-14 = Moderate depression, 15-19 = Moderately severe depression, 20-27 = Severe depression   Psychosocial Evaluation and Intervention:   Psychosocial Re-Evaluation: Psychosocial Re-Evaluation    Daisy Name 06/20/18 1403 07/11/18 1451           Psychosocial Re-Evaluation   Current issues with  None Identified;Current Stress Concerns  None Identified      Interventions  Stress management education;Encouraged to attend Cardiac Rehabilitation for the exercise  Encouraged to attend Cardiac Rehabilitation for the exercise      Continue Psychosocial Services   No Follow up required  No Follow up required         Psychosocial Discharge (Final Psychosocial Re-Evaluation): Psychosocial Re-Evaluation - 07/11/18 1451      Psychosocial Re-Evaluation   Current issues with  None Identified    Interventions  Encouraged to attend Cardiac Rehabilitation for the exercise    Continue Psychosocial Services   No Follow up required       Vocational Rehabilitation: Provide vocational rehab assistance to qualifying candidates.   Vocational Rehab Evaluation & Intervention: Vocational Rehab - 05/21/18 1546      Initial Vocational Rehab Evaluation & Intervention   Assessment shows need for Vocational Rehabilitation  No   Terry Wright works part time and does not need vocational rehab at this time      Education: Education Goals: Education classes will be provided on a weekly basis, covering required topics. Participant will state understanding/return demonstration of topics presented.  Learning Barriers/Preferences: Learning Barriers/Preferences - 05/21/18 1527      Learning Barriers/Preferences   Learning Preferences  Written Material;Verbal  Instruction;Pictoral;Audio;Video;Skilled Demonstration       Education Topics: Count Your Pulse:  -Group instruction provided by verbal instruction, demonstration, patient participation and written materials to support subject.  Instructors address importance of being able to find your pulse and how to count your pulse when at home without a heart monitor.  Patients get hands on experience counting their pulse with staff help and individually.   Heart Attack, Angina, and Risk Factor Modification:  -Group instruction provided by verbal instruction, video, and written materials to support subject.  Instructors address signs and symptoms of angina and heart attacks.    Also discuss risk factors for heart disease and how to make changes to improve heart health risk factors.   CARDIAC REHAB PHASE II EXERCISE from 11/08/2016 in [place]  Date  10/25/16  Instruction Review Code (Retired)  2- meets goals/outcomes      Functional Fitness:  -Group instruction provided by verbal instruction, demonstration, patient participation, and written materials to support subject.  Instructors address safety measures for doing things around the house.  Discuss how to get up and down off the floor, how to pick things up properly, how to safely get out of a chair without assistance, and balance training.   CARDIAC REHAB PHASE II EXERCISE from 06/14/2018 in [place]  Date  06/14/18  Educator  EP  Instruction Review Code  2- Demonstrated Understanding      Meditation and Mindfulness:  -Group instruction provided by verbal instruction, patient participation, and written materials to support subject.  Instructor addresses importance  of mindfulness and meditation practice to help reduce stress and improve awareness.  Instructor also leads participants through a meditation exercise.    CARDIAC REHAB PHASE II EXERCISE from 11/08/2016 in [place]  Date  09/20/16  Instruction Review Code (Retired)  2- meets Insurance account manager for Flexibility and Mobility:  -Group instruction provided by verbal instruction, patient participation, and written materials to support subject.  Instructors lead participants through series of stretches that are designed to increase flexibility thus improving mobility.  These stretches are additional exercise for major muscle groups that are typically performed during regular warm up and cool down.   CARDIAC REHAB PHASE II EXERCISE from 11/08/2016 in [place]  Date  10/20/16  Instruction Review Code (Retired)  R- Review/reinforce      Hands Only CPR:  -Group verbal, video, and participation provides a basic overview of AHA guidelines for community CPR. Role-play of emergencies allow participants the opportunity to practice calling for help and chest compression technique with discussion of AED use.   Hypertension: -Group verbal and written instruction that provides a basic overview of hypertension including the most recent diagnostic guidelines, risk factor reduction with self-care instructions and medication management.    Nutrition I class: Heart Healthy Eating:  -Group instruction provided by PowerPoint slides, verbal discussion, and written materials to support subject matter. The instructor gives an explanation and review of the Therapeutic Lifestyle Changes diet recommendations, which includes a discussion on lipid goals, dietary fat, sodium, fiber, plant stanol/sterol esters, sugar, and the components of a well-balanced, healthy diet.   CARDIAC REHAB PHASE II EXERCISE from 11/08/2016 in [place]  Date  09/05/16  Educator  RD  Instruction Review Code (Retired)  2- meets goals/outcomes      Nutrition II class: Lifestyle Skills:  -Group instruction provided by [employer], verbal  discussion, and written materials to support subject matter. The instructor gives an explanation and review of label reading, grocery shopping for heart health, heart healthy recipe modifications, and ways to make healthier choices when eating out.   Diabetes Question & Answer:  -Group instruction provided by PowerPoint slides, verbal discussion, and written materials to support subject matter. The instructor gives an explanation and review of diabetes co-morbidities, pre- and post-prandial blood glucose goals, pre-exercise blood glucose goals, signs, symptoms, and treatment of hypoglycemia and hyperglycemia, and foot care basics.   CARDIAC REHAB PHASE II EXERCISE from 11/08/2016 in [place]  Date  11/03/16  Instruction Review Code (Retired)  2- meets goals/outcomes      Diabetes Blitz:  -Group instruction provided by [employer], verbal discussion, and written materials to support subject matter. The instructor gives an explanation and review of the physiology behind type 1 and type 2 diabetes, diabetes medications and rational behind using different medications, pre- and post-prandial blood glucose recommendations and Hemoglobin A1c goals, diabetes diet, and exercise including blood glucose guidelines for exercising safely.    Portion Distortion:  -Group instruction provided by PowerPoint slides, verbal discussion, written materials, and food models to support subject matter. The instructor gives an explanation of serving size versus portion size, changes in portions sizes over the last 20 years, and what consists of a serving from each food group.   CARDIAC REHAB PHASE II EXERCISE from 11/08/2016 in [place]  Date  09/27/16  Educator  RD  Instruction Review  Code (Retired)  2- meets goals/outcomes      Stress Management:  -Group instruction provided by verbal instruction, video, and written materials to support subject  matter.  Instructors review role of stress in heart disease and how to cope with stress positively.     CARDIAC REHAB PHASE II EXERCISE from 11/08/2016 in [place]  Date  10/04/16  Instruction Review Code (Retired)  2- meets goals/outcomes      Exercising on Your Own:  -Group instruction provided by verbal instruction, power point, and written materials to support subject.  Instructors discuss benefits of exercise, components of exercise, frequency and intensity of exercise, and end points for exercise.  Also discuss use of nitroglycerin and activating EMS.  Review options of places to exercise outside of rehab.  Review guidelines for sex with heart disease.   CARDIAC REHAB PHASE II EXERCISE from 11/08/2016 in [place]  Date  08/30/16  Educator  Cleda Mccreedy  Instruction Review Code (Retired)  2- meets goals/outcomes      Cardiac Drugs I:  -Group instruction provided by verbal instruction and written materials to support subject.  Instructor reviews cardiac drug classes: antiplatelets, anticoagulants, beta blockers, and statins.  Instructor discusses reasons, side effects, and lifestyle considerations for each drug class.   CARDIAC REHAB PHASE II EXERCISE from 11/08/2016 in [place]  Date  11/08/16  Instruction Review Code (Retired)  R- Review/reinforce      Cardiac Drugs II:  -Group instruction provided by verbal instruction and written materials to support subject.  Instructor reviews cardiac drug classes: angiotensin converting enzyme inhibitors (ACE-I), angiotensin II receptor blockers (ARBs), nitrates, and calcium channel blockers.  Instructor discusses reasons, side effects, and lifestyle considerations for each drug class.   Anatomy and Physiology of the Circulatory System:  Group verbal and written instruction and models provide basic cardiac anatomy and physiology, with the  coronary electrical and arterial systems. Review of: AMI, Angina, Valve disease, Heart Failure, Peripheral Artery Disease, Cardiac Arrhythmia, Pacemakers, and the ICD.   CARDIAC REHAB PHASE II EXERCISE from 11/08/2016 in [place]  Date  11/01/16  Instruction Review Code (Retired)  2- meets goals/outcomes      Other Education:  -Group or individual verbal, written, or video instructions that support the educational goals of the cardiac rehab program.   Holiday Eating Survival Tips:  -Group instruction provided by PowerPoint slides, verbal discussion, and written materials to support subject matter. The instructor gives patients tips, tricks, and techniques to help them not only survive but enjoy the holidays despite the onslaught of food that accompanies the holidays.   Knowledge Questionnaire Score: Knowledge Questionnaire Score - 05/21/18 1526      Knowledge Questionnaire Score   Pre Score  21/24       Core Components/Risk Factors/Patient Goals at Admission: Personal Goals and Risk Factors at Admission - 05/21/18 1528      Core Components/Risk Factors/Patient Goals on Admission    Weight Management  Yes;Weight Maintenance;Weight Loss    Intervention  Weight Management: Develop a combined nutrition and exercise program designed to reach desired caloric intake, while maintaining appropriate intake of nutrient and fiber, sodium and fats, and appropriate energy expenditure required for the weight goal.;Weight Management: Provide education and appropriate resources to help participant work on and attain dietary goals.    Admit Weight  213 lb 13.5 oz (97 kg)    Expected Outcomes  Short Term: Continue to assess and modify interventions until short term weight is achieved;Long Term: Adherence to nutrition and physical activity/exercise program aimed toward attainment of established weight goal;Weight Maintenance: Understanding of the daily nutrition guidelines,  which includes 25-35% calories from fat, 7% or less cal from saturated fats, less than 200mg  cholesterol, less than 1.5gm of sodium, & 5 or more servings of fruits and vegetables daily;Weight Loss: Understanding of general recommendations for a balanced deficit meal plan, which promotes 1-2 lb weight loss per week and includes a negative energy balance of [phone] kcal/d;Understanding recommendations for meals to include 15-35% energy as protein, 25-35% energy from fat, 35-60% energy from carbohydrates, less than 200mg  of dietary cholesterol, 20-35 gm of total fiber daily;Understanding of distribution of calorie intake throughout the day with the consumption of 4-5 meals/snacks    Hypertension  Yes    Intervention  Provide education on lifestyle modifcations including regular physical activity/exercise, weight management, moderate sodium restriction and increased consumption of fresh fruit, vegetables, and low fat dairy, alcohol moderation, and smoking cessation.;Monitor prescription use compliance.    Expected Outcomes  Short Term: Continued assessment and intervention until BP is < 140/29mm HG in hypertensive participants. < 130/27mm HG in hypertensive participants with diabetes, heart failure or chronic kidney disease.;Long Term: Maintenance of blood pressure at goal levels.    Lipids  Yes    Intervention  Provide education and support for participant on nutrition & aerobic/resistive exercise along with prescribed medications to achieve LDL 70mg , HDL >40mg .    Expected Outcomes  Short Term: Participant states understanding of desired cholesterol values and is compliant with medications prescribed. Participant is following exercise prescription and nutrition guidelines.;Long Term: Cholesterol controlled with medications as prescribed, with individualized exercise RX and with personalized nutrition plan. Value goals: LDL < 70mg , HDL > 40 mg.       Core Components/Risk Factors/Patient Goals Review:   Goals and Risk Factor Review    Row Name 06/20/18 1404 07/11/18 1451           Core Components/Risk Factors/Patient Goals Review   Personal Goals Review  Weight Management/Obesity;Lipids;Hypertension  Weight Management/Obesity;Lipids;Hypertension      Review  Terry Wright's vital signs have been stable at cardiac rehab. Terry Wright is doing well with exercise. Terry Wright plans to participate in phase 2 cardiac rehab until the end of the year  Terry Wright's vital signs have been stable at cardiac rehab. Terry Wright is doing well with exercise. Terry Wright plans to participate in phase 2 cardiac rehab until the end of the year      Expected Outcomes  Terry Wright will continue to participate in phase 2 cardiac rehab for exercise, diet and lifestyle modifications.  Terry Wright will continue to participate in phase 2 cardiac rehab for exercise, diet and lifestyle modifications.         Core Components/Risk Factors/Patient Goals at Discharge (Final Review):  Goals and Risk Factor Review - 07/11/18 1451      Core Components/Risk Factors/Patient Goals Review   Personal Goals Review  Weight Management/Obesity;Lipids;Hypertension    Review  Terry Wright's vital signs have been stable at cardiac rehab. Terry Wright is doing well with exercise. Terry Wright plans to participate in phase 2 cardiac rehab until the end of the year    Expected Outcomes  Terry Wright will continue to participate in phase 2 cardiac rehab for exercise, diet and lifestyle modifications.       ITP Comments: ITP Comments    Row Name 05/21/18 1506 05/28/18 1410 06/20/18 1401 07/11/18 1450  ITP Comments  Dr. Fransico Him, MD  30 Day ITP Review. Terry Wright tolerated his first day of exercise without difficulty  30 Day ITP Review. Terry Wright is with good participation in cardiac rehab and attend exercise when it does not conflict with his work schedule  30 Day ITP Review. Terry Wright is with good participation in cardiac rehab. Terry Wright has been absent this week witha bad cold       Comments: See ITP comments.Barnet Pall, RN,BSN 07/11/2018 2:53 PM

## 2018-07-12 ENCOUNTER — Encounter (HOSPITAL_COMMUNITY): Payer: 59

## 2018-07-15 ENCOUNTER — Encounter (HOSPITAL_COMMUNITY)
Admission: RE | Admit: 2018-07-15 | Discharge: 2018-07-15 | Disposition: A | Discharge status: Home / Self Care | Payer: 59 | Source: Ambulatory Visit | Attending: Cardiology | Admitting: Cardiology

## 2018-07-15 ENCOUNTER — Encounter: Payer: Self-pay | Admitting: Family Medicine

## 2018-07-15 ENCOUNTER — Encounter (HOSPITAL_COMMUNITY): Payer: 59

## 2018-07-15 DIAGNOSIS — I2510 Atherosclerotic heart disease of native coronary artery without angina pectoris: Secondary | ICD-10-CM | POA: Diagnosis not present

## 2018-07-15 DIAGNOSIS — Z955 Presence of coronary angioplasty implant and graft: Secondary | ICD-10-CM | POA: Diagnosis not present

## 2018-07-15 DIAGNOSIS — I252 Old myocardial infarction: Secondary | ICD-10-CM | POA: Diagnosis not present

## 2018-07-15 DIAGNOSIS — I2121 ST elevation (STEMI) myocardial infarction involving left circumflex coronary artery: Secondary | ICD-10-CM

## 2018-07-17 ENCOUNTER — Encounter (HOSPITAL_COMMUNITY): Payer: 59

## 2018-07-17 ENCOUNTER — Encounter (HOSPITAL_COMMUNITY)
Admission: RE | Admit: 2018-07-17 | Discharge: 2018-07-17 | Disposition: A | Discharge status: Home / Self Care | Payer: 59 | Source: Ambulatory Visit | Attending: Cardiology | Admitting: Cardiology

## 2018-07-17 DIAGNOSIS — Z955 Presence of coronary angioplasty implant and graft: Secondary | ICD-10-CM | POA: Diagnosis not present

## 2018-07-17 DIAGNOSIS — I2121 ST elevation (STEMI) myocardial infarction involving left circumflex coronary artery: Secondary | ICD-10-CM

## 2018-07-18 NOTE — Telephone Encounter (Signed)
error 

## 2018-07-18 NOTE — Telephone Encounter (Signed)
REFILLED IN [place]

## 2018-07-19 ENCOUNTER — Encounter (HOSPITAL_COMMUNITY)
Admission: RE | Admit: 2018-07-19 | Discharge: 2018-07-19 | Disposition: A | Discharge status: Home / Self Care | Payer: 59 | Source: Ambulatory Visit | Attending: Cardiology | Admitting: Cardiology

## 2018-07-19 ENCOUNTER — Encounter (HOSPITAL_COMMUNITY): Payer: 59

## 2018-07-19 DIAGNOSIS — I2121 ST elevation (STEMI) myocardial infarction involving left circumflex coronary artery: Secondary | ICD-10-CM

## 2018-07-19 DIAGNOSIS — Z955 Presence of coronary angioplasty implant and graft: Secondary | ICD-10-CM | POA: Diagnosis not present

## 2018-07-22 ENCOUNTER — Encounter (HOSPITAL_COMMUNITY)
Admission: RE | Admit: 2018-07-22 | Discharge: 2018-07-22 | Disposition: A | Discharge status: Home / Self Care | Payer: 59 | Source: Ambulatory Visit | Attending: Cardiology | Admitting: Cardiology

## 2018-07-22 ENCOUNTER — Encounter (HOSPITAL_COMMUNITY): Payer: 59

## 2018-07-22 VITALS — BP 112/70 | HR 72 | Ht 73.0 in | Wt 214.5 lb

## 2018-07-22 DIAGNOSIS — Z955 Presence of coronary angioplasty implant and graft: Secondary | ICD-10-CM

## 2018-07-22 DIAGNOSIS — I2121 ST elevation (STEMI) myocardial infarction involving left circumflex coronary artery: Secondary | ICD-10-CM

## 2018-07-22 NOTE — Progress Notes (Signed)
Discharge Progress Report  Patient Details  Name: Terry Wright MRN: [id number] Date of Birth: Nov 22, 1955 Referring Provider:     CARDIAC REHAB PHASE II ORIENTATION from 05/21/2018 in [place]  Referring Provider  [name], MD       Number of Visits: 17  Reason for Discharge:  Early Exit:  Insurance and Back to work  Smoking History:  Social History   Tobacco Use  Smoking Status Former Smoker  . Packs/day: 1.00  . Years: 43.00  . Pack years: 43.00  . Last attempt to quit: 2015  . 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 (HCC)  S/P coronary artery stent placement 03/27/18 S/P DES OM3  ADL [facility]:   Initial Exercise Prescription: Initial Exercise Prescription - 05/21/18 1500      Date of Initial Exercise RX and Referring Provider   Date  05/21/18    Referring Provider  [name], MD    Expected Discharge Date  08/30/18      Treadmill   MPH  3.5    Grade  2    Minutes  10    METs  3.8      Bike   Level  1.5    Minutes  10    METs  3.98      NuStep   Level  4    SPM  85    Minutes  10    METs  3.6      Prescription Details   Frequency (times per week)  3    Duration  Progress to 30 minutes of continuous aerobic without signs/symptoms of physical distress      Intensity   THRR 40-80% of Max Heartrate  63-126    Ratings of Perceived Exertion  11-13      Progression   Progression  Continue to progress workloads to maintain intensity without signs/symptoms of physical distress.      Resistance Training   Training Prescription  Yes    Weight  3 lbs    Reps  10-15       Discharge Exercise Prescription (Final Exercise Prescription Changes): Exercise Prescription Changes - 07/22/18 1501      Response to Exercise   Blood Pressure (Admit)  112/70     Blood Pressure (Exercise)  142/82    Blood Pressure (Exit)  102/64    Heart Rate (Admit)  72 bpm    Heart Rate (Exercise)  92 bpm    Heart Rate (Exit)  72 bpm    Rating of Perceived Exertion (Exercise)  10    Symptoms  none    Duration  Progress to 30 minutes of  aerobic without signs/symptoms of physical distress    Intensity  THRR unchanged      Progression   Progression  Continue to progress workloads to maintain intensity without signs/symptoms of physical distress.    Average METs  4      Resistance Training   Training Prescription  Yes    Weight  8lbs    Reps  10-15    Time  10 Minutes      Interval Training   Interval Training  No      Treadmill   MPH  3.7    Grade  2    Minutes  10    METs  4.85      Bike   Level  1.5    Minutes  10    METs  3.92      NuStep   Level  6    SPM  85    Minutes  10    METs  3.3      Home Exercise Plan   Plans to continue exercise at  [facility] (comment)    Frequency  Add 4 additional days to program exercise sessions.    Initial Home Exercises Provided  05/31/18       Functional Capacity: 6 Minute Walk    Row Name 05/22/18 0808 07/17/18 1507       6 Minute Walk   Phase  -  Discharge    Distance  1693 feet  2132 feet Pre walk test distance: 1715f    Distance % Change  4.1 %  21.41 %    Walk Time  6 minutes  6 minutes    # of Rest Breaks  -  0    MPH  3.33  4.04    METS  3.99  4.64    RPE  10  10 Pre RPE: 9    VO2 Peak  13.98  -    Symptoms  No  No    Resting HR  63 bpm  75 bpm Pre: 64    Resting BP  116/78  120/64 Pre: 130/80    Resting Oxygen Saturation   96 %  -    Exercise Oxygen Saturation  during 6 min walk  97 %  -    Max Ex. HR  82 bpm  91 bpm Pre: 85    Max Ex. BP  128/76  128/80 Pre: 134/82    2 Minute Post BP  108/70  122/70       Psychological, QOL, Others - Outcomes: PHQ 2/9[facility] 07/22/2018 07/02/2018 11/20/2016 08/28/2016  Decreased Interest 0 0 0 0  Down,  Depressed, Hopeless 0 0 0 0  PHQ - 2 Score 0 0 0 0    Quality of Life: Quality of Life - 07/22/18 1641      Quality of Life   Select  Quality of Life      Quality of Life Scores   Health/Function Pre  25.85 %    Health/Function Post  28.63 %    Health/Function % Change  10.75 %    Socioeconomic Pre  24 %    Socioeconomic Post  27.81 %    Socioeconomic % Change   15.88 %    Psych/Spiritual Pre  24 %    Psych/Spiritual Post  30 %    Psych/Spiritual % Change  25 %    Family Pre  24 %    Family Post  28.5 %    Family % Change  18.75 %    GLOBAL Pre  24.77 %    GLOBAL Post  28.71 %    GLOBAL % Change  15.91 %       Personal Goals: Goals established at orientation with interventions provided to work toward goal. Personal Goals and Risk Factors at Admission - 05/21/18 1528      Core Components/Risk Factors/Patient Goals on Admission    Weight Management  Yes;Weight Maintenance;Weight Loss    Intervention  Weight Management: Develop a combined nutrition and exercise program designed to reach desired caloric intake, while maintaining appropriate intake of nutrient and fiber, sodium and fats, and  appropriate energy expenditure required for the weight goal.;Weight Management: Provide education and appropriate resources to help participant work on and attain dietary goals.    Admit Weight  213 lb 13.5 oz (97 kg)    Expected Outcomes  Short Term: Continue to assess and modify interventions until short term weight is achieved;Long Term: Adherence to nutrition and physical activity/exercise program aimed toward attainment of established weight goal;Weight Maintenance: Understanding of the daily nutrition guidelines, which includes 25-35% calories from fat, 7% or less cal from saturated fats, less than 256m cholesterol, less than 1.5gm of sodium, & 5 or more servings of fruits and vegetables daily;Weight Loss: Understanding of general recommendations for a balanced deficit meal plan, which  promotes 1-2 lb weight loss per week and includes a negative energy balance of [phone] kcal/d;Understanding recommendations for meals to include 15-35% energy as protein, 25-35% energy from fat, 35-60% energy from carbohydrates, less than 2018mof dietary cholesterol, 20-35 gm of total fiber daily;Understanding of distribution of calorie intake throughout the day with the consumption of 4-5 meals/snacks    Hypertension  Yes    Intervention  Provide education on lifestyle modifcations including regular physical activity/exercise, weight management, moderate sodium restriction and increased consumption of fresh fruit, vegetables, and low fat dairy, alcohol moderation, and smoking cessation.;Monitor prescription use compliance.    Expected Outcomes  Short Term: Continued assessment and intervention until BP is < 140/9081mG in hypertensive participants. < 130/21m53m in hypertensive participants with diabetes, heart failure or chronic kidney disease.;Long Term: Maintenance of blood pressure at goal levels.    Lipids  Yes    Intervention  Provide education and support for participant on nutrition & aerobic/resistive exercise along with prescribed medications to achieve LDL <70mg75mL >40mg.28mExpected Outcomes  Short Term: Participant states understanding of desired cholesterol values and is compliant with medications prescribed. Participant is following exercise prescription and nutrition guidelines.;Long Term: Cholesterol controlled with medications as prescribed, with individualized exercise RX and with personalized nutrition plan. Value goals: LDL < 70mg, 2m> 40 mg.        Personal Goals Discharge: Goals and Risk Factor Review    Row Name 06/20/18 1404 07/11/18 1451 07/22/18 1439         Core Components/Risk Factors/Patient Goals Review   Personal Goals Review  Weight Management/Obesity;Lipids;Hypertension  Weight Management/Obesity;Lipids;Hypertension  Weight  Management/Obesity;Lipids;Hypertension     Review  Chuck's vital signs have been stable at cardiac rehab. Chuck iHarrie Jeansng well with exercise. Chuck plans to participate in phase 2 cardiac rehab until the end of the year  Chuck's vital signs have been stable at cardiac rehab. Chuck iHarrie Jeansng well with exercise. Chuck plans to participate in phase 2 cardiac rehab until the end of the year  Chuck's vital signs have been stable at cardiac rehab. Chuck iHarrie Jeansng well with exercise. Chuck completed cardiac rehab on 07/22/18     Expected Outcomes  Chuck wHarrie Jeansontinue to participate in phase 2 cardiac rehab for exercise, diet and lifestyle modifications.  Chuck wHarrie Jeansontinue to participate in phase 2 cardiac rehab for exercise, diet and lifestyle modifications.  Chuck wHarrie Jeansontinue to walk on his own upon and use handweights upon graduation from phase 2 cardiac rehab         Exercise Goals and Review: Exercise Goals    Row Name 05/21/18 1542             Exercise Goals   Increase Physical Activity  Yes  Intervention  Develop an individualized exercise prescription for aerobic and resistive training based on initial evaluation findings, risk stratification, comorbidities and participant's personal goals.;Provide advice, education, support and counseling about physical activity/exercise needs.       Expected Outcomes  Short Term: Attend rehab on a regular basis to increase amount of physical activity.       Increase Strength and Stamina  Yes       Intervention  Provide advice, education, support and counseling about physical activity/exercise needs.;Develop an individualized exercise prescription for aerobic and resistive training based on initial evaluation findings, risk stratification, comorbidities and participant's personal goals.       Expected Outcomes  Short Term: Increase workloads from initial exercise prescription for resistance, speed, and METs.       Able to understand and use rate of  perceived exertion (RPE) scale  Yes       Intervention  Provide education and explanation on how to use RPE scale       Expected Outcomes  Short Term: Able to use RPE daily in rehab to express subjective intensity level;Long Term:  Able to use RPE to guide intensity level when exercising independently       Intervention  Provide education and explanation of THRR including how the numbers were predicted and where they are located for reference       Expected Outcomes  Short Term: Able to state/look up THRR;Long Term: Able to use THRR to govern intensity when exercising independently;Short Term: Able to use daily as guideline for intensity in rehab       Able to check pulse independently  Yes       Intervention  Provide education and demonstration on how to check pulse in carotid and radial arteries.;Review the importance of being able to check your own pulse for safety during independent exercise       Expected Outcomes  Short Term: Able to explain why pulse checking is important during independent exercise;Long Term: Able to check pulse independently and accurately       Understanding of Exercise Prescription  Yes       Intervention  Provide education, explanation, and written materials on patient's individual exercise prescription       Expected Outcomes  Short Term: Able to explain program exercise prescription;Long Term: Able to explain home exercise prescription to exercise independently          Exercise Goals Re-Evaluation: Exercise Goals Re-Evaluation    Row Name 05/27/18 1456 05/31/18 1512 06/17/18 1443 07/15/18 1516 07/17/18 1517     Exercise Goal Re-Evaluation   Exercise Goals Review  Able to understand and use rate of perceived exertion (RPE) scale;Increase Physical Activity  Able to understand and use rate of perceived exertion (RPE) scale;Increase Physical Activity;Understanding of Exercise Prescription;Able to check pulse independently;Knowledge and understanding of Target Heart Rate  Range (THRR)  Able to understand and use rate of perceived exertion (RPE) scale;Increase Physical Activity;Understanding of Exercise Prescription;Able to check pulse independently;Knowledge and understanding of Target Heart Rate Range (THRR)  Able to understand and use rate of perceived exertion (RPE) scale;Increase Physical Activity;Understanding of Exercise Prescription;Able to check pulse independently;Knowledge and understanding of Target Heart Rate Range (THRR)  Able to understand and use rate of perceived exertion (RPE) scale;Increase Physical Activity;Understanding of Exercise Prescription;Able to check pulse independently;Knowledge and understanding of Target Heart Rate Range (THRR);Increase Strength and Stamina   Comments  Patient able to understand and use RPE scale appropriately.  Reviewed home exericse guidelines  with patient including THRR, RPE scale, and endpoints for exercise. 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 Pt does need left shoulder surgery and is limited with weight lifting because of this.  Patient is walking 30-35 minutes on his TM at home 2-3 days/week. Pt plans to finish up cardiac rehab in mid December and continue exercise on his own at that time.  Pt returns to exercise today after being out with upper respiratory infection. Pt is walking on treadmill at home 30-35 minutes 5-7 days/week.  Patient's functional capacity improved 21% as measured by 6MWT, strength improved 8% as measured by grip strength test.   Expected Outcomes  [place] workloads as tolerted to help improve strength and stamina.  Pt will continue exercise 30-35 minutes 6-7 days/week to help with risk factor reduction and increase cardiorespiratory fitness.  Pt will continue exercise 30-35 minutes 6-7 days/week to help with risk factor reduction and  increase cardiorespiratory fitness.  Pt plans to continue exercise at home upon graduation from the cardiac rehab program.  Patient will exercise 30-35 minutes, 5-7 days/week to maintain health and fitness gains.   [place] Name 07/22/18 1552             Exercise Goal Re-Evaluation   Exercise Goals Review  Able to understand and use rate of perceived exertion (RPE) scale;Increase Physical Activity;Understanding of Exercise Prescription;Able to check pulse independently;Knowledge and understanding of Target Heart Rate Range (THRR);Increase Strength and Stamina       Comments  Patient completed the phase 2 cardiac rehab program has done well, achieving 4.3 peak METs. Pt has a treadmill at home and has access to a gym. Pt plans to exercise 30-35 minutes 6-7 days/week.        Expected Outcomes  Patient will exercise 30-35 minutes, 5-7 days/week to maintain health and fitness gains.          Nutrition & Weight - Outcomes: Pre Biometrics - 05/21/18 1542      Pre Biometrics   Height  '6\' 1"'  (1.854 m)    Weight  213 lb 13.5 oz (97 kg)    Waist Circumference  44.5 inches    Hip Circumference  43.5 inches    Waist to Hip Ratio  1.02 %    BMI (Calculated)  28.22    Triceps Skinfold  29 mm    % Body Fat  32 %    Grip Strength  49 kg    Flexibility  0 in    Single Leg Stand  30 seconds      Post Biometrics - 07/22/18 1503       Post  Biometrics   Height  '6\' 1"'  (1.854 m)    Weight  214 lb 8.1 oz (97.3 kg)    Waist Circumference  42 inches    Hip Circumference  40.5 inches    Waist to Hip Ratio  1.04 %    BMI (Calculated)  28.31    Triceps Skinfold  11 mm    % Body Fat  26.8 %    Grip Strength  53 kg    Flexibility  0 in    Single Leg Stand  21.5 seconds       Nutrition: Nutrition Therapy & Goals - 07/26/18 1337      Nutrition Therapy  Diet  heart healthy      Personal Nutrition Goals   Nutrition Goal  Pt to eat whole grains for breakfast.    Personal Goal #2  Pt to identify and  limit food sources of saturated fat, trans fat, refined carbohydrates and sodium   nutrition goal met, pt following heart healthy diet but no dietary changes made during course of program   Personal Goal #3  Pt to describe the potential benefits of adopting a Heart Healthy diet.    nutrition goal met     Intervention Plan   Intervention  Prescribe, educate and counsel regarding individualized specific dietary modifications aiming towards targeted core components such as weight, hypertension, lipid management, diabetes, heart failure and other comorbidities.    Expected Outcomes  Short Term Goal: Understand basic principles of dietary content, such as calories, fat, sodium, cholesterol and nutrients.;Long Term Goal: Adherence to prescribed nutrition plan.       Nutrition Discharge: Nutrition Assessments - 07/25/18 1334      MEDFICTS Scores   Pre Score  26    Post Score  26    Score Difference  0       Education Questionnaire Score: Knowledge Questionnaire Score - 07/22/18 1451      Knowledge Questionnaire Score   Pre Score  21/24    Post Score  19/24        Goals reviewed with patient; copy given to patient. Chuck graduated from cardiac rehab program on12/23/19 with completion of 17 exercise sessions in Phase II. Pt maintained good attendance and progressed nicely during his participation in rehab as evidenced by increased MET level.   Medication list reconciled. Repeat  PHQ score- 0 .  Pt has made significant lifestyle changes and should be commended for his success. Pt feels he has achieved his goals during cardiac rehab.   Pt plans to continue exercise by walking on his own and using hand weights. Chuck increased his distance on his post exercise walk test. We are proud of Chuck's progress!Barnet Pall, RN,BSN 08/06/2018 1:41 PM

## 2018-07-26 ENCOUNTER — Encounter (HOSPITAL_COMMUNITY): Payer: 59

## 2018-07-29 ENCOUNTER — Encounter (HOSPITAL_COMMUNITY): Payer: 59

## 2018-08-02 ENCOUNTER — Encounter (HOSPITAL_COMMUNITY): Payer: 59

## 2018-08-05 ENCOUNTER — Encounter (HOSPITAL_COMMUNITY): Payer: 59

## 2018-08-07 ENCOUNTER — Encounter (HOSPITAL_COMMUNITY): Payer: 59

## 2018-08-09 ENCOUNTER — Encounter (HOSPITAL_COMMUNITY): Payer: 59

## 2018-08-12 ENCOUNTER — Encounter (HOSPITAL_COMMUNITY): Payer: 59

## 2018-08-14 ENCOUNTER — Encounter (HOSPITAL_COMMUNITY): Payer: 59

## 2018-08-15 ENCOUNTER — Other Ambulatory Visit: Payer: Self-pay | Admitting: Cardiology

## 2018-08-16 ENCOUNTER — Encounter (HOSPITAL_COMMUNITY): Payer: 59

## 2018-08-19 ENCOUNTER — Encounter (HOSPITAL_COMMUNITY): Payer: 59

## 2018-08-21 ENCOUNTER — Encounter (HOSPITAL_COMMUNITY): Payer: 59

## 2018-08-23 ENCOUNTER — Encounter (HOSPITAL_COMMUNITY): Payer: 59

## 2018-08-26 ENCOUNTER — Encounter (HOSPITAL_COMMUNITY): Payer: 59

## 2018-08-28 ENCOUNTER — Encounter (HOSPITAL_COMMUNITY): Payer: 59

## 2018-08-28 ENCOUNTER — Other Ambulatory Visit: Payer: Self-pay | Admitting: Cardiology

## 2018-10-31 ENCOUNTER — Telehealth: Payer: Self-pay | Admitting: *Deleted

## 2018-10-31 NOTE — Telephone Encounter (Signed)
LEFT MESSAGE TO CAL BACK TO DISCUSS SETTING UP E VISIT  FOR 4/6 APPT  W/HARDING

## 2018-10-31 NOTE — Telephone Encounter (Signed)
New Message          Patient is returning your call

## 2018-11-01 NOTE — Telephone Encounter (Signed)
   TELEPHONE CALL NOTE  This patient has been deemed a candidate for follow-up tele-health visit to limit community exposure during the Covid-19 pandemic. I spoke with the patient via phone to discuss instructions.   . The patient was advised to review the section on consent for treatment as well. The patient will receive a phone call 2-3 days prior to their E-Visit at which time consent will be verbally confirmed.   A Virtual Office Visit appointment type has been scheduled for 1:40 PM  with dr Rema Fendt, - patient prefers  Video type.  I have confirmed the patient is active in [place], [place], [place] 11/01/2018 4:37 PM

## 2018-11-04 ENCOUNTER — Encounter: Payer: Self-pay | Admitting: Cardiology

## 2018-11-04 ENCOUNTER — Other Ambulatory Visit: Payer: Self-pay

## 2018-11-04 ENCOUNTER — Telehealth (INDEPENDENT_AMBULATORY_CARE_PROVIDER_SITE_OTHER): Payer: 59 | Admitting: Cardiology

## 2018-11-04 VITALS — BP 118/70 | HR 57 | Ht 73.0 in | Wt 215.0 lb

## 2018-11-04 DIAGNOSIS — I498 Other specified cardiac arrhythmias: Secondary | ICD-10-CM

## 2018-11-04 DIAGNOSIS — Z9861 Coronary angioplasty status: Secondary | ICD-10-CM

## 2018-11-04 DIAGNOSIS — I10 Essential (primary) hypertension: Secondary | ICD-10-CM

## 2018-11-04 DIAGNOSIS — I2510 Atherosclerotic heart disease of native coronary artery without angina pectoris: Secondary | ICD-10-CM | POA: Diagnosis not present

## 2018-11-04 DIAGNOSIS — E785 Hyperlipidemia, unspecified: Secondary | ICD-10-CM

## 2018-11-04 DIAGNOSIS — I255 Ischemic cardiomyopathy: Secondary | ICD-10-CM

## 2018-11-04 DIAGNOSIS — I499 Cardiac arrhythmia, unspecified: Secondary | ICD-10-CM

## 2018-11-04 DIAGNOSIS — F410 Panic disorder [episodic paroxysmal anxiety] without agoraphobia: Secondary | ICD-10-CM

## 2018-11-04 MED ORDER — CARVEDILOL 12.5 MG PO TABS: 12.5000 mg | ORAL_TABLET | Freq: Two times a day (BID) | ORAL | 3 refills | Status: DC

## 2018-11-04 NOTE — Assessment & Plan Note (Signed)
S: No further anginal symptoms since PCI in August 2019.  Back active doing well.  Completed cardiac rehab.  No bleeding on aspirin plus Plavix.  Asked about timing of potential upcoming colonoscopy.  A/P: Stable.  Doing well.  For now, continue aspirin plus Plavix, but would be okay to hold aspirin for bruising etc.   For urgent/emergent procedures would be okay to hold Plavix 5 to 7 days preprocedure.  Would prefer to wait until after August 2020 to hold Plavix otherwise.  At that time would hold 5 to 7 days preprocedure and restart 2 days post.  Continue current dose of statin and beta-blocker.

## 2018-11-04 NOTE — Progress Notes (Signed)
Virtual Visit via Video Note   This visit type was conducted due to national recommendations for restrictions regarding the COVID-19 Pandemic (e.g. social distancing) in an effort to limit this patient's exposure and mitigate transmission in our community.  Due to his co-morbid illnesses, this patient is at least at moderate risk for complications without adequate follow up.  This format is felt to be most appropriate for this patient at this time.  All issues noted in this document were discussed and addressed.  A limited physical exam was performed with this format.  Please refer to the patient's chart for his consent to telehealth for [facility].   Evaluation Performed:  Follow-up visit  Date:  11/04/2018   ID:  Terry Wright, Terry Wright [birth year], MRN [id number]  Patient Location: Home  Provider Location: Home  PCP:  Marin Olp, MD  Cardiologist:  [name], MD  Electrophysiologist:  None   Chief Complaint:  ~6 month f/u for CAD-PCI.  History of Present Illness:    Terry Wright is a 63 y.o. male who presents via audio/video conferencing for a telehealth visit today.    Cardiac History:  Inferior-posterior STEMI,/VT arrest 08/06/2016 --> Shock x 2 for VT - ROSC -> CATH - 100% pCx - DES PCI (Synergy DES 4.0 x 16 - 4.6 mm)  ?  Initial EF by LV gram was 35-40% increase to 40 and 45% by echo ? 2-D echo 01/11/2017: EF 50-55%. GR 1 DD. Mild to moderate LA dilation.  Unstable Angina - Cath 03/27/2018: patent stent --> Cx-OM2 95% ([place]) -- DES PCI. ~70% dRCA trifurcation (med Rx).   Terry Wright was last seen on May 06, 2018 a second follow-up from his hospitalization with PCI to the distal circumflex back in August. -->  Other than chest pain symptoms, he also noted significant palpitations/PVCs that were resolved following PCI. --Please asked several questions about his CAD and how long the stents were work etc., he was doing fine.  No  further angina or dyspnea, no palpitations. --He went to cardiac rehab completing in December. --Plan was to monitor for symptoms and otherwise treat RCA medically. --Converted to rosuvastatin from atorvastatin because of myalgias.  Palpitations noted in [facility] -- got nervous.  But better since no longer @[facility] .  INTERVAL HISTORY: Chart is doing very well today.  He has not had any further symptoms.  Basically since he stopped going to cardiac rehab, he has not had any more the palpitations.  He seems to think this was probably related to being anxious about being at the hospital as opposed to actually truly having symptoms.  Ever since we switched him from his Brilinta to Plavix, he is not having any more dyspnea symptoms.  He is not having any chest pain or pressure with rest of exertion, the palpitations have resolved. He is not been able to do any gym workouts, but is still exercising on TM @ home.  Not able to work since [place].  He does do some of his yard work as well and is doing fine.  Pretty much stable from cardiac standpoint: Cardiovascular ROS: no chest pain or dyspnea on exertion negative for - edema, irregular heartbeat, orthopnea, palpitations, paroxysmal nocturnal dyspnea, rapid heart rate, shortness of breath or TIA/amaurosis fugax.  Syncope/near syncope. Melena, hematochezia, hematuria or epistaxis.  ROS:   Please see the history of present illness.    Review of Systems  Constitutional: Negative for chills, fever, malaise/fatigue and weight loss.  HENT: Negative for congestion and nosebleeds.   Respiratory: Negative for cough, shortness of breath and wheezing.   Cardiovascular: Negative for chest pain.  Gastrointestinal: Negative for abdominal pain, blood in stool, constipation, heartburn and melena.  Genitourinary: Negative for dysuria and hematuria.  Musculoskeletal: Positive for joint pain. Negative for falls.  Neurological: Negative for dizziness, weakness and headaches.   Psychiatric/Behavioral: Negative for depression and memory loss. The patient is nervous/anxious. The patient does not have insomnia.   All other systems reviewed and are negative.   --Left shoulder, right knee.  Easy bruising.  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   Bilateral Hip Pain  The patient does not have symptoms concerning for COVID-19 infection (fever, chills, cough, or new shortness of breath).  Now wearing mask & gloves if he has to go out - but limiting this.  Has social distancing court group meetings.    Past Medical History:  Diagnosis Date  . CAD S/P percutaneous coronary angioplasty 07/2016, 02/2018   a) 100% pCx -- DES PCI with 4.0 x 16 mm Synergy DES. 65% ostial OM 3 (relatively small caliber - Med Rx);; b) 03/27/18 PCI/DES to dLCX/OM3 x1. Patent pCX DES. 700% dRCA trifurcation lesion (Med Rx). Normal EF  . DVT (deep venous thrombosis) (HCC)    Right upper arm DVT on 2 occasions in the remote past  . Ejection fraction    LV function normal, echo, February, 2010  . GERD (gastroesophageal reflux disease)   . Hyperlipidemia   . Hypertension   . Mitral regurgitation    Mild, echo, February, 2010  . Palpitations    Mild in the past  . Statin intolerance    Felt poorly after Lipitor, Crestor, TriCor  . STEMI involving left circumflex coronary artery ([place]) 08/04/2016   Occluded very large caliber codominant circumflex - PCI with single Synergy DES  . Tobacco abuse    Past Surgical History:  Procedure Laterality Date  . CARDIAC CATHETERIZATION N/A 08/04/2016   Procedure: Left Heart Cath and Coronary Angiography;  Surgeon: Leonie Man, MD;  Location: [place] [facility];  Service: Cardiovascular;  Laterality: N/A: 100% pCx --> PCI. residual 65% pOM3 (small).  ~EF 35-45%  with lateral HK.  Marland Kitchen CARDIAC CATHETERIZATION N/A 08/04/2016    Procedure: Coronary Stent Intervention;  Surgeon: Leonie Man, MD;  Location: [place] [facility];  Service: Cardiovascular;  Laterality: N/A: pCx 100%-0%: Synergy DES 4.0 x 16 (4.6 mm)  . CHONDROPLASTY Right 04/04/2017   Procedure: CHONDROPLASTY;  Surgeon: Dorna Leitz, MD;  Location: [facility];  Service: Orthopedics;  Laterality: Right;  . CORONARY STENT INTERVENTION N/A 03/27/2018   Procedure: CORONARY STENT INTERVENTION;  Surgeon: Martinique, Peter M, MD;  Location: [place] [facility];  Service: Cardiovascular:: dCx-OM3 95%-0%: DES PCI - Synergy 2.25 x 16 mm.  Marland Kitchen GANGLION CYST EXCISION     oct 2017  . HERNIA REPAIR     umbilical hernia 2017 oct  . KNEE ARTHROSCOPY Right 04/04/2017   Procedure: ARTHROSCOPY RIGHT KNEE, WITH MEIDAL FEMORAL CONDYLE PATELLAR MEDIAL FEMORAL JOINT PLICA EXCISION ;  Surgeon: Dorna Leitz, MD;  Location: [facility];  Service: Orthopedics;  Laterality: Right;  . LEFT HEART CATH AND CORONARY ANGIOGRAPHY N/A 03/27/2018   Procedure: LEFT HEART CATH AND CORONARY ANGIOGRAPHY;  Surgeon: Martinique, Peter M, MD;  Location: [place] [facility];  Service: Cardiovascular:  2 vessel obstructive CAD: - 95% dLCx/OM3 --Successful PCI of the dLCx/OM3 with SYNERGY DES 2.25X16, -70% distal RCA at trifurcation (Med Management).  Widely patent pLCx DES. Low normal LV function (50-55%), Normal LVEDP   . TRANSTHORACIC ECHOCARDIOGRAM  08/05/2016   post STEMI: EF 40-45%. Mild concentric LVH. Severe HK of basal-mid inferolateral wall consistent with infarct in this distribution. GR 1 DD  . TRANSTHORACIC ECHOCARDIOGRAM  01/11/2017   EF 50-55%. GR 1 DD. Mild to moderate LA dilation.    POST PCI 07/2016:      Cath-PCI 02/2018: 1. 2 vessel obstructive CAD     - 95% distal LCx/OM3   Successful PCI of the distal LCx/OM3 with DES     -70% distal RCA at trifurcation - Med Rx. 2. Widely patent stent in the proximal LCx 3. Low normal LV function 4. Normal LVEDP          Current Meds  Medication Sig  .  acetaminophen (TYLENOL) 500 MG tablet Take 1,000 mg by mouth every 6 (six) hours as needed for headache (pain).  Marland Kitchen aspirin EC 81 MG EC tablet Take 1 tablet (81 mg total) by mouth daily.  . clopidogrel (PLAVIX) 75 MG tablet TAKE 1 TABLET BY MOUTH  DAILY  . LORazepam (ATIVAN) 0.5 MG tablet Take 0.5 mg by mouth 2 (two) times daily.   . nitroGLYCERIN (NITROSTAT) 0.4 MG SL tablet Place 1 tablet (0.4 mg total) under the tongue every 5 (five) minutes as needed for chest pain.  . pantoprazole (PROTONIX) 40 MG tablet TAKE 1 TABLET BY MOUTH  DAILY  . rosuvastatin (CRESTOR) 20 MG tablet TAKE 1 TABLET BY MOUTH  DAILY  . [DISCONTINUED] carvedilol (COREG) 6.25 MG tablet Take 6.25 mg by mouth 2 (two) times daily with a meal. Pt takes 2 tablets in the morning and 2 tablets at night     Allergies:   Patient has no known allergies.   Social History   Tobacco Use  . Smoking status: Former Smoker    Packs/day: 1.00    Years: 43.00    Pack years: 43.00    Last attempt to quit: 2015    Years since quitting: 5.2  . Smokeless tobacco: Never Used  . Tobacco comment: Former smoker quit 2015  Substance Use Topics  . Alcohol use: Yes    Alcohol/week: 7.0 standard drinks    Types: 7 Glasses of wine per week    Comment: occasional  . Drug use: No     Family Hx: The patient's family history includes Heart attack in his mother; Hypertension in his father; Other in his brother.   Prior CV studies:   The following studies were reviewed today: No new studies  Labs/Other Tests and Data Reviewed:    EKG:  No ECG reviewed.  Recent Labs: 03/27/2018: Magnesium 2.0 03/28/2018: BUN 9; Creatinine, Ser 0.89; Hemoglobin 14.2; Platelets 145; Potassium 3.8; Sodium 140 04/11/2018: ALT 19   Recent Lipid Panel Lab Results  Component Value Date/Time   CHOL 106 03/27/2018 04:22 AM   CHOL 110 05/09/2017 08:21 AM   TRIG 99 03/27/2018 04:22 AM   HDL 30 (L) 03/27/2018 04:22 AM   HDL 35 (L) 05/09/2017 08:21 AM   CHOLHDL  3.5 03/27/2018 04:22 AM   LDLCALC 56 03/27/2018 04:22 AM   LDLCALC 46 05/09/2017 08:21 AM   LDLDIRECT 159.1 06/05/2011 09:48 AM    Wt Readings from Last 3 Encounters:  11/04/18 215 lb (97.5 kg)  07/22/18 214 lb 8.1 oz (  97.3 kg)  07/02/18 216 lb (98 kg)     Objective:    Vital Signs:  BP 118/70   Pulse (!) 57   Ht 6\' 1"  (1.854 m)   Wt 215 lb (97.5 kg)   BMI 28.37 kg/m    Limited physical exam via video evaluation. General appearance: alert, cooperative, appears stated age and Well nourished, well developed male in no overt acute distress. Neck: no JVD Lungs: Nonlabored.  No signs of accessory muscle use Neurologic: Alert and oriented X 3, normal strength and tone. Normal symmetric reflexes. Normal coordination and gait   ASSESSMENT & PLAN:    Problem List Items Addressed This Visit    CAD S/P percutaneous coronary angioplasty - Primary (Chronic)    S: No further anginal symptoms since PCI in August 2019.  Back active doing well.  Completed cardiac rehab.  No bleeding on aspirin plus Plavix.  Asked about timing of potential upcoming colonoscopy.  A/P: Stable.  Doing well.  For now, continue aspirin plus Plavix, but would be okay to hold aspirin for bruising etc.   For urgent/emergent procedures would be okay to hold Plavix 5 to 7 days preprocedure.  Would prefer to wait until after August 2020 to hold Plavix otherwise.  At that time would hold 5 to 7 days preprocedure and restart 2 days post.  Continue current dose of statin and beta-blocker.      Relevant Medications   carvedilol (COREG) 12.5 MG tablet   Other Relevant Orders   Comprehensive metabolic panel   Dyslipidemia, goal LDL below 50 (Chronic)    S: Lipids from August looked great.  Close to our target goal of less than 50.    A: Doing much better on rosuvastatin and atorvastatin.  Plan: Continue current dose of rosuvastatin.  Would be due to follow-up labs in August timeframe prior to September follow-up.   If lipid levels are going the wrong direction, would add Zetia.      Relevant Medications   carvedilol (COREG) 12.5 MG tablet   Other Relevant Orders   Lipid panel   Comprehensive metabolic panel   Essential hypertension (Chronic)    Excellent blood pressure on current meds.  No change. Likely consolidating carvedilol dosing to 12.5 mg tablets twice daily as opposed to 6.25 mg 2 tab twice daily      Relevant Medications   carvedilol (COREG) 12.5 MG tablet   Ischemic cardiomyopathy (Chronic)    Essentially resolved post revascularization.  Stable.  Euvolemic.   No diuretic requirement.  No heart failure symptoms.      Relevant Medications   carvedilol (COREG) 12.5 MG tablet   Panic attacks    Doing well on SSRI with PRN lorazepam.  Follow-up with PCP.  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      Ventricular bigeminy (Chronic)    Doing better with increased rate of beta-blocker.  Plan: Continue carvedilol 12.5 mg twice daily (will change prescription to indicate 12.5 mg twice daily as opposed to 625 mg tablets 2 tablets twice daily)      Relevant Medications   carvedilol (COREG) 12.5 MG tablet      COVID-19 Education: The signs and symptoms of COVID-19 were discussed with the patient and how to seek care for testing (follow up with PCP or arrange E-visit).  The importance of social distancing was discussed today.  Time:   Today, I have spent 21 minutes  with the patient with telehealth technology discussing the above problems and follow-up.  We also discussed social distancing concerns.   Medication Adjustments/Labs and Tests Ordered: Current medicines are reviewed at length with the patient today.  Concerns regarding medicines are outlined above.  Tests Ordered: Orders Placed This Encounter  Procedures  . Lipid panel  . Comprehensive metabolic panel   Medication Changes: Meds ordered this encounter   Medications  . carvedilol (COREG) 12.5 MG tablet    Sig: Take 1 tablet (12.5 mg total) by mouth 2 (two) times daily.    Dispense:  180 tablet    Refill:  3    Discontinue 6.25 mg dose    Disposition:  Follow up in 5 month(s)  Signed, [name], MD  11/04/2018 2:22 PM    [facility]

## 2018-11-04 NOTE — Assessment & Plan Note (Signed)
S: Lipids from August looked great.  Close to our target goal of less than 50.    A: Doing much better on rosuvastatin and atorvastatin.  Plan: Continue current dose of rosuvastatin.  Would be due to follow-up labs in August timeframe prior to September follow-up.  If lipid levels are going the wrong direction, would add Zetia.

## 2018-11-04 NOTE — Assessment & Plan Note (Addendum)
Essentially resolved post revascularization.  Stable.  Euvolemic.   No diuretic requirement.  No heart failure symptoms.

## 2018-11-04 NOTE — Assessment & Plan Note (Signed)
Doing better with increased rate of beta-blocker.  Plan: Continue carvedilol 12.5 mg twice daily (will change prescription to indicate 12.5 mg twice daily as opposed to 625 mg tablets 2 tablets twice daily)

## 2018-11-04 NOTE — Assessment & Plan Note (Signed)
Excellent blood pressure on current meds.  No change. Likely consolidating carvedilol dosing to 12.5 mg tablets twice daily as opposed to 6.25 mg 2 tab twice daily

## 2018-11-04 NOTE — Patient Instructions (Addendum)
Medication Instructions:  Will go ahead & change Carvedilol to 12.5 mg BID (change Rx) - not a change to dosing - just Rx. A new prescription was sent to the pharmacy ninety day supply with refills  If you need a refill on your cardiac medications before your next appointment, please call your pharmacy.     Lab work: Need Labs in [place], will you may labslip If you have labs (blood work) drawn today and your tests are completely normal, you will receive your results only by: Marland Kitchen MyChart Message (if you have MyChart) OR . A paper copy in the mail If you have any lab test that is abnormal or we need to change your treatment, we will call you to review the results.    Testing/Procedures: NOT NEEDED   Follow-Up: At [facility], you and your health needs are our priority.  As part of our continuing mission to provide you with exceptional heart care, we have created designated Provider Care Teams.  These Care Teams include your primary Cardiologist (physician) and Advanced Practice Providers (APPs -  Physician Assistants and Nurse Practitioners) who all work together to provide you with the care you need, when you need it. You will need a follow up appointment in 5 months SEPT 2020.  Please call our office 2 months in advance to schedule this appointment.  You may see [name], MD or one of the following Advanced Practice Providers on your designated Care Team:   [name], PA-C . Jory Sims, DNP, ANP  Any Other Special Instructions Will Be Listed Below.  If possible hold of til after August for Colonoscopy.

## 2018-11-04 NOTE — Assessment & Plan Note (Signed)
Doing well on SSRI with PRN lorazepam.  Follow-up with PCP.  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

## 2018-11-06 ENCOUNTER — Other Ambulatory Visit: Payer: Self-pay | Admitting: Cardiology

## 2018-11-06 ENCOUNTER — Other Ambulatory Visit: Payer: Self-pay | Admitting: Family Medicine

## 2018-11-06 MED ORDER — CARVEDILOL 12.5 MG PO TABS: 12.5000 mg | ORAL_TABLET | Freq: Two times a day (BID) | ORAL | 3 refills | Status: DC

## 2018-11-06 NOTE — Telephone Encounter (Signed)
New Message    *STAT* If patient is at the pharmacy, call can be transferred to refill team.   1. Which medications need to be refilled? (please list name of each medication and dose if known) Carvedilol 12.5mg    2. Which pharmacy/location (including street and city if local pharmacy) is medication to be sent to? [facility]   3. Do they need a 30 day or 90 day supply? 90 Day

## 2018-11-06 NOTE — Telephone Encounter (Signed)
Refilled carvedilol 12.5 mg to [facility] per patient request.

## 2018-11-07 ENCOUNTER — Encounter: Payer: Self-pay | Admitting: Family Medicine

## 2018-11-07 ENCOUNTER — Ambulatory Visit (INDEPENDENT_AMBULATORY_CARE_PROVIDER_SITE_OTHER): Payer: 59 | Admitting: Family Medicine

## 2018-11-07 VITALS — BP 148/84 | HR 57 | Temp 95.5°F | Ht 73.0 in | Wt 215.0 lb

## 2018-11-07 DIAGNOSIS — J432 Centrilobular emphysema: Secondary | ICD-10-CM

## 2018-11-07 DIAGNOSIS — I10 Essential (primary) hypertension: Secondary | ICD-10-CM | POA: Diagnosis not present

## 2018-11-07 DIAGNOSIS — F410 Panic disorder [episodic paroxysmal anxiety] without agoraphobia: Secondary | ICD-10-CM | POA: Diagnosis not present

## 2018-11-07 MED ORDER — LORAZEPAM 0.5 MG PO TABS: 0.5000 mg | ORAL_TABLET | Freq: Two times a day (BID) | ORAL | 1 refills | Status: DC | PRN

## 2018-11-07 NOTE — Telephone Encounter (Signed)
Left message to return phone call. Pt needs an ov for further refills. Last refill was 10/30/2017. Last office visit 06/2018.

## 2018-11-07 NOTE — Progress Notes (Signed)
Phone [phone]   Subjective:  Virtual visit via Video note. Chief complaint: Chief Complaint  Patient presents with  . Medication Refill    This visit type was conducted due to national recommendations for restrictions regarding the COVID-19 Pandemic (e.g. social distancing).  This format is felt to be most appropriate for this patient at this time balancing risks to patient and risks to population by having him in for in person visit.  All issues noted in this document were discussed and addressed.  No physical exam was performed (except for noted visual exam or audio findings with Telehealth visits).  The patient has consented to conduct a Telehealth visit and understands insurance will be billed.   Our team/I connected with Areta Haber Boss on 11/07/18 at 11:00 AM EDT by a video enabled telemedicine application ([email or web address]) and verified that I am speaking with the correct person using two identifiers.  Location patient: Home-[facility] Location provider: [facility], office Persons participating in the virtual visit:  patient  Our team/I discussed the limitations of evaluation and management by telemedicine and the availability of in person appointments. In light of current covid-19 pandemic, patient also understands that we are trying to protect them by minimizing in office contact if at all possible.  The patient expressed consent for telemedicine visit and agreed to proceed. Patient understands insurance will be billed.   ROS-no palpitations or chest pain.  Does have anxiety from time to time which makes sleep difficult.  No shortness of breath reported.  Past Medical History-  Patient Active Problem List   Diagnosis Date Noted  . CAD S/P percutaneous coronary angioplasty 09/21/2016    Priority: High  . Panic attacks 09/21/2016    Priority: High  . History of DVT (deep vein thrombosis)     Priority: High  . Former smoker     Priority: High  . Centrilobular emphysema ([place])  09/04/2017    Priority: Medium  . Hyperglycemia 09/21/2016    Priority: Medium  . Dyslipidemia, goal LDL below 50     Priority: Medium  . Essential hypertension     Priority: Medium  . Mitral regurgitation     Priority: Low  . GERD (gastroesophageal reflux disease)     Priority: Low  . Ventricular bigeminy     Priority: Low  . Unstable angina ([place])   . Chest pain 03/25/2018  . Aortic atherosclerosis ([place]) 09/04/2017  . Acute medial meniscus tear of right knee 04/04/2017  . 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 Ischemic cardiomyopathy 10/12/2016  . STEMI involving left circumflex coronary artery ([place]) -complicated by cardiac arrest 08/04/2016    Medications- reviewed and updated Current Outpatient Medications  Medication Sig Dispense Refill  . acetaminophen (TYLENOL) 500 MG tablet Take 1,000 mg by mouth every 6 (six) hours as needed for headache (pain).    Marland Kitchen aspirin EC 81 MG EC tablet Take 1 tablet (81 mg total) by mouth daily.    . carvedilol (COREG) 12.5 MG tablet Take 1 tablet (12.5 mg total) by mouth 2 (two) times daily. 180 tablet 3  . clopidogrel (PLAVIX) 75 MG tablet TAKE 1 TABLET BY MOUTH  DAILY 90 tablet 1  . LORazepam (ATIVAN) 0.5 MG tablet Take 0.5 mg by mouth 2 (two) times daily. Pt taking as needed    . nitroGLYCERIN (NITROSTAT) 0.4 MG SL tablet Place 1 tablet (0.4 mg total) under the tongue every 5 (five) minutes as needed for chest pain. 25 tablet 1  . pantoprazole (PROTONIX) 40 MG  tablet TAKE 1 TABLET BY MOUTH  DAILY 90 tablet 0  . rosuvastatin (CRESTOR) 20 MG tablet TAKE 1 TABLET BY MOUTH  DAILY 90 tablet 1  . LORazepam (ATIVAN) 0.5 MG tablet Take 1 tablet (0.5 mg total) by mouth 2 (two) times daily as needed for anxiety. #45 is supposed to be 3 month supply 45 tablet 1   No current facility-administered medications for this visit.      Objective:  BP (!) 148/84   Pulse (!) 57   Temp (!) 95.5 F (35.3 C) (Oral)   Ht 6\' 1"  (1.854 m)   Wt 215 lb (97.5 kg)    BMI 28.37 kg/m  Gen: NAD, resting comfortably Lungs: nonlabored, normal respiratory rate  Skin: appears dry, no obvious rash Normal speech     Assessment and Plan   #hypertension S: controlled on coreg 12.5 mg BID just 3 days ago with cardiology. 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  Used to have to take lorazepam on a nightly basis as mind would race and would get really worked up.  Fortunately since retirement he has done better.  He states he mainly only uses Ativan when he wakes up in the middle of night around 2 PM with mind racing.  He thinks number 45 pills would last him for 3 months A/P:  Stable. Continue current medications.  Filled 23-month supply to mail order with #45.  He would likely touch base if he had worsening symptoms and needed more frequent dosing.  Other notes: 1.  Wife works for [employer]- she is wearing scrubs and trying to "decontaminate" when she gets home.  He would be high risk with age, CAD, emphysema (though only noted on ct scan and no current evidence of issues) for COVID-19 so he is staying indoors as much as possible to protect himself  Meds ordered this encounter  Medications  . LORazepam (ATIVAN) 0.5 MG tablet    Sig: Take 1 tablet (0.5 mg total) by mouth 2 (two) times daily as needed for anxiety. #45 is supposed to be 3 month supply    Dispense:  45 tablet    Refill:  1    Return precautions advised.  Garret Reddish, MD

## 2018-11-07 NOTE — Patient Instructions (Addendum)
Health Maintenance Due  Topic Date Due  . COLONOSCOPY  Per pt he discuss this with Cardiologist and it was recommended for the pt to do this in Aug. Year out from stent 09/06/2015    Video visit

## 2018-11-22 ENCOUNTER — Other Ambulatory Visit: Payer: Self-pay | Admitting: Cardiology

## 2018-11-25 NOTE — Telephone Encounter (Signed)
Pantoprazole refilled. 

## 2019-02-13 ENCOUNTER — Other Ambulatory Visit: Payer: Self-pay | Admitting: Cardiology

## 2019-03-19 ENCOUNTER — Other Ambulatory Visit: Payer: Self-pay

## 2019-03-19 DIAGNOSIS — Z20822 Contact with and (suspected) exposure to covid-19: Secondary | ICD-10-CM

## 2019-03-20 LAB — NOVEL CORONAVIRUS, NAA: SARS-CoV-2, NAA: NOT DETECTED

## 2019-04-22 ENCOUNTER — Telehealth: Payer: Self-pay | Admitting: *Deleted

## 2019-04-22 NOTE — Telephone Encounter (Signed)
   Primary Cardiologist: [name], MD   Pt contacted.  History and symptoms reviewed.  Pt will f/u with [facility] provider as scheduled.  Pt. advised that we are restricting visitors at this time and request that only patients present for check-in prior to their appointment.  All other visitors should remain in their car.  If necessary, only one visitor may come with the patient, into the building. For everyone's safety, all patients and visitor entering our practice area should expect to be screened again prior to entering our waiting area.wear face covering  Change time to 3:20 pm   Raiford Simmonds, RN  04/22/2019 2:17 PM

## 2019-04-29 ENCOUNTER — Ambulatory Visit: Payer: 59 | Admitting: Cardiology

## 2019-04-29 ENCOUNTER — Other Ambulatory Visit: Payer: Self-pay

## 2019-04-29 ENCOUNTER — Encounter: Payer: Self-pay | Admitting: Family Medicine

## 2019-04-29 ENCOUNTER — Encounter: Payer: Self-pay | Admitting: Cardiology

## 2019-04-29 VITALS — BP 143/91 | HR 72 | Temp 98.1°F | Ht 73.0 in | Wt 205.0 lb

## 2019-04-29 DIAGNOSIS — I255 Ischemic cardiomyopathy: Secondary | ICD-10-CM

## 2019-04-29 DIAGNOSIS — E785 Hyperlipidemia, unspecified: Secondary | ICD-10-CM | POA: Diagnosis not present

## 2019-04-29 DIAGNOSIS — I10 Essential (primary) hypertension: Secondary | ICD-10-CM

## 2019-04-29 DIAGNOSIS — I200 Unstable angina: Secondary | ICD-10-CM

## 2019-04-29 DIAGNOSIS — Z9861 Coronary angioplasty status: Secondary | ICD-10-CM

## 2019-04-29 DIAGNOSIS — I2510 Atherosclerotic heart disease of native coronary artery without angina pectoris: Secondary | ICD-10-CM

## 2019-04-29 NOTE — Patient Instructions (Addendum)
Medication Instructions:  NO CHANGES    If you need a refill on your cardiac medications before your next appointment, please call your pharmacy.   Lab work: LIPID CMP  If you have labs (blood work) drawn today and your tests are completely normal, you will receive your results only by: Marland Kitchen MyChart Message (if you have MyChart) OR . A paper copy in the mail If you have any lab test that is abnormal or we need to change your treatment, we will call you to review the results.  Testing/Procedures:  NOT NEEDED   Follow-Up: At [facility], you and your health needs are our priority.  As part of our continuing mission to provide you with exceptional heart care, we have created designated Provider Care Teams.  These Care Teams include your primary Cardiologist (physician) and Advanced Practice Providers (APPs -  Physician Assistants and Nurse Practitioners) who all work together to provide you with the care you need, when you need it. . You will need a follow up appointment in  6   [facility] 2021.  Please call our office 2 months in advance to schedule this appointment.  You may see [name], MD or one of the following Advanced Practice Providers on your designated Care Team:   . [name], PA-C . Jory Sims, DNP, ANP  Any Other Special Instructions Will Be Listed Below (If Applicable).

## 2019-04-29 NOTE — Progress Notes (Signed)
PCP: Marin Olp, MD  Clinic Note: Chief Complaint  Patient presents with  . Follow-up    In person visit after virtual  . Coronary Artery Disease    HPI: Terry Wright is a 63 y.o. male with a PMH of CAD having PCI in the setting of inferior STEMI/VT arrest who presents today for 29-month follow-up.  Cardiac History:  Inferior-posterior STEMI/VT arrest 08/06/2016--> Shock x 2 for VT - ROSC -> CATH - 100% pCx - DES PCI (Synergy DES 4.0 x 16 - 4.6 mm) ? Initial EF by LV gram was 35-40% increase to 40 and 45% by echo ? 2-D echo 01/11/2017: EF 50-55%. GR 1 DD. Mild to moderate LA dilation.  Unstable Angina - Cath 03/27/2018: patent stent --> Cx-OM2 95% (C[place]) -- DES PCI. ~70% dRCA trifurcation (med Rx).  Terry Wright was last seen in Oct 2019 in Person & with Telemedicine visit in April 2020 --In April he was doing well.  No further anginal symptoms.  He did have some intermittent palpitations, that got better when he stopped cardiac rehab.  He indicates that this is probably due to him being nervous.  He also notes that the dyspnea symptom totally improved when he went up from Brilinta to Plavix. --> COVID-19 restrictions kept him from doing his gym workouts.  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  This is been quite a stressful situation with moving and then but not having the stable social setting.  Recent Hospitalizations:   None  Studies Personally Reviewed - (if available, images/films reviewed: From Epic Chart or Care Everywhere)  None  Interval History: "Terry Wright" returns here today for in person evaluation doing pretty well.  He is a little bit amped up because of his recent move and the stress of coming to the doctor's office.  He also did not take his blood pressure medications yet this morning.  He says that  usually his blood pressure is much better than it is today.  With all this stress from the social situations, he has had some more frequent anxiety episodes and no longer has refills for Ativan.  He is eagerly awaiting the gym is to open back up and to go back exercising, but he has been pretty active with walking etc.  He is adjusted his diet quite a bit and is pretty happy with the fact that he is lost some weight.  From a cardiac standpoint, he is pretty asymptomatic: No chest pain or shortness of breath with rest or exertion.  No PND, orthopnea or edema.  No palpitations (with the exception of when he is having an anxiety attack), lightheadedness, dizziness, weakness or syncope/near syncope. No TIA/amaurosis fugax symptoms. No claudication.  ROS: A comprehensive was performed. Review of Systems  Constitutional: Positive for weight loss (Intentional). Negative for malaise/fatigue.  HENT: Negative for nosebleeds.   Respiratory: Positive for cough (Off and on from allergies). Negative for shortness of breath.   Gastrointestinal: Positive for heartburn (If he eats the wrong thing). Negative for abdominal pain, blood in stool and melena.  Genitourinary: Negative for hematuria.  Musculoskeletal: Positive for joint pain (Normal arthritis pains).  Skin: Negative.   Neurological: Positive for dizziness (Sometimes if he stands up very fast) and headaches (Off-and-on). Negative for focal weakness.  Endo/Heme/Allergies: Bruises/bleeds easily.  Psychiatric/Behavioral: The patient is nervous/anxious.  Running out of PRN Ativan.  Is not on a baseline SSRI or SNRI.  All other systems reviewed and are negative.    I have reviewed and (if needed) personally updated the patient's problem list, medications, allergies, past medical and surgical history, social and family history.   Past Medical History:  Diagnosis Date  . CAD S/P percutaneous coronary angioplasty 07/2016, 02/2018   a) 100% pCx --  DES PCI with 4.0 x 16 mm Synergy DES. 65% ostial OM 3 (relatively small caliber - Med Rx);; b) 03/27/18 PCI/DES to dLCX/OM3 x1. Patent pCX DES. 700% dRCA trifurcation lesion (Med Rx). Normal EF  . DVT (deep venous thrombosis) (HCC)    Right upper arm DVT on 2 occasions in the remote past  . Ejection fraction    LV function normal, echo, February, 2010  . GERD (gastroesophageal reflux disease)   . Hyperlipidemia   . Hypertension   . Mitral regurgitation    Mild, echo, February, 2010  . Palpitations    Mild in the past  . Statin intolerance    Felt poorly after Lipitor, Crestor, TriCor  . STEMI involving left circumflex coronary artery ([place]) 08/04/2016   Occluded very large caliber codominant circumflex - PCI with single Synergy DES  . Tobacco abuse     Past Surgical History:  Procedure Laterality Date  . CARDIAC CATHETERIZATION N/A 08/04/2016   Procedure: Left Heart Cath and Coronary Angiography;  Surgeon: Leonie Man, MD;  Location: [place] [facility];  Service: Cardiovascular;  Laterality: N/A: 100% pCx --> PCI. residual 65% pOM3 (small).  ~EF 35-45%  with lateral HK.  Marland Kitchen CARDIAC CATHETERIZATION N/A 08/04/2016   Procedure: Coronary Stent Intervention;  Surgeon: Leonie Man, MD;  Location: [place] [facility];  Service: Cardiovascular;  Laterality: N/A: pCx 100%-0%: Synergy DES 4.0 x 16 (4.6 mm)  . CHONDROPLASTY Right 04/04/2017   Procedure: CHONDROPLASTY;  Surgeon: Dorna Leitz, MD;  Location: [facility];  Service: Orthopedics;  Laterality: Right;  . CORONARY STENT INTERVENTION N/A 03/27/2018   Procedure: CORONARY STENT INTERVENTION;  Surgeon: Martinique, Peter M, MD;  Location: [place] [facility];  Service: Cardiovascular:: dCx-OM3 95%-0%: DES PCI - Synergy 2.25 x 16 mm.  Marland Kitchen GANGLION CYST EXCISION     oct 2017  . HERNIA REPAIR     umbilical hernia 2017 oct  . KNEE ARTHROSCOPY Right 04/04/2017   Procedure: ARTHROSCOPY RIGHT KNEE, WITH MEIDAL FEMORAL CONDYLE PATELLAR MEDIAL FEMORAL JOINT PLICA  EXCISION ;  Surgeon: Dorna Leitz, MD;  Location: [facility];  Service: Orthopedics;  Laterality: Right;  . LEFT HEART CATH AND CORONARY ANGIOGRAPHY N/A 03/27/2018   Procedure: LEFT HEART CATH AND CORONARY ANGIOGRAPHY;  Surgeon: Martinique, Peter M, MD;  Location: [place] [facility];  Service: Cardiovascular:   2 vessel obstructive CAD: - 95% dLCx/OM3 --Successful PCI of the dLCx/OM3 with SYNERGY DES 2.25X16, -70% distal RCA at trifurcation (Med Management).  Widely patent pLCx DES. Low normal LV function (50-55%), Normal LVEDP   . TRANSTHORACIC ECHOCARDIOGRAM  08/05/2016   post STEMI: EF 40-45%. Mild concentric LVH. Severe HK of basal-mid inferolateral wall consistent with infarct in this distribution. GR 1 DD  . TRANSTHORACIC ECHOCARDIOGRAM  01/11/2017   EF 50-55%. GR 1 DD. Mild to moderate LA dilation.    Current Meds  Medication Sig  . acetaminophen (TYLENOL) 500 MG tablet Take 1,000 mg by mouth every 6 (six) hours as needed for headache (pain).  . carvedilol (COREG) 12.5 MG tablet Take 1 tablet (  12.5 mg total) by mouth 2 (two) times daily.  . clopidogrel (PLAVIX) 75 MG tablet TAKE 1 TABLET BY MOUTH  DAILY  . pantoprazole (PROTONIX) 40 MG tablet TAKE 1 TABLET BY MOUTH  DAILY  . rosuvastatin (CRESTOR) 20 MG tablet TAKE 1 TABLET BY MOUTH  DAILY  . [DISCONTINUED] aspirin EC 81 MG EC tablet Take 1 tablet (81 mg total) by mouth daily.  . [DISCONTINUED] LORazepam (ATIVAN) 0.5 MG tablet Take 0.5 mg by mouth 2 (two) times daily. Pt taking as needed  . [DISCONTINUED] LORazepam (ATIVAN) 0.5 MG tablet Take 1 tablet (0.5 mg total) by mouth 2 (two) times daily as needed for anxiety. #45 is supposed to be 3 month supply  . [DISCONTINUED] nitroGLYCERIN (NITROSTAT) 0.4 MG SL tablet Place 1 tablet (0.4 mg total) under the tongue every 5 (five) minutes as needed for chest pain.    No Known Allergies  Social History   Tobacco Use  . Smoking status: Former Smoker    Packs/day: 1.00    Years: 43.00    Pack  years: 43.00    Quit date: 2015    Years since quitting: 5.7  . Smokeless tobacco: Never Used  . Tobacco comment: Former smoker quit 2015  Substance Use Topics  . Alcohol use: Yes    Alcohol/week: 7.0 standard drinks    Types: 7 Glasses of wine per week    Comment: occasional  . Drug use: No   Social History   Social History Narrative   Married 10 years in 2018. 2 step kids- 32 and 31. 3 kids but he does not have contact with. No grandkids      Retired Theatre stage manager.    Wife working [place]      Hobbies: golf (needs shoulder replacement though, working in the yard, working out- was doing this regularly even before MI- walking/treadmill    family history includes Heart attack in his mother; Hypertension in his father; Other in his brother.  Wt Readings from Last 3 Encounters:  05/01/19 205 lb 9.6 oz (93.3 kg)  04/29/19 205 lb (93 kg)  11/07/18 215 lb (97.5 kg)    PHYSICAL EXAM BP (!) 143/91   Pulse 72   Temp 98.1 F (36.7 C)   Ht 6\' 1"  (1.854 m)   Wt 205 lb (93 kg)   SpO2 96%   BMI 27.05 kg/m  Physical Exam  Constitutional: He is oriented to person, place, and time. He appears well-developed and well-nourished.  Well-groomed.  Appears younger than his age.  HENT:  Head: Normocephalic and atraumatic.  Neck: Normal range of motion. Neck supple. No hepatojugular reflux and no JVD present. Carotid bruit is not present.  Cardiovascular: Normal rate, regular rhythm, normal heart sounds and intact distal pulses.  Occasional extrasystoles are present. PMI is not displaced. Exam reveals no gallop and no friction rub.  No murmur (Cannot exclude soft 1/6 SEM at RUSB) heard. Pulmonary/Chest: Effort normal and breath sounds normal. No respiratory distress. He has no wheezes. He has no rales.  Abdominal: Soft. Bowel sounds are normal. He exhibits no distension. There is no abdominal tenderness. There is no rebound.  Musculoskeletal: Normal range of motion.        General:  No edema.  Neurological: He is alert and oriented to person, place, and time.  Skin: Skin is warm and dry.  Psychiatric: He has a normal mood and affect. His behavior is normal. Judgment and thought content normal.  He seems to mask  the fact that he is quite anxious unless you start really talking to him  Vitals reviewed.    Adult ECG Report  Rate: 72;  Rhythm: normal sinus rhythm and Normal axis, intervals and durations.;   Narrative Interpretation: Normal/stable EKG   Other studies Reviewed: Additional studies/ records that were reviewed today include:  Recent Labs: He is due for labs now (not checked since 2019.  At that time LDL was 56 TG 99.  A1c 6.1.  Creatinine 0.89 and potassium 3.80.  Pretty normal labs.   ASSESSMENT / PLAN: Problem List Items Addressed This Visit    CAD S/P percutaneous coronary angioplasty - Primary (Chronic)    Inferior STEMI with VT arrest in January 2018 and then follow-up PCI for angina in August 2019..  1 year out now.  No active angina symptoms since his PCI in 2019.  Quite active.  Completed cardiac rehab.  No bleeding on Plavix but with notable improvement in breathing.  Plan: Discontinue aspirin and continue with maintenance dose Plavix (Okay to hold Plavix 5-7 days preop for procedures or surgeries)  Continue moderate dose of carvedilol and rosuvastatin.      Relevant Orders   EKG 12-Lead (Completed)   Lipid panel   Comprehensive metabolic panel   Lipid panel   Comprehensive metabolic panel   Dyslipidemia, goal LDL below 50 (Chronic)    Labs are pretty close to control back in August 2019.  Is due for follow-up labs now.  Reassess and adjust medications as planned. Continue current dose of rosuvastatin.  (Tolerating rosuvastatin more than atorvastatin.      Relevant Orders   Lipid panel   Comprehensive metabolic panel   Lipid panel   Comprehensive metabolic panel   Essential hypertension (Chronic)    Blood pressure is better today  on 12-1/2 twice daily carvedilol, however, he has not yet taken his blood pressure medicines today.  Continue to monitor.  This could be a situational hypertension prefer to see [place] noted and recheck.      Relevant Orders   EKG 12-Lead (Completed)   Lipid panel   Comprehensive metabolic panel   Lipid panel   Comprehensive metabolic panel   Ischemic cardiomyopathy (Chronic)    Initially reduced EF in the setting of MI.  Now pretty much resolved with most recent echo being 50 to 55% EF.  Now at this point I think is probably more heart failure with preserved EF.  Not necessarily requiring a diuretic at this point.  He is on beta-blocker and could theoretically be on ARB.      Relevant Orders   EKG 12-Lead (Completed)   Lipid panel   Comprehensive metabolic panel   Lipid panel   Comprehensive metabolic panel   Unstable angina (HCC)    His of the ligaments are quite different than just simple nondescript symptoms.  He pretty much does he is having symptoms similar to his MI.  Has not had any recurrent symptoms.  He is on a beta-blocker but not calcium channel blocker or Imdur as long as he not having active angina.          I spent a total of 67minutes with the patient and chart review. >  50% of the time was spent in direct patient consultation.   Current medicines are reviewed at length with the patient today.  (+/- concerns)  None The following changes have been made:  none   Patient Instructions  Medication Instructions:  NO CHANGES  If you need a refill on your cardiac medications before your next appointment, please call your pharmacy.   Lab work: LIPID CMP  If you have labs (blood work) drawn today and your tests are completely normal, you will receive your results only by: Marland Kitchen MyChart Message (if you have MyChart) OR . A paper copy in the mail If you have any lab test that is abnormal or we need to change your treatment, we will call you to review the results.   Testing/Procedures:  NOT NEEDED   Follow-Up: At [facility], you and your health needs are our priority.  As part of our continuing mission to provide you with exceptional heart care, we have created designated Provider Care Teams.  These Care Teams include your primary Cardiologist (physician) and Advanced Practice Providers (APPs -  Physician Assistants and Nurse Practitioners) who all work together to provide you with the care you need, when you need it. . You will need a follow up appointment in  6   [facility] 2021.  Please call our office 2 months in advance to schedule this appointment.  You may see [name], MD or one of the following Advanced Practice Providers on your designated Care Team:   . [name], PA-C . Jory Sims, DNP, ANP  Any Other Special Instructions Will Be Listed Below (If Applicable).    Studies Ordered:   Orders Placed This Encounter  Procedures  . Lipid panel  . Comprehensive metabolic panel  . Lipid panel  . Comprehensive metabolic panel  . EKG 12-Lead      [name], M.D., M.S. Interventional Cardiologist   Pager # [phone] Phone # [phone] [address] [place], [place] [address]   Thank you for choosing [facility] at [facility]!!

## 2019-04-30 NOTE — Telephone Encounter (Signed)
Called pt and pt states he is fine with coming in at 11:40, however, he states that is a long time to fast so he is not sure about getting the labs done tomorrow.

## 2019-05-01 ENCOUNTER — Ambulatory Visit: Payer: 59 | Admitting: Family Medicine

## 2019-05-01 ENCOUNTER — Encounter: Payer: Self-pay | Admitting: Family Medicine

## 2019-05-01 ENCOUNTER — Encounter: Payer: Self-pay | Admitting: Gastroenterology

## 2019-05-01 ENCOUNTER — Other Ambulatory Visit: Payer: Self-pay

## 2019-05-01 ENCOUNTER — Encounter: Payer: Self-pay | Admitting: Cardiology

## 2019-05-01 VITALS — BP 118/80 | HR 65 | Temp 98.7°F | Ht 73.0 in | Wt 205.6 lb

## 2019-05-01 DIAGNOSIS — Z1211 Encounter for screening for malignant neoplasm of colon: Secondary | ICD-10-CM | POA: Diagnosis not present

## 2019-05-01 DIAGNOSIS — I10 Essential (primary) hypertension: Secondary | ICD-10-CM | POA: Diagnosis not present

## 2019-05-01 DIAGNOSIS — E785 Hyperlipidemia, unspecified: Secondary | ICD-10-CM

## 2019-05-01 DIAGNOSIS — F410 Panic disorder [episodic paroxysmal anxiety] without agoraphobia: Secondary | ICD-10-CM | POA: Diagnosis not present

## 2019-05-01 DIAGNOSIS — Z9861 Coronary angioplasty status: Secondary | ICD-10-CM

## 2019-05-01 DIAGNOSIS — Z125 Encounter for screening for malignant neoplasm of prostate: Secondary | ICD-10-CM

## 2019-05-01 DIAGNOSIS — R739 Hyperglycemia, unspecified: Secondary | ICD-10-CM

## 2019-05-01 DIAGNOSIS — I2510 Atherosclerotic heart disease of native coronary artery without angina pectoris: Secondary | ICD-10-CM

## 2019-05-01 MED ORDER — NITROGLYCERIN 0.4 MG SL SUBL: 0.4000 mg | SUBLINGUAL_TABLET | SUBLINGUAL | 1 refills | Status: DC | PRN

## 2019-05-01 MED ORDER — LORAZEPAM 0.5 MG PO TABS: 0.5000 mg | ORAL_TABLET | Freq: Two times a day (BID) | ORAL | 1 refills | Status: DC | PRN

## 2019-05-01 NOTE — Telephone Encounter (Signed)
Copied from Ava [id number]. Topic: General - Inquiry >> May 01, 2019 12:46 PM Virl Axe D wrote: Reason for CRM: Pt stated Dr. Yong Channel told him that there are results of a previous colonoscopy in his chart. He would like to know if those results can be sent to [facility]. Please advise.

## 2019-05-01 NOTE — Patient Instructions (Addendum)
Health Maintenance Due  Topic Date Due  . COLONOSCOPY - We will call you within two weeks about your referral to GI. If you do not hear within 3 weeks, give [place] a call.   09/06/2015   Schedule a lab visit at the check out desk within 2 weeks. Return for future fasting labs meaning nothing but water after midnight please. Ok to take your medications with water.

## 2019-05-01 NOTE — Assessment & Plan Note (Addendum)
Initially reduced EF in the setting of MI.  Now pretty much resolved with most recent echo being 50 to 55% EF.  Now at this point I think is probably more heart failure with preserved EF.  Not necessarily requiring a diuretic at this point.  He is on beta-blocker and could theoretically be on ARB.

## 2019-05-01 NOTE — Assessment & Plan Note (Signed)
Blood pressure is better today on 12-1/2 twice daily carvedilol, however, he has not yet taken his blood pressure medicines today.  Continue to monitor.  This could be a situational hypertension prefer to see [place] noted and recheck.

## 2019-05-01 NOTE — Assessment & Plan Note (Signed)
Labs are pretty close to control back in August 2019.  Is due for follow-up labs now.  Reassess and adjust medications as planned. Continue current dose of rosuvastatin.  (Tolerating rosuvastatin more than atorvastatin.

## 2019-05-01 NOTE — Assessment & Plan Note (Signed)
Inferior STEMI with VT arrest in January 2018 and then follow-up PCI for angina in August 2019..  1 year out now.  No active angina symptoms since his PCI in 2019.  Quite active.  Completed cardiac rehab.  No bleeding on Plavix but with notable improvement in breathing.  Plan: Discontinue aspirin and continue with maintenance dose Plavix (Okay to hold Plavix 5-7 days preop for procedures or surgeries)  Continue moderate dose of carvedilol and rosuvastatin.

## 2019-05-01 NOTE — Progress Notes (Signed)
Phone [phone]   Subjective:  Terry Wright is a 63 y.o. year old very pleasant male patient who presents for/with See problem oriented charting Chief Complaint  Patient presents with  . Follow-up  . Lorazepam    medication refill  . Panic Attack   ROS- No fever, chills, cough, new congestion, runny nose, new shortness of breath, fatigue, body aches, sore throat, headache, nausea, vomiting, diarrhea, or new loss of taste or smell. No known contacts with covid 19 or someone being tested for covid 19.   Past Medical History-  Patient Active Problem List   Diagnosis Date Noted  . CAD S/P percutaneous coronary angioplasty 09/21/2016    Priority: High  . Panic attacks 09/21/2016    Priority: High  . History of DVT (deep vein thrombosis)     Priority: High  . Former smoker     Priority: High  . Centrilobular emphysema ([place]) 09/04/2017    Priority: Medium  . Hyperglycemia 09/21/2016    Priority: Medium  . Dyslipidemia, goal LDL below 50     Priority: Medium  . Essential hypertension     Priority: Medium  . Mitral regurgitation     Priority: Low  . GERD (gastroesophageal reflux disease)     Priority: Low  . Ventricular bigeminy     Priority: Low  . Unstable angina ([place])   . Chest pain 03/25/2018  . Aortic atherosclerosis ([place]) 09/04/2017  . Acute medial meniscus tear of right knee 04/04/2017  . 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 Ischemic cardiomyopathy 10/12/2016  . STEMI involving left circumflex coronary artery ([place]) -complicated by cardiac arrest 08/04/2016    Medications- reviewed and updated Current Outpatient Medications  Medication Sig Dispense Refill  . acetaminophen (TYLENOL) 500 MG tablet Take 1,000 mg by mouth every 6 (six) hours as needed for headache (pain).    Marland Kitchen clopidogrel (PLAVIX) 75 MG tablet TAKE 1 TABLET BY MOUTH  DAILY 90 tablet 1  . LORazepam (ATIVAN) 0.5 MG tablet Take 1 tablet (0.5 mg total) by mouth 2 (two) times daily as  needed for anxiety (90 day supply intended). 90 tablet 1  . nitroGLYCERIN (NITROSTAT) 0.4 MG SL tablet Place 1 tablet (0.4 mg total) under the tongue every 5 (five) minutes as needed for chest pain. 25 tablet 1  . pantoprazole (PROTONIX) 40 MG tablet TAKE 1 TABLET BY MOUTH  DAILY 90 tablet 1  . rosuvastatin (CRESTOR) 20 MG tablet TAKE 1 TABLET BY MOUTH  DAILY 90 tablet 1  . carvedilol (COREG) 12.5 MG tablet Take 1 tablet (12.5 mg total) by mouth 2 (two) times daily. 180 tablet 3   No current facility-administered medications for this visit.      Objective:  BP 118/80   Pulse 65   Temp 98.7 F (37.1 C)   Ht 6\' 1"  (1.854 m)   Wt 205 lb 9.6 oz (93.3 kg)   SpO2 98%   BMI 27.13 kg/m  Gen: NAD, resting comfortably CV: RRR no murmurs rubs or gallops Lungs: CTAB no crackles, wheeze, rhonchi Abdomen: soft/nontender/nondistended/normal bowel sounds.  Ext: no edema Skin: warm, dry    Assessment and Plan   #Anxiety/panic attacks S:Pt is here to get a refill on Lorazepam 0.5mg , pt states he has had a few panic attacks and with everything that is going on and a recent move has his anxiety at an all time high.Was indoors for several months and that was hard on him. Then moved out to [place]- a lot  of stress with that. House not cleaned well when went in.   Recently has been using lorazepam before  Bed due to mind racing/cannot shut mind down. If uses lorazepam at beditme can get to sleep and get back to sleep even if wakes up to use restroom.  Has only had to use lorazepam twice a day once in the last few months A/P: Worsening control-agree to increase lorazepam to #90 for 90-day supply-did encourage him to try to skip days if possible with plan after covid-19 pandemic to trend back down on medication use.  If not able to trend back down on medication use we may need to consider SSRI  #hypertension S: controlled on carvedilol 12.5 mg twice a day- yesterday blood pressure was high when he  had his cardiology follow-up-he feels like he may have been nervous BP Readings from Last 3 Encounters:  05/01/19 118/80  04/29/19 (!) 143/91 patient reports anxiety at cardiology office  11/07/18 (!) 148/84  A/P:  Stable. Continue current medications.    #hyperlipidemia/CAD S: Lipids controlled on rosuvastatin 20 mg  CAD-asymptomatic.  Patient is compliant with his Plavix and regular cardiology follow-up.  He does need a refill on nitroglycerin Lab Results  Component Value Date   CHOL 106 03/27/2018   HDL 30 (L) 03/27/2018   LDLCALC 56 03/27/2018   LDLDIRECT 159.1 06/05/2011   TRIG 99 03/27/2018   CHOLHDL 3.5 03/27/2018   A/P: CAD stable.  Lipids hopefully controlled-update lipid panel-we will need to forward results to Dr. Ellyn Hack -Dr. Ellyn Hack was okay with [place] moving forward colonoscopy and this was ordered today.  Last colonoscopy was actually 2012 with Dr. Lorayne Bender prefers to see [facility].  No adenomas noted last visit but history of adenomatous colon polyp per his last colonoscopy report so we will request at this time repeat colonoscopy-holding Plavix per Dr. Ellyn Hack  #Other labs-get PSA as last year ago and need to trend values- PSA was abnormal and may need to consider urology follow-up.  Also note hyperglycemia on problem list due to prior CBGs somewhat high-get A1c  Recommended follow up: I recommended 38-month follow-up Future Appointments  Date Time Provider Comfort  05/02/2019  9:30 AM [facility]  06/05/2019 11:00 AM Armbruster, Carlota Raspberry, MD [facility]    Lab/Order associations:   ICD-10-CM   1. Screening for colon cancer  Z12.11 Ambulatory referral to Gastroenterology  2. Panic attacks  F41.0   3. Essential hypertension  I10 POCT Urinalysis Dipstick (Automated)  4. Hyperglycemia  R73.9 Hemoglobin A1c  5. Dyslipidemia, goal LDL below 50  E78.5 CBC    Comprehensive metabolic panel    Lipid panel    POCT Urinalysis Dipstick  (Automated)  6. CAD S/P percutaneous coronary angioplasty  I25.10    Z98.61   7. Screening for prostate cancer  Z12.5 PSA   Meds ordered this encounter  Medications  . LORazepam (ATIVAN) 0.5 MG tablet    Sig: Take 1 tablet (0.5 mg total) by mouth 2 (two) times daily as needed for anxiety (90 day supply intended).    Dispense:  90 tablet    Refill:  1  . nitroGLYCERIN (NITROSTAT) 0.4 MG SL tablet    Sig: Place 1 tablet (0.4 mg total) under the tongue every 5 (five) minutes as needed for chest pain.    Dispense:  25 tablet    Refill:  1    Return precautions advised.  Garret Reddish, MD

## 2019-05-01 NOTE — Assessment & Plan Note (Signed)
His of the ligaments are quite different than just simple nondescript symptoms.  He pretty much does he is having symptoms similar to his MI.  Has not had any recurrent symptoms.  He is on a beta-blocker but not calcium channel blocker or Imdur as long as he not having active angina.

## 2019-05-02 ENCOUNTER — Other Ambulatory Visit (INDEPENDENT_AMBULATORY_CARE_PROVIDER_SITE_OTHER): Payer: 59

## 2019-05-02 DIAGNOSIS — Z125 Encounter for screening for malignant neoplasm of prostate: Secondary | ICD-10-CM

## 2019-05-02 DIAGNOSIS — E785 Hyperlipidemia, unspecified: Secondary | ICD-10-CM | POA: Diagnosis not present

## 2019-05-02 DIAGNOSIS — R739 Hyperglycemia, unspecified: Secondary | ICD-10-CM

## 2019-05-02 DIAGNOSIS — I10 Essential (primary) hypertension: Secondary | ICD-10-CM

## 2019-05-02 LAB — COMPREHENSIVE METABOLIC PANEL
ALT: 15 U/L (ref 0–53)
AST: 16 U/L (ref 0–37)
Albumin: 4.3 g/dL (ref 3.5–5.2)
Alkaline Phosphatase: 52 U/L (ref 39–117)
BUN: 11 mg/dL (ref 6–23)
CO2: 27 mEq/L (ref 19–32)
Calcium: 9.5 mg/dL (ref 8.4–10.5)
Chloride: 104 mEq/L (ref 96–112)
Creatinine, Ser: 0.81 mg/dL (ref 0.40–1.50)
GFR: 95.88 mL/min (ref >60.00)
Glucose, Bld: 117 mg/dL — ABNORMAL HIGH (ref 70–99)
Potassium: 5 mEq/L (ref 3.5–5.1)
Sodium: 141 mEq/L (ref 135–145)
Total Bilirubin: 0.9 mg/dL (ref 0.2–1.2)
Total Protein: 6.7 g/dL (ref 6.0–8.3)

## 2019-05-02 LAB — PSA: PSA: 2.78 ng/mL (ref 0.10–4.00)

## 2019-05-02 LAB — HEMOGLOBIN A1C: Hgb A1c MFr Bld: 6.5 % (ref 4.6–6.5)

## 2019-05-02 LAB — POC URINALSYSI DIPSTICK (AUTOMATED)
Bilirubin, UA: NEGATIVE
Blood, UA: NEGATIVE
Glucose, UA: NEGATIVE
Ketones, UA: NEGATIVE
Leukocytes, UA: NEGATIVE
Nitrite, UA: NEGATIVE
Protein, UA: NEGATIVE
Spec Grav, UA: 1.02 (ref 1.010–1.025)
Urobilinogen, UA: 0.2 E.U./dL
pH, UA: 6 (ref 5.0–8.0)

## 2019-05-02 LAB — LIPID PANEL
Cholesterol: 133 mg/dL (ref 0–200)
HDL: 42.4 mg/dL (ref >39.00)
LDL Cholesterol: 63 mg/dL (ref 0–99)
NonHDL: 90.91
Total CHOL/HDL Ratio: 3
Triglycerides: 141 mg/dL (ref 0.0–149.0)
VLDL: 28.2 mg/dL (ref 0.0–40.0)

## 2019-05-02 LAB — CBC
HCT: 46.1 % (ref 39.0–52.0)
Hemoglobin: 15.4 g/dL (ref 13.0–17.0)
MCHC: 33.3 g/dL (ref 30.0–36.0)
MCV: 96.7 fl (ref 78.0–100.0)
Platelets: 185 10*3/uL (ref 150.0–400.0)
RBC: 4.77 Mil/uL (ref 4.22–5.81)
RDW: 13.4 % (ref 11.5–15.5)
WBC: 7.2 10*3/uL (ref 4.0–10.5)

## 2019-05-11 ENCOUNTER — Other Ambulatory Visit: Payer: Self-pay | Admitting: Cardiology

## 2019-06-03 ENCOUNTER — Telehealth: Payer: Self-pay | Admitting: Acute Care

## 2019-06-05 ENCOUNTER — Encounter: Payer: Self-pay | Admitting: Gastroenterology

## 2019-06-05 ENCOUNTER — Ambulatory Visit: Payer: 59 | Admitting: Gastroenterology

## 2019-06-05 VITALS — BP 116/80 | HR 61 | Temp 98.7°F | Ht 73.0 in | Wt 204.0 lb

## 2019-06-05 DIAGNOSIS — Z7902 Long term (current) use of antithrombotics/antiplatelets: Secondary | ICD-10-CM

## 2019-06-05 DIAGNOSIS — Z86010 Personal history of colonic polyps: Secondary | ICD-10-CM

## 2019-06-05 DIAGNOSIS — K219 Gastro-esophageal reflux disease without esophagitis: Secondary | ICD-10-CM

## 2019-06-05 MED ORDER — NA SULFATE-K SULFATE-MG SULF 17.5-3.13-1.6 GM/177ML PO SOLN: 1.0000 | Freq: Once | ORAL | 0 refills | Status: AC

## 2019-06-05 NOTE — Telephone Encounter (Signed)
LVM for patient to return call to schedule this CT. I left my phone # [phone]

## 2019-06-05 NOTE — Telephone Encounter (Signed)
I just spoke with the patient and we have his LCS CT scheduled on 06/23/2019 @ 2:30pm. He is aware of the appt and location

## 2019-06-05 NOTE — Progress Notes (Signed)
HPI :  63 year old male with a history of colon polyps, history of ST elevation MI associated with V. tach arrest in 2018, history of cardiac stent placement on Plavix, referred here by Garret Reddish, MD for history of colon polyps.  As above, 2018 he had an ST elevation MI, required CPR and resuscitation, had a stent placed, had unstable angina with another stent placed in August 2019.  His EF was 50 to 55% at that time.  He has been on Plavix since then and doing well.  He denies any cardiopulmonary symptoms at this time.  His last colonoscopy was in 2012 with Dr. Earlean Shawl.  If the exam was normal however he was told to repeat in 5 years given he is had a history of multiple adenomas in the past.  He denies any history of colon cancer in the family.  He states his bowels are regular, denies any problems with them.  No blood in his stools.  No abdominal pains.  He does have occasional rectal spasm at times which is unpredictable and rare.  He generally eats well without any nausea or vomiting.  He does take Protonix 40 mg once a day for history of reflux.  He denies any dysphagia.  He had an EGD in 2012 which showed a hiatal hernia and no evidence of Barrett's esophagus.  He has no anemia.  He generally feels well without complaints.  He has seen his cardiologist Dr. Ellyn Hack within the past 2 months who has cleared him for holding Plavix for a colonoscopy.  Hgb 15.4, plt 195, MCV 96  Colonoscopy 09/05/2010 - Dr. Earlean Shawl - normal, told to repeat in 5 years given history of multiple adenomas EGD 09/05/2010 - moderate hiatal hernia, no Barrett's   Cardiac cath 03/27/18 - EF 50-55%  Past Medical History:  Diagnosis Date  . CAD S/P percutaneous coronary angioplasty 07/2016, 02/2018   a) 100% pCx -- DES PCI with 4.0 x 16 mm Synergy DES. 65% ostial OM 3 (relatively small caliber - Med Rx);; b) 03/27/18 PCI/DES to dLCX/OM3 x1. Patent pCX DES. 700% dRCA trifurcation lesion (Med Rx). Normal EF  . DVT (deep  venous thrombosis) ([facility])    Right upper arm DVT on 2 occasions in the remote past  . Ejection fraction    LV function normal, echo, February, 2010  . GERD (gastroesophageal reflux disease)   . Hyperlipidemia   . Hypertension   . Mitral regurgitation    Mild, echo, February, 2010  . Palpitations    Mild in the past  . Statin intolerance    Felt poorly after Lipitor, Crestor, TriCor  . STEMI involving left circumflex coronary artery ([place]) 08/04/2016   Occluded very large caliber codominant circumflex - PCI with single Synergy DES  . Tobacco abuse      Past Surgical History:  Procedure Laterality Date  . CARDIAC CATHETERIZATION N/A 08/04/2016   Procedure: Left Heart Cath and Coronary Angiography;  Surgeon: Leonie Man, MD;  Location: [facility];  Service: Cardiovascular;  Laterality: N/A: 100% pCx --> PCI. residual 65% pOM3 (small).  ~EF 35-45%  with lateral HK.  Marland Kitchen CARDIAC CATHETERIZATION N/A 08/04/2016   Procedure: Coronary Stent Intervention;  Surgeon: Leonie Man, MD;  Location: [facility];  Service: Cardiovascular;  Laterality: N/A: pCx 100%-0%: Synergy DES 4.0 x 16 (4.6 mm)  . CHONDROPLASTY Right 04/04/2017   Procedure: CHONDROPLASTY;  Surgeon: Dorna Leitz, MD;  Location: [facility];  Service: Orthopedics;  Laterality:  Right;  Marland Kitchen CORONARY STENT INTERVENTION N/A 03/27/2018   Procedure: CORONARY STENT INTERVENTION;  Surgeon: Martinique, Peter M, MD;  Location: [facility];  Service: Cardiovascular:: dCx-OM3 95%-0%: DES PCI - Synergy 2.25 x 16 mm.  Marland Kitchen GANGLION CYST EXCISION     oct 2017  . HERNIA REPAIR     umbilical hernia 2017 oct  . KNEE ARTHROSCOPY Right 04/04/2017   Procedure: ARTHROSCOPY RIGHT KNEE, WITH MEIDAL FEMORAL CONDYLE PATELLAR MEDIAL FEMORAL JOINT PLICA EXCISION ;  Surgeon: Dorna Leitz, MD;  Location: [facility];  Service: Orthopedics;  Laterality: Right;  . LEFT HEART CATH AND CORONARY ANGIOGRAPHY N/A 03/27/2018   Procedure: LEFT HEART CATH AND CORONARY  ANGIOGRAPHY;  Surgeon: Martinique, Peter M, MD;  Location: [facility];  Service: Cardiovascular:   2 vessel obstructive CAD: - 95% dLCx/OM3 --Successful PCI of the dLCx/OM3 with SYNERGY DES 2.25X16, -70% distal RCA at trifurcation (Med Management).  Widely patent pLCx DES. Low normal LV function (50-55%), Normal LVEDP   . TRANSTHORACIC ECHOCARDIOGRAM  08/05/2016   post STEMI: EF 40-45%. Mild concentric LVH. Severe HK of basal-mid inferolateral wall consistent with infarct in this distribution. GR 1 DD  . TRANSTHORACIC ECHOCARDIOGRAM  01/11/2017   EF 50-55%. GR 1 DD. Mild to moderate LA dilation.   Family History  Problem Relation Age of Onset  . Heart attack Mother        x2- lived into 50s  . Hypertension Father        lived into 72s  . Other Brother        killed in [place]   Social History   Tobacco Use  . Smoking status: Former Smoker    Packs/day: 1.00    Years: 43.00    Pack years: 43.00    Quit date: 2015    Years since quitting: 5.8  . Smokeless tobacco: Never Used  . Tobacco comment: Former smoker quit 2015  Substance Use Topics  . Alcohol use: Yes    Alcohol/week: 7.0 standard drinks    Types: 7 Glasses of wine per week    Comment: occasional  . Drug use: No   Current Outpatient Medications  Medication Sig Dispense Refill  . acetaminophen (TYLENOL) 500 MG tablet Take 1,000 mg by mouth every 6 (six) hours as needed for headache (pain).    . carvedilol (COREG) 12.5 MG tablet Take 1 tablet (12.5 mg total) by mouth 2 (two) times daily. 180 tablet 3  . clopidogrel (PLAVIX) 75 MG tablet TAKE 1 TABLET BY MOUTH  DAILY 90 tablet 1  . LORazepam (ATIVAN) 0.5 MG tablet Take 1 tablet (0.5 mg total) by mouth 2 (two) times daily as needed for anxiety (90 day supply intended). 90 tablet 1  . nitroGLYCERIN (NITROSTAT) 0.4 MG SL tablet Place 1 tablet (0.4 mg total) under the tongue every 5 (five) minutes as needed for chest pain. 25 tablet 1  . pantoprazole (PROTONIX) 40 MG tablet  TAKE 1 TABLET BY MOUTH  DAILY 90 tablet 1  . rosuvastatin (CRESTOR) 20 MG tablet TAKE 1 TABLET BY MOUTH  DAILY 90 tablet 1   No current facility-administered medications for this visit.    No Known Allergies   Review of Systems: All systems reviewed and negative except where noted in HPI.   Lab Results  Component Value Date   WBC 7.2 05/02/2019   HGB 15.4 05/02/2019   HCT 46.1 05/02/2019   MCV 96.7 05/02/2019   PLT 185.0 05/02/2019  Lab Results  Component Value Date   CREATININE 0.81 05/02/2019   BUN 11 05/02/2019   NA 141 05/02/2019   K 5.0 05/02/2019   CL 104 05/02/2019   CO2 27 05/02/2019    Lab Results  Component Value Date   ALT 15 05/02/2019   AST 16 05/02/2019   ALKPHOS 52 05/02/2019   BILITOT 0.9 05/02/2019     Physical Exam: BP 116/80   Pulse 61   Temp 98.7 F (37.1 C)   Ht 6\' 1"  (1.854 m)   Wt 204 lb (92.5 kg)   BMI 26.91 kg/m  Constitutional: Pleasant,well-developed, male in no acute distress. HEENT: Normocephalic and atraumatic. Conjunctivae are normal. No scleral icterus. Neck supple.  Cardiovascular: Normal rate, regular rhythm.  Pulmonary/chest: Effort normal and breath sounds normal. No wheezing, rales or rhonchi. Abdominal: Soft, nondistended, nontender.  There are no masses palpable. No hepatomegaly. Extremities: no edema Lymphadenopathy: No cervical adenopathy noted. Neurological: Alert and oriented to person place and time. Skin: Skin is warm and dry. No rashes noted. Psychiatric: Normal mood and affect. Behavior is normal.   ASSESSMENT AND PLAN: 63 year old male here for new patient assessment of the following:  History of colon polyps / antiplatelet therapy - as above, significant cardiac history now status post DES and Plavix for over a year, he is cleared to hold Plavix for colonoscopy.  He has a history of multiple adenomas in the past, his last colonoscopy in 2012 did not show any polyps however told to follow-up again in 5  years.  I discussed colonoscopy with him, risks and benefits of the procedure and anesthesia.  He understands with holding Plavix there is a very small risk for stent thrombosis/MI.  He can take aspirin while he hold his Plavix to help reduce this risk even further.  Following discussion he want to proceed, further recommendations pending the results and his course.  GERD - no history of Barrett's esophagus, doing well on Protonix monotherapy which he has been on for a long time.  We will continue present regimen for now, renal function normal, use lowest dose needed to control symptoms.   Cellar, MD [facility]  CC: Marin Olp, MD

## 2019-06-05 NOTE — Patient Instructions (Signed)
If you are age 64 or older, your body mass index should be between 23-30. Your Body mass index is 26.91 kg/m. If this is out of the aforementioned range listed, please consider follow up with your Primary Care Provider.  If you are age 27 or younger, your body mass index should be between 19-25. Your Body mass index is 26.91 kg/m. If this is out of the aformentioned range listed, please consider follow up with your Primary Care Provider.   You have been scheduled for a colonoscopy. Please follow written instructions given to you at your visit today.  Please pick up your prep supplies at the pharmacy within the next 1-3 days. If you use inhalers (even only as needed), please bring them with you on the day of your procedure.  Please stop your Plavix 5 days prior to your colonoscopy. I recommend that you take an 81mg  aspirin while off of Plavix.  Thank you for entrusting me with your care and for choosing [facility], Dr. Smith Cellar

## 2019-06-05 NOTE — Telephone Encounter (Signed)
Terry Wright, it looks like pt is returning your call to schedule f/u CT.

## 2019-06-09 ENCOUNTER — Encounter: Payer: Self-pay | Admitting: Gastroenterology

## 2019-06-12 ENCOUNTER — Encounter: Payer: 59 | Admitting: Gastroenterology

## 2019-06-23 ENCOUNTER — Ambulatory Visit (INDEPENDENT_AMBULATORY_CARE_PROVIDER_SITE_OTHER)
Admission: RE | Admit: 2019-06-23 | Discharge: 2019-06-23 | Disposition: A | Discharge status: Home / Self Care | Payer: 59 | Source: Ambulatory Visit | Attending: Acute Care | Admitting: Acute Care

## 2019-06-23 ENCOUNTER — Other Ambulatory Visit: Payer: Self-pay

## 2019-06-23 DIAGNOSIS — Z122 Encounter for screening for malignant neoplasm of respiratory organs: Secondary | ICD-10-CM

## 2019-06-23 DIAGNOSIS — Z87891 Personal history of nicotine dependence: Secondary | ICD-10-CM | POA: Diagnosis not present

## 2019-07-03 ENCOUNTER — Other Ambulatory Visit: Payer: Self-pay | Admitting: *Deleted

## 2019-07-03 DIAGNOSIS — Z122 Encounter for screening for malignant neoplasm of respiratory organs: Secondary | ICD-10-CM

## 2019-07-03 DIAGNOSIS — Z87891 Personal history of nicotine dependence: Secondary | ICD-10-CM

## 2019-07-17 ENCOUNTER — Other Ambulatory Visit: Payer: Self-pay | Admitting: Cardiology

## 2019-07-17 NOTE — Telephone Encounter (Signed)
Rx request sent to pharmacy.  

## 2019-08-12 ENCOUNTER — Encounter (HOSPITAL_COMMUNITY): Payer: Self-pay | Admitting: Emergency Medicine

## 2019-08-12 ENCOUNTER — Other Ambulatory Visit: Payer: Self-pay

## 2019-08-12 ENCOUNTER — Emergency Department (HOSPITAL_COMMUNITY)
Admission: EM | Admit: 2019-08-12 | Discharge: 2019-08-13 | Discharge status: Against Medical Advice | Payer: 59 | Attending: Emergency Medicine | Admitting: Emergency Medicine

## 2019-08-12 ENCOUNTER — Emergency Department (HOSPITAL_COMMUNITY): Payer: 59

## 2019-08-12 DIAGNOSIS — Z5321 Procedure and treatment not carried out due to patient leaving prior to being seen by health care provider: Secondary | ICD-10-CM | POA: Insufficient documentation

## 2019-08-12 DIAGNOSIS — R0789 Other chest pain: Secondary | ICD-10-CM | POA: Diagnosis present

## 2019-08-12 LAB — CBC
HCT: 45.3 % (ref 39.0–52.0)
Hemoglobin: 15 g/dL (ref 13.0–17.0)
MCH: 31.6 pg (ref 26.0–34.0)
MCHC: 33.1 g/dL (ref 30.0–36.0)
MCV: 95.6 fL (ref 80.0–100.0)
Platelets: 194 10*3/uL (ref 150–400)
RBC: 4.74 MIL/uL (ref 4.22–5.81)
RDW: 12.3 % (ref 11.5–15.5)
WBC: 8.5 10*3/uL (ref 4.0–10.5)
nRBC: 0 % (ref 0.0–0.2)

## 2019-08-12 LAB — PROTIME-INR
INR: 1.1 (ref 0.8–1.2)
Prothrombin Time: 13.8 seconds (ref 11.4–15.2)

## 2019-08-12 MED ORDER — SODIUM CHLORIDE 0.9% FLUSH: 3.0000 mL | Freq: Once | INTRAVENOUS | Status: DC

## 2019-08-12 NOTE — ED Triage Notes (Signed)
Patient reports intermittent mid sternal chest pain onset this week , denies emesis or SOB , history of CAD/coronary stents , his cardiologist is Dr. Ellyn Hack.

## 2019-08-13 ENCOUNTER — Encounter: Payer: Self-pay | Admitting: General Practice

## 2019-08-13 ENCOUNTER — Telehealth: Payer: Self-pay | Admitting: Cardiology

## 2019-08-13 ENCOUNTER — Ambulatory Visit: Payer: 59 | Admitting: General Practice

## 2019-08-13 VITALS — BP 122/88 | HR 61 | Ht 73.0 in | Wt 204.6 lb

## 2019-08-13 DIAGNOSIS — I200 Unstable angina: Secondary | ICD-10-CM

## 2019-08-13 DIAGNOSIS — E785 Hyperlipidemia, unspecified: Secondary | ICD-10-CM

## 2019-08-13 DIAGNOSIS — I2510 Atherosclerotic heart disease of native coronary artery without angina pectoris: Secondary | ICD-10-CM

## 2019-08-13 DIAGNOSIS — Z9861 Coronary angioplasty status: Secondary | ICD-10-CM

## 2019-08-13 DIAGNOSIS — I10 Essential (primary) hypertension: Secondary | ICD-10-CM | POA: Diagnosis not present

## 2019-08-13 LAB — BASIC METABOLIC PANEL
Anion gap: 10 (ref 5–15)
BUN: 18 mg/dL (ref 8–23)
CO2: 24 mmol/L (ref 22–32)
Calcium: 8.9 mg/dL (ref 8.9–10.3)
Chloride: 103 mmol/L (ref 98–111)
Creatinine, Ser: 1.06 mg/dL (ref 0.61–1.24)
GFR calc Af Amer: >60 mL/min (ref >60)
GFR calc non Af Amer: >60 mL/min (ref >60)
Glucose, Bld: 110 mg/dL — ABNORMAL HIGH (ref 70–99)
Potassium: 4 mmol/L (ref 3.5–5.1)
Sodium: 137 mmol/L (ref 135–145)

## 2019-08-13 LAB — TROPONIN I (HIGH SENSITIVITY)
Troponin I (High Sensitivity): 8 ng/L (ref <18)
Troponin I (High Sensitivity): 7 ng/L (ref <18)

## 2019-08-13 MED ORDER — AMLODIPINE BESYLATE 2.5 MG PO TABS: 2.5000 mg | ORAL_TABLET | Freq: Every day | ORAL | 3 refills | Status: DC

## 2019-08-13 NOTE — Telephone Encounter (Signed)
Pt c/o of Chest Pain: 1. Are you having CP right now? No  2. Are you experiencing any other symptoms (ex. SOB, nausea, vomiting, sweating)? Not right now 3. How long have you been experiencing CP? About 2 days 4. Is your CP continuous or coming and going? Coming and going 5. Have you taken Nitroglycerin? Last night before he went to the ER. Patient was there for 5 hours and stated that they didn't;t know when he would be seen.

## 2019-08-13 NOTE — Patient Instructions (Signed)
Medication Instructions:   START Amlodipine 2.5 mg daily.  *If you need a refill on your cardiac medications before your next appointment, please call your pharmacy*   Follow-Up: At [facility], you and your health needs are our priority.  As part of our continuing mission to provide you with exceptional heart care, we have created designated Provider Care Teams.  These Care Teams include your primary Cardiologist (physician) and Advanced Practice Providers (APPs -  Physician Assistants and Nurse Practitioners) who all work together to provide you with the care you need, when you need it.  Your next appointment:   Please keep your scheduled follow-up appointment with Dr. Ellyn Hack.

## 2019-08-13 NOTE — Telephone Encounter (Signed)
Pt of Dr. Ellyn Hack stated that he has been having off and on chest pressure for 2 days. Last night he took a Nitro and didn't have relief. Went to ED and waited 5 hours and stated they did not do anything. Pt left ED this morning and called office. Asked if he was currently having chest pain, pt denied. Stated that this is a pressure and it's dull. With his hx of MI and stent in 2019 - advised pt that if he has the Chest pressure again to take a Nitro and if that did not help that he could take up to 3 doses 5 minutes apart. Advised to call 911 or get someone to bring him to the hospital if chest pain does not subside. Will forward to Dr Ellyn Hack and his nurse for further review.

## 2019-08-13 NOTE — ED Notes (Signed)
Pt states he can't wait any longer and will be leaving. Pt seen exiting lobby.

## 2019-08-13 NOTE — Progress Notes (Signed)
Cardiology Clinic Note   Patient Name: Terry Wright Date of Encounter: 08/13/2019  Primary Care Provider:  Marin Olp, MD Primary Cardiologist:  [name], MD  Patient Profile    Terry Wright 64 year old male presents today for evaluation of his chest pain.  Past Medical History    Past Medical History:  Diagnosis Date  . CAD S/P percutaneous coronary angioplasty 07/2016, 02/2018   a) 100% pCx -- DES PCI with 4.0 x 16 mm Synergy DES. 65% ostial OM 3 (relatively small caliber - Med Rx);; b) 03/27/18 PCI/DES to dLCX/OM3 x1. Patent pCX DES. 700% dRCA trifurcation lesion (Med Rx). Normal EF  . DVT (deep venous thrombosis) (HCC)    Right upper arm DVT on 2 occasions in the remote past  . Ejection fraction    LV function normal, echo, February, 2010  . GERD (gastroesophageal reflux disease)   . Hyperlipidemia   . Hypertension   . Mitral regurgitation    Mild, echo, February, 2010  . Palpitations    Mild in the past  . Statin intolerance    Felt poorly after Lipitor, Crestor, TriCor  . STEMI involving left circumflex coronary artery ([place]) 08/04/2016   Occluded very large caliber codominant circumflex - PCI with single Synergy DES  . Tobacco abuse    Past Surgical History:  Procedure Laterality Date  . CARDIAC CATHETERIZATION N/A 08/04/2016   Procedure: Left Heart Cath and Coronary Angiography;  Surgeon: Leonie Man, MD;  Location: [facility];  Service: Cardiovascular;  Laterality: N/A: 100% pCx --> PCI. residual 65% pOM3 (small).  ~EF 35-45%  with lateral HK.  Marland Kitchen CARDIAC CATHETERIZATION N/A 08/04/2016   Procedure: Coronary Stent Intervention;  Surgeon: Leonie Man, MD;  Location: [facility];  Service: Cardiovascular;  Laterality: N/A: pCx 100%-0%: Synergy DES 4.0 x 16 (4.6 mm)  . CHONDROPLASTY Right 04/04/2017   Procedure: CHONDROPLASTY;  Surgeon: Dorna Leitz, MD;  Location: [facility];  Service: Orthopedics;  Laterality: Right;  .  CORONARY STENT INTERVENTION N/A 03/27/2018   Procedure: CORONARY STENT INTERVENTION;  Surgeon: Martinique, Peter M, MD;  Location: [facility];  Service: Cardiovascular:: dCx-OM3 95%-0%: DES PCI - Synergy 2.25 x 16 mm.  Marland Kitchen GANGLION CYST EXCISION     oct 2017  . HERNIA REPAIR     umbilical hernia 2017 oct  . KNEE ARTHROSCOPY Right 04/04/2017   Procedure: ARTHROSCOPY RIGHT KNEE, WITH MEIDAL FEMORAL CONDYLE PATELLAR MEDIAL FEMORAL JOINT PLICA EXCISION ;  Surgeon: Dorna Leitz, MD;  Location: [facility];  Service: Orthopedics;  Laterality: Right;  . LEFT HEART CATH AND CORONARY ANGIOGRAPHY N/A 03/27/2018   Procedure: LEFT HEART CATH AND CORONARY ANGIOGRAPHY;  Surgeon: Martinique, Peter M, MD;  Location: [facility];  Service: Cardiovascular:   2 vessel obstructive CAD: - 95% dLCx/OM3 --Successful PCI of the dLCx/OM3 with SYNERGY DES 2.25X16, -70% distal RCA at trifurcation ([facility]).  Widely patent pLCx DES. Low normal LV function (50-55%), Normal LVEDP   . TRANSTHORACIC ECHOCARDIOGRAM  08/05/2016   post STEMI: EF 40-45%. Mild concentric LVH. Severe HK of basal-mid inferolateral wall consistent with infarct in this distribution. GR 1 DD  . TRANSTHORACIC ECHOCARDIOGRAM  01/11/2017   EF 50-55%. GR 1 DD. Mild to moderate LA dilation.    Allergies  No Known Allergies  History of Present Illness    Mr. Terry Wright has a past medical history of coronary artery disease with PCI in the setting of inferior STEMI/VT  arrest 07/2016 and repeat PCI for angina in 02/2018.  He also has hypertension, history of ischemic cardiomyopathy, mitral regurgitation, GERD, emphysema, dyslipidemia, and panic attacks.  He was last seen by Dr. Ellyn Hack on 04/29/2019.  During that time he was doing well.  His aspirin was discontinued and his Plavix was continued.  He continued to be very active, his blood pressure was well controlled on 12.5 carvedilol twice daily, and denied chest pain.  He presented to the emergency  department on 08/12/2019.  EKG normal sinus rhythm first-degree AV block, high-sensitivity troponin 7 and 8, chest x-ray normal, BMP and CBC WDL.  He presents the cardiology clinic today for further evaluation and states he began to notice chest discomfort that he describes as intermittent ongoing 08/11/2019.  He drove himself to the emergency department waited in the parking lot and his chest discomfort subsided.  He drove home in the evening after eating he was going to bed and developed chest pain again.  He took 1 nitroglycerin but did not alleviate his symptoms.  He again presented to the emergency department where he received a negative ischemic eval.  He became worried due to his symptoms being similar to his previous MI.  He states he is compliant with all of his medications including his pantoprazole.  He continues to be physically active and walks 30 minutes briskly most days of the week.  This does not bring on chest discomfort or pain.  His blood pressure has been well controlled, he has a regular rate and rhythm, no increased stress, and has not changed his diet.  I will add amlodipine 2.5 daily and have him follow-up with Dr. Ellyn Hack with his previously scheduled appointment.  He currently denies chest pain, shortness of breath, lower extremity edema, fatigue, palpitations, melena, hematuria, hemoptysis, diaphoresis, weakness, presyncope, syncope, orthopnea, and PND.   Home Medications    Prior to Admission medications   Medication Sig Start Date End Date Taking? Authorizing Provider  acetaminophen (TYLENOL) 500 MG tablet Take 1,000 mg by mouth every 6 (six) hours as needed for headache (pain).    [provider]  carvedilol (COREG) 12.5 MG tablet Take 1 tablet (12.5 mg total) by mouth 2 (two) times daily. 11/06/18 06/05/19  Leonie Man, MD  clopidogrel (PLAVIX) 75 MG tablet TAKE 1 TABLET BY MOUTH  DAILY 07/17/19   Leonie Man, MD  LORazepam (ATIVAN) 0.5 MG tablet Take 1  tablet (0.5 mg total) by mouth 2 (two) times daily as needed for anxiety (90 day supply intended). 05/01/19   Marin Olp, MD  nitroGLYCERIN (NITROSTAT) 0.4 MG SL tablet Place 1 tablet (0.4 mg total) under the tongue every 5 (five) minutes as needed for chest pain. 05/01/19   Marin Olp, MD  pantoprazole (PROTONIX) 40 MG tablet TAKE 1 TABLET BY MOUTH  DAILY 05/13/19   Leonie Man, MD  rosuvastatin (CRESTOR) 20 MG tablet TAKE 1 TABLET BY MOUTH  DAILY 07/17/19   Leonie Man, MD    Family History    Family History  Problem Relation Age of Onset  . Heart attack Mother        x2- lived into 55s  . Hypertension Father        lived into 71s  . Other Brother        killed in [place]   He indicated that his mother is deceased. He indicated that his father is deceased. He indicated that his brother is deceased.  Social  History    Social History   Socioeconomic History  . Marital status: Married    Spouse name: [name]  . Number of children: Not on file  . Years of education: 94  . Highest education level: High school graduate  Occupational History  . Not on file  Tobacco Use  . Smoking status: Former Smoker    Packs/day: 1.00    Years: 43.00    Pack years: 43.00    Quit date: 2015    Years since quitting: 6.0  . Smokeless tobacco: Never Used  . Tobacco comment: Former smoker quit 2015  Substance and Sexual Activity  . Alcohol use: Yes    Alcohol/week: 7.0 standard drinks    Types: 7 Glasses of wine per week    Comment: occasional  . Drug use: No  . Sexual activity: Not on file  Other Topics Concern  . Not on file  Social History Narrative   Married 10 years in 2018. 2 step kids- 33 and 18. 3 kids but he does not have contact with. No grandkids      Retired Theatre stage manager.    Wife working [employer]      Hobbies: golf (needs shoulder replacement though, working in the yard, working out- was doing this regularly even before MI- walking/treadmill    Social Determinants of Radio broadcast assistant Strain:   . Difficulty of Paying Living Expenses: Not on file  Food Insecurity:   . Worried About Charity fundraiser in the Last Year: Not on file  . Ran Out of Food in the Last Year: Not on file  Transportation Needs:   . Lack of Transportation (Medical): Not on file  . Lack of Transportation (Non-Medical): Not on file  Physical Activity:   . Days of Exercise per Week: Not on file  . Minutes of Exercise per Session: Not on file  Stress:   . Feeling of Stress : Not on file  Social Connections:   . Frequency of Communication with Friends and Family: Not on file  . Frequency of Social Gatherings with Friends and Family: Not on file  . Attends Religious Services: Not on file  . Active Member of Clubs or Organizations: Not on file  . Attends Archivist Meetings: Not on file  . Marital Status: Not on file  Intimate Partner Violence:   . Fear of Current or Ex-Partner: Not on file  . Emotionally Abused: Not on file  . Physically Abused: Not on file  . Sexually Abused: Not on file     Review of Systems    General:  No chills, fever, night sweats or weight changes.  Cardiovascular:  No chest pain, dyspnea on exertion, edema, orthopnea, palpitations, paroxysmal nocturnal dyspnea. Dermatological: No rash, lesions/masses Respiratory: No cough, dyspnea Urologic: No hematuria, dysuria Abdominal:   No nausea, vomiting, diarrhea, bright red blood per rectum, melena, or hematemesis Neurologic:  No visual changes, wkns, changes in mental status. All other systems reviewed and are otherwise negative except as noted above.  Physical Exam    VS:  BP 122/88   Pulse 61   Ht 6\' 1"  (1.854 m)   Wt 204 lb 9.6 oz (92.8 kg)   BMI 26.99 kg/m  , BMI Body mass index is 26.99 kg/m. GEN: Well nourished, well developed, in no acute distress. HEENT: normal. Neck: Supple, no JVD, carotid bruits, or masses. Cardiac: RRR, no murmurs,  rubs, or gallops. No clubbing, cyanosis, edema.  Radials/DP/PT 2+  and equal bilaterally.  Respiratory:  Respirations regular and unlabored, clear to auscultation bilaterally. GI: Soft, nontender, nondistended, BS + x 4. MS: no deformity or atrophy. Skin: warm and dry, no rash. Neuro:  Strength and sensation are intact. Psych: Normal affect.  Accessory Clinical Findings    ECG personally reviewed by me today-sinus rhythm first-degree AV block 61 bpm- No acute changes  EKG 08/12/2019 Sinus rhythm first-degree AV block 65 bpm  Echocardiogram 01/11/2017 Study Conclusions  - Left ventricle: Mild lateral hypokinesis. The cavity size was   normal. Wall thickness was normal. Systolic function was normal.   The estimated ejection fraction was in the range of 50% to 55%.   Doppler parameters are consistent with abnormal left ventricular   relaxation (grade 1 diastolic dysfunction). - Left atrium: The atrium was mildly to moderately dilated. - Right atrium: The atrium was mildly dilated.  Cardiac catheterization 03/27/2018   Ost LAD to Prox LAD lesion is 20% stenosed.  Previously placed Prox Cx drug eluting stent is widely patent.  Ost 3rd Mrg lesion is 95% stenosed.  A drug-eluting stent was successfully placed using a STENT SYNERGY DES 2.25X16.  Post intervention, there is a 0% residual stenosis.  Dist RCA-1 lesion is 20% stenosed.  Dist RCA-2 lesion is 70% stenosed.  There is mild left ventricular systolic dysfunction.  LV end diastolic pressure is normal.  The left ventricular ejection fraction is 50-55% by visual estimate.   1. 2 vessel obstructive CAD     - 95% distal LCx/OM3     -70% distal RCA at trifurcation 2. Widely patent stent in the proximal LCx 3. Low normal LV function 4. Normal LVEDP 5. Successful PCI of the distal LCx/OM3 with DES  Anticipate DC in am.  Recommend uninterrupted dual antiplatelet therapy with Aspirin 81mg  daily and Clopidogrel 75mg   daily for a minimum of 12 months (ACS - Class I recommendation).  Diagnostic Dominance: Right  Intervention    Assessment & Plan   1.  Chest discomfort-EKG shows sinus rhythm first-degree AV block 61 bpm.Presented to the emergency department 08/12/2019 his high-sensitivity troponins were flat 7 and 8.  EKG showed sinus rhythm with first-degree AV block 65 bpm, chest x-ray negative, BMP and CBC WDL. Continue carvedilol 75 mg daily Continue nitroglycerin 0.4 mg sublingual as needed Continue rosuvastatin 20 mg tablet daily Heart healthy low-sodium diet Maintain physical activity as tolerated Start amlodipine 2.5 daily  Ischemic cardiomyopathy-echocardiogram 01/11/2017 showed EF of 50 to 55%.  No increased dyspnea on exertion Continue carvedilol 12.5 mg twice daily Heart healthy low-sodium diet-salty 6 given  Essential hypertension-BP today 122/88 Continue carvedilol 12.5 mg twice daily Start amlodipine 2.5 mg daily Maintain physical activity Heart healthy low-sodium diet-salty 6 given  Hyperlipidemia-05/02/2019: Cholesterol 133; HDL 42.40; LDL Cholesterol 63; Triglycerides 141.0; VLDL 28.2 Continue rosuvastatin 20 mg tablet Heart healthy low-sodium high-fiber diet Increase physical activity as tolerated  GERD-no increased reflux symptoms, burning, or belching. Continue Protonix 40 mg tablet daily GERD diet Maintain healthy weight  Disposition: Follow-up with Dr. Ellyn Hack in as scheduled   Jossie Ng. Mert Dietrick NP-C    03/04/2019 11:58 AM    [address] Office [phone] Fax [phone]

## 2019-08-13 NOTE — Telephone Encounter (Signed)
Made appt to see Coletta Memos @ 10:15 for chest pain

## 2019-08-19 ENCOUNTER — Other Ambulatory Visit: Payer: Self-pay | Admitting: Family Medicine

## 2019-08-20 NOTE — Telephone Encounter (Signed)
Pt requesting refill on Lorazepam. Last OV 05/2019.

## 2019-08-29 ENCOUNTER — Other Ambulatory Visit: Payer: Self-pay | Admitting: Cardiology

## 2019-09-02 NOTE — Progress Notes (Signed)
Unclear whether patient understood instructions - there is no comment on metformin- please call patient to follow up

## 2019-09-05 ENCOUNTER — Telehealth: Payer: Self-pay

## 2019-09-05 NOTE — Telephone Encounter (Signed)
Pt returning phone call back

## 2019-09-08 NOTE — Telephone Encounter (Signed)
Patient returning call that he missed from this office. He's unsure of who called him

## 2019-09-09 ENCOUNTER — Encounter: Payer: Self-pay | Admitting: Family Medicine

## 2019-09-10 ENCOUNTER — Other Ambulatory Visit: Payer: Self-pay

## 2019-09-10 MED ORDER — METFORMIN HCL 500 MG PO TABS: 500.0000 mg | ORAL_TABLET | Freq: Every day | ORAL | 3 refills | Status: DC

## 2019-09-16 ENCOUNTER — Other Ambulatory Visit: Payer: Self-pay

## 2019-09-16 ENCOUNTER — Encounter: Payer: Self-pay | Admitting: Family Medicine

## 2019-09-16 ENCOUNTER — Ambulatory Visit (INDEPENDENT_AMBULATORY_CARE_PROVIDER_SITE_OTHER): Payer: 59 | Admitting: Family Medicine

## 2019-09-16 VITALS — BP 128/80 | HR 71 | Temp 97.4°F | Ht 73.0 in | Wt 205.8 lb

## 2019-09-16 DIAGNOSIS — Z9861 Coronary angioplasty status: Secondary | ICD-10-CM

## 2019-09-16 DIAGNOSIS — R739 Hyperglycemia, unspecified: Secondary | ICD-10-CM | POA: Diagnosis not present

## 2019-09-16 DIAGNOSIS — J432 Centrilobular emphysema: Secondary | ICD-10-CM

## 2019-09-16 DIAGNOSIS — I10 Essential (primary) hypertension: Secondary | ICD-10-CM | POA: Diagnosis not present

## 2019-09-16 DIAGNOSIS — I2510 Atherosclerotic heart disease of native coronary artery without angina pectoris: Secondary | ICD-10-CM | POA: Diagnosis not present

## 2019-09-16 DIAGNOSIS — E785 Hyperlipidemia, unspecified: Secondary | ICD-10-CM

## 2019-09-16 DIAGNOSIS — F410 Panic disorder [episodic paroxysmal anxiety] without agoraphobia: Secondary | ICD-10-CM

## 2019-09-16 LAB — POCT GLYCOSYLATED HEMOGLOBIN (HGB A1C): Hemoglobin A1C: 6 % — AB (ref 4.0–5.6)

## 2019-09-16 NOTE — Assessment & Plan Note (Signed)
S: Patient continues to suffer from significant anxiety related to COVID-19 pandemic and not sure what the future holds.  He is using lorazepam primarily for sleep and does this on an almost nightly basis.  He has many questions such as can he return to the gym.  He does not report recent panic attacks  A/P: Anxiety appears stable but I do believe there is room for significant progress.  Next visit may discuss counseling versus daily medication.  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

## 2019-09-16 NOTE — Assessment & Plan Note (Signed)
S: Exercise and diet-  Exercise limited weather but usually walks 8-10k steps a day and sometimes more. Eats at home and feels like eats a reasonably healthy diet- gets good mix of fruits/veggies/proteins. Dad had diabetes so increase risk.   Weight stable from last visit Lab Results  Component Value Date   HGBA1C 6.0 (A) 09/16/2019   HGBA1C 6.5 05/02/2019   A/P:  We had a long discussion today. With a1c 6.0 on POC machine suspect its closer to 6.5 with phlebotomy. He is already eating pretty well and remaining really active- not sure there is much more he can improve on lifestyle wise- we opted to try a half dose of metformin 250mg  before breakfast - if he tolerates that can continue until follow up visit. For now will continue label only as prediabetes- only diagnose as diabetes if has another 6.5 or above

## 2019-09-16 NOTE — Progress Notes (Signed)
Phone [phone] In person visit   Subjective:   Terry Wright is a 64 y.o. year old very pleasant male patient who presents for/with See problem oriented charting Chief Complaint  Patient presents with  . Follow-up  . discuss medication   This visit occurred during the SARS-CoV-2 public health emergency.  Safety protocols were in place, including screening questions prior to the visit, additional usage of staff PPE, and extensive cleaning of exam room while observing appropriate contact time as indicated for disinfecting solutions.   Past Medical History-  Patient Active Problem List   Diagnosis Date Noted  . CAD S/P percutaneous coronary angioplasty 09/21/2016    Priority: High  . Panic attacks 09/21/2016    Priority: High  . STEMI involving left circumflex coronary artery ([place]) -complicated by cardiac arrest 08/04/2016    Priority: High  . History of DVT (deep vein thrombosis)     Priority: High  . Former smoker     Priority: High  . Centrilobular emphysema ([place]) 09/04/2017    Priority: Medium  . Ischemic cardiomyopathy 10/12/2016    Priority: Medium  . Hyperglycemia 09/21/2016    Priority: Medium  . Dyslipidemia, goal LDL below 50     Priority: Medium  . Essential hypertension     Priority: Medium  . Unstable angina (HCC)     Priority: Low  . Aortic atherosclerosis ([place]) 09/04/2017    Priority: Low  . Acute medial meniscus tear of right knee 04/04/2017    Priority: Low  . Mitral regurgitation     Priority: Low  . GERD (gastroesophageal reflux disease)     Priority: Low  . Ventricular bigeminy     Priority: Low  . Chondromalacia, right knee 04/04/2017    Medications- reviewed and updated Current Outpatient Medications  Medication Sig Dispense Refill  . acetaminophen (TYLENOL) 500 MG tablet Take 1,000 mg by mouth every 6 (six) hours as needed for headache (pain).    Marland Kitchen amLODipine (NORVASC) 2.5 MG tablet Take 1 tablet (2.5 mg total) by mouth daily.  30 tablet 3  . carvedilol (COREG) 12.5 MG tablet TAKE 1 TABLET BY MOUTH  TWICE DAILY 180 tablet 1  . clopidogrel (PLAVIX) 75 MG tablet TAKE 1 TABLET BY MOUTH  DAILY 90 tablet 3  . LORazepam (ATIVAN) 0.5 MG tablet TAKE 1 TABLET BY MOUTH  TWICE DAILY AS NEEDED FOR  ANXIETY (THIS IS 3 MONTH  SUPPLY) 45 tablet 0  . nitroGLYCERIN (NITROSTAT) 0.4 MG SL tablet Place 1 tablet (0.4 mg total) under the tongue every 5 (five) minutes as needed for chest pain. 25 tablet 1  . pantoprazole (PROTONIX) 40 MG tablet TAKE 1 TABLET BY MOUTH  DAILY 90 tablet 1  . rosuvastatin (CRESTOR) 20 MG tablet TAKE 1 TABLET BY MOUTH  DAILY 90 tablet 3  . metFORMIN (GLUCOPHAGE) 500 MG tablet Take 1 tablet (500 mg total) by mouth daily with breakfast. (Patient not taking: Reported on 09/16/2019) 90 tablet 3   No current facility-administered medications for this visit.     Objective:  BP 128/80   Pulse 71   Temp (!) 97.4 F (36.3 C)   Ht 6\' 1"  (1.854 m)   Wt 205 lb 12.8 oz (93.4 kg)   SpO2 96%   BMI 27.15 kg/m  Gen: NAD, resting comfortably CV: RRR no murmurs rubs or gallops Lungs: CTAB no crackles, wheeze, rhonchi Ext: no edema Skin: warm, dry    Assessment and Plan   #Social update-history of a bad  divorce and wife turned kids against him.  He had not talked to his daughter for the last 8 years and fortunately was able to restart conversation with her recently-he is simply thrilled about this.  He found out about some of his grandkids.  He unfortunately still has 2 children that will not talk to him but he is certainly hoping this will happen 1 day   #Anxiety/panic attacks S: Patient continues to suffer from significant anxiety related to COVID-19 pandemic and not sure what the future holds.  He is using lorazepam primarily for sleep and does this on an almost nightly basis.  He has many questions such as can he return to the gym.  He does not report recent panic attacks A/P: Anxiety appears stable but I do believe  there is room for significant progress.  Next visit may discuss counseling versus daily medication.  I did encourage him 2 weeks after his second Covid vaccination to consider going back to the gym since that seem to be a good outlet for him in the past   # Hyperglycemia/insulin resistance/prediabetes S: Exercise and diet-  Exercise limited weather but usually walks 8-10k steps a day and sometimes more. Eats at home and feels like eats a reasonably healthy diet- gets good mix of fruits/veggies/proteins. Dad had diabetes so increase risk.   Weight stable from last visit Lab Results  Component Value Date   HGBA1C 6.0 (A) 09/16/2019   HGBA1C 6.5 05/02/2019   A/P:  We had a long discussion today. With a1c 6.0 on POC machine suspect its closer to 6.5 with phlebotomy. He is already eating pretty well and remaining really active- not sure there is much more he can improve on lifestyle wise- we opted to try a half dose of metformin 250mg  before breakfast - if he tolerates that can continue until follow up visit. For now will continue label only as prediabetes- only diagnose as diabetes if has another 6.5 or above   #hypertension S: compliant with carvedilol 6.25 mg twice daily, amlodipine 2.5 mg.  Blood pressure much improved after starting amlodipine with cardiology A/P: Excellent control today-continue current medications   #CAD/hyperlipidemia -follows with Dr. Ellyn Hack of [facility]ology #Aortic atherosclerosis S: Patient with history of stents March 27, 2018 and August 04, 2016.  Compliant with Plavix 75 mg..  Chest pain-free other than having use nitroglycerin once since last visit as she went to the emergency room but weight was prolonged and had not been seen at 5 hours and went home-fortunately able to see cardiology the next day  Lipids have been controlled with LDL under 70 on rosuvastatin 20 mg  A/P: CAD appears controlled-very sparing use of nitroglycerin.  Lipids have been controlled-will  be due again for lipid panel October 2021.  Appears Dr. Ellyn Hack would prefer to get LDL under 50-consider rosuvastatin 40 mg at follow-up  Aortic atherosclerosis also noted-continue statin and Plavix  #COPD/centrilobular emphysema-noted on CT lung cancer screening.  No shortness of breath.  Continue to monitor   Recommended follow up: 71-month follow-up for physical recommended-we will have team convert from office visit to physical at that time Future Appointments  Date Time Provider [place]  10/24/2019 11:20 AM Leonie Man, MD [facility]  03/15/2020  3:00 PM Marin Olp, MD [facility]    Lab/Order associations:   ICD-10-CM   1. Hyperglycemia  R73.9 POCT HgB A1C  2. Panic attacks  F41.0   3. Essential hypertension  I10  4. CAD S/P percutaneous coronary angioplasty  I25.10    Z98.61   5. Dyslipidemia, goal LDL below 50  E78.5   6. Centrilobular emphysema ([place])  J43.2    Return precautions advised.  Garret Reddish, MD

## 2019-09-16 NOTE — Patient Instructions (Addendum)
Health Maintenance Due  Topic Date Due  . COLONOSCOPY -cancelled due to covid and plans to do this at 64 09/06/2015   We had a long discussion today. With a1c 6.0 on POC machine suspect its closer to 6.5 with phlebotomy. He is already eating pretty well and remaining really active- not sure there is much more he can improve on lifestyle wise- we opted to try a half dose of metformin 250mg  before breakfast - if he tolerates that can continue until follow up visit. For now will continue label only as prediabetes- only diagnose as diabetes if has another 6.5 or above  No other changes today- blood pressure looks better

## 2019-10-09 ENCOUNTER — Encounter: Payer: Self-pay | Admitting: Family Medicine

## 2019-10-10 ENCOUNTER — Other Ambulatory Visit: Payer: Self-pay

## 2019-10-10 MED ORDER — LORAZEPAM 0.5 MG PO TABS: ORAL_TABLET | ORAL | 0 refills | Status: DC

## 2019-10-10 NOTE — Progress Notes (Signed)
I refill this-refill was a little early as we would like for this to stretch at least 3 months-encourage him to try to use a little more sparingly

## 2019-10-20 ENCOUNTER — Other Ambulatory Visit: Payer: Self-pay | Admitting: Cardiology

## 2019-10-24 ENCOUNTER — Ambulatory Visit: Payer: 59 | Admitting: Cardiology

## 2019-10-24 ENCOUNTER — Other Ambulatory Visit: Payer: Self-pay

## 2019-10-24 ENCOUNTER — Encounter: Payer: Self-pay | Admitting: Cardiology

## 2019-10-24 VITALS — BP 110/70 | HR 64 | Temp 97.0°F | Ht 73.0 in | Wt 205.0 lb

## 2019-10-24 DIAGNOSIS — I10 Essential (primary) hypertension: Secondary | ICD-10-CM | POA: Diagnosis not present

## 2019-10-24 DIAGNOSIS — E785 Hyperlipidemia, unspecified: Secondary | ICD-10-CM | POA: Diagnosis not present

## 2019-10-24 DIAGNOSIS — I255 Ischemic cardiomyopathy: Secondary | ICD-10-CM

## 2019-10-24 DIAGNOSIS — I2121 ST elevation (STEMI) myocardial infarction involving left circumflex coronary artery: Secondary | ICD-10-CM

## 2019-10-24 DIAGNOSIS — I2510 Atherosclerotic heart disease of native coronary artery without angina pectoris: Secondary | ICD-10-CM

## 2019-10-24 DIAGNOSIS — Z9861 Coronary angioplasty status: Secondary | ICD-10-CM

## 2019-10-24 NOTE — Patient Instructions (Signed)

## 2019-10-24 NOTE — Progress Notes (Signed)
Primary Care Provider: Marin Olp, MD Cardiologist: [name], MD Electrophysiologist: None  Clinic Note: Chief Complaint  Patient presents with  . Follow-up    Doing okay since last visit for chest pain  . Coronary Artery Disease    No more chest pain since January   HPI:    Terry Wright is a 64 y.o. male with a PMH notable for CAD-PCI (had STEMI complicated by cardiac arrest, then had additional PCI for unstable angina) below who presents today for what amounts to be 64-month follow-up, but had originally scheduled for 65-month follow-up for me..  Cardiac History:  Inferior-posterior STEMI/VT arrest 08/06/2016--> Shock x 2 for VT - ROSC -> CATH - 100% pCx - DES PCI (Synergy DES 4.0 x 16 - 4.6 mm) ? Initial EF by LV gram was 35-40% increase to 40 and 45% by echo ? 2-D echo 01/11/2017: EF 50-55%. GR 1 DD. Mild to moderate LA dilation.  Unstable Angina - Cath 03/27/2018: patent stent --> Cx-OM2 95% ([place]) -- DES PCI. ~70% dRCA trifurcation (med Rx).  Terry Wright was last seen here for in person evaluation on April 29, 2019. He was doing very well at that time, only noted that he was a little bit "amped up "because of his recent move to a different house. He was having some more anxiety episodes and PTSD type symptoms. (Date back to prior to his MI/cardiac arrest, but certainly got worse after that). He was eagerly awaiting to be able to go back to the gym. He works out at the [employer] which allows him some flexibility. -> Noted intentional weight loss, was half about that. Some mild positional dizziness and bruising.  No med changes made. Lipids and chemistry levels ordered.  Recent Hospitalizations:   ED visit August 12, 2019 --> after initial triage, was waiting for couple hours before being seen. By that time his chest pain resolved and he decided to go home. Troponin levels were nonrevealing. EKG looked  stable.  Terry Wright was seen the next morning on January 13 by Odie Sera, NP. He said starting on 11 January he had intermittent ongoing chest pain until the 12th. His chest pain subsided in the emergency room parking lot. He said that he did take at least 1 nitroglycerin prior to that it did not relieve his symptoms. His major concern was that this was similar to previous MI symptoms. However, he is continues to be physically active walking 30 minutes a day and working out at the gym with no chest pain or discomfort with exertion.  2.5 mg amlodipine added with plans to follow-up now.  Reviewed  CV studies:    The following studies were reviewed today: (if available, images/films reviewed: From Epic Chart or Care Everywhere) . N/A:   Interval History:   Terry Wright returns here today feeling better. He has not had any more that chest discomfort that he had back in January. He still does his exercise now and is back at the gym. He is also walking quite a bit of days. He will have some chest comfort every now and then but nothing like he had it in January. No PND orthopnea. What he does note is that he did not have chest pain with exertion, it was at rest back in January. He denies any heart failure symptoms or arrhythmia symptoms of palpitations. The only limiting feature for him doing his walking is hip pain.  CV Review  of Symptoms (Summary) Cardiovascular ROS: positive for - chest pain and Has not had any further since January. negative for - dyspnea on exertion, edema, irregular heartbeat, loss of consciousness, orthopnea, palpitations, paroxysmal nocturnal dyspnea, rapid heart rate, shortness of breath or Near syncope, TIA/amaurosis fugax, claudication  The patient does not have symptoms concerning for COVID-19 infection (fever, chills, cough, or new shortness of breath).  The patient is practicing social distancing & Masking.    REVIEWED OF SYSTEMS    Review of Systems  Constitutional: Negative for malaise/fatigue (Not as much get up and go was used to have, but no complaints) and weight loss (Had lost weight and is now maintaining the stable weight.).  HENT: Negative for congestion and nosebleeds.   Respiratory: Negative for cough and shortness of breath.   Gastrointestinal: Negative for blood in stool, heartburn and melena.  Genitourinary: Negative for hematuria.  Musculoskeletal: Positive for joint pain (Mostly hip pain). Negative for falls and myalgias.  Neurological: Positive for dizziness (Only on occasion with rapid standing). Negative for focal weakness and weakness.  Endo/Heme/Allergies: Bruises/bleeds easily (But better).  Psychiatric/Behavioral: Negative for depression (This seems stable). The patient is nervous/anxious (Still has intermittent episodes of anxiety and PTSD. This seems to have stabilized out since last visit. Never did start on SSRI or SNRI. Just using as needed Ativan.).    I have reviewed and (if needed) personally updated the patient's problem list, medications, allergies, past medical and surgical history, social and family history.   PAST MEDICAL HISTORY   Past Medical History:  Diagnosis Date  . CAD S/P percutaneous coronary angioplasty 07/2016, 02/2018   a) 100% pCx -- DES PCI with 4.0 x 16 mm Synergy DES. 65% ostial OM 3 (relatively small caliber - Med Rx);; b) 03/27/18 PCI/DES to dLCX/OM3 x1. Patent pCX DES. 700% dRCA trifurcation lesion (Med Rx). Normal EF  . DVT (deep venous thrombosis) (HCC)    Right upper arm DVT on 2 occasions in the remote past  . Ejection fraction    LV function normal, echo, February, 2010  . GERD (gastroesophageal reflux disease)   . Hyperlipidemia   . Hypertension   . Mitral regurgitation    Mild, echo, February, 2010  . Palpitations    Mild in the past  . Statin intolerance    Felt poorly after Lipitor, Crestor, TriCor  . STEMI involving left circumflex coronary artery  ([place]) 08/04/2016   Occluded very large caliber codominant circumflex - PCI with single Synergy DES  . Tobacco abuse     PAST SURGICAL HISTORY   Past Surgical History:  Procedure Laterality Date  . CARDIAC CATHETERIZATION N/A 08/04/2016   Procedure: Left Heart Cath and Coronary Angiography;  Surgeon: Leonie Man, MD;  Location: [facility];  Service: Cardiovascular;  Laterality: N/A: 100% pCx --> PCI. residual 65% pOM3 (small).  ~EF 35-45%  with lateral HK.  Marland Kitchen CARDIAC CATHETERIZATION N/A 08/04/2016   Procedure: CORONARY STENT INTERVENTION;  Surgeon: Leonie Man, MD;  Location: [facility];  Service: Cardiovascular;  Laterality: N/A: pCx 100%-0%: Synergy DES 4.0 x 16 (4.6 mm)  . CHONDROPLASTY Right 04/04/2017   Procedure: CHONDROPLASTY;  Surgeon: Dorna Leitz, MD;  Location: [facility];  Service: Orthopedics;  Laterality: Right;  . CORONARY STENT INTERVENTION N/A 03/27/2018   Procedure: CORONARY STENT INTERVENTION;  Surgeon: Martinique, Peter M, MD;  Location: [facility];  Service: Cardiovascular:: dCx-OM3 95%-0%: DES PCI - Synergy 2.25 x 16 mm.  Marland Kitchen GANGLION  CYST EXCISION     oct 2017  . HERNIA REPAIR     umbilical hernia 2017 oct  . KNEE ARTHROSCOPY Right 04/04/2017   Procedure: ARTHROSCOPY RIGHT KNEE, WITH MEIDAL FEMORAL CONDYLE PATELLAR MEDIAL FEMORAL JOINT PLICA EXCISION ;  Surgeon: Dorna Leitz, MD;  Location: [facility];  Service: Orthopedics;  Laterality: Right;  . LEFT HEART CATH AND CORONARY ANGIOGRAPHY N/A 03/27/2018   Procedure: LEFT HEART CATH AND CORONARY ANGIOGRAPHY;  Surgeon: Martinique, Peter M, MD;  Location: [facility];  Service: Cardiovascular:   2 vessel obstructive CAD: - 95% dLCx/OM3 --Successful PCI of the dLCx/OM3 with SYNERGY DES 2.25X16, -70% distal RCA at trifurcation (Med Management).  Widely patent pLCx DES. Low normal LV function (50-55%), Normal LVEDP   . TRANSTHORACIC ECHOCARDIOGRAM  08/05/2016   post STEMI: EF 40-45%. Mild concentric LVH. Severe HK  of basal-mid inferolateral wall consistent with infarct in this distribution. GR 1 DD  . TRANSTHORACIC ECHOCARDIOGRAM  01/11/2017   EF 50-55%. GR 1 DD. Mild to moderate LA dilation.    MEDICATIONS/ALLERGIES   Current Meds  Medication Sig  . acetaminophen (TYLENOL) 500 MG tablet Take 1,000 mg by mouth every 6 (six) hours as needed for headache (pain).  Marland Kitchen amLODipine (NORVASC) 2.5 MG tablet Take 1 tablet (2.5 mg total) by mouth daily.  . carvedilol (COREG) 12.5 MG tablet TAKE 1 TABLET BY MOUTH  TWICE DAILY  . clopidogrel (PLAVIX) 75 MG tablet TAKE 1 TABLET BY MOUTH  DAILY  . LORazepam (ATIVAN) 0.5 MG tablet TAKE 1 TABLET BY MOUTH  TWICE DAILY AS NEEDED FOR  ANXIETY (THIS IS 3 MONTH  SUPPLY)  . metFORMIN (GLUCOPHAGE) 500 MG tablet Take 250 mg by mouth daily with breakfast.  . nitroGLYCERIN (NITROSTAT) 0.4 MG SL tablet Place 1 tablet (0.4 mg total) under the tongue every 5 (five) minutes as needed for chest pain.  . pantoprazole (PROTONIX) 40 MG tablet TAKE 1 TABLET BY MOUTH  DAILY  . rosuvastatin (CRESTOR) 20 MG tablet TAKE 1 TABLET BY MOUTH  DAILY    No Known Allergies  SOCIAL HISTORY/FAMILY HISTORY   Reviewed in Epic:  Pertinent findings: -Has settled into his new work environment. Enjoys working out at the [employer] which gives him flexibility and is less crowded.  OBJCTIVE -PE, EKG, labs   Wt Readings from Last 3 Encounters:  10/24/19 205 lb (93 kg)  09/16/19 205 lb 12.8 oz (93.4 kg)  08/13/19 204 lb 9.6 oz (92.8 kg)    Physical Exam: BP 110/70   Pulse 64   Temp (!) 97 F (36.1 C)   Ht 6\' 1"  (1.854 m)   Wt 205 lb (93 kg)   SpO2 95%   BMI 27.05 kg/m  Physical Exam  Constitutional: He is oriented to person, place, and time. He appears well-developed and well-nourished. No distress.  Healthy-appearing. Well-groomed.  HENT:  Head: Normocephalic and atraumatic.  Neck: No hepatojugular reflux and no JVD present. Carotid bruit is not present.   Cardiovascular: Normal rate, regular rhythm, S1 normal, S2 normal and intact distal pulses.  No extrasystoles are present. PMI is not displaced. Exam reveals no gallop and no friction rub.  Murmur (Soft 1/6 SEM at RUSB) heard. Pulmonary/Chest: Breath sounds normal. No respiratory distress. He has no wheezes. He has no rales. He exhibits no tenderness.  Abdominal: Soft. Bowel sounds are normal. He exhibits no distension. There is no abdominal tenderness. There is no rebound.  Musculoskeletal:  General: No edema. Normal range of motion.     Cervical back: Normal range of motion and neck supple.  Neurological: He is alert and oriented to person, place, and time.  Psychiatric: He has a normal mood and affect. Judgment and thought content normal.  Vitals reviewed.   Adult ECG Report n/a  Recent Labs:    Lab Results  Component Value Date   CHOL 133 05/02/2019   HDL 42.40 05/02/2019   LDLCALC 63 05/02/2019   LDLDIRECT 159.1 06/05/2011   TRIG 141.0 05/02/2019   CHOLHDL 3 05/02/2019   Lab Results  Component Value Date   CREATININE 1.06 08/12/2019   BUN 18 08/12/2019   NA 137 08/12/2019   K 4.0 08/12/2019   CL 103 08/12/2019   CO2 24 08/12/2019   No results found for: TSH  ASSESSMENT/PLAN    Problem List Items Addressed This Visit    CAD S/P percutaneous coronary angioplasty - Primary (Chronic)    Initial stent was in the proximal LCx, with the second stent being downstream in the distal LCx-OM3 (not overlapping). About 18 months out from most recent PCI in 02/2018.  Plan:   On stable dose of carvedilol and statin.  Now on amlodipine for potential coronary spasm and antianginal benefit. Seems to doing very well.  Continue clopidogrel alone without aspirin (much improved bleeding).  Okay to hold clopidogrel/Plavix 5 to 7 days preop for surgeries or procedures.      STEMI involving left circumflex coronary artery ([place]) -complicated by cardiac arrest (Chronic)    3  years out from STEMI. Widely patent stent. EF notably improved on follow-up echoes.  Unremarkable recovering following his cardiac arrest. Major issue now is some psychological issues with it. He notably has PTSD from this episode.    Simply because of his level of concern, I would have low threshold to consider stress testing.      Dyslipidemia, goal LDL below 50 (Chronic)    Labs checked in October look great. LDL 63. Continue current dose of rosuvastatin. Tolerating well without myalgias.      Essential hypertension (Chronic)    Blood pressure stable on carvedilol. Tolerating amlodipine.      Ischemic cardiomyopathy (Chronic)    EF now improved to 50 to 55%. Clearly would have some diastolic dysfunction, but no active heart failure symptoms.         COVID-19 Education: The signs and symptoms of COVID-19 were discussed with the patient and how to seek care for testing (follow up with PCP or arrange E-visit).   The importance of social distancing was discussed today.  I spent a total of 33minutes with the patient. >  50% of the time was spent in direct patient consultation.  Additional time spent with chart review  / charting (studies, outside notes, etc): 10 Total Time: 34 min   Current medicines are reviewed at length with the patient today.  (+/- concerns) n/a  Notice: This dictation was prepared with Dragon dictation along with smaller phrase technology. Any transcriptional errors that result from this process are unintentional and may not be corrected upon review.  Patient Instructions / Medication Changes & Studies & Tests Ordered   Patient Instructions  Medication Instructions:  NO CHANGES  *If you need a refill on your cardiac medications before your next appointment, please call your pharmacy*   Lab Work: NOT NEEDED   Testing/Procedures: NOT NEEDED   Follow-Up: At [facility], you and your health needs are our priority.  As part of our continuing  mission to provide you with exceptional heart care, we have created designated Provider Care Teams.  These Care Teams include your primary Cardiologist (physician) and Advanced Practice Providers (APPs -  Physician Assistants and Nurse Practitioners) who all work together to provide you with the care you need, when you need it.    Your next appointment:   6 month(s)  The format for your next appointment:   In Person  Provider:   [name], MD     Studies Ordered:   No orders of the defined types were placed in this encounter.    [name], M.D., M.S. Interventional Cardiologist   Pager # [phone] Phone # [phone] [address] [place], [place] [address]   Thank you for choosing Heartcare at [facility]!!

## 2019-10-26 ENCOUNTER — Encounter: Payer: Self-pay | Admitting: Cardiology

## 2019-10-26 NOTE — Assessment & Plan Note (Signed)
Blood pressure stable on carvedilol. Tolerating amlodipine.

## 2019-10-26 NOTE — Assessment & Plan Note (Signed)
Labs checked in October look great. LDL 63. Continue current dose of rosuvastatin. Tolerating well without myalgias.

## 2019-10-26 NOTE — Assessment & Plan Note (Addendum)
Initial stent was in the proximal LCx, with the second stent being downstream in the distal LCx-OM3 (not overlapping). About 18 months out from most recent PCI in 02/2018.  Plan:   On stable dose of carvedilol and statin.  Now on amlodipine for potential coronary spasm and antianginal benefit. Seems to doing very well.  Continue clopidogrel alone without aspirin (much improved bleeding).  Okay to hold clopidogrel/Plavix 5 to 7 days preop for surgeries or procedures.

## 2019-10-26 NOTE — Assessment & Plan Note (Signed)
EF now improved to 50 to 55%. Clearly would have some diastolic dysfunction, but no active heart failure symptoms.

## 2019-10-26 NOTE — Assessment & Plan Note (Signed)
3 years out from STEMI. Widely patent stent. EF notably improved on follow-up echoes.  Unremarkable recovering following his cardiac arrest. Major issue now is some psychological issues with it. He notably has PTSD from this episode.    Simply because of his level of concern, I would have low threshold to consider stress testing.

## 2019-10-30 ENCOUNTER — Other Ambulatory Visit: Payer: Self-pay

## 2019-10-30 MED ORDER — AMLODIPINE BESYLATE 2.5 MG PO TABS: 2.5000 mg | ORAL_TABLET | Freq: Every day | ORAL | 1 refills | Status: DC

## 2019-11-19 ENCOUNTER — Encounter: Payer: Self-pay | Admitting: Family Medicine

## 2019-11-20 ENCOUNTER — Other Ambulatory Visit: Payer: Self-pay

## 2019-11-21 ENCOUNTER — Ambulatory Visit (INDEPENDENT_AMBULATORY_CARE_PROVIDER_SITE_OTHER): Payer: 59 | Admitting: Family Medicine

## 2019-11-21 ENCOUNTER — Encounter: Payer: Self-pay | Admitting: Family Medicine

## 2019-11-21 VITALS — BP 120/62 | HR 67 | Temp 98.0°F | Ht 73.0 in | Wt 206.0 lb

## 2019-11-21 DIAGNOSIS — I10 Essential (primary) hypertension: Secondary | ICD-10-CM | POA: Diagnosis not present

## 2019-11-21 DIAGNOSIS — M25551 Pain in right hip: Secondary | ICD-10-CM

## 2019-11-21 DIAGNOSIS — R739 Hyperglycemia, unspecified: Secondary | ICD-10-CM | POA: Diagnosis not present

## 2019-11-21 DIAGNOSIS — M25552 Pain in left hip: Secondary | ICD-10-CM

## 2019-11-21 DIAGNOSIS — T887XXA Unspecified adverse effect of drug or medicament, initial encounter: Secondary | ICD-10-CM

## 2019-11-21 DIAGNOSIS — G4700 Insomnia, unspecified: Secondary | ICD-10-CM | POA: Diagnosis not present

## 2019-11-21 DIAGNOSIS — Z9861 Coronary angioplasty status: Secondary | ICD-10-CM

## 2019-11-21 DIAGNOSIS — E785 Hyperlipidemia, unspecified: Secondary | ICD-10-CM

## 2019-11-21 DIAGNOSIS — Z23 Encounter for immunization: Secondary | ICD-10-CM

## 2019-11-21 DIAGNOSIS — I2510 Atherosclerotic heart disease of native coronary artery without angina pectoris: Secondary | ICD-10-CM

## 2019-11-21 MED ORDER — LORAZEPAM 0.5 MG PO TABS: 0.5000 mg | ORAL_TABLET | Freq: Every evening | ORAL | 1 refills | Status: DC | PRN

## 2019-11-21 MED ORDER — NITROGLYCERIN 0.4 MG SL SUBL: 0.4000 mg | SUBLINGUAL_TABLET | SUBLINGUAL | 1 refills | Status: DC | PRN

## 2019-11-21 NOTE — Assessment & Plan Note (Signed)
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 -COVID-19 pandemic is also significant stressor-doing better now that he has been able to get back to the gym but recent worsening of pain has also been a stressor -Ativan primarily for sleep-has racing thoughts before bed  A/P: Patient is only using Ativan for sleep at this point-I refilled medication for 90-day supply-previously was using in crowded spaces or flights but has not had these recently

## 2019-11-21 NOTE — Progress Notes (Signed)
Phone [phone] In person visit   Subjective:   Terry Wright is a 64 y.o. year old very pleasant male patient who presents for/with See problem oriented charting Chief Complaint  Patient presents with  . Medication Management    This visit occurred during the SARS-CoV-2 public health emergency.  Safety protocols were in place, including screening questions prior to the visit, additional usage of staff PPE, and extensive cleaning of exam room while observing appropriate contact time as indicated for disinfecting solutions.   Past Medical History-  Patient Active Problem List   Diagnosis Date Noted  . CAD S/P percutaneous coronary angioplasty 09/21/2016    Priority: High  . Insomnia 09/21/2016    Priority: High  . STEMI involving left circumflex coronary artery ([place]) -complicated by cardiac arrest 08/04/2016    Priority: High  . History of DVT (deep vein thrombosis)     Priority: High  . Former smoker     Priority: High  . Centrilobular emphysema ([place]) 09/04/2017    Priority: Medium  . Ischemic cardiomyopathy 10/12/2016    Priority: Medium  . Hyperglycemia 09/21/2016    Priority: Medium  . Dyslipidemia, goal LDL below 50     Priority: Medium  . Essential hypertension     Priority: Medium  . Unstable angina (HCC)     Priority: Low  . Aortic atherosclerosis ([place]) 09/04/2017    Priority: Low  . Acute medial meniscus tear of right knee 04/04/2017    Priority: Low  . Mitral regurgitation     Priority: Low  . GERD (gastroesophageal reflux disease)     Priority: Low  . Ventricular bigeminy     Priority: Low  . Chondromalacia, right knee 04/04/2017    Medications- reviewed and updated Current Outpatient Medications  Medication Sig Dispense Refill  . acetaminophen (TYLENOL) 500 MG tablet Take 1,000 mg by mouth every 6 (six) hours as needed for headache (pain).    Marland Kitchen amLODipine (NORVASC) 2.5 MG tablet Take 1 tablet (2.5 mg total) by mouth daily. 90 tablet 1   . carvedilol (COREG) 12.5 MG tablet TAKE 1 TABLET BY MOUTH  TWICE DAILY 180 tablet 1  . clopidogrel (PLAVIX) 75 MG tablet TAKE 1 TABLET BY MOUTH  DAILY 90 tablet 3  . LORazepam (ATIVAN) 0.5 MG tablet Take 1 tablet (0.5 mg total) by mouth at bedtime as needed for anxiety. 90 tablet 1  . nitroGLYCERIN (NITROSTAT) 0.4 MG SL tablet Place 1 tablet (0.4 mg total) under the tongue every 5 (five) minutes as needed for chest pain. 25 tablet 1  . pantoprazole (PROTONIX) 40 MG tablet TAKE 1 TABLET BY MOUTH  DAILY 90 tablet 3  . rosuvastatin (CRESTOR) 20 MG tablet TAKE 1 TABLET BY MOUTH  DAILY 90 tablet 3   No current facility-administered medications for this visit.     Objective:  BP 120/62   Pulse 67   Temp 98 F (36.7 C) (Temporal)   Ht 6\' 1"  (1.854 m)   Wt 206 lb (93.4 kg)   SpO2 96%   BMI 27.18 kg/m  Gen: NAD, resting comfortably CV: RRR no murmurs rubs or gallops Lungs: CTAB no crackles, wheeze, rhonchi Ext: no edema Skin: warm, dry MSK: Positive Stinchfield's test on the left, negative on the right.  Full hip range of motion without pain with internal or external rotation.    Assessment and Plan    # Hip Pain  S: B/L hip pain worse on left and radiated to the bike. He  has exercised, stretched and still having pain. Started when he started on Metformin. He did a trial with out metformin and pain improved from 10/10 to 2/10 pain. Never went away but has gotten better.  Has an appointment with Dr.: [facility] to make sure everything is okay.  Also hurt in low back on the left- that has completely resolved. Also had stiffness with turning neck.  A/P: I have not previously seen severe arthralgias on Metformin but the time course seems to line up with this.  My suspicion is patient has some underlying arthritis and perhaps Metformin somehow caused a flareup-he is improving off medication.  We will list Metformin as an allergy   #CAD/hyperlipidemia -follows with Dr. El[facility] of [facility]  cardiology S: Patient with history of stents March 27, 2018 and August 04, 2016.  Compliant with Plavix 75 mg.  He is not using nitroglycerin but does need a refill.Patient denies any chest pain, shortness of breath, changes or changes in vision.  Lipids have been controlled with LDL under 70 on rosuvastatin 20 mg  Lab Results  Component Value Date   CHOL 133 05/02/2019   HDL 42.40 05/02/2019   LDLCALC 63 05/02/2019   LDLDIRECT 159.1 06/05/2011   TRIG 141.0 05/02/2019   CHOLHDL 3 05/02/2019  A/P: Asymptomatic CAD-refill nitroglycerin since all prescription is expired. -Appears Dr. Ellyn Hack would prefer to get LDL under 50-we discussed briefly increasing his dose of rosuvastatin but we opted to wait until next visit given recent severe hip pain  #hypertension S: compliant with carvedilol 6.25 mg twice daily, amlodipine 2.5 mg.  Does not add salt to food. Tries to maintain a heart healthy diet.  A/P:  Stable/well-controlled. Continue current medications.    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 -COVID-19 pandemic is also significant stressor-doing better now that he has been able to get back to the gym but recent worsening of pain has also been a stressor -Ativan primarily for sleep-has racing thoughts before bed  A/P: Patient is only using Ativan for sleep at this point-I refilled medication for 90-day supply-previously was using in crowded spaces or flights but has not had these recently   # Hyperglycemia/insulin resistance/prediabetes S: Exercise and diet-eats reasonably healthy diet.  Gets 8-10,000 steps per day at least.  We started Metformin September 16, 2019 at 250 mg due to A1c elevations of 6.5 by phlebotomy and later 6.0 by point-of-care test to try to prevent diabetes but unfortunately patient had severe arthralgias as above A/P: We will recheck A1c at follow-up.  Remain off Metformin and list as allergy.  Hopefully no worsening of  A1c-continue lifestyle changes  Recommended follow up: Keep already scheduled visit in August-update blood work at that time Future Appointments  Date Time Provider [place]  03/15/2020  2:40 PM Marin Olp, MD [facility]    Lab/Order associations:   ICD-10-CM   1. CAD S/P percutaneous coronary angioplasty  I25.10    Z98.61   2. Hyperglycemia  R73.9   3. Insomnia, unspecified type  G47.00   4. Essential hypertension  I10   5. Dyslipidemia, goal LDL below 50  E78.5     Meds ordered this encounter  Medications  . LORazepam (ATIVAN) 0.5 MG tablet    Sig: Take 1 tablet (0.5 mg total) by mouth at bedtime as needed for anxiety.    Dispense:  90 tablet    Refill:  1  . nitroGLYCERIN (NITROSTAT) 0.4 MG SL  tablet    Sig: Place 1 tablet (0.4 mg total) under the tongue every 5 (five) minutes as needed for chest pain.    Dispense:  25 tablet    Refill:  1   Return precautions advised.  Garret Reddish, MD

## 2019-11-21 NOTE — Addendum Note (Signed)
Addended by: Francella Solian on: 11/21/2019 03:32 PM   Modules accepted: Orders

## 2019-11-21 NOTE — Patient Instructions (Addendum)
Health Maintenance Due  Topic Date Due  . COLONOSCOPY  Would like to hold off until age 64-call us as soon as you turn 42 so we can place the order will 09/06/2015   I am glad your pain has improved coming off Metformin.  I agree with you I think the Metformin may have been the cause-likely caused a flare of arthritis in several joints-particularly the hips.  You may have some underlying/lingering arthritis in the left hip-glad you are seeing Dr. Alvan Dame.  We will update blood work at next visit-lets focus on healthy eating/regular exercise instead of medication at this point.  I did list Metformin as an allergy  Recommended follow up: Return for next already scheduled visit.  Or sooner if you need [place]

## 2019-11-21 NOTE — Assessment & Plan Note (Signed)
S: Exercise and diet-eats reasonably healthy diet.  Gets 8-10,000 steps per day at least.  We started Metformin September 16, 2019 at 250 mg due to A1c elevations of 6.5 by phlebotomy and later 6.0 by point-of-care test to try to prevent diabetes but unfortunately patient had severe arthralgias as above A/P: We will recheck A1c at follow-up.  Remain off Metformin and list as allergy.  Hopefully no worsening of A1c-continue lifestyle changes

## 2020-01-05 ENCOUNTER — Encounter: Payer: Self-pay | Admitting: Family Medicine

## 2020-01-06 ENCOUNTER — Other Ambulatory Visit: Payer: Self-pay

## 2020-01-06 MED ORDER — LORAZEPAM 0.5 MG PO TABS: 0.5000 mg | ORAL_TABLET | Freq: Every evening | ORAL | 1 refills | Status: DC | PRN

## 2020-01-06 NOTE — Progress Notes (Signed)
I refilled this 

## 2020-01-08 ENCOUNTER — Encounter: Payer: Self-pay | Admitting: Family Medicine

## 2020-01-15 ENCOUNTER — Encounter: Payer: Self-pay | Admitting: Family Medicine

## 2020-01-29 ENCOUNTER — Other Ambulatory Visit: Payer: Self-pay | Admitting: General Practice

## 2020-01-30 MED ORDER — ROSUVASTATIN CALCIUM 20 MG PO TABS: 20.0000 mg | ORAL_TABLET | Freq: Every day | ORAL | 2 refills | Status: DC

## 2020-01-30 MED ORDER — AMLODIPINE BESYLATE 2.5 MG PO TABS: 2.5000 mg | ORAL_TABLET | Freq: Every day | ORAL | 2 refills | Status: DC

## 2020-01-30 MED ORDER — PANTOPRAZOLE SODIUM 40 MG PO TBEC: 40.0000 mg | DELAYED_RELEASE_TABLET | Freq: Every day | ORAL | 2 refills | Status: DC

## 2020-01-30 MED ORDER — CARVEDILOL 12.5 MG PO TABS: 12.5000 mg | ORAL_TABLET | Freq: Two times a day (BID) | ORAL | 2 refills | Status: DC

## 2020-01-30 MED ORDER — CLOPIDOGREL BISULFATE 75 MG PO TABS: 75.0000 mg | ORAL_TABLET | Freq: Every day | ORAL | 2 refills | Status: DC

## 2020-02-05 DIAGNOSIS — M1612 Unilateral primary osteoarthritis, left hip: Secondary | ICD-10-CM | POA: Diagnosis not present

## 2020-02-05 DIAGNOSIS — M25552 Pain in left hip: Secondary | ICD-10-CM | POA: Diagnosis not present

## 2020-02-09 ENCOUNTER — Encounter: Payer: Self-pay | Admitting: Family Medicine

## 2020-02-23 ENCOUNTER — Telehealth: Payer: Self-pay | Admitting: Cardiology

## 2020-02-23 NOTE — Telephone Encounter (Signed)
Patient returned call with needed information for clearance.      [facility] Pre-operative Risk Assessment    HEARTCARE STAFF: - Please ensure there is not already an duplicate clearance open for this procedure. - Under Visit Info/Reason for Call, type in Other and utilize the format Clearance MM/DD/YY or Clearance TBD. Do not use dashes or single digits. - If request is for dental extraction, please clarify the # of teeth to be extracted.  Request for surgical clearance:  1. What type of surgery is being performed? Left Total hip replacement  2. When is this surgery scheduled?  04/08/2020   3. What type of clearance is required (medical clearance vs. Pharmacy clearance to hold med vs. Both)?  BOTH  4. Are there any medications that need to be held prior to surgery and how long? Plavix, 7 days prior  5. Practice name and name of physician performing surgery? [facility], Dr. Alvan Dame   6. What is the office phone number?  [phone]   7.   What is the office fax number? [phone]  2.   Anesthesia type (None, local, MAC, general) ? Patient not sure   Terry Wright 02/23/2020, 5:20 PM  _________________________________________________________________   (provider comments below)

## 2020-02-23 NOTE — Telephone Encounter (Signed)
Left a detailed voice message for the patient to give our office a call back and to ask for the pre op pool so that the needed information can be given for the clearance.

## 2020-02-23 NOTE — Telephone Encounter (Signed)
Follow Up:   Pt is checking on the status of surgical clearance

## 2020-02-23 NOTE — Telephone Encounter (Signed)
Please check to see if a formal cardiac clearance request is available?

## 2020-02-23 NOTE — Telephone Encounter (Signed)
[facility] is returning [employer].

## 2020-02-23 NOTE — Telephone Encounter (Signed)
Patient returned call with needed information for clearance. Call requesting office for Anesthesia type.

## 2020-02-24 ENCOUNTER — Encounter: Payer: Self-pay | Admitting: Family Medicine

## 2020-02-24 NOTE — Telephone Encounter (Signed)
Dr Ellyn Hack need clearance to hold Plavix in this patient for hip surgery.  S/P STEMI 2018-CXF Synergy DES then PCI to [facility] DES Aug 2019.  Please respond to CV DIV PRE OP.  Thanks  Kerin Ransom PA-C 02/24/2020 10:13 AM

## 2020-02-24 NOTE — Telephone Encounter (Signed)
Ok to hold Plavix 7 d prior to Sgx/.   [name], MD

## 2020-02-24 NOTE — Telephone Encounter (Signed)
Patient's surgery is in Sept- he needs to be call mid August.  Kerin Ransom PA-C 02/24/2020 4:41 PM

## 2020-02-27 ENCOUNTER — Telehealth: Payer: Self-pay | Admitting: *Deleted

## 2020-02-27 NOTE — Telephone Encounter (Signed)
   [facility] Pre-operative Risk Assessment    HEARTCARE STAFF: - Please ensure there is not already an duplicate clearance open for this procedure. - Under Visit Info/Reason for Call, type in Other and utilize the format Clearance MM/DD/YY or Clearance TBD. Do not use dashes or single digits. - If request is for dental extraction, please clarify the # of teeth to be extracted.  Request for surgical clearance:  1. What type of surgery is being performed? Left total hip arthoplasty  2. When is this surgery scheduled? TBD  3. What type of clearance is required (medical clearance vs. Pharmacy clearance to hold med vs. Both)? medical  4. Are there any medications that need to be held prior to surgery and how long? none  5. Practice name and name of physician performing surgery? [facility]  6. What is the office phone number? [phone]   7.   What is the office fax number? [phone] attn sherry willis  8.   Anesthesia type (None, local, MAC, general) ? spinal   Fredia Beets 02/27/2020, 7:51 AM  _________________________________________________________________   (provider comments below)

## 2020-02-27 NOTE — Telephone Encounter (Signed)
   Primary Cardiologist: [name], MD  Chart reviewed as part of pre-operative protocol coverage. Given past medical history and time since last visit, based on ACC/AHA guidelines, Terry Wright would be at acceptable risk for the planned procedure without further cardiovascular testing.   He has a class II risk, 0.9% risk of major cardiac event.  He is able to complete greater than 4 METS of physical activity.  He may hold his Plavix for 7 days prior to the procedure.  Please resume as soon as hemostasis is achieved  I will route this recommendation to the requesting party via Epic fax function and remove from pre-op pool.  Please call with questions.  Jossie Ng. Dustine Stickler NP-C    02/27/2020, 2:03 PM [place] [address] Office [phone] Fax [phone]

## 2020-03-10 NOTE — Telephone Encounter (Signed)
   Primary Cardiologist: [name], MD  Chart reviewed and patient contacted by phone today as part of pre-operative protocol coverage. Given past medical history and time since last visit, based on ACC/AHA guidelines, Terry Wright would be at acceptable risk for the planned procedure without further cardiovascular testing.   Okay to hold Plavix 7 days preop and resume as soon as safe postop.  I will route this recommendation to the requesting party via Epic fax function and remove from pre-op pool.  Please call with questions.  Kerin Ransom, PA-C 03/10/2020, 9:33 AM

## 2020-03-11 NOTE — Progress Notes (Signed)
Phone: [phone]      Subjective:  Patient presents today for their annual physical. Chief complaint-noted.   See problem oriented charting- Review of Systems  Constitutional: Negative for chills and fever.  HENT: Negative for ear pain, hearing loss and tinnitus.   Eyes: Negative for blurred vision, double vision and photophobia.  Respiratory: Negative for cough, shortness of breath and wheezing.   Cardiovascular: Negative for chest pain, palpitations and leg swelling.  Gastrointestinal: Negative for constipation, heartburn, nausea and vomiting.  Genitourinary: Negative for dysuria, frequency and urgency.  Musculoskeletal: Positive for joint pain. Negative for back pain and neck pain.       Has hip replacement scheduled for next month   Skin: Negative for rash.  Neurological: Negative for dizziness, seizures and headaches.  Endo/Heme/Allergies: Does not bruise/bleed easily.  Psychiatric/Behavioral: Negative for depression, memory loss and suicidal ideas. The patient does not have insomnia.     The following were reviewed and entered/updated in epic: Past Medical History:  Diagnosis Date  . CAD S/P percutaneous coronary angioplasty 07/2016, 02/2018   a) 100% pCx -- DES PCI with 4.0 x 16 mm Synergy DES. 65% ostial OM 3 (relatively small caliber - Med Rx);; b) 03/27/18 PCI/DES to dLCX/OM3 x1. Patent pCX DES. 700% dRCA trifurcation lesion (Med Rx). Normal EF  . DVT (deep venous thrombosis) (HCC)    Right upper arm DVT on 2 occasions in the remote past  . Ejection fraction    LV function normal, echo, February, 2010  . GERD (gastroesophageal reflux disease)   . Hyperlipidemia   . Hypertension   . Mitral regurgitation    Mild, echo, February, 2010  . Palpitations    Mild in the past  . Statin intolerance    Felt poorly after Lipitor, Crestor, TriCor  . STEMI involving left circumflex coronary artery ([place]) 08/04/2016   Occluded very large caliber codominant circumflex - PCI with  single Synergy DES  . Tobacco abuse    Patient Active Problem List   Diagnosis Date Noted  . CAD S/P percutaneous coronary angioplasty 09/21/2016    Priority: High  . Insomnia 09/21/2016    Priority: High  . STEMI involving left circumflex coronary artery ([place]) -complicated by cardiac arrest 08/04/2016    Priority: High  . History of DVT (deep vein thrombosis)     Priority: High  . Former smoker     Priority: High  . Centrilobular emphysema ([place]) 09/04/2017    Priority: Medium  . Ischemic cardiomyopathy 10/12/2016    Priority: Medium  . Hyperglycemia 09/21/2016    Priority: Medium  . Dyslipidemia, goal LDL below 50     Priority: Medium  . Essential hypertension     Priority: Medium  . Unstable angina (HCC)     Priority: Low  . Aortic atherosclerosis ([place]) 09/04/2017    Priority: Low  . Acute medial meniscus tear of right knee 04/04/2017    Priority: Low  . Mitral regurgitation     Priority: Low  . GERD (gastroesophageal reflux disease)     Priority: Low  . Ventricular bigeminy     Priority: Low  . Chondromalacia, right knee 04/04/2017   Past Surgical History:  Procedure Laterality Date  . CARDIAC CATHETERIZATION N/A 08/04/2016   Procedure: Left Heart Cath and Coronary Angiography;  Surgeon: Leonie Man, MD;  Location: [place] [facility];  Service: Cardiovascular;  Laterality: N/A: 100% pCx --> PCI. residual 65% pOM3 (small).  ~EF 35-45%  with lateral HK.  Marland Kitchen  CARDIAC CATHETERIZATION N/A 08/04/2016   Procedure: CORONARY STENT INTERVENTION;  Surgeon: Leonie Man, MD;  Location: [place] [facility];  Service: Cardiovascular;  Laterality: N/A: pCx 100%-0%: Synergy DES 4.0 x 16 (4.6 mm)  . CHONDROPLASTY Right 04/04/2017   Procedure: CHONDROPLASTY;  Surgeon: Dorna Leitz, MD;  Location: [facility];  Service: Orthopedics;  Laterality: Right;  . CORONARY STENT INTERVENTION N/A 03/27/2018   Procedure: CORONARY STENT INTERVENTION;  Surgeon: Martinique, Peter M, MD;  Location: [place]  [facility];  Service: Cardiovascular:: dCx-OM3 95%-0%: DES PCI - Synergy 2.25 x 16 mm.  Marland Kitchen GANGLION CYST EXCISION     oct 2017  . HERNIA REPAIR     umbilical hernia 2017 oct  . KNEE ARTHROSCOPY Right 04/04/2017   Procedure: ARTHROSCOPY RIGHT KNEE, WITH MEIDAL FEMORAL CONDYLE PATELLAR MEDIAL FEMORAL JOINT PLICA EXCISION ;  Surgeon: Dorna Leitz, MD;  Location: [facility];  Service: Orthopedics;  Laterality: Right;  . LEFT HEART CATH AND CORONARY ANGIOGRAPHY N/A 03/27/2018   Procedure: LEFT HEART CATH AND CORONARY ANGIOGRAPHY;  Surgeon: Martinique, Peter M, MD;  Location: [facility];  Service: Cardiovascular:   2 vessel obstructive CAD: - 95% dLCx/OM3 --Successful PCI of the dLCx/OM3 with SYNERGY DES 2.25X16, -70% distal RCA at trifurcation (Med Management).  Widely patent pLCx DES. Low normal LV function (50-55%), Normal LVEDP   . TRANSTHORACIC ECHOCARDIOGRAM  08/05/2016   post STEMI: EF 40-45%. Mild concentric LVH. Severe HK of basal-mid inferolateral wall consistent with infarct in this distribution. GR 1 DD  . TRANSTHORACIC ECHOCARDIOGRAM  01/11/2017   EF 50-55%. GR 1 DD. Mild to moderate LA dilation.    Family History  Problem Relation Age of Onset  . Heart attack Mother        x2- lived into 66s  . Hypertension Father        lived into 86s  . Other Brother        killed in [place]    Medications- reviewed and updated Current Outpatient Medications  Medication Sig Dispense Refill  . acetaminophen (TYLENOL) 500 MG tablet Take 1,000 mg by mouth every 6 (six) hours as needed for headache (pain).    Marland Kitchen amLODipine (NORVASC) 2.5 MG tablet Take 1 tablet (2.5 mg total) by mouth daily. 90 tablet 2  . carvedilol (COREG) 12.5 MG tablet Take 1 tablet (12.5 mg total) by mouth 2 (two) times daily. 180 tablet 2  . clopidogrel (PLAVIX) 75 MG tablet Take 1 tablet (75 mg total) by mouth daily. 90 tablet 2  . LORazepam (ATIVAN) 0.5 MG tablet Take 1 tablet (0.5 mg total) by mouth at bedtime as needed for  anxiety. 90 tablet 1  . nitroGLYCERIN (NITROSTAT) 0.4 MG SL tablet Place 1 tablet (0.4 mg total) under the tongue every 5 (five) minutes as needed for chest pain. 25 tablet 1  . pantoprazole (PROTONIX) 40 MG tablet Take 1 tablet (40 mg total) by mouth daily. 90 tablet 2  . rosuvastatin (CRESTOR) 20 MG tablet Take 1 tablet (20 mg total) by mouth daily. 90 tablet 2   No current facility-administered medications for this visit.    Allergies-reviewed and updated Allergies  Allergen Reactions  . Metformin And Related     After starting metformin- Severe hip pain bilaterally worse on left. Could not turn neck. Back pain. Symptoms drastically improved off metformin    Social History   Social History Narrative   Married 10 years in 2018. 2 step kids- 17 and 40. 3 kids  but he does not have contact with. No grandkids      Retired Theatre stage manager.    Wife working [place]      Hobbies: golf (needs shoulder replacement though, working in the yard, working out- was doing this regularly even before MI- walking/treadmill   Objective  Objective:  BP 126/62   Pulse 70   Temp 98.7 F (37.1 C) (Temporal)   Ht 6\' 1"  (1.854 m)   Wt 207 lb (93.9 kg)   SpO2 97%   BMI 27.31 kg/m  Gen: NAD, resting comfortably HEENT: Mucous membranes are moist. Oropharynx normal Neck: no thyromegaly CV: RRR no murmurs rubs or gallops Lungs: CTAB no crackles, wheeze, rhonchi Abdomen: soft/nontender/nondistended/normal bowel sounds. No rebound or guarding.  Ext: no edema Skin: warm, dry Neuro: grossly normal, moves all extremities, PERRLA   Assessment and Plan  63 y.o. male presenting for annual physical.  Health Maintenance counseling: 1. Anticipatory guidance: Patient counseled regarding regular dental exams q6 months, eye exams -yearly due this year has not had appointment,  avoiding smoking and second hand smoke, limiting alcohol to 2 beverages per day -  1-2 glasses of wine daily  2. Risk factor  reduction:  Advised patient of need for regular exercise and diet rich and fruits and vegetables to reduce risk of heart attack and stroke. Exercise- limited due to hip pain. . Diet-down 8 lbs since last April- feels like prior exercise is what helped- hoping to get back to that after hip surgery. Up slightly from march when exercise slowed.  Wt Readings from Last 3 Encounters:  03/15/20 207 lb (93.9 kg)  11/21/19 206 lb (93.4 kg)  10/24/19 205 lb (93 kg)  3. Immunizations/screenings/ancillary studies-recommended flu shot in the fall. Will do at fire dept Immunization History  Administered Date(s) Administered  . Influenza,inj,Quad PF,6+ Mos 04/28/2019  . Influenza-Unspecified 05/17/2016, 05/02/2017, 04/15/2018, 04/28/2019  . PFIZER SARS-COV-2 Vaccination 08/19/2019, 09/09/2019  . Tdap 10/28/2015  . Zoster Recombinat (Shingrix) 09/17/2017, 12/23/2017  4. Prostate cancer screening- trend PSA, nocturia noted.  Lab Results  Component Value Date   PSA 2.78 05/02/2019   5. Colon cancer screening -  has not made appointment yet.  Last done in 2012 with 5-year repeat recommended we placed referral today- wants to go back to Dr. Geoffry Paradise 6. Skin cancer screening- needs follow up with dermatology- Dr. Ubaldo Glassing. advised regular sunscreen use. Denies worrisome, changing, or new skin lesions- other than spots on cheeck he will have her look at 7. former smoker- in lung cancer screening program 8. STD screening -declines-he is monogamous  Status of chronic or acute concerns   #surgery cleranace form for Left hip replacement completed today and faxed back  #CAD/hyperlipidemia -follows with Dr. Ellyn Hack of [facility] cardiology S: Patient with history of stents March 27, 2018 and August 04, 2016.  Compliant with Plavix 75 mg. Knows to hold 7 days prior to upcoming hip surgery   Lipids have been controlled with LDL under 70 on rosuvastatin 20 mg Lab Results  Component Value Date   CHOL 133 05/02/2019   HDL 42.40  05/02/2019   LDLCALC 63 05/02/2019   LDLDIRECT 159.1 06/05/2011   TRIG 141.0 05/02/2019   CHOLHDL 3 05/02/2019  A/P: hopefully controlled- update LDL today  -Appears Dr. Ellyn Hack would prefer to get LDL under 50-consider rosuvastatin 40 mg at follow-up  #hypertension S: compliant with carvedilol 12.5 mg twice daily, amlodipine 2.5 mg  A/P: Stable. Continue current medications.   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 -COVID-19 pandemic is also significant stressor- better after vaccine -Ativan primarily for sleep-has racing thoughts before bed -distracted by hip at moment  A/P: reasonably controlled- continue current medicine- ativan most nights for sleep. Also tinks would need on airplane    # Hyperglycemia/insulin resistance/prediabetes S: Exercise and diet-eats reasonably healthy diet. In past 8-10,000 steps per day at least- down due to hip.  We started Metformin September 16, 2019 at 250 mg due to A1c elevations of 6.5 by phlebotomy and later 6.0 by point-of-care test to try to prevent diabetes-but had severe arthralgias and had to stop Lab Results  Component Value Date   HGBA1C 6.0 (A) 09/16/2019  A/P:  Hopefully controlled - update a1c with labs- looked better last visit   #History of DVT in 1980sx2-both were thought to be provoked   #Former smoker-quit smoking 2015 but 40 pack years.  Enrolled in lung cancer screening program   #COPD/centrilobular emphysema-noted on CT lung cancer screening.  No shortness of breath.  Stable- continue to monitor. Some mucus related to this in upper chest.   Recommended follow up: Return in about 6 months (around 09/15/2020) for follow up- or sooner if needed. Future Appointments  Date Time Provider [place]  03/30/2020  2:00 PM [facility] PAT 3 [facility] None  05/27/2020  2:00 PM Leonie Man, MD [facility]    Lab/Order associations: not fasting   ICD-10-CM   1. Preventative health  care  Z00.00 CBC with Differential/Platelet    Comprehensive metabolic panel    Lipid panel    Hemoglobin A1c    PSA    Protime-INR    Ambulatory referral to Gastroenterology    POCT Urinalysis Dipstick (Automated)    CANCELED: Ambulatory referral to Gastroenterology  2. Hyperglycemia  R73.9 Hemoglobin A1c  3. Essential hypertension  I10 POCT Urinalysis Dipstick (Automated)  4. Dyslipidemia, goal LDL below 70  E78.5 CBC with Differential/Platelet    Comprehensive metabolic panel    Lipid panel  5. Screening for prostate cancer  Z12.5 PSA  6. Preoperative clearance  Z01.818 Protime-INR  7. Nocturia  R35.1 PSA  8. Screen for colon cancer  Z12.11 Ambulatory referral to Gastroenterology    CANCELED: Ambulatory referral to Gastroenterology    No orders of the defined types were placed in this encounter.   Return precautions advised.  Garret Reddish, MD

## 2020-03-11 NOTE — Patient Instructions (Addendum)
Please stop by lab before you go If you have mychart- we will send your results within 3 business days of [place] receiving them.  If you do not have mychart- we will call you about results within 5 business days of [place] receiving them.  *please note we are currently using [facility] which has a longer processing time than [place] typically so labs may not come back as quickly as in the past *please also note that you will see labs on mychart as soon as they post. I will later go in and write notes on them- will say "notes from Dr. Yong Channel"  Best of luck with surgery!  Health Maintenance Due  Topic Date Due  . COLONOSCOPY  We will call you within two weeks about your referral to Dr. [place]. If you do not hear within 3 weeks, give [place] a call.   09/06/2015  . INFLUENZA VACCINE - - will complete later in flu season (please let us know if you get this at another location so we can update your chart) . We should have vaccination here in 1-2 months - can call back for an appointment.   02/29/2020

## 2020-03-15 ENCOUNTER — Ambulatory Visit (INDEPENDENT_AMBULATORY_CARE_PROVIDER_SITE_OTHER): Payer: PPO | Admitting: Family Medicine

## 2020-03-15 ENCOUNTER — Other Ambulatory Visit: Payer: Self-pay

## 2020-03-15 ENCOUNTER — Encounter: Payer: Self-pay | Admitting: Family Medicine

## 2020-03-15 VITALS — BP 126/62 | HR 70 | Temp 98.7°F | Ht 73.0 in | Wt 207.0 lb

## 2020-03-15 DIAGNOSIS — Z1211 Encounter for screening for malignant neoplasm of colon: Secondary | ICD-10-CM | POA: Diagnosis not present

## 2020-03-15 DIAGNOSIS — Z0000 Encounter for general adult medical examination without abnormal findings: Secondary | ICD-10-CM | POA: Diagnosis not present

## 2020-03-15 DIAGNOSIS — R351 Nocturia: Secondary | ICD-10-CM

## 2020-03-15 DIAGNOSIS — R739 Hyperglycemia, unspecified: Secondary | ICD-10-CM

## 2020-03-15 DIAGNOSIS — Z01818 Encounter for other preprocedural examination: Secondary | ICD-10-CM

## 2020-03-15 DIAGNOSIS — I10 Essential (primary) hypertension: Secondary | ICD-10-CM

## 2020-03-15 DIAGNOSIS — Z125 Encounter for screening for malignant neoplasm of prostate: Secondary | ICD-10-CM

## 2020-03-15 DIAGNOSIS — E785 Hyperlipidemia, unspecified: Secondary | ICD-10-CM | POA: Diagnosis not present

## 2020-03-15 LAB — PSA: PSA: 3.3

## 2020-03-15 NOTE — Addendum Note (Signed)
Addended by: Jerolyn Center on: 03/15/2020 03:29 PM   Modules accepted: Orders

## 2020-03-16 ENCOUNTER — Encounter: Payer: Self-pay | Admitting: Family Medicine

## 2020-03-16 ENCOUNTER — Other Ambulatory Visit: Payer: Self-pay

## 2020-03-16 DIAGNOSIS — Z125 Encounter for screening for malignant neoplasm of prostate: Secondary | ICD-10-CM

## 2020-03-16 LAB — LIPID PANEL
Cholesterol: 144 mg/dL (ref <200)
HDL: 38 mg/dL — ABNORMAL LOW (ref >=40)
LDL Cholesterol (Calc): 67 mg/dL (calc)
Non-HDL Cholesterol (Calc): 106 mg/dL (calc) (ref <130)
Total CHOL/HDL Ratio: 3.8 (calc) (ref <5.0)
Triglycerides: 326 mg/dL — ABNORMAL HIGH (ref <150)

## 2020-03-16 LAB — COMPREHENSIVE METABOLIC PANEL
AG Ratio: 1.8 (calc) (ref 1.0–2.5)
ALT: 16 U/L (ref 9–46)
AST: 18 U/L (ref 10–35)
Albumin: 4.2 g/dL (ref 3.6–5.1)
Alkaline phosphatase (APISO): 62 U/L (ref 35–144)
BUN: 18 mg/dL (ref 7–25)
CO2: 24 mmol/L (ref 20–32)
Calcium: 9.2 mg/dL (ref 8.6–10.3)
Chloride: 103 mmol/L (ref 98–110)
Creat: 0.82 mg/dL (ref 0.70–1.25)
Globulin: 2.4 g/dL (calc) (ref 1.9–3.7)
Glucose, Bld: 150 mg/dL — ABNORMAL HIGH (ref 65–99)
Potassium: 4.2 mmol/L (ref 3.5–5.3)
Sodium: 136 mmol/L (ref 135–146)
Total Bilirubin: 0.6 mg/dL (ref 0.2–1.2)
Total Protein: 6.6 g/dL (ref 6.1–8.1)

## 2020-03-16 LAB — CBC WITH DIFFERENTIAL/PLATELET
Absolute Monocytes: 866 cells/uL (ref 200–950)
Basophils Absolute: 78 cells/uL (ref 0–200)
Basophils Relative: 1.1 %
Eosinophils Absolute: 263 cells/uL (ref 15–500)
Eosinophils Relative: 3.7 %
HCT: 42.5 % (ref 38.5–50.0)
Hemoglobin: 14.7 g/dL (ref 13.2–17.1)
Lymphs Abs: 2137 cells/uL (ref 850–3900)
MCH: 32.3 pg (ref 27.0–33.0)
MCHC: 34.6 g/dL (ref 32.0–36.0)
MCV: 93.4 fL (ref 80.0–100.0)
MPV: 10.1 fL (ref 7.5–12.5)
Monocytes Relative: 12.2 %
Neutro Abs: 3756 cells/uL (ref 1500–7800)
Neutrophils Relative %: 52.9 %
Platelets: 198 10*3/uL (ref 140–400)
RBC: 4.55 10*6/uL (ref 4.20–5.80)
RDW: 11.8 % (ref 11.0–15.0)
Total Lymphocyte: 30.1 %
WBC: 7.1 10*3/uL (ref 3.8–10.8)

## 2020-03-16 LAB — PROTIME-INR
INR: 1
Prothrombin Time: 10.6 s (ref 9.0–11.5)

## 2020-03-16 LAB — HEMOGLOBIN A1C
Hgb A1c MFr Bld: 6.1 % of total Hgb — ABNORMAL HIGH (ref <5.7)
Mean Plasma Glucose: 128 (calc)
eAG (mmol/L): 7.1 (calc)

## 2020-03-16 LAB — PSA: PSA: 3.3 ng/mL (ref <=4.0)

## 2020-03-17 ENCOUNTER — Encounter: Payer: Self-pay | Admitting: Family Medicine

## 2020-03-23 NOTE — H&P (Signed)
TOTAL HIP ADMISSION H&P  Patient is admitted for left total hip arthroplasty, anterior approach.  Subjective:  Chief Complaint:    Left hip OA / pain  HPI: Terry Wright, 64 y.o. male, has a history of pain and functional disability in the left hip(s) due to arthritis and patient has failed non-surgical conservative treatments for greater than 12 weeks to include NSAID's and/or analgesics, corticosteriod injections, use of assistive devices and activity modification.  Onset of symptoms was gradual starting <1 year ago with gradually worsening course since that time.The patient noted no past surgery on the left hip(s).  Patient currently rates pain in the left hip at 9 out of 10 with activity. Patient has worsening of pain with activity and weight bearing, trendelenberg gait, pain that interfers with activities of daily living and pain with passive range of motion. Patient has evidence of periarticular osteophytes and joint space narrowing by imaging studies. This condition presents safety issues increasing the risk of falls.  There is no current active infection.  Risks, benefits and expectations were discussed with the patient.  Risks including but not limited to the risk of anesthesia, blood clots, nerve damage, blood vessel damage, failure of the prosthesis, infection and up to and including death.  Patient understand the risks, benefits and expectations and wishes to proceed with surgery.   PCP: Marin Olp, MD  D/C Plans:       Home (ambulatory)  Post-op Meds:       No Rx given  Tranexamic Acid:      To be given - IV   Decadron:      Is to be given  FYI:      Plavix  Norco  DME:  Rx sent for - RW & 3-n-1   PT:   HEP  [facility] [address]    Patient Active Problem List   Diagnosis Date Noted  . Unstable angina ([place])   . Centrilobular emphysema ([place]) 09/04/2017  . Aortic atherosclerosis ([place]) 09/04/2017  . Acute medial meniscus tear of right knee  04/04/2017  . 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 Ischemic cardiomyopathy 10/12/2016  . CAD S/P percutaneous coronary angioplasty 09/21/2016  . Insomnia 09/21/2016  . Hyperglycemia 09/21/2016  . STEMI involving left circumflex coronary artery ([place]) -complicated by cardiac arrest 08/04/2016  . Mitral regurgitation   . History of DVT (deep vein thrombosis)   . Dyslipidemia, goal LDL below 50   . GERD (gastroesophageal reflux disease)   . Essential hypertension   . Ventricular bigeminy   . Former smoker    Past Medical History:  Diagnosis Date  . CAD S/P percutaneous coronary angioplasty 07/2016, 02/2018   a) 100% pCx -- DES PCI with 4.0 x 16 mm Synergy DES. 65% ostial OM 3 (relatively small caliber - Med Rx);; b) 03/27/18 PCI/DES to dLCX/OM3 x1. Patent pCX DES. 700% dRCA trifurcation lesion (Med Rx). Normal EF  . DVT (deep venous thrombosis) ([facility])    Right upper arm DVT on 2 occasions in the remote past  . Ejection fraction    LV function normal, echo, February, 2010  . GERD (gastroesophageal reflux disease)   . Hyperlipidemia   . Hypertension   . Mitral regurgitation    Mild, echo, February, 2010  . Palpitations    Mild in the past  . Statin intolerance    Felt poorly after Lipitor, Crestor, TriCor  . STEMI involving left circumflex coronary artery ([place]) 08/04/2016   Occluded very large caliber  codominant circumflex - PCI with single Synergy DES  . Tobacco abuse     Past Surgical History:  Procedure Laterality Date  . CARDIAC CATHETERIZATION N/A 08/04/2016   Procedure: Left Heart Cath and Coronary Angiography;  Surgeon: Leonie Man, MD;  Location: [facility];  Service: Cardiovascular;  Laterality: N/A: 100% pCx --> PCI. residual 65% pOM3 (small).  ~EF 35-45%  with lateral HK.  Marland Kitchen CARDIAC CATHETERIZATION N/A 08/04/2016   Procedure: CORONARY STENT INTERVENTION;  Surgeon: Leonie Man, MD;  Location: [facility];  Service: Cardiovascular;  Laterality: N/A: pCx  100%-0%: Synergy DES 4.0 x 16 (4.6 mm)  . CHONDROPLASTY Right 04/04/2017   Procedure: CHONDROPLASTY;  Surgeon: Dorna Leitz, MD;  Location: [facility];  Service: Orthopedics;  Laterality: Right;  . CORONARY STENT INTERVENTION N/A 03/27/2018   Procedure: CORONARY STENT INTERVENTION;  Surgeon: Martinique, Peter M, MD;  Location: [facility];  Service: Cardiovascular:: dCx-OM3 95%-0%: DES PCI - Synergy 2.25 x 16 mm.  Marland Kitchen GANGLION CYST EXCISION     oct 2017  . HERNIA REPAIR     umbilical hernia 2017 oct  . KNEE ARTHROSCOPY Right 04/04/2017   Procedure: ARTHROSCOPY RIGHT KNEE, WITH MEIDAL FEMORAL CONDYLE PATELLAR MEDIAL FEMORAL JOINT PLICA EXCISION ;  Surgeon: Dorna Leitz, MD;  Location: [facility];  Service: Orthopedics;  Laterality: Right;  . LEFT HEART CATH AND CORONARY ANGIOGRAPHY N/A 03/27/2018   Procedure: LEFT HEART CATH AND CORONARY ANGIOGRAPHY;  Surgeon: Martinique, Peter M, MD;  Location: [facility];  Service: Cardiovascular:   2 vessel obstructive CAD: - 95% dLCx/OM3 --Successful PCI of the dLCx/OM3 with SYNERGY DES 2.25X16, -70% distal RCA at trifurcation (Med Management).  Widely patent pLCx DES. Low normal LV function (50-55%), Normal LVEDP   . TRANSTHORACIC ECHOCARDIOGRAM  08/05/2016   post STEMI: EF 40-45%. Mild concentric LVH. Severe HK of basal-mid inferolateral wall consistent with infarct in this distribution. GR 1 DD  . TRANSTHORACIC ECHOCARDIOGRAM  01/11/2017   EF 50-55%. GR 1 DD. Mild to moderate LA dilation.    No current facility-administered medications for this encounter.   Current Outpatient Medications  Medication Sig Dispense Refill Last Dose  . acetaminophen (TYLENOL) 500 MG tablet Take 1,000 mg by mouth every 6 (six) hours as needed for headache (pain).     Marland Kitchen amLODipine (NORVASC) 2.5 MG tablet Take 1 tablet (2.5 mg total) by mouth daily. 90 tablet 2   . carvedilol (COREG) 12.5 MG tablet Take 1 tablet (12.5 mg total) by mouth 2 (two) times daily. 180 tablet 2   .  clopidogrel (PLAVIX) 75 MG tablet Take 1 tablet (75 mg total) by mouth daily. 90 tablet 2   . LORazepam (ATIVAN) 0.5 MG tablet Take 1 tablet (0.5 mg total) by mouth at bedtime as needed for anxiety. 90 tablet 1   . nitroGLYCERIN (NITROSTAT) 0.4 MG SL tablet Place 1 tablet (0.4 mg total) under the tongue every 5 (five) minutes as needed for chest pain. 25 tablet 1   . pantoprazole (PROTONIX) 40 MG tablet Take 1 tablet (40 mg total) by mouth daily. 90 tablet 2   . rosuvastatin (CRESTOR) 20 MG tablet Take 1 tablet (20 mg total) by mouth daily. 90 tablet 2   . traMADol (ULTRAM) 50 MG tablet Take 50 mg by mouth every 6 (six) hours as needed for moderate pain.       Allergies  Allergen Reactions  . Metformin And Related     After  starting metformin- Severe hip pain bilaterally worse on left. Could not turn neck. Back pain. Symptoms drastically improved off metformin    Social History   Tobacco Use  . Smoking status: Former Smoker    Packs/day: 1.00    Years: 43.00    Pack years: 43.00    Quit date: 2015    Years since quitting: 6.6  . Smokeless tobacco: Never Used  . Tobacco comment: Former smoker quit 2015  Substance Use Topics  . Alcohol use: Yes    Alcohol/week: 7.0 standard drinks    Types: 7 Glasses of wine per week    Comment: occasional    Family History  Problem Relation Age of Onset  . Heart attack Mother        x2- lived into 25s  . Hypertension Father        lived into 30s  . Other Brother        killed in [place]     Review of Systems  Constitutional: Negative.   HENT: Negative.   Eyes: Negative.   Respiratory: Negative.   Cardiovascular: Negative.   Gastrointestinal: Positive for heartburn.  Genitourinary: Negative.   Musculoskeletal: Positive for joint pain.  Skin: Negative.   Neurological: Negative.   Endo/Heme/Allergies: Negative.   Psychiatric/Behavioral: The patient has insomnia.      Objective:  Physical Exam Constitutional:      Appearance: He  is well-developed.  HENT:     Head: Normocephalic.  Eyes:     Pupils: Pupils are equal, round, and reactive to light.  Neck:     Thyroid: No thyromegaly.     Vascular: No JVD.     Trachea: No tracheal deviation.  Cardiovascular:     Rate and Rhythm: Normal rate and regular rhythm.  Pulmonary:     Effort: Pulmonary effort is normal. No respiratory distress.     Breath sounds: Normal breath sounds. No wheezing.  Abdominal:     Palpations: Abdomen is soft.     Tenderness: There is no abdominal tenderness. There is no guarding.  Musculoskeletal:     Cervical back: Neck supple.     Left hip: Tenderness and bony tenderness present. No deformity. Decreased range of motion. Decreased strength.  Lymphadenopathy:     Cervical: No cervical adenopathy.  Skin:    General: Skin is warm and dry.  Neurological:     Mental Status: He is alert and oriented to person, place, and time.       Labs:  Estimated body mass index is 27.31 kg/m as calculated from the following:   Height as of 03/15/20: 6\' 1"  (1.854 m).   Weight as of 03/15/20: 93.9 kg.   Imaging Review Plain radiographs demonstrate severe degenerative joint disease of the left hip. The bone quality appears to be good for age and reported activity level.      Assessment/Plan:  End stage arthritis, left hip  The patient history, physical examination, clinical judgement of the provider and imaging studies are consistent with end stage degenerative joint disease of the left hip(s) and total hip arthroplasty is deemed medically necessary. The treatment options including medical management, injection therapy, arthroscopy and arthroplasty were discussed at length. The risks and benefits of total hip arthroplasty were presented and reviewed. The risks due to aseptic loosening, infection, stiffness, dislocation/subluxation,  thromboembolic complications and other imponderables were discussed.  The patient acknowledged the explanation,  agreed to proceed with the plan and consent was signed. Patient is being admitted for  treatment for surgery, pain control, PT, OT, prophylactic antibiotics, VTE prophylaxis, progressive ambulation and ADL's and discharge planning.The patient is planning to be discharged home.

## 2020-03-24 ENCOUNTER — Encounter: Payer: Self-pay | Admitting: Family Medicine

## 2020-03-26 ENCOUNTER — Other Ambulatory Visit: Payer: Self-pay | Admitting: *Deleted

## 2020-03-26 NOTE — Patient Instructions (Addendum)
DUE TO COVID-19 ONLY ONE VISITOR IS ALLOWED TO COME WITH YOU AND STAY IN THE WAITING ROOM ONLY DURING PRE OP AND PROCEDURE DAY OF SURGERY. THE 1 VISITOR  MAY VISIT WITH YOU AFTER SURGERY IN YOUR PRIVATE ROOM DURING VISITING HOURS ONLY!  YOU NEED TO HAVE A COVID 19 TEST ON__9/6_____ @__9 :15_____, THIS TEST MUST BE DONE BEFORE SURGERY,  COVID TESTING SITE [place] [id number], IT IS ON THE RIGHT GOING OUT [address] APPROXIMATELY  2 MINUTES PAST [employer] ON THE RIGHT. ONCE YOUR COVID TEST IS COMPLETED,  PLEASE BEGIN THE QUARANTINE INSTRUCTIONS AS OUTLINED IN YOUR HANDOUT.                [place]    Your procedure is scheduled on: 04/08/20   Report to [facility] Main  Entrance   Report to admitting at  6:25 AM     Call this number if you have problems the morning of surgery [place], NO CHEWING GUM [place].   No food after midnight.    You may have clear liquid until 5:30 AM.    At 5:30 AM drink pre surgery drink  . Nothing by mouth after 5:30 AM.    Take these medicines the morning of surgery with A SIP OF WATER: Amlodipine, Carvedolol, Protonix                                 You may not have any metal on your body including               piercings  Do not wear jewelry,  lotions, powders or deodorant                       Men may shave face and neck.   Do not bring valuables to the hospital. [place].  Contacts, dentures or bridgework may not be worn into surgery.  .     Patients discharged the day of surgery will not be allowed to drive home.   IF YOU ARE HAVING SURGERY AND GOING HOME THE SAME DAY, YOU MUST HAVE AN ADULT TO DRIVE YOU HOME AND BE WITH YOU FOR 24 HOURS  . YOU MAY GO HOME BY TAXI OR UBER OR ORTHERWISE, BUT AN ADULT MUST ACCOMPANY YOU HOME AND STAY WITH YOU FOR 24  HOURS.  Name and phone number of your driver:  Special Instructions: N/A              Please read over the following fact sheets you were given: _____________________________________________________________________             [facility] - Preparing for Surgery  Before surgery, you can play an important role. Because skin is not sterile, your skin needs to be as free of germs as possible.   You can reduce the number of germs on your skin by washing with CHG (chlorahexidine gluconate) soap before surgery.   CHG is an antiseptic cleaner which kills germs and bonds with the skin to continue killing germs even after washing. Please DO NOT use if you have an allergy to CHG or antibacterial soaps.   If your skin becomes reddened/irritated  stop using the CHG and inform your nurse when you arrive at Short Stay. You may shave your face/neck.  Please follow these instructions carefully:  1.  Shower with CHG Soap the night before surgery and the  morning of Surgery.  2.  If you choose to wash your hair, wash your hair first as usual with your  normal  shampoo.  3.  After you shampoo, rinse your hair and body thoroughly to remove the  shampoo.                                        4.  Use CHG as you would any other liquid soap.  You can apply chg directly  to the skin and wash                       Gently with a scrungie or clean washcloth.  5.  Apply the CHG Soap to your body ONLY FROM THE NECK DOWN.   Do not use on face/ open                           Wound or open sores. Avoid contact with eyes, ears mouth and genitals (private parts).                       Wash face,  Genitals (private parts) with your normal soap.             6.  Wash thoroughly, paying special attention to the area where your surgery  will be performed.  7.  Thoroughly rinse your body with warm water from the neck down.  8.  DO NOT shower/wash with your normal soap after using and rinsing off  the CHG Soap.             9.   Pat yourself dry with a clean towel.            10.  Wear clean pajamas.            11.  Place clean sheets on your bed the night of your first shower and do not  sleep with pets. Day of Surgery : Do not apply any lotions/deodorants the morning of surgery.  Please wear clean clothes to the hospital/surgery center.  FAILURE TO FOLLOW THESE INSTRUCTIONS MAY RESULT IN THE CANCELLATION OF YOUR SURGERY PATIENT SIGNATURE_________________________________  NURSE SIGNATURE__________________________________  ________________________________________________________________________   [name]  An incentive spirometer is a tool that can help keep your lungs clear and active. This tool measures how well you are filling your lungs with each breath. Taking long deep breaths may help reverse or decrease the chance of developing breathing (pulmonary) problems (especially infection) following:  A long period of time when you are unable to move or be active. BEFORE THE PROCEDURE   If the spirometer includes an indicator to show your best effort, your nurse or respiratory therapist will set it to a desired goal.  If possible, sit up straight or lean slightly forward. Try not to slouch.  Hold the incentive spirometer in an upright position. INSTRUCTIONS FOR USE  1. Sit on the edge of your bed if possible, or sit up as far as you can in bed or on a chair. 2. Hold the incentive spirometer in an upright position. 3. Breathe out  normally. 4. Place the mouthpiece in your mouth and seal your lips tightly around it. 5. Breathe in slowly and as deeply as possible, raising the piston or the ball toward the top of the column. 6. Hold your breath for 3-5 seconds or for as long as possible. Allow the piston or ball to fall to the bottom of the column. 7. Remove the mouthpiece from your mouth and breathe out normally. 8. Rest for a few seconds and repeat Steps 1 through 7 at least 10 times every 1-2  hours when you are awake. Take your time and take a few normal breaths between deep breaths. 9. The spirometer may include an indicator to show your best effort. Use the indicator as a goal to work toward during each repetition. 10. After each set of 10 deep breaths, practice coughing to be sure your lungs are clear. If you have an incision (the cut made at the time of surgery), support your incision when coughing by placing a pillow or rolled up towels firmly against it. Once you are able to get out of bed, walk around indoors and cough well. You may stop using the incentive spirometer when instructed by your caregiver.  RISKS AND COMPLICATIONS  Take your time so you do not get dizzy or light-headed.  If you are in pain, you may need to take or ask for pain medication before doing incentive spirometry. It is harder to take a deep breath if you are having pain. AFTER USE  Rest and breathe slowly and easily.  It can be helpful to keep track of a log of your progress. Your caregiver can provide you with a simple table to help with this. If you are using the spirometer at home, follow these instructions: [place] IF:   You are having difficultly using the spirometer.  You have trouble using the spirometer as often as instructed.  Your pain medication is not giving enough relief while using the spirometer.  You develop fever of 100.5 F (38.1 C) or higher. SEEK IMMEDIATE MEDICAL CARE IF:   You cough up bloody sputum that had not been present before.  You develop fever of 102 F (38.9 C) or greater.  You develop worsening pain at or near the incision site. MAKE SURE YOU:   Understand these instructions.  Will watch your condition.  Will get help right away if you are not doing well or get worse. Document Released: 11/27/2006 Document Revised: 10/09/2011 Document Reviewed: 01/28/2007 [facility] Patient Information 2014 [place],  [place].   ________________________________________________________________________

## 2020-03-26 NOTE — Telephone Encounter (Signed)
Please advise 

## 2020-03-29 ENCOUNTER — Other Ambulatory Visit: Payer: Self-pay

## 2020-03-29 DIAGNOSIS — M160 Bilateral primary osteoarthritis of hip: Secondary | ICD-10-CM

## 2020-03-29 DIAGNOSIS — M25552 Pain in left hip: Secondary | ICD-10-CM

## 2020-03-29 DIAGNOSIS — M25551 Pain in right hip: Secondary | ICD-10-CM

## 2020-03-29 NOTE — Telephone Encounter (Signed)
FYI; Please see below. Sent to Pt on accident.

## 2020-03-30 ENCOUNTER — Encounter (HOSPITAL_COMMUNITY): Payer: Self-pay

## 2020-03-30 ENCOUNTER — Encounter (HOSPITAL_COMMUNITY)
Admission: RE | Admit: 2020-03-30 | Discharge: 2020-03-30 | Disposition: A | Discharge status: Home / Self Care | Payer: PPO | Source: Ambulatory Visit | Attending: Orthopedic Surgery | Admitting: Orthopedic Surgery

## 2020-03-30 ENCOUNTER — Other Ambulatory Visit: Payer: Self-pay

## 2020-03-30 DIAGNOSIS — K219 Gastro-esophageal reflux disease without esophagitis: Secondary | ICD-10-CM | POA: Diagnosis not present

## 2020-03-30 DIAGNOSIS — E785 Hyperlipidemia, unspecified: Secondary | ICD-10-CM | POA: Insufficient documentation

## 2020-03-30 DIAGNOSIS — Z7902 Long term (current) use of antithrombotics/antiplatelets: Secondary | ICD-10-CM | POA: Diagnosis not present

## 2020-03-30 DIAGNOSIS — Z01812 Encounter for preprocedural laboratory examination: Secondary | ICD-10-CM | POA: Insufficient documentation

## 2020-03-30 DIAGNOSIS — Z86718 Personal history of other venous thrombosis and embolism: Secondary | ICD-10-CM | POA: Diagnosis not present

## 2020-03-30 DIAGNOSIS — M1612 Unilateral primary osteoarthritis, left hip: Secondary | ICD-10-CM | POA: Diagnosis not present

## 2020-03-30 DIAGNOSIS — Z79899 Other long term (current) drug therapy: Secondary | ICD-10-CM | POA: Insufficient documentation

## 2020-03-30 DIAGNOSIS — I2510 Atherosclerotic heart disease of native coronary artery without angina pectoris: Secondary | ICD-10-CM | POA: Insufficient documentation

## 2020-03-30 DIAGNOSIS — I252 Old myocardial infarction: Secondary | ICD-10-CM | POA: Diagnosis not present

## 2020-03-30 DIAGNOSIS — R7303 Prediabetes: Secondary | ICD-10-CM | POA: Diagnosis not present

## 2020-03-30 DIAGNOSIS — Z87891 Personal history of nicotine dependence: Secondary | ICD-10-CM | POA: Insufficient documentation

## 2020-03-30 DIAGNOSIS — J449 Chronic obstructive pulmonary disease, unspecified: Secondary | ICD-10-CM | POA: Diagnosis not present

## 2020-03-30 HISTORY — DX: Prediabetes: R73.03

## 2020-03-30 HISTORY — DX: Anxiety disorder, unspecified: F41.9

## 2020-03-30 HISTORY — DX: Cardiac arrhythmia, unspecified: I49.9

## 2020-03-30 HISTORY — DX: Peripheral vascular disease, unspecified: I73.9

## 2020-03-30 HISTORY — DX: Unspecified osteoarthritis, unspecified site: M19.90

## 2020-03-30 LAB — BASIC METABOLIC PANEL
Anion gap: 7 (ref 5–15)
BUN: 22 mg/dL (ref 8–23)
CO2: 26 mmol/L (ref 22–32)
Calcium: 9.1 mg/dL (ref 8.9–10.3)
Chloride: 108 mmol/L (ref 98–111)
Creatinine, Ser: 0.92 mg/dL (ref 0.61–1.24)
GFR calc Af Amer: >60 mL/min (ref >60)
GFR calc non Af Amer: >60 mL/min (ref >60)
Glucose, Bld: 117 mg/dL — ABNORMAL HIGH (ref 70–99)
Potassium: 4.2 mmol/L (ref 3.5–5.1)
Sodium: 141 mmol/L (ref 135–145)

## 2020-03-30 LAB — CBC
HCT: 45.3 % (ref 39.0–52.0)
Hemoglobin: 15 g/dL (ref 13.0–17.0)
MCH: 31.6 pg (ref 26.0–34.0)
MCHC: 33.1 g/dL (ref 30.0–36.0)
MCV: 95.6 fL (ref 80.0–100.0)
Platelets: 211 10*3/uL (ref 150–400)
RBC: 4.74 MIL/uL (ref 4.22–5.81)
RDW: 12 % (ref 11.5–15.5)
WBC: 7.7 10*3/uL (ref 4.0–10.5)
nRBC: 0 % (ref 0.0–0.2)

## 2020-03-30 LAB — SURGICAL PCR SCREEN
MRSA, PCR: NEGATIVE
Staphylococcus aureus: POSITIVE — AB

## 2020-03-30 NOTE — Progress Notes (Signed)
COVID Vaccine Completed:Yes Date COVID Vaccine completed:09/09/19 COVID vaccine manufacturer: [employer]     PCP - Dr.S. Hunter Cardiologist - Dr. Roni Bread  Chest x-ray - 08/12/19 EKG - 08/18/19 Stress Test - no ECHO - 2018 Cardiac Cath - 2018,2019  Sleep Study - No CPAP -   Fasting Blood Sugar - unknown. Pt is pre diabetic. A1c was 6.1 on 03/15/20 Checks Blood Sugar _____ times a day  Blood Thinner Instructions:Plavix/ Dr. Ellyn Hack Aspirin Instructions:Stop 7 days prior/ Ellyn Hack  and his PA Last Dose:03/31/20  Anesthesia review:   Patient denies shortness of breath, fever, cough and chest pain at PAT appointment Yes   Patient verbalized understanding of instructions that were given to them at the PAT appointment. Patient was also instructed that they will need to review over the PAT instructions again at home before surgery. Yes  Pt had a STEMI in 2018. He has been working out and very active. No SOB climbing stairs or with ADLs

## 2020-04-01 NOTE — Progress Notes (Signed)
Anesthesia Chart Review   Case: [id number] Date/Time: 04/08/20 0840   Procedure: TOTAL HIP ARTHROPLASTY ANTERIOR APPROACH (Left Hip) - 70 mins   Anesthesia type: Spinal   Pre-op diagnosis: Left hip osteoarthritis   Location: [facility] ROOM 09 / [facility]   Surgeons: Paralee Cancel, MD      DISCUSSION:64 y.o. former smoker (43 pack years, quit 07/31/13) with h/o HTN, GERD, HLD, CAD (DES 2018 and 2019), COPD, DVT, left hip OA scheduled for above procedure 04/08/2020 with Dr. Paralee Cancel.   Per cardiology preoperative risk assessment 02/27/2020, "Chart reviewed as part of pre-operative protocol coverage. Given past medical history and time since last visit, based on ACC/AHA guidelines, Emori Kamau would be at acceptable risk for the planned procedure without further cardiovascular testing.   He has a class II risk, 0.9% risk of major cardiac event.  He is able to complete greater than 4 METS of physical activity.  He may hold his Plavix for 7 days prior to the procedure.  Please resume as soon as hemostasis is achieved"  Anticipate pt can proceed with planned procedure barring acute status change.     VS: BP 139/75   Pulse 65   Temp 36.9 C (Oral)   Ht 6\' 1"  (1.854 m)   Wt 93.4 kg   SpO2 96%   BMI 27.18 kg/m   PROVIDERS: Marin Olp, MD is PCP   [name], MD is Cardiologist  LABS: Labs reviewed: Acceptable for surgery. (all labs ordered are listed, but only abnormal results are displayed)  Labs Reviewed  SURGICAL PCR SCREEN - Abnormal; Notable for the following components:      Result Value   Staphylococcus aureus POSITIVE (*)    All other components within normal limits  BASIC METABOLIC PANEL - Abnormal; Notable for the following components:   Glucose, Bld 117 (*)    All other components within normal limits  CBC  TYPE AND SCREEN     IMAGES:   EKG: 08/18/2019 Rate 61 bpm Sinus rhythm with 1st degree AV block   CV: Echo 01/11/2017 Study Conclusions    - Left ventricle: Mild lateral hypokinesis. The cavity size was  normal. Wall thickness was normal. Systolic function was normal.  The estimated ejection fraction was in the range of 50% to 55%.  Doppler parameters are consistent with abnormal left ventricular  relaxation (grade 1 diastolic dysfunction).  - Left atrium: The atrium was mildly to moderately dilated.  - Right atrium: The atrium was mildly dilated.  Past Medical History:  Diagnosis Date  . Anginal pain ([place]) 2018  . Anxiety   . Arthritis    shoulders hips,   . CAD S/P percutaneous coronary angioplasty 07/2016, 02/2018   a) 100% pCx -- DES PCI with 4.0 x 16 mm Synergy DES. 65% ostial OM 3 (relatively small caliber - Med Rx);; b) 03/27/18 PCI/DES to dLCX/OM3 x1. Patent pCX DES. 700% dRCA trifurcation lesion (Med Rx). Normal EF  . COPD (chronic obstructive pulmonary disease) ([place]) 2018   mild  . DVT (deep venous thrombosis) ([facility])    Right upper arm DVT on 2 occasions in the remote past  . Dysrhythmia    V tac  . Ejection fraction    LV function normal, echo, February, 2010  . GERD (gastroesophageal reflux disease)   . Hyperlipidemia   . Hypertension   . Mitral regurgitation    Mild, echo, February, 2010  . Palpitations    Mild in the past  .  Peripheral vascular disease ([place]) 1980s   DVT  . Pre-diabetes   . Statin intolerance    Felt poorly after Lipitor, Crestor, TriCor  . STEMI involving left circumflex coronary artery ([place]) 08/04/2016   Occluded very large caliber codominant circumflex - PCI with single Synergy DES  . Tobacco abuse     Past Surgical History:  Procedure Laterality Date  . CARDIAC CATHETERIZATION N/A 08/04/2016   Procedure: Left Heart Cath and Coronary Angiography;  Surgeon: Leonie Man, MD;  Location: [facility];  Service: Cardiovascular;  Laterality: N/A: 100% pCx --> PCI. residual 65% pOM3 (small).  ~EF 35-45%  with lateral HK.  Marland Kitchen CARDIAC CATHETERIZATION N/A 08/04/2016    Procedure: CORONARY STENT INTERVENTION;  Surgeon: Leonie Man, MD;  Location: [facility];  Service: Cardiovascular;  Laterality: N/A: pCx 100%-0%: Synergy DES 4.0 x 16 (4.6 mm)  . CHONDROPLASTY Right 04/04/2017   Procedure: CHONDROPLASTY;  Surgeon: Dorna Leitz, MD;  [facility];  Service: Orthopedics;  Laterality: Right;  . CORONARY STENT INTERVENTION N/A 03/27/2018   Procedure: CORONARY STENT INTERVENTION;  Surgeon: Martinique, Peter M, MD;  Location: [facility];  Service: Cardiovascular:: dCx-OM3 95%-0%: DES PCI - Synergy 2.25 x 16 mm.  Marland Kitchen GANGLION CYST EXCISION     oct 2017  . HERNIA REPAIR     umbilical hernia 2017 oct  . KNEE ARTHROSCOPY Right 04/04/2017   Procedure: ARTHROSCOPY RIGHT KNEE, WITH MEIDAL FEMORAL CONDYLE PATELLAR MEDIAL FEMORAL JOINT PLICA EXCISION ;  Surgeon: Dorna Leitz, MD;  [facility];  Service: Orthopedics;  Laterality: Right;  . LEFT HEART CATH AND CORONARY ANGIOGRAPHY N/A 03/27/2018   Procedure: LEFT HEART CATH AND CORONARY ANGIOGRAPHY;  Surgeon: Martinique, Peter M, MD;  Location: [facility];  Service: Cardiovascular:   2 vessel obstructive CAD: - 95% dLCx/OM3 --Successful PCI of the dLCx/OM3 with SYNERGY DES 2.25X16, -70% distal RCA at trifurcation (Med Management).  Widely patent pLCx DES. Low normal LV function (50-55%), Normal LVEDP   . TRANSTHORACIC ECHOCARDIOGRAM  08/05/2016   post STEMI: EF 40-45%. Mild concentric LVH. Severe HK of basal-mid inferolateral wall consistent with infarct in this distribution. GR 1 DD  . TRANSTHORACIC ECHOCARDIOGRAM  01/11/2017   EF 50-55%. GR 1 DD. Mild to moderate LA dilation.    MEDICATIONS: . acetaminophen (TYLENOL) 500 MG tablet  . amLODipine (NORVASC) 2.5 MG tablet  . carvedilol (COREG) 12.5 MG tablet  . clopidogrel (PLAVIX) 75 MG tablet  . LORazepam (ATIVAN) 0.5 MG tablet  . nitroGLYCERIN (NITROSTAT) 0.4 MG SL tablet  . pantoprazole (PROTONIX) 40 MG tablet  . rosuvastatin (CRESTOR) 20 MG tablet   . traMADol (ULTRAM) 50 MG tablet   No current facility-administered medications for this encounter.    Konrad Felix, PA-C [facility] [phone]

## 2020-04-02 ENCOUNTER — Encounter: Payer: Self-pay | Admitting: Family Medicine

## 2020-04-06 ENCOUNTER — Other Ambulatory Visit (HOSPITAL_COMMUNITY)
Admission: RE | Admit: 2020-04-06 | Discharge: 2020-04-06 | Disposition: A | Discharge status: Home / Self Care | Payer: PPO | Source: Ambulatory Visit | Attending: Orthopedic Surgery | Admitting: Orthopedic Surgery

## 2020-04-06 DIAGNOSIS — Z20822 Contact with and (suspected) exposure to covid-19: Secondary | ICD-10-CM | POA: Diagnosis not present

## 2020-04-06 DIAGNOSIS — Z01812 Encounter for preprocedural laboratory examination: Secondary | ICD-10-CM | POA: Diagnosis not present

## 2020-04-06 LAB — SARS CORONAVIRUS 2 (TAT 6-24 HRS): SARS Coronavirus 2: NEGATIVE

## 2020-04-08 ENCOUNTER — Ambulatory Visit (HOSPITAL_COMMUNITY)
Admission: RE | Admit: 2020-04-08 | Discharge: 2020-04-08 | Disposition: A | Discharge status: Home / Self Care | Payer: PPO | Attending: Orthopedic Surgery | Admitting: Orthopedic Surgery

## 2020-04-08 ENCOUNTER — Ambulatory Visit (HOSPITAL_COMMUNITY): Payer: PPO

## 2020-04-08 ENCOUNTER — Ambulatory Visit (HOSPITAL_COMMUNITY): Payer: PPO | Admitting: Certified Registered Nurse Anesthetist

## 2020-04-08 ENCOUNTER — Encounter (HOSPITAL_COMMUNITY): Payer: Self-pay | Admitting: Orthopedic Surgery

## 2020-04-08 ENCOUNTER — Ambulatory Visit (HOSPITAL_COMMUNITY): Payer: PPO | Admitting: Physician Assistant

## 2020-04-08 ENCOUNTER — Encounter (HOSPITAL_COMMUNITY)
Admission: RE | Disposition: A | Discharge status: Home / Self Care | Payer: Self-pay | Source: Home / Self Care | Attending: Orthopedic Surgery

## 2020-04-08 DIAGNOSIS — I255 Ischemic cardiomyopathy: Secondary | ICD-10-CM | POA: Insufficient documentation

## 2020-04-08 DIAGNOSIS — I340 Nonrheumatic mitral (valve) insufficiency: Secondary | ICD-10-CM | POA: Diagnosis not present

## 2020-04-08 DIAGNOSIS — I252 Old myocardial infarction: Secondary | ICD-10-CM | POA: Insufficient documentation

## 2020-04-08 DIAGNOSIS — Z96642 Presence of left artificial hip joint: Secondary | ICD-10-CM | POA: Diagnosis not present

## 2020-04-08 DIAGNOSIS — F419 Anxiety disorder, unspecified: Secondary | ICD-10-CM | POA: Diagnosis not present

## 2020-04-08 DIAGNOSIS — I739 Peripheral vascular disease, unspecified: Secondary | ICD-10-CM | POA: Diagnosis not present

## 2020-04-08 DIAGNOSIS — M1612 Unilateral primary osteoarthritis, left hip: Secondary | ICD-10-CM | POA: Insufficient documentation

## 2020-04-08 DIAGNOSIS — Z955 Presence of coronary angioplasty implant and graft: Secondary | ICD-10-CM | POA: Diagnosis not present

## 2020-04-08 DIAGNOSIS — Z7902 Long term (current) use of antithrombotics/antiplatelets: Secondary | ICD-10-CM | POA: Insufficient documentation

## 2020-04-08 DIAGNOSIS — J449 Chronic obstructive pulmonary disease, unspecified: Secondary | ICD-10-CM | POA: Insufficient documentation

## 2020-04-08 DIAGNOSIS — K219 Gastro-esophageal reflux disease without esophagitis: Secondary | ICD-10-CM | POA: Insufficient documentation

## 2020-04-08 DIAGNOSIS — Z419 Encounter for procedure for purposes other than remedying health state, unspecified: Secondary | ICD-10-CM

## 2020-04-08 DIAGNOSIS — I700 Atherosclerosis of aorta: Secondary | ICD-10-CM | POA: Diagnosis not present

## 2020-04-08 DIAGNOSIS — Z8249 Family history of ischemic heart disease and other diseases of the circulatory system: Secondary | ICD-10-CM | POA: Insufficient documentation

## 2020-04-08 DIAGNOSIS — Z888 Allergy status to other drugs, medicaments and biological substances status: Secondary | ICD-10-CM | POA: Insufficient documentation

## 2020-04-08 DIAGNOSIS — Z87891 Personal history of nicotine dependence: Secondary | ICD-10-CM | POA: Insufficient documentation

## 2020-04-08 DIAGNOSIS — I25110 Atherosclerotic heart disease of native coronary artery with unstable angina pectoris: Secondary | ICD-10-CM | POA: Diagnosis not present

## 2020-04-08 DIAGNOSIS — G4700 Insomnia, unspecified: Secondary | ICD-10-CM | POA: Diagnosis not present

## 2020-04-08 DIAGNOSIS — E785 Hyperlipidemia, unspecified: Secondary | ICD-10-CM | POA: Insufficient documentation

## 2020-04-08 DIAGNOSIS — Z471 Aftercare following joint replacement surgery: Secondary | ICD-10-CM | POA: Diagnosis not present

## 2020-04-08 DIAGNOSIS — M1990 Unspecified osteoarthritis, unspecified site: Secondary | ICD-10-CM | POA: Insufficient documentation

## 2020-04-08 DIAGNOSIS — Z96649 Presence of unspecified artificial hip joint: Secondary | ICD-10-CM

## 2020-04-08 DIAGNOSIS — Z79899 Other long term (current) drug therapy: Secondary | ICD-10-CM | POA: Insufficient documentation

## 2020-04-08 DIAGNOSIS — I10 Essential (primary) hypertension: Secondary | ICD-10-CM | POA: Diagnosis not present

## 2020-04-08 HISTORY — PX: TOTAL HIP ARTHROPLASTY: SHX124

## 2020-04-08 LAB — TYPE AND SCREEN
ABO/RH(D): O POS
Antibody Screen: NEGATIVE

## 2020-04-08 LAB — ABO/RH: ABO/RH(D): O POS

## 2020-04-08 SURGERY — ARTHROPLASTY, HIP, TOTAL, ANTERIOR APPROACH
Anesthesia: Spinal | Site: Hip | Laterality: Left

## 2020-04-08 MED ORDER — PROPOFOL 10 MG/ML IV BOLUS
INTRAVENOUS | Status: DC | PRN
  Administered 2020-04-08: 30 mg via INTRAVENOUS
  Administered 2020-04-08: 20 mg via INTRAVENOUS

## 2020-04-08 MED ORDER — FENTANYL CITRATE (PF) 100 MCG/2ML IJ SOLN
INTRAMUSCULAR | Status: AC
  Filled 2020-04-08: qty 2

## 2020-04-08 MED ORDER — CELECOXIB 200 MG PO CAPS
ORAL_CAPSULE | ORAL | Status: AC
  Filled 2020-04-08: qty 1

## 2020-04-08 MED ORDER — DEXAMETHASONE SODIUM PHOSPHATE 10 MG/ML IJ SOLN
10.0000 mg | Freq: Once | INTRAMUSCULAR | Status: AC
  Administered 2020-04-08: 8 mg via INTRAVENOUS

## 2020-04-08 MED ORDER — CEFAZOLIN SODIUM-DEXTROSE 1-4 GM/50ML-% IV SOLN
INTRAVENOUS | Status: AC
  Filled 2020-04-08: qty 50

## 2020-04-08 MED ORDER — MEPIVACAINE HCL (PF) 2 % IJ SOLN
INTRAMUSCULAR | Status: AC
  Filled 2020-04-08: qty 40

## 2020-04-08 MED ORDER — MIDAZOLAM HCL 2 MG/2ML IJ SOLN
INTRAMUSCULAR | Status: AC
  Filled 2020-04-08: qty 2

## 2020-04-08 MED ORDER — ONDANSETRON HCL 4 MG/2ML IJ SOLN
INTRAMUSCULAR | Status: AC
  Filled 2020-04-08: qty 2

## 2020-04-08 MED ORDER — ACETAMINOPHEN 325 MG PO TABS: 325.0000 mg | ORAL_TABLET | Freq: Four times a day (QID) | ORAL | Status: DC | PRN

## 2020-04-08 MED ORDER — METOCLOPRAMIDE HCL 5 MG/ML IJ SOLN
INTRAMUSCULAR | Status: AC
  Filled 2020-04-08: qty 2

## 2020-04-08 MED ORDER — FENTANYL CITRATE (PF) 100 MCG/2ML IJ SOLN
INTRAMUSCULAR | Status: DC | PRN
Start: 2020-04-08 — End: 2020-04-08
  Administered 2020-04-08 (×2): 50 ug via INTRAVENOUS

## 2020-04-08 MED ORDER — HYDROCODONE-ACETAMINOPHEN 7.5-325 MG PO TABS
ORAL_TABLET | ORAL | Status: AC
Start: 2020-04-08 — End: 2020-04-08
  Administered 2020-04-08: 1 via ORAL
  Filled 2020-04-08: qty 1

## 2020-04-08 MED ORDER — FERROUS SULFATE 325 (65 FE) MG PO TABS: 325.0000 mg | ORAL_TABLET | Freq: Three times a day (TID) | ORAL | 0 refills | Status: DC

## 2020-04-08 MED ORDER — ACETAMINOPHEN 500 MG PO TABS
ORAL_TABLET | ORAL | Status: AC
  Administered 2020-04-08: 1000 mg
  Filled 2020-04-08: qty 2

## 2020-04-08 MED ORDER — PROPOFOL 500 MG/50ML IV EMUL
INTRAVENOUS | Status: DC | PRN
  Administered 2020-04-08: 75 ug/kg/min via INTRAVENOUS

## 2020-04-08 MED ORDER — LACTATED RINGERS IV BOLUS
500.0000 mL | Freq: Once | INTRAVENOUS | Status: AC
  Administered 2020-04-08: 500 mL via INTRAVENOUS

## 2020-04-08 MED ORDER — HYDROCODONE-ACETAMINOPHEN 7.5-325 MG PO TABS
ORAL_TABLET | ORAL | Status: AC
Start: 2020-04-08 — End: 2020-04-08
  Filled 2020-04-08: qty 1

## 2020-04-08 MED ORDER — ACETAMINOPHEN 500 MG PO TABS
1000.0000 mg | ORAL_TABLET | Freq: Once | ORAL | Status: AC
  Administered 2020-04-08: 1000 mg via ORAL

## 2020-04-08 MED ORDER — ASPIRIN 81 MG PO CHEW: 81.0000 mg | CHEWABLE_TABLET | Freq: Every day | ORAL | 0 refills | Status: AC

## 2020-04-08 MED ORDER — METOCLOPRAMIDE HCL 5 MG PO TABS
5.0000 mg | ORAL_TABLET | Freq: Three times a day (TID) | ORAL | Status: DC | PRN
  Filled 2020-04-08: qty 2

## 2020-04-08 MED ORDER — TRANEXAMIC ACID-NACL 1000-0.7 MG/100ML-% IV SOLN
1000.0000 mg | INTRAVENOUS | Status: AC
  Administered 2020-04-08: 1000 mg via INTRAVENOUS
  Filled 2020-04-08: qty 100

## 2020-04-08 MED ORDER — MIDAZOLAM HCL 5 MG/5ML IJ SOLN
INTRAMUSCULAR | Status: DC | PRN
  Administered 2020-04-08: 2 mg via INTRAVENOUS

## 2020-04-08 MED ORDER — METHOCARBAMOL 500 MG PO TABS: 500.0000 mg | ORAL_TABLET | Freq: Four times a day (QID) | ORAL | Status: DC | PRN

## 2020-04-08 MED ORDER — METOCLOPRAMIDE HCL 5 MG/ML IJ SOLN
5.0000 mg | Freq: Three times a day (TID) | INTRAMUSCULAR | Status: DC | PRN
  Administered 2020-04-08: 10 mg via INTRAVENOUS

## 2020-04-08 MED ORDER — CELECOXIB 200 MG PO CAPS: 200.0000 mg | ORAL_CAPSULE | Freq: Two times a day (BID) | ORAL | Status: DC

## 2020-04-08 MED ORDER — HYDROCODONE-ACETAMINOPHEN 7.5-325 MG PO TABS
1.0000 | ORAL_TABLET | ORAL | Status: DC | PRN
  Administered 2020-04-08: 1 via ORAL

## 2020-04-08 MED ORDER — CHLORHEXIDINE GLUCONATE 0.12 % MT SOLN
15.0000 mL | Freq: Once | OROMUCOSAL | Status: AC
  Administered 2020-04-08: 15 mL via OROMUCOSAL

## 2020-04-08 MED ORDER — 0.9 % SODIUM CHLORIDE (POUR BTL) OPTIME
TOPICAL | Status: DC | PRN
  Administered 2020-04-08: 1000 mL

## 2020-04-08 MED ORDER — ONDANSETRON HCL 4 MG/2ML IJ SOLN
4.0000 mg | Freq: Four times a day (QID) | INTRAMUSCULAR | Status: DC | PRN
  Administered 2020-04-08: 4 mg via INTRAVENOUS

## 2020-04-08 MED ORDER — DEXAMETHASONE SODIUM PHOSPHATE 10 MG/ML IJ SOLN
INTRAMUSCULAR | Status: AC
  Filled 2020-04-08: qty 1

## 2020-04-08 MED ORDER — METHOCARBAMOL 500 MG IVPB - SIMPLE MED
500.0000 mg | Freq: Four times a day (QID) | INTRAVENOUS | Status: DC | PRN
  Administered 2020-04-08: 500 mg via INTRAVENOUS

## 2020-04-08 MED ORDER — ONDANSETRON HCL 4 MG/2ML IJ SOLN
INTRAMUSCULAR | Status: DC | PRN
  Administered 2020-04-08: 4 mg via INTRAVENOUS

## 2020-04-08 MED ORDER — FENTANYL CITRATE (PF) 100 MCG/2ML IJ SOLN
25.0000 ug | INTRAMUSCULAR | Status: DC | PRN
  Administered 2020-04-08 (×2): 50 ug via INTRAVENOUS

## 2020-04-08 MED ORDER — MEPIVACAINE HCL (PF) 2 % IJ SOLN
INTRAMUSCULAR | Status: DC | PRN
  Administered 2020-04-08: 50 mg via INTRATHECAL

## 2020-04-08 MED ORDER — LACTATED RINGERS IV SOLN: INTRAVENOUS | Status: DC

## 2020-04-08 MED ORDER — ORAL CARE MOUTH RINSE: 15.0000 mL | Freq: Once | OROMUCOSAL | Status: AC

## 2020-04-08 MED ORDER — POLYETHYLENE GLYCOL 3350 17 G PO PACK: 17.0000 g | PACK | Freq: Two times a day (BID) | ORAL | 0 refills | Status: DC

## 2020-04-08 MED ORDER — CEFAZOLIN SODIUM-DEXTROSE 2-4 GM/100ML-% IV SOLN
2.0000 g | INTRAVENOUS | Status: AC
  Administered 2020-04-08: 2 g via INTRAVENOUS
  Filled 2020-04-08: qty 100

## 2020-04-08 MED ORDER — FENTANYL CITRATE (PF) 100 MCG/2ML IJ SOLN
INTRAMUSCULAR | Status: AC
  Administered 2020-04-08: 50 ug via INTRAVENOUS
  Filled 2020-04-08: qty 2

## 2020-04-08 MED ORDER — PHENYLEPHRINE HCL (PRESSORS) 10 MG/ML IV SOLN
INTRAVENOUS | Status: AC
  Filled 2020-04-08: qty 1

## 2020-04-08 MED ORDER — HYDROCODONE-ACETAMINOPHEN 5-325 MG PO TABS: 1.0000 | ORAL_TABLET | ORAL | Status: DC | PRN

## 2020-04-08 MED ORDER — ONDANSETRON HCL 4 MG PO TABS
4.0000 mg | ORAL_TABLET | Freq: Four times a day (QID) | ORAL | Status: DC | PRN
  Filled 2020-04-08: qty 1

## 2020-04-08 MED ORDER — CEFAZOLIN SODIUM-DEXTROSE 1-4 GM/50ML-% IV SOLN
1.0000 g | Freq: Four times a day (QID) | INTRAVENOUS | Status: DC
  Administered 2020-04-08: 1 g via INTRAVENOUS

## 2020-04-08 MED ORDER — ACETAMINOPHEN 500 MG PO TABS
ORAL_TABLET | ORAL | Status: AC
  Filled 2020-04-08: qty 2

## 2020-04-08 MED ORDER — HYDROCODONE-ACETAMINOPHEN 7.5-325 MG PO TABS: 1.0000 | ORAL_TABLET | ORAL | 0 refills | Status: DC | PRN

## 2020-04-08 MED ORDER — METHOCARBAMOL 500 MG PO TABS: 500.0000 mg | ORAL_TABLET | Freq: Four times a day (QID) | ORAL | 0 refills | Status: DC | PRN

## 2020-04-08 MED ORDER — STERILE WATER FOR IRRIGATION IR SOLN
Status: DC | PRN
  Administered 2020-04-08: 2000 mL

## 2020-04-08 MED ORDER — DOCUSATE SODIUM 100 MG PO CAPS: 100.0000 mg | ORAL_CAPSULE | Freq: Two times a day (BID) | ORAL | 0 refills | Status: DC

## 2020-04-08 MED ORDER — LACTATED RINGERS IV BOLUS
250.0000 mL | Freq: Once | INTRAVENOUS | Status: AC
  Administered 2020-04-08: 250 mL via INTRAVENOUS

## 2020-04-08 MED ORDER — METHOCARBAMOL 500 MG IVPB - SIMPLE MED
INTRAVENOUS | Status: AC
  Filled 2020-04-08: qty 50

## 2020-04-08 SURGICAL SUPPLY — 48 items
ADH SKN CLS APL DERMABOND .7 (GAUZE/BANDAGES/DRESSINGS) ×1
BAG DECANTER FOR FLEXI CONT (MISCELLANEOUS) IMPLANT
BAG SPEC THK2 15X12 ZIP CLS (MISCELLANEOUS)
BAG ZIPLOCK 12X15 (MISCELLANEOUS) IMPLANT
BLADE SAG 18X100X1.27 (BLADE) ×2 IMPLANT
BLADE SURG SZ10 CARB STEEL (BLADE) ×4 IMPLANT
COVER PERINEAL POST (MISCELLANEOUS) ×2 IMPLANT
COVER SURGICAL LIGHT HANDLE (MISCELLANEOUS) ×2 IMPLANT
COVER WAND RF STERILE (DRAPES) IMPLANT
CUP ACET PINNACLE SECTR 56MM (Hips) IMPLANT
DERMABOND ADVANCED (GAUZE/BANDAGES/DRESSINGS) ×1
DERMABOND ADVANCED .7 DNX12 (GAUZE/BANDAGES/DRESSINGS) ×1 IMPLANT
DRAPE STERI IOBAN 125X83 (DRAPES) ×2 IMPLANT
DRAPE U-SHAPE 47X51 STRL (DRAPES) ×4 IMPLANT
DRESSING AQUACEL AG SP 3.5X10 (GAUZE/BANDAGES/DRESSINGS) ×1 IMPLANT
DRSG AQUACEL AG ADV 3.5X10 (GAUZE/BANDAGES/DRESSINGS) ×1 IMPLANT
DRSG AQUACEL AG SP 3.5X10 (GAUZE/BANDAGES/DRESSINGS) ×2
DURAPREP 26ML APPLICATOR (WOUND CARE) ×2 IMPLANT
ELECT REM PT RETURN 15FT ADLT (MISCELLANEOUS) ×2 IMPLANT
ELIMINATOR HOLE APEX DEPUY (Hips) ×1 IMPLANT
GLOVE BIO SURGEON STRL SZ 6 (GLOVE) ×4 IMPLANT
GLOVE BIOGEL PI IND STRL 6.5 (GLOVE) ×1 IMPLANT
GLOVE BIOGEL PI IND STRL 7.5 (GLOVE) ×1 IMPLANT
GLOVE BIOGEL PI IND STRL 8.5 (GLOVE) ×1 IMPLANT
GLOVE BIOGEL PI INDICATOR 6.5 (GLOVE) ×1
GLOVE BIOGEL PI INDICATOR 7.5 (GLOVE) ×1
GLOVE BIOGEL PI INDICATOR 8.5 (GLOVE) ×1
GLOVE ECLIPSE 8.0 STRL XLNG CF (GLOVE) ×4 IMPLANT
GLOVE ORTHO TXT STRL SZ7.5 (GLOVE) ×4 IMPLANT
GOWN STRL REUS W/TWL LRG LVL3 (GOWN DISPOSABLE) ×4 IMPLANT
GOWN STRL REUS W/TWL XL LVL3 (GOWN DISPOSABLE) ×2 IMPLANT
HEAD CERAMIC DELTA 36 PLUS 1.5 (Hips) ×1 IMPLANT
HOLDER FOLEY CATH W/STRAP (MISCELLANEOUS) ×2 IMPLANT
KIT TURNOVER KIT A (KITS) IMPLANT
PACK ANTERIOR HIP CUSTOM (KITS) ×2 IMPLANT
PENCIL SMOKE EVACUATOR (MISCELLANEOUS) IMPLANT
PINNACLE ALTRX PLUS 4 N 36X56 (Hips) ×1 IMPLANT
PINNACLE SECTOR CUP 56MM (Hips) ×2 IMPLANT
SCREW 6.5MMX35MM (Screw) ×1 IMPLANT
STEM FEM ACTIS HIGH SZ10 (Stem) ×1 IMPLANT
SUT MNCRL AB 4-0 PS2 18 (SUTURE) ×2 IMPLANT
SUT STRATAFIX 0 PDS 27 VIOLET (SUTURE) ×2
SUT VIC AB 1 CT1 36 (SUTURE) ×6 IMPLANT
SUT VIC AB 2-0 CT1 27 (SUTURE) ×4
SUT VIC AB 2-0 CT1 TAPERPNT 27 (SUTURE) ×2 IMPLANT
SUTURE STRATFX 0 PDS 27 VIOLET (SUTURE) ×1 IMPLANT
TRAY FOLEY MTR SLVR 16FR STAT (SET/KITS/TRAYS/PACK) IMPLANT
WATER STERILE IRR 1000ML POUR (IV SOLUTION) ×2 IMPLANT

## 2020-04-08 NOTE — Telephone Encounter (Signed)
LVM for patient to call back and schedule appt with Dr. Hunter.  

## 2020-04-08 NOTE — Interval H&P Note (Signed)
History and Physical Interval Note:  04/08/2020 7:13 AM  Terry Wright  has presented today for surgery, with the diagnosis of Left hip osteoarthritis.  The various methods of treatment have been discussed with the patient and family. After consideration of risks, benefits and other options for treatment, the patient has consented to  Procedure(s) with comments: TOTAL HIP ARTHROPLASTY ANTERIOR APPROACH (Left) - 70 mins as a surgical intervention.  The patient's history has been reviewed, patient examined, no change in status, stable for surgery.  I have reviewed the patient's chart and labs.  Questions were answered to the patient's satisfaction.     Mauri Pole

## 2020-04-08 NOTE — Anesthesia Postprocedure Evaluation (Signed)
Anesthesia Post Note  Patient: Terry Wright  Procedure(s) Performed: TOTAL HIP ARTHROPLASTY ANTERIOR APPROACH (Left Hip)     Patient location during evaluation: PACU Anesthesia Type: Spinal Level of consciousness: oriented and awake and alert Pain management: pain level controlled Vital Signs Assessment: post-procedure vital signs reviewed and stable Respiratory status: spontaneous breathing, respiratory function stable and patient connected to nasal cannula oxygen Cardiovascular status: blood pressure returned to baseline and stable Postop Assessment: no headache, no backache and no apparent nausea or vomiting Anesthetic complications: no   No complications documented.  Last Vitals:  Vitals:   04/08/20 1345 04/08/20 1400  BP: (!) 162/136 133/65  Pulse: (!) 58 (!) 58  Resp:  16  Temp:    SpO2: 95% 95%    Last Pain:  Vitals:   04/08/20 1345  TempSrc:   PainSc: 8                  Jolean Madariaga L Terilyn Sano

## 2020-04-08 NOTE — Anesthesia Procedure Notes (Addendum)
Procedure Name: MAC Date/Time: 04/08/2020 9:33 AM Performed by: West Pugh, CRNA Pre-anesthesia Checklist: Patient identified, Emergency Drugs available, Suction available, Patient being monitored and Timeout performed Patient Re-evaluated:Patient Re-evaluated prior to induction Oxygen Delivery Method: Simple face mask Preoxygenation: Pre-oxygenation with 100% oxygen Induction Type: IV induction Placement Confirmation: positive ETCO2 Dental Injury: Teeth and Oropharynx as per pre-operative assessment

## 2020-04-08 NOTE — Anesthesia Procedure Notes (Signed)
Spinal  Patient location during procedure: OR Start time: 04/08/2020 9:38 AM End time: 04/08/2020 9:48 AM Staffing Performed: anesthesiologist  Anesthesiologist: Freddrick March, MD Preanesthetic Checklist Completed: patient identified, IV checked, risks and benefits discussed, surgical consent, monitors and equipment checked, pre-op evaluation and timeout performed Spinal Block Patient position: sitting Prep: DuraPrep and site prepped and draped Patient monitoring: cardiac monitor, continuous pulse ox and blood pressure Approach: midline Location: L3-4 Injection technique: single-shot Needle Needle type: Pencan  Needle gauge: 24 G Needle length: 9 cm Assessment Sensory level: T6 Additional Notes Functioning IV was confirmed and monitors were applied. Sterile prep and drape, including hand hygiene and sterile gloves were used. The patient was positioned and the spine was prepped. The skin was anesthetized with lidocaine.  Free flow of clear CSF was obtained prior to injecting local anesthetic into the CSF.  The spinal needle aspirated freely following injection.  The needle was carefully withdrawn.  The patient tolerated the procedure well.

## 2020-04-08 NOTE — Evaluation (Signed)
Physical Therapy Evaluation Patient Details Name: Terry Wright MRN: [id number] DOB: 04-27-1956 Today's Date: 04/08/2020   History of Present Illness  Pt s/p L THR and with hx of MI, COPD, PVD, and CAD  Clinical Impression  Pt s/p L THR and presents with decreased L LE strength/ROM and post op pain limiting functional mobility.  Pt should progress to dc home with family assist.  This date pt ambulated limited distance in hall, negotiated single step, reviewed car transfers and performed abbreviated HEP program with written instruction provided and reviewed.  Increased time all tasks 2* pain 8/10 - RN providing additional pain meds.    Follow Up Recommendations Follow surgeon's recommendation for DC plan and follow-up therapies    Equipment Recommendations  Rolling walker with 5" wheels;3in1 (PT)    Recommendations for Other Services       Precautions / Restrictions Precautions Precautions: Fall Restrictions Weight Bearing Restrictions: No Other Position/Activity Restrictions: WBAT      Mobility  Bed Mobility Overal bed mobility: Needs Assistance Bed Mobility: Supine to Sit     Supine to sit: Min assist     General bed mobility comments: Increased time with cues for sequence and use of R LE to self assist.    Transfers Overall transfer level: Needs assistance Equipment used: Rolling walker (2 wheeled) Transfers: Sit to/from Stand Sit to Stand: Min guard         General transfer comment: cues for LE management and use of UEs to self assist  Ambulation/Gait Ambulation/Gait assistance: Min guard;Supervision Gait Distance (Feet): 56 Feet Assistive device: Rolling walker (2 wheeled) Gait Pattern/deviations: Step-to pattern;Decreased step length - right;Decreased step length - left;Decreased stance time - right;Shuffle;Trunk flexed Gait velocity: decr   General Gait Details: Increased time with cues for sequence, posture and positiion from  [employer] Stairs: Yes Stairs assistance: Min assist Stair Management: No rails;Step to pattern;Forwards;With walker Number of Stairs: 1 General stair comments: cues for sequence and foot/RW placement  Wheelchair Mobility    Modified Rankin (Stroke Patients Only)       Balance Overall balance assessment: Mild deficits observed, not formally tested                                           Pertinent Vitals/Pain Pain Assessment: 0-10 Pain Score: 8  Pain Location: L hip Pain Descriptors / Indicators: Aching;Grimacing;Guarding;Sore Pain Intervention(s): Limited activity within patient's tolerance;Monitored during session;Premedicated before session;Ice applied;Patient requesting pain meds-RN notified    Home Living Family/patient expects to be discharged to:: Private residence Living Arrangements: Spouse/significant other Available Help at Discharge: Family Type of Home: House Home Access: Stairs to enter Entrance Stairs-Rails: None Entrance Stairs-Number of Steps: 1 Home Layout: One level Home Equipment: None      Prior Function Level of Independence: Independent               Hand Dominance        Extremity/Trunk Assessment   Upper Extremity Assessment Upper Extremity Assessment: Overall WFL for tasks assessed    Lower Extremity Assessment Lower Extremity Assessment: LLE deficits/detail LLE Deficits / Details: 2/5 strength at hip with AAROM at hip to 75 flex and 10 abd    Cervical / Trunk Assessment Cervical / Trunk Assessment: Normal  Communication   Communication: No difficulties  Cognition Arousal/Alertness: Awake/alert Behavior During Therapy: WFL for tasks assessed/performed Overall Cognitive  Status: Within Functional Limits for tasks assessed                                        General Comments      Exercises Total Joint Exercises Ankle Circles/Pumps: AROM;Both;15 reps;Supine Quad Sets:  AROM;Both;5 reps;Supine Heel Slides: AAROM;Left;10 reps;Supine Hip ABduction/ADduction: AAROM;Left;5 reps;Supine Long Arc Quad: AROM;Left;Seated;Other reps (comment) (2 reps)   Assessment/Plan    PT Assessment Patient needs continued PT services  PT Problem List Decreased strength;Decreased range of motion;Decreased activity tolerance;Decreased balance;Decreased mobility;Decreased knowledge of use of DME;Pain;Decreased knowledge of precautions       PT Treatment Interventions DME instruction;Gait training;Stair training;Functional mobility training;Therapeutic activities;Therapeutic exercise;Patient/family education;Balance training    PT Goals (Current goals can be found in the Care Plan section)  Acute Rehab PT Goals Patient Stated Goal: Less pain, regain IND PT Goal Formulation: With patient Time For Goal Achievement: 04/15/20 Potential to Achieve Goals: Good    Frequency 7X/week   Barriers to discharge        Co-evaluation               AM-PAC PT "6 Clicks" Mobility  Outcome Measure Help needed turning from your back to your side while in a flat bed without using bedrails?: A Little Help needed moving from lying on your back to sitting on the side of a flat bed without using bedrails?: A Little Help needed moving to and from a bed to a chair (including a wheelchair)?: A Little Help needed standing up from a chair using your arms (e.g., wheelchair or bedside chair)?: A Little Help needed to walk in hospital room?: A Little Help needed climbing 3-5 steps with a railing? : A Little 6 Click Score: 18    End of Session Equipment Utilized During Treatment: Gait belt Activity Tolerance: Patient tolerated treatment well;Patient limited by pain Patient left: in chair;with call bell/phone within reach;with nursing/sitter in room Nurse Communication: Mobility status PT Visit Diagnosis: Difficulty in walking, not elsewhere classified (R26.2);Pain Pain - Right/Left:  Left Pain - part of body: Hip    Time: 4765-4650 PT Time Calculation (min) (ACUTE ONLY): 49 min   Charges:   PT Evaluation $PT Eval Low Complexity: 1 Low PT Treatments $Gait Training: 8-22 mins $Therapeutic Exercise: 8-22 mins        Debe Coder PT Acute Rehabilitation Services Pager [phone] Office [phone]   Terry Wright 04/08/2020, 4:20 PM

## 2020-04-08 NOTE — Transfer of Care (Signed)
Immediate Anesthesia Transfer of Care Note  Patient: Terry Wright  Procedure(s) Performed: TOTAL HIP ARTHROPLASTY ANTERIOR APPROACH (Left Hip)  Patient Location: PACU  Anesthesia Type:MAC and Spinal  Level of Consciousness: awake, alert  and patient cooperative  Airway & Oxygen Therapy: Patient Spontanous Breathing and Patient connected to face mask oxygen  Post-op Assessment: Report given to RN and Post -op Vital signs reviewed and stable  Post vital signs: Reviewed and stable  Last Vitals:  Vitals Value Taken Time  BP 120/85 04/08/20 1123  Temp    Pulse 59 04/08/20 1125  Resp 14 04/08/20 1125  SpO2 100 % 04/08/20 1125  Vitals shown include unvalidated device data.  Last Pain:  Vitals:   04/08/20 0646  TempSrc: Oral         Complications: No complications documented.

## 2020-04-08 NOTE — Anesthesia Preprocedure Evaluation (Addendum)
Anesthesia Evaluation  Patient identified by MRN, date of birth, ID band Patient awake    Reviewed: Allergy & Precautions, NPO status , Patient's Chart, lab work & pertinent test results, reviewed documented beta blocker date and time   Airway Mallampati: II  TM Distance: >3 FB Neck ROM: Full    Dental no notable dental hx. (+) Teeth Intact, Dental Advisory Given   Pulmonary COPD,  COPD inhaler, former smoker,    Pulmonary exam normal breath sounds clear to auscultation       Cardiovascular hypertension, Pt. on home beta blockers and Pt. on medications + angina + CAD, + Past MI, + Cardiac Stents (a) 100% pCx -- DES PCI with 4.0 x 16 mm Synergy DES. 65% ostial OM 3 (relatively small caliber - Med Rx);; b) 03/27/18 PCI/DES to dLCX/OM3 x1. Patent pCX DES. 700% dRCA trifurcation lesion (Med Rx). Normal EF), + Peripheral Vascular Disease and + DVT  Normal cardiovascular exam Rhythm:Regular Rate:Normal  TTE 2018 - Left ventricle: Mild lateral hypokinesis. The cavity size was  normal. Wall thickness was normal. Systolic function was normal. The estimated ejection fraction was in the range of 50% to 55%. Doppler parameters are consistent with abnormal left ventricular relaxation (grade 1 diastolic dysfunction).  - Left atrium: The atrium was mildly to moderately dilated.  - Right atrium: The atrium was mildly dilated.  - valves ok  LHC 2019 1. 2 vessel obstructive CAD     - 95% distal LCx/OM3     -70% distal RCA at trifurcation 2. Widely patent stent in the proximal LCx 3. Low normal LV function 4. Normal LVEDP 5. Successful PCI of the distal LCx/OM3 with DES    Neuro/Psych PSYCHIATRIC DISORDERS Anxiety negative neurological ROS     GI/Hepatic Neg liver ROS, GERD  Medicated and Controlled,  Endo/Other  negative endocrine ROS  Renal/GU negative Renal ROS  negative genitourinary   Musculoskeletal  (+) Arthritis ,  Osteoarthritis,    Abdominal   Peds  Hematology  (+) Blood dyscrasia (on plavix, last dose 9/1), ,   Anesthesia Other Findings   Reproductive/Obstetrics                            Anesthesia Physical Anesthesia Plan  ASA: III  Anesthesia Plan: Spinal   Post-op Pain Management:    Induction:   PONV Risk Score and Plan: 2 and Treatment may vary due to age or medical condition, Propofol infusion and Midazolam  Airway Management Planned: Natural Airway  Additional Equipment:   Intra-op Plan:   Post-operative Plan:   Informed Consent: I have reviewed the patients History and Physical, chart, labs and discussed the procedure including the risks, benefits and alternatives for the proposed anesthesia with the patient or authorized representative who has indicated his/her understanding and acceptance.     Dental advisory given  Plan Discussed with: CRNA  Anesthesia Plan Comments:         Anesthesia Quick Evaluation

## 2020-04-08 NOTE — Discharge Instructions (Addendum)
INSTRUCTIONS AFTER JOINT REPLACEMENT   o Remove items at home which could result in a fall. This includes throw rugs or furniture in walking pathways o ICE to the affected joint every three hours while awake for 30 minutes at a time, for at least the first 3-5 days, and then as needed for pain and swelling.  Continue to use ice for pain and swelling. You may notice swelling that will progress down to the foot and ankle.  This is normal after surgery.  Elevate your leg when you are not up walking on it.   o Continue to use the breathing machine you got in the hospital (incentive spirometer) which will help keep your temperature down.  It is common for your temperature to cycle up and down following surgery, especially at night when you are not up moving around and exerting yourself.  The breathing machine keeps your lungs expanded and your temperature down.   DIET:  As you were doing prior to hospitalization, we recommend a well-balanced diet.  DRESSING / WOUND CARE / SHOWERING  Keep the surgical dressing until follow up.  The dressing is water proof, so you can shower without any extra covering.  IF THE DRESSING FALLS OFF or the wound gets wet inside, change the dressing with sterile gauze.  Please use Hailly Fess hand washing techniques before changing the dressing.  Do not use any lotions or creams on the incision until instructed by your surgeon.    ACTIVITY  o Increase activity slowly as tolerated, but follow the weight bearing instructions below.   o No driving for 6 weeks or until further direction given by your physician.  You cannot drive while taking narcotics.  o No lifting or carrying greater than 10 lbs. until further directed by your surgeon. o Avoid periods of inactivity such as sitting longer than an hour when not asleep. This helps prevent blood clots.  o You may return to work once you are authorized by your doctor.     WEIGHT BEARING   Weight bearing as tolerated with assist  device (walker, cane, etc) as directed, use it as long as suggested by your surgeon or therapist, typically at least 4-6 weeks.   EXERCISES  Results after joint replacement surgery are often greatly improved when you follow the exercise, range of motion and muscle strengthening exercises prescribed by your doctor. Safety measures are also important to protect the joint from further injury. Any time any of these exercises cause you to have increased pain or swelling, decrease what you are doing until you are comfortable again and then slowly increase them. If you have problems or questions, call your caregiver or physical therapist for advice.   Rehabilitation is important following a joint replacement. After just a few days of immobilization, the muscles of the leg can become weakened and shrink (atrophy).  These exercises are designed to build up the tone and strength of the thigh and leg muscles and to improve motion. Often times heat used for twenty to thirty minutes before working out will loosen up your tissues and help with improving the range of motion but do not use heat for the first two weeks following surgery (sometimes heat can increase post-operative swelling).   These exercises can be done on a training (exercise) mat, on the floor, on a table or on a bed. Use whatever works the best and is most comfortable for you.    Use music or television while you are exercising so that   the exercises are a pleasant break in your day. This will make your life better with the exercises acting as a break in your routine that you can look forward to.   Perform all exercises about fifteen times, three times per day or as directed.  You should exercise both the operative leg and the other leg as well.  Exercises include:   . Quad Sets - Tighten up the muscle on the front of the thigh (Quad) and hold for 5-10 seconds.   . Straight Leg Raises - With your knee straight (if you were given a brace, keep it on),  lift the leg to 60 degrees, hold for 3 seconds, and slowly lower the leg.  Perform this exercise against resistance later as your leg gets stronger.  . Leg Slides: Lying on your back, slowly slide your foot toward your buttocks, bending your knee up off the floor (only go as far as is comfortable). Then slowly slide your foot back down until your leg is flat on the floor again.  . Angel Wings: Lying on your back spread your legs to the side as far apart as you can without causing discomfort.  . Hamstring Strength:  Lying on your back, push your heel against the floor with your leg straight by tightening up the muscles of your buttocks.  Repeat, but this time bend your knee to a comfortable angle, and push your heel against the floor.  You may put a pillow under the heel to make it more comfortable if necessary.   A rehabilitation program following joint replacement surgery can speed recovery and prevent re-injury in the future due to weakened muscles. Contact your doctor or a physical therapist for more information on knee rehabilitation.    CONSTIPATION  Constipation is defined medically as fewer than three stools per week and severe constipation as less than one stool per week.  Even if you have a regular bowel pattern at home, your normal regimen is likely to be disrupted due to multiple reasons following surgery.  Combination of anesthesia, postoperative narcotics, change in appetite and fluid intake all can affect your bowels.   YOU MUST use at least one of the following options; they are listed in order of increasing strength to get the job done.  They are all available over the counter, and you may need to use some, POSSIBLY even all of these options:    Drink plenty of fluids (prune juice may be helpful) and high fiber foods Colace 100 mg by mouth twice a day  Senokot for constipation as directed and as needed Dulcolax (bisacodyl), take with full glass of water  Miralax (polyethylene glycol)  once or twice a day as needed.  If you have tried all these things and are unable to have a bowel movement in the first 3-4 days after surgery call either your surgeon or your primary doctor.    If you experience loose stools or diarrhea, hold the medications until you stool forms back up.  If your symptoms do not get better within 1 week or if they get worse, check with your doctor.  If you experience "the worst abdominal pain ever" or develop nausea or vomiting, please contact the office immediately for further recommendations for treatment.   ITCHING:  If you experience itching with your medications, try taking only a single pain pill, or even half a pain pill at a time.  You can also use Benadryl over the counter for itching or also to   help with sleep.   TED HOSE STOCKINGS:  Use stockings on both legs until for at least 2 weeks or as directed by physician office. They may be removed at night for sleeping.  MEDICATIONS:  See your medication summary on the "After Visit Summary" that nursing will review with you.  You may have some home medications which will be placed on hold until you complete the course of blood thinner medication.  It is important for you to complete the blood thinner medication as prescribed.  PRECAUTIONS:  If you experience chest pain or shortness of breath - call 911 immediately for transfer to the hospital emergency department.   If you develop a fever greater that 101 F, purulent drainage from wound, increased redness or drainage from wound, foul odor from the wound/dressing, or calf pain - CONTACT YOUR SURGEON.                                                   FOLLOW-UP APPOINTMENTS:  If you do not already have a post-op appointment, please call the office for an appointment to be seen by your surgeon.  Guidelines for how soon to be seen are listed in your "After Visit Summary", but are typically between 1-4 weeks after surgery.  OTHER INSTRUCTIONS:   Knee  Replacement:  Do not place pillow under knee, focus on keeping the knee straight while resting.    DENTAL ANTIBIOTICS:  In most cases prophylactic antibiotics for Dental procdeures after total joint surgery are not necessary.  Exceptions are as follows:  1. History of prior total joint infection  2. Severely immunocompromised (Organ Transplant, cancer chemotherapy, Rheumatoid biologic meds such as [place])  3. Poorly controlled diabetes (A1C &gt; 8.0, blood glucose over 200)  If you have one of these conditions, contact your surgeon for an antibiotic prescription, prior to your dental procedure.   MAKE SURE YOU:  . Understand these instructions.  . Get help right away if you are not doing well or get worse.    Thank you for letting us be a part of your medical care team.  It is a privilege we respect greatly.  We hope these instructions will help you stay on track for a fast and full recovery!     General Anesthesia, Adult, Care After This sheet gives you information about how to care for yourself after your procedure. Your health care provider may also give you more specific instructions. If you have problems or questions, contact your health care provider. What can I expect after the procedure? After the procedure, the following side effects are common:  Pain or discomfort at the IV site.  Nausea.  Vomiting.  Sore throat.  Trouble concentrating.  Feeling cold or chills.  Weak or tired.  Sleepiness and fatigue.  Soreness and body aches. These side effects can affect parts of the body that were not involved in surgery. Follow these instructions at home:  For at least 24 hours after the procedure:  Have a responsible adult stay with you. It is important to have someone help care for you until you are awake and alert.  Rest as needed.  Do not: ? Participate in activities in which you could fall or become injured. ? Drive. ? Use heavy machinery. ? Drink  alcohol. ? Take sleeping pills or medicines that cause drowsiness. ?  Make important decisions or sign legal documents. ? Take care of children on your own. Eating and drinking  Follow any instructions from your health care provider about eating or drinking restrictions.  When you feel hungry, start by eating small amounts of foods that are soft and easy to digest (bland), such as toast. Gradually return to your regular diet.  Drink enough fluid to keep your urine pale yellow.  If you vomit, rehydrate by drinking water, juice, or clear broth. General instructions  If you have sleep apnea, surgery and certain medicines can increase your risk for breathing problems. Follow instructions from your health care provider about wearing your sleep device: ? Anytime you are sleeping, including during daytime naps. ? While taking prescription pain medicines, sleeping medicines, or medicines that make you drowsy.  Return to your normal activities as told by your health care provider. Ask your health care provider what activities are safe for you.  Take over-the-counter and prescription medicines only as told by your health care provider.  If you smoke, do not smoke without supervision.  Keep all follow-up visits as told by your health care provider. This is important. Contact a health care provider if:  You have nausea or vomiting that does not get better with medicine.  You cannot eat or drink without vomiting.  You have pain that does not get better with medicine.  You are unable to pass urine.  You develop a skin rash.  You have a fever.  You have redness around your IV site that gets worse. Get help right away if:  You have difficulty breathing.  You have chest pain.  You have blood in your urine or stool, or you vomit blood. Summary  After the procedure, it is common to have a sore throat or nausea. It is also common to feel tired.  Have a responsible adult stay with you  for the first 24 hours after general anesthesia. It is important to have someone help care for you until you are awake and alert.  When you feel hungry, start by eating small amounts of foods that are soft and easy to digest (bland), such as toast. Gradually return to your regular diet.  Drink enough fluid to keep your urine pale yellow.  Return to your normal activities as told by your health care provider. Ask your health care provider what activities are safe for you. This information is not intended to replace advice given to you by your health care provider. Make sure you discuss any questions you have with your health care provider. Document Revised: 07/20/2017 Document Reviewed: 03/02/2017 Elsevier Patient Education  [place].

## 2020-04-08 NOTE — Op Note (Signed)
NAME:  Terry Wright                ACCOUNT NO.: [id number]      MEDICAL RECORD NO.: [id number]      FACILITY:  [facility]      PHYSICIAN:  Mauri Pole  DATE OF BIRTH:  September 17, 1955     DATE OF PROCEDURE:  04/08/2020                                 OPERATIVE REPORT         PREOPERATIVE DIAGNOSIS: Left  hip osteoarthritis.      POSTOPERATIVE DIAGNOSIS:  Left hip osteoarthritis.      PROCEDURE:  Left total hip replacement through an anterior approach   utilizing DePuy THR system, component size 56 mm pinnacle cup, a size 36+4 neutral   Altrex liner, a size 10 Hi Actis stem with a 36+1.5 delta ceramic   ball.      SURGEON:  Pietro Cassis. Alvan Dame, M.D.      ASSISTANT:  Danae Orleans, PA-C     ANESTHESIA:  Spinal.      SPECIMENS:  None.      COMPLICATIONS:  None.      BLOOD LOSS:  600 cc     DRAINS:  None.      INDICATION OF THE PROCEDURE:  Terry Wright is a 64 y.o. male who had   presented to office for evaluation of left hip pain.  Radiographs revealed   progressive degenerative changes with bone-on-bone   articulation of the  hip joint, including subchondral cystic changes and osteophytes.  The patient had painful limited range of   motion significantly affecting their overall quality of life and function.  The patient was failing to    respond to conservative measures including medications and/or injections and activity modification and at this point was ready   to proceed with more definitive measures.  Consent was obtained for   benefit of pain relief.  Specific risks of infection, DVT, component   failure, dislocation, neurovascular injury, and need for revision surgery were reviewed in the office as well discussion of   the anterior versus posterior approach were reviewed.     PROCEDURE IN DETAIL:  The patient was brought to operative theater.   Once adequate anesthesia, preoperative antibiotics, 2 gm of Ancef, 1 gm of Tranexamic  Acid, and 10 mg of Decadron were administered, the patient was positioned supine on the [employer] table.  Once the patient was safely positioned with adequate padding of boney prominences we predraped out the hip, and used fluoroscopy to confirm orientation of the pelvis.      The left hip was then prepped and draped from proximal iliac crest to   mid thigh with a shower curtain technique.      Time-out was performed identifying the patient, planned procedure, and the appropriate extremity.     An incision was then made 2 cm lateral to the   anterior superior iliac spine extending over the orientation of the   tensor fascia lata muscle and sharp dissection was carried down to the   fascia of the muscle.      The fascia was then incised.  The muscle belly was identified and swept   laterally and retractor placed along the superior neck.  Following   cauterization of the circumflex vessels and removing some  pericapsular   fat, a second cobra retractor was placed on the inferior neck.  A T-capsulotomy was made along the line of the   superior neck to the trochanteric fossa, then extended proximally and   distally.  Tag sutures were placed and the retractors were then placed   intracapsular.  We then identified the trochanteric fossa and   orientation of my neck cut and then made a neck osteotomy with the femur on traction.  The femoral   head was removed without difficulty or complication.  Traction was let   off and retractors were placed posterior and anterior around the   acetabulum.      The labrum and foveal tissue were debrided.  I began reaming with a 46 mm   reamer and reamed up to 55 mm reamer with good bony bed preparation and a 56 mm  cup was chosen.  The final 56 mm Pinnacle cup was then impacted under fluoroscopy to confirm the depth of penetration and orientation with respect to   Abduction and forward flexion.  A screw was placed into the ilium followed by the hole eliminator.   The final   36+4 neutral Altrex liner was impacted with good visualized rim fit.  The cup was positioned anatomically within the acetabular portion of the pelvis.      At this point, the femur was rolled to 100 degrees.  Further capsule was   released off the inferior aspect of the femoral neck.  I then   released the superior capsule proximally.  With the leg in a neutral position the hook was placed laterally   along the femur under the vastus lateralis origin and elevated manually and then held in position using the hook attachment on the bed.  The leg was then extended and adducted with the leg rolled to 100   degrees of external rotation.  Retractors were placed along the medial calcar and posteriorly over the greater trochanter.  Once the proximal femur was fully   exposed, I used a box osteotome to set orientation.  I then began   broaching with the starting chili pepper broach and passed this by hand and then broached up to 10.  With the 10 broach in place I chose a high offset neck and did several trial reductions.  The offset was appropriate, leg lengths   appeared to be equal best matched with the +1.5 head ball trial confirmed radiographically.   Given these findings, I went ahead and dislocated the hip, repositioned all   retractors and positioned the right hip in the extended and abducted position.  The final 10 Hi Actis stem was   chosen and it was impacted down to the level of neck cut.  Based on this   and the trial reductions, a final 36+1.5 delta ceramic ball was chosen and   impacted onto a clean and dry trunnion, and the hip was reduced.  The   hip had been irrigated throughout the case again at this point.  I did   reapproximate the superior capsular leaflet to the anterior leaflet   using #1 Vicryl.  The fascia of the   tensor fascia lata muscle was then reapproximated using #1 Vicryl and #0 Stratafix sutures.  The   remaining wound was closed with 2-0 Vicryl and running  4-0 Monocryl.   The hip was cleaned, dried, and dressed sterilely using Dermabond and   Aquacel dressing.  The patient was then brought   to  recovery room in stable condition tolerating the procedure well.    Danae Orleans, PA-C was present for the entirety of the case involved from   preoperative positioning, perioperative retractor management, general   facilitation of the case, as well as primary wound closure as assistant.            Pietro Cassis Alvan Dame, M.D.        04/08/2020 8:53 AM

## 2020-04-09 ENCOUNTER — Telehealth: Payer: Self-pay | Admitting: Family Medicine

## 2020-04-09 ENCOUNTER — Encounter (HOSPITAL_COMMUNITY): Payer: Self-pay | Admitting: Orthopedic Surgery

## 2020-04-09 ENCOUNTER — Telehealth: Payer: Self-pay

## 2020-04-09 NOTE — Telephone Encounter (Signed)
Error

## 2020-04-09 NOTE — Telephone Encounter (Signed)
[employer] is calling in stating they received a request for a wheelchair, they are needing authorization for this and needing the NPI, PIC/CPT code for the equipment.

## 2020-04-09 NOTE — Telephone Encounter (Signed)
Patient is calling in stating that they have never heard anything back about this so did notify of what the message was form dr.hunter, did schedule an appointment but wife called back back to cancel stating she called insurance company and they told her that she does not need an appointment so they will put orders in for home health.

## 2020-04-09 NOTE — Telephone Encounter (Signed)
Request that was submitted for a wheelchair needs to be submitted by dme provider

## 2020-04-09 NOTE — Telephone Encounter (Signed)
Terry Wright called in stating that if someone is wanting clarification on if the patient needs an OV before getting these orders to give the Authorization Custodial number a call, but he is eligible for assistance. Wanting to know if someone could give her a call back to go over what exactly is needed for this.

## 2020-04-12 ENCOUNTER — Telehealth: Payer: PPO | Admitting: Family Medicine

## 2020-04-12 DIAGNOSIS — Z96642 Presence of left artificial hip joint: Secondary | ICD-10-CM | POA: Diagnosis not present

## 2020-04-12 DIAGNOSIS — Z471 Aftercare following joint replacement surgery: Secondary | ICD-10-CM | POA: Diagnosis not present

## 2020-04-12 NOTE — Telephone Encounter (Signed)
Paperwork has been placed in [employer] folder for review.

## 2020-04-12 NOTE — Telephone Encounter (Signed)
Paperwork has been placed in Terry Wright's folder to review and sign

## 2020-04-12 NOTE — Telephone Encounter (Signed)
[place] can you please call and provide this information to [facility] please.

## 2020-04-14 NOTE — Telephone Encounter (Signed)
Spoke with Wyatt Portela from [facility] and she stated that she was unable to pull up the request for a wheelchair. She was only showing one for a walker that was submitted at the end of august which was approved.

## 2020-04-26 ENCOUNTER — Ambulatory Visit: Payer: 59 | Admitting: Cardiology

## 2020-05-20 DIAGNOSIS — Z471 Aftercare following joint replacement surgery: Secondary | ICD-10-CM | POA: Diagnosis not present

## 2020-05-20 DIAGNOSIS — Z96642 Presence of left artificial hip joint: Secondary | ICD-10-CM | POA: Diagnosis not present

## 2020-05-27 ENCOUNTER — Other Ambulatory Visit: Payer: Self-pay

## 2020-05-27 ENCOUNTER — Ambulatory Visit: Payer: PPO | Admitting: Cardiology

## 2020-05-27 ENCOUNTER — Encounter: Payer: Self-pay | Admitting: Cardiology

## 2020-05-27 VITALS — BP 128/70 | HR 71 | Ht 73.0 in | Wt 211.0 lb

## 2020-05-27 DIAGNOSIS — Z9861 Coronary angioplasty status: Secondary | ICD-10-CM | POA: Diagnosis not present

## 2020-05-27 DIAGNOSIS — I700 Atherosclerosis of aorta: Secondary | ICD-10-CM

## 2020-05-27 DIAGNOSIS — Z96642 Presence of left artificial hip joint: Secondary | ICD-10-CM

## 2020-05-27 DIAGNOSIS — I2510 Atherosclerotic heart disease of native coronary artery without angina pectoris: Secondary | ICD-10-CM | POA: Diagnosis not present

## 2020-05-27 DIAGNOSIS — I498 Other specified cardiac arrhythmias: Secondary | ICD-10-CM

## 2020-05-27 DIAGNOSIS — E785 Hyperlipidemia, unspecified: Secondary | ICD-10-CM | POA: Diagnosis not present

## 2020-05-27 DIAGNOSIS — I10 Essential (primary) hypertension: Secondary | ICD-10-CM | POA: Diagnosis not present

## 2020-05-27 NOTE — Progress Notes (Signed)
Primary Care Provider: Marin Olp, MD Cardiologist: [name], MD Electrophysiologist: None  Clinic Note: Chief Complaint  Patient presents with  . Follow-up    84-month  . Coronary Artery Disease    No further angina.    HPI:    Terry Wright is a 64 y.o. male with a cardiac history noted below who presents today for 72-month-postop follow-up.  Cardiac History:  Inferior-posterior STEMI/VT arrest 08/06/2016--> Shock x 2 for VT - ROSC -> CATH - 100% pCx - DES PCI (Synergy DES 4.0 x 16 - 4.6 mm) ? Initial EF by LV gram was 35-40% increase to 40 and 45% by echo ? 2-D echo 01/11/2017: EF 50-55%. GR 1 DD. Mild to moderate LA dilation.  Unstable Angina - Cath 03/27/2018: patent stent --> Cx-OM2 95% ([facility]) -- DES PCI. ~70% dRCA trifurcation (med Rx).  Problem List Items Addressed This Visit    CAD S/P percutaneous coronary angioplasty - Primary (Chronic)   Dyslipidemia, goal LDL below 50 (Chronic)   Essential hypertension (Chronic)   S/P left THA, AA      Terry Wright was last seen on October 24, 2019 as a follow-up from her January visit with Terry Memos, NP.  He had intermittent chest pain 11-12 the January.  Never made to the emergency room.  He took at least one nitroglycerin.  He had been active walking 30 minutes a day working in the gym with no chest pain. -> Amlodipine 2.5 mg bid added by Evelena Peat. --> [Feeling better.  No further chest pain.  Exercising again.  Musculoskeletal chest pain off and on.  Was noted by hip pain.  Dizziness with standing and bradycardia.  Chronic PTSD.  Recent Hospitalizations:   04/08/2020: LEFT HIP ANTERIOR ARTHROPLASTY  Reviewed  CV studies:    The following studies were reviewed today: (if available, images/films reviewed: From Epic Chart or Care Everywhere) . None:   Interval History:   Terry Wright " Terry Wright" is here today overall doing quite well.  He is healing up pretty well from his  hip surgery, just is pretty sore.  Trying to regain his strength back and regain his level of activity.  Please been doing mild stuff around the house and walking.  Not yet able to do the amount of walking he was doing before.  He remains completely stable from a cardiac standpoint with no anginal chest pain or pressure with rest or exertion.  No PND orthopnea edema.  No rapid heartbeats palpitations.  No syncope or near syncope, no TIA or amaurosis fugax. He says hip surgery went well with just he is feeling the effects of pain a little older recovering.  The patient does not have symptoms concerning for COVID-19 infection (fever, chills, cough, or new shortness of breath).   REVIEWED OF SYSTEMS   Review of Systems  Constitutional: Negative for malaise/fatigue (Just not as much get up and go.  Hoping that once his hip is fully healed, he will be able to get my energy.) and weight loss (Is postoperatively, his weight loss trend has plateaued.).  HENT: Negative for congestion and nosebleeds.   Respiratory: Negative for cough and shortness of breath.   Gastrointestinal: Negative for abdominal pain, blood in stool and melena.  Genitourinary: Negative for hematuria.  Musculoskeletal: Positive for joint pain (Hip still hurting, now the other hip and knees are still bothering him.). Negative for falls and myalgias.  Neurological: Positive for dizziness (With rapid standing) and headaches. Negative  for focal weakness and weakness.  Psychiatric/Behavioral: Negative for depression and memory loss. The patient is nervous/anxious (No recent panic attacks.) and has insomnia (Has a hard time getting back to sleep ).     I have reviewed and (if needed) personally updated the patient's problem list, medications, allergies, past medical and surgical history, social and family history.   PAST MEDICAL HISTORY   Past Medical History:  Diagnosis Date  . Anginal pain ([place]) 2018  . Anxiety   . Arthritis     shoulders hips,   . CAD S/P percutaneous coronary angioplasty 07/2016, 02/2018   a) 100% pCx -- DES PCI with 4.0 x 16 mm Synergy DES. 65% ostial OM 3 (relatively small caliber - Med Rx);; b) 03/27/18 PCI/DES to dLCX/OM3 x1. Patent pCX DES. 70% dRCA trifurcation lesion (Med Rx). Normal EF  . COPD (chronic obstructive pulmonary disease) ([place]) 2018   mild  . DVT (deep venous thrombosis) (HCC)    Right upper arm DVT on 2 occasions in the remote past  . Dysrhythmia    V tac  . Ejection fraction    LV function normal, echo, February, 2010  . GERD (gastroesophageal reflux disease)   . Hyperlipidemia   . Hypertension   . Mitral regurgitation    Mild, echo, February, 2010  . Palpitations    Mild in the past  . Peripheral vascular disease ([place]) 1980s   DVT  . Pre-diabetes   . Statin intolerance    Felt poorly after Lipitor, Crestor, TriCor  . STEMI involving left circumflex coronary artery ([place]) 08/04/2016   Occluded very large caliber codominant circumflex - PCI with single Synergy DES  . Tobacco abuse     PAST SURGICAL HISTORY   Past Surgical History:  Procedure Laterality Date  . CARDIAC CATHETERIZATION N/A 08/04/2016   Procedure: Left Heart Cath and Coronary Angiography;  Surgeon: Leonie Man, MD;  Location: [facility];  Service: Cardiovascular;  Laterality: N/A: 100% pCx --> PCI. residual 65% pOM3 (small).  ~EF 35-45%  with lateral HK.  Marland Kitchen CARDIAC CATHETERIZATION N/A 08/04/2016   Procedure: CORONARY STENT INTERVENTION;  Surgeon: Leonie Man, MD;  Location: [facility];  Service: Cardiovascular;  Laterality: N/A: pCx 100%-0%: Synergy DES 4.0 x 16 (4.6 mm)  . CHONDROPLASTY Right 04/04/2017   Procedure: CHONDROPLASTY;  Surgeon: Dorna Leitz, MD;  Location: [facility];  Service: Orthopedics;  Laterality: Right;  . CORONARY STENT INTERVENTION N/A 03/27/2018   Procedure: CORONARY STENT INTERVENTION;  Surgeon: Martinique, Peter M, MD;  Location: [facility];  Service:  Cardiovascular:: dCx-OM3 95%-0%: DES PCI - Synergy 2.25 x 16 mm.  Marland Kitchen GANGLION CYST EXCISION     oct 2017  . HERNIA REPAIR     umbilical hernia 2017 oct  . KNEE ARTHROSCOPY Right 04/04/2017   Procedure: ARTHROSCOPY RIGHT KNEE, WITH MEIDAL FEMORAL CONDYLE PATELLAR MEDIAL FEMORAL JOINT PLICA EXCISION ;  Surgeon: Dorna Leitz, MD;  Location: [facility];  Service: Orthopedics;  Laterality: Right;  . LEFT HEART CATH AND CORONARY ANGIOGRAPHY N/A 03/27/2018   Procedure: LEFT HEART CATH AND CORONARY ANGIOGRAPHY;  Surgeon: Martinique, Peter M, MD;  Location: [facility];  Service: Cardiovascular:   2 vessel obstructive CAD: - 95% dLCx/OM3 --Successful PCI of the dLCx/OM3 with SYNERGY DES 2.25X16, -70% distal RCA at trifurcation (Med Management).  Widely patent pLCx DES. Low normal LV function (50-55%), Normal LVEDP   . TOTAL HIP ARTHROPLASTY Left 04/08/2020   Procedure: TOTAL HIP  ARTHROPLASTY ANTERIOR APPROACH;  Surgeon: Paralee Cancel, MD;  Location: [facility];  Service: Orthopedics;  Laterality: Left;  70 mins  . TRANSTHORACIC ECHOCARDIOGRAM  08/05/2016   post STEMI: EF 40-45%. Mild concentric LVH. Severe HK of basal-mid inferolateral wall consistent with infarct in this distribution. GR 1 DD  . TRANSTHORACIC ECHOCARDIOGRAM  01/11/2017   EF 50-55%. GR 1 DD. Mild to moderate LA dilation.    Immunization History  Administered Date(s) Administered  . Influenza,inj,Quad PF,6+ Mos 04/28/2019  . Influenza-Unspecified 05/17/2016, 05/02/2017, 04/15/2018, 04/28/2019  . PFIZER SARS-COV-2 Vaccination 08/19/2019, 09/09/2019, 05/10/2020  . Tdap 10/28/2015  . Zoster Recombinat (Shingrix) 09/17/2017, 12/23/2017    MEDICATIONS/ALLERGIES   Current Meds  Medication Sig  . amLODipine (NORVASC) 2.5 MG tablet Take 1 tablet (2.5 mg total) by mouth daily.  . carvedilol (COREG) 12.5 MG tablet Take 1 tablet (12.5 mg total) by mouth 2 (two) times daily.  . clopidogrel (PLAVIX) 75 MG tablet Take 1 tablet (75 mg total) by mouth  daily.  Marland Kitchen docusate sodium (COLACE) 100 MG capsule Take 1 capsule (100 mg total) by mouth 2 (two) times daily.  . ferrous sulfate (FERROUSUL) 325 (65 FE) MG tablet Take 1 tablet (325 mg total) by mouth 3 (three) times daily with meals for 14 days.  Marland Kitchen HYDROcodone-acetaminophen (NORCO) 7.5-325 MG tablet Take 1-2 tablets by mouth every 4 (four) hours as needed for moderate pain.  Marland Kitchen LORazepam (ATIVAN) 0.5 MG tablet Take 1 tablet (0.5 mg total) by mouth at bedtime as needed for anxiety.  . methocarbamol (ROBAXIN) 500 MG tablet Take 1 tablet (500 mg total) by mouth every 6 (six) hours as needed for muscle spasms.  . nitroGLYCERIN (NITROSTAT) 0.4 MG SL tablet Place 1 tablet (0.4 mg total) under the tongue every 5 (five) minutes as needed for chest pain.  . pantoprazole (PROTONIX) 40 MG tablet Take 1 tablet (40 mg total) by mouth daily.  . polyethylene glycol (MIRALAX / GLYCOLAX) 17 g packet Take 17 g by mouth 2 (two) times daily.  . rosuvastatin (CRESTOR) 20 MG tablet Take 1 tablet (20 mg total) by mouth daily.    Allergies  Allergen Reactions  . Metformin And Related     After starting metformin- Severe hip pain bilaterally worse on left. Could not turn neck. Back pain. Symptoms drastically improved off metformin    SOCIAL HISTORY/FAMILY HISTORY   Reviewed in Epic:  Pertinent findings: No change  OBJCTIVE -PE, EKG, labs   Wt Readings from Last 3 Encounters:  05/27/20 211 lb (95.7 kg)  03/30/20 206 lb (93.4 kg)  03/15/20 207 lb (93.9 kg)    Physical Exam: BP 128/70   Pulse 71   Ht 6\' 1"  (1.854 m)   Wt 211 lb (95.7 kg)   SpO2 95%   BMI 27.84 kg/m  Physical Exam Constitutional:      General: He is not in acute distress.    Appearance: Normal appearance. He is normal weight. He is not ill-appearing or toxic-appearing.     Comments: Healthy-appearing.  Well-groomed.  Neck:     Vascular: No carotid bruit, hepatojugular reflux or JVD.  Cardiovascular:     Rate and Rhythm: Normal rate  and regular rhythm.     Pulses: Normal pulses.     Heart sounds: Murmur (1/6 SEM at RUSB.) heard.  No friction rub. No gallop.   Pulmonary:     Effort: Pulmonary effort is normal. No respiratory distress.     Breath sounds: Normal breath sounds.  Chest:     Chest wall: No tenderness.  Musculoskeletal:        General: No swelling.     Cervical back: Normal range of motion.  Neurological:     General: No focal deficit present.     Mental Status: He is alert and oriented to person, place, and time. Mental status is at baseline.  Psychiatric:        Mood and Affect: Mood normal.        Behavior: Behavior normal.        Thought Content: Thought content normal.        Judgment: Judgment normal.     Adult ECG Report n/a  Recent Labs:    Lab Results  Component Value Date   CHOL 144 03/15/2020   HDL 38 (L) 03/15/2020   LDLCALC 67 03/15/2020   LDLDIRECT 159.1 06/05/2011   TRIG 326 (H) 03/15/2020   CHOLHDL 3.8 03/15/2020   Lab Results  Component Value Date   CREATININE 0.92 03/30/2020   BUN 22 03/30/2020   NA 141 03/30/2020   K 4.2 03/30/2020   CL 108 03/30/2020   CO2 26 03/30/2020   No results found for: TSH  ASSESSMENT/PLAN    Problem List Items Addressed This Visit    CAD S/P percutaneous coronary angioplasty - Primary (Chronic)    Now has 2 stents in the circumflex system, not overlapping.  No further anginal symptoms.  Minimal disease elsewhere.  Echo improved.  We are treating the distal RCA lesions medically and he is doing well with no active symptoms.  Plan: Continue current medications  On amlodipine and carvedilol for blood pressure/antianginal benefit.  On stable dose of rosuvastatin.  Tolerating well.  On maintenance dose clopidogrel.  Okay to hold clopidogrel/Plavix 5 days preop for any surgeries or procedures.  If possible, while holding Plavix, replace with 81 mg ASA until restarted pos-op.       Dyslipidemia, goal LDL below 50 (Chronic)     Labs as of August showed LDL now less than 70.  Stable.  On 20 mg of rosuvastatin tolerating well.  No myalgias or memory issues.      Essential hypertension (Chronic)    Blood pressure well controlled on amlodipine and carvedilol.  No change in dosing.      Ventricular bigeminy (Chronic)    Symptoms well controlled on current dose of beta-blocker.  Was also probably likely related to ischemia prior to PCI.      Aortic atherosclerosis (HCC) (Chronic)    Noted on CT scan.  Not unexpected.  No signs of aneurysm or ulcerative lesions.  Plan: Continue with Guideline Directed Medical Therapy for CAD.      S/P left THA, AA      COVID-19 Education: The signs and symptoms of COVID-19 were discussed with the patient and how to seek care for testing (follow up with PCP or arrange E-visit).   The importance of social distancing and COVID-19 vaccination was discussed today. The patient is practicing social distancing & Masking.   I spent a total of 34minutes with the patient spent in direct patient consultation.  Additional time spent with chart review  / charting (studies, outside notes, etc): 8 Total Time: 35 min   Current medicines are reviewed at length with the patient today.  (+/- concerns) n/a  This visit occurred during the SARS-CoV-2 public health emergency.  Safety protocols were in place, including screening questions prior to the visit, additional usage of staff PPE,  and extensive cleaning of exam room while observing appropriate contact time as indicated for disinfecting solutions.  Notice: This dictation was prepared with Dragon dictation along with smaller phrase technology. Any transcriptional errors that result from this process are unintentional and may not be corrected upon review.  Patient Instructions / Medication Changes & Studies & Tests Ordered   Patient Instructions  Medication Instructions:  No changes  *If you need a refill on your cardiac medications before  your next appointment, please call your pharmacy*   Lab Work: Not needed If you have labs (blood work) drawn today and your tests are completely normal, you will receive your results only by: Marland Kitchen MyChart Message (if you have MyChart) OR . A paper copy in the mail If you have any lab test that is abnormal or we need to change your treatment, we will call you to review the results.   Testing/Procedures: Not  needed   Follow-Up: At [facility], you and your health needs are our priority.  As part of our continuing mission to provide you with exceptional heart care, we have created designated Provider Care Teams.  These Care Teams include your primary Cardiologist (physician) and Advanced Practice Providers (APPs -  Physician Assistants and Nurse Practitioners) who all work together to provide you with the care you need, when you need it.    Your next appointment:   6 month(s)  The format for your next appointment:   In Person  Provider:   [name], MD    Studies Ordered:   No orders of the defined types were placed in this encounter.    [name], M.D., M.S. Interventional Cardiologist   Pager # [phone] Phone # [phone] [address] [place], [place] [address]   Thank you for choosing Heartcare at [facility]!!

## 2020-05-27 NOTE — Patient Instructions (Addendum)
Medication Instructions:   No changes  *If you need a refill on your cardiac medications before your next appointment, please call your pharmacy*   Lab Work: Not needed If you have labs (blood work) drawn today and your tests are completely normal, you will receive your results only by: MyChart Message (if you have MyChart) OR A paper copy in the mail If you have any lab test that is abnormal or we need to change your treatment, we will call you to review the results.   Testing/Procedures:  Not needed  Follow-Up: At [facility], you and your health needs are our priority.  As part of our continuing mission to provide you with exceptional heart care, we have created designated Provider Care Teams.  These Care Teams include your primary Cardiologist (physician) and Advanced Practice Providers (APPs -  Physician Assistants and Nurse Practitioners) who all work together to provide you with the care you need, when you need it.     Your next appointment:   6 month(s)  The format for your next appointment:   In Person  Provider:   David Harding, MD  

## 2020-06-06 ENCOUNTER — Encounter: Payer: Self-pay | Admitting: Cardiology

## 2020-06-06 NOTE — Assessment & Plan Note (Addendum)
Now has 2 stents in the circumflex system, not overlapping.  No further anginal symptoms.  Minimal disease elsewhere.  Echo improved.  We are treating the distal RCA lesions medically and he is doing well with no active symptoms.  Plan: Continue current medications  On amlodipine and carvedilol for blood pressure/antianginal benefit.  On stable dose of rosuvastatin.  Tolerating well.  On maintenance dose clopidogrel.  Okay to hold clopidogrel/Plavix 5 days preop for any surgeries or procedures.  If possible, while holding Plavix, replace with 81 mg ASA until restarted pos-op.

## 2020-06-06 NOTE — Assessment & Plan Note (Signed)
Noted on CT scan.  Not unexpected.  No signs of aneurysm or ulcerative lesions.  Plan: Continue with Guideline Directed Medical Therapy for CAD.

## 2020-06-06 NOTE — Assessment & Plan Note (Signed)
Blood pressure well controlled on amlodipine and carvedilol.  No change in dosing.

## 2020-06-06 NOTE — Assessment & Plan Note (Signed)
Symptoms well controlled on current dose of beta-blocker.  Was also probably likely related to ischemia prior to PCI.

## 2020-06-06 NOTE — Assessment & Plan Note (Signed)
Labs as of August showed LDL now less than 70.  Stable.  On 20 mg of rosuvastatin tolerating well.  No myalgias or memory issues.

## 2020-06-07 ENCOUNTER — Telehealth: Payer: Self-pay | Admitting: Acute Care

## 2020-06-07 NOTE — Telephone Encounter (Signed)
I have spoken with Mr. Moser and his [facility] Ct has been scheduled for 06/23/20 @ 2:00pm at  [place]

## 2020-06-08 DIAGNOSIS — D692 Other nonthrombocytopenic purpura: Secondary | ICD-10-CM | POA: Diagnosis not present

## 2020-06-08 DIAGNOSIS — L821 Other seborrheic keratosis: Secondary | ICD-10-CM | POA: Diagnosis not present

## 2020-06-08 DIAGNOSIS — L570 Actinic keratosis: Secondary | ICD-10-CM | POA: Diagnosis not present

## 2020-06-10 ENCOUNTER — Other Ambulatory Visit: Payer: Self-pay

## 2020-06-10 ENCOUNTER — Telehealth: Payer: Self-pay | Admitting: Cardiology

## 2020-06-10 ENCOUNTER — Other Ambulatory Visit (HOSPITAL_COMMUNITY): Payer: Self-pay | Admitting: Orthopedic Surgery

## 2020-06-10 ENCOUNTER — Ambulatory Visit (HOSPITAL_COMMUNITY)
Admission: RE | Admit: 2020-06-10 | Discharge: 2020-06-10 | Disposition: A | Discharge status: Home / Self Care | Payer: PPO | Source: Ambulatory Visit | Attending: Family Medicine | Admitting: Family Medicine

## 2020-06-10 DIAGNOSIS — M7989 Other specified soft tissue disorders: Secondary | ICD-10-CM | POA: Diagnosis not present

## 2020-06-10 DIAGNOSIS — M79662 Pain in left lower leg: Secondary | ICD-10-CM | POA: Diagnosis not present

## 2020-06-10 DIAGNOSIS — Z471 Aftercare following joint replacement surgery: Secondary | ICD-10-CM

## 2020-06-10 DIAGNOSIS — Z86718 Personal history of other venous thrombosis and embolism: Secondary | ICD-10-CM

## 2020-06-10 DIAGNOSIS — I82452 Acute embolism and thrombosis of left peroneal vein: Secondary | ICD-10-CM

## 2020-06-10 DIAGNOSIS — Z96642 Presence of left artificial hip joint: Secondary | ICD-10-CM | POA: Insufficient documentation

## 2020-06-10 MED ORDER — XARELTO VTE STARTER PACK 15 & 20 MG PO TBPK: ORAL_TABLET | ORAL | 0 refills | Status: DC

## 2020-06-10 MED ORDER — RIVAROXABAN 20 MG PO TABS: 20.0000 mg | ORAL_TABLET | Freq: Every day | ORAL | 1 refills | Status: DC

## 2020-06-10 NOTE — Telephone Encounter (Signed)
Courtney from [facility] called and wanted to get a message back to Dr. Allison Quarry Nurse. The patient just had a Doppler done and the results showed a DVT. The patient is currently on plavix and [facility] didn't know what Dr. Ellyn Hack wanted to do in regards to a surgery for the patient. The patient was 9 weeks out from previous surgery.   The Doctor at [facility] is not in the office today and so she wanted to get Dr. Allison Quarry office.

## 2020-06-10 NOTE — Telephone Encounter (Signed)
Perfect timing.  [name], MD

## 2020-06-10 NOTE — Telephone Encounter (Signed)
Rn attempted to call [facility] unable to leave message. RN called and spoke to patient .    patient was aware he had a DVT  From earlier results.  Patient states he  Had knee surgery about 9 weeks ago , no more surgery schedule for the future at present time.   patient is aware Dr Ellyn Hack would like for him to continue taking plavixs . Take dual medication plavix and Xarelto  unless excessive bleeding and contact office.  prescription sent to pharmacy and mychart instructions  Xarelto  starter pack for DVT  15 mg twice a day for 21 days then take 20 mg daily   schedule  A 3 month lower extremity DVT protocol  Doppler.

## 2020-06-10 NOTE — Telephone Encounter (Signed)
-----   Message from Leonie Man, MD sent at 06/10/2020  4:21 PM EST ----- Regarding: POPLITEAL VEIN DVT Just heard from a little birdie [place] - the CCU RN friend neighbor of [name]) that Dr. Alvan Dame just found a perineal vein DVT on the leg that had a knee operation.  They are punting it to [place] to treat.   I think 61-month treatment course of Xarelto DVT therapy is reasonable.  He is on Plavix which he can continue as long as he does not have significant bleeding.  DVT treatment as the initial 3 weeks of twice daily followed by once daily.   Xarelto 50 mg p.o. twice daily x21 days then 20 mg daily to complete 3 months.   [name], MD

## 2020-06-14 NOTE — Telephone Encounter (Signed)
We should be good - agree with note from [place]. [name], MD

## 2020-06-23 ENCOUNTER — Other Ambulatory Visit: Payer: Self-pay

## 2020-06-23 ENCOUNTER — Ambulatory Visit (INDEPENDENT_AMBULATORY_CARE_PROVIDER_SITE_OTHER)
Admission: RE | Admit: 2020-06-23 | Discharge: 2020-06-23 | Disposition: A | Discharge status: Home / Self Care | Payer: PPO | Source: Ambulatory Visit | Attending: Family Medicine | Admitting: Family Medicine

## 2020-06-23 DIAGNOSIS — Z87891 Personal history of nicotine dependence: Secondary | ICD-10-CM | POA: Diagnosis not present

## 2020-06-23 DIAGNOSIS — Z122 Encounter for screening for malignant neoplasm of respiratory organs: Secondary | ICD-10-CM

## 2020-06-28 NOTE — Progress Notes (Signed)

## 2020-07-01 ENCOUNTER — Other Ambulatory Visit: Payer: Self-pay | Admitting: *Deleted

## 2020-07-01 DIAGNOSIS — Z96652 Presence of left artificial knee joint: Secondary | ICD-10-CM | POA: Diagnosis not present

## 2020-07-01 DIAGNOSIS — Z471 Aftercare following joint replacement surgery: Secondary | ICD-10-CM | POA: Diagnosis not present

## 2020-07-01 DIAGNOSIS — Z87891 Personal history of nicotine dependence: Secondary | ICD-10-CM

## 2020-07-01 DIAGNOSIS — M1611 Unilateral primary osteoarthritis, right hip: Secondary | ICD-10-CM | POA: Diagnosis not present

## 2020-07-26 ENCOUNTER — Ambulatory Visit: Payer: PPO | Admitting: Physician Assistant

## 2020-08-12 ENCOUNTER — Other Ambulatory Visit: Payer: Self-pay

## 2020-08-12 DIAGNOSIS — Z125 Encounter for screening for malignant neoplasm of prostate: Secondary | ICD-10-CM

## 2020-08-24 DIAGNOSIS — H2513 Age-related nuclear cataract, bilateral: Secondary | ICD-10-CM | POA: Diagnosis not present

## 2020-08-24 DIAGNOSIS — H02834 Dermatochalasis of left upper eyelid: Secondary | ICD-10-CM | POA: Diagnosis not present

## 2020-08-24 DIAGNOSIS — H02831 Dermatochalasis of right upper eyelid: Secondary | ICD-10-CM | POA: Diagnosis not present

## 2020-08-24 NOTE — Telephone Encounter (Signed)
Follow-up lower extremity venous Doppler request-DVT diagnosed 06/10/2020.  Continue for follow-up venous Dopplers on 09/10/2020  Pending results, can likely discontinue Eliquis. -    Plan: Order repeat left lower extremity venous Dopplers to reevaluate for DVT. -> The order appears to be in, to expire on 09/10/2020.  We can contact him to ensure this is scheduled.  [name], MD

## 2020-08-25 ENCOUNTER — Other Ambulatory Visit: Payer: Self-pay

## 2020-08-25 ENCOUNTER — Other Ambulatory Visit: Payer: PPO

## 2020-08-25 ENCOUNTER — Other Ambulatory Visit (INDEPENDENT_AMBULATORY_CARE_PROVIDER_SITE_OTHER): Payer: PPO

## 2020-08-25 DIAGNOSIS — Z20822 Contact with and (suspected) exposure to covid-19: Secondary | ICD-10-CM

## 2020-08-25 DIAGNOSIS — Z125 Encounter for screening for malignant neoplasm of prostate: Secondary | ICD-10-CM

## 2020-08-26 ENCOUNTER — Encounter: Payer: Self-pay | Admitting: Family Medicine

## 2020-08-26 LAB — PSA: PSA: 3.79 ng/mL (ref 0.10–4.00)

## 2020-08-26 LAB — NOVEL CORONAVIRUS, NAA: SARS-CoV-2, NAA: NOT DETECTED

## 2020-08-26 LAB — SARS-COV-2, NAA 2 DAY TAT

## 2020-08-27 ENCOUNTER — Telehealth: Payer: Self-pay

## 2020-08-27 ENCOUNTER — Other Ambulatory Visit: Payer: Self-pay

## 2020-08-27 DIAGNOSIS — N400 Enlarged prostate without lower urinary tract symptoms: Secondary | ICD-10-CM | POA: Diagnosis not present

## 2020-08-27 DIAGNOSIS — R972 Elevated prostate specific antigen [PSA]: Secondary | ICD-10-CM

## 2020-08-27 DIAGNOSIS — R351 Nocturia: Secondary | ICD-10-CM

## 2020-08-27 NOTE — Telephone Encounter (Signed)
Pt.'s wife called in. She wanted to know why no lab results were sent in. Wife states she called our office at 9:30 and requested PSA labs be sent in and she states she was told they would be sent to [facility] by the 11:15 am appointment. Pt.'s wife stated when she got to [facility] there were no results sent in. [facility] did not even know why pt was there. [facility] ended up looking up lab results on Epic.   Pt.'s wife also wants to address that it was written where the pt could see that pt possibly had prostate cancer. Pt.'s wife thinks this should not be put in a location the patient could see. She states pt could not sleep, due to it. Pt.'s wife states this is why she called [facility] and " took control" of the situation, because they were so worried about the prostate cancer. I reassure pt that Dr. Yong Channel is the most caring provider we have in our office and that I know he never meant to worry the pt. Pt.'s wife agreed that Dr. Yong Channel is very kind and caring and that maybe it was an accident. I told pt I would have our front office supervisor call her to listen to her concerns. Pt.'s wife was thankful

## 2020-08-27 NOTE — Telephone Encounter (Signed)
Referral has been placed. 

## 2020-08-27 NOTE — Telephone Encounter (Signed)
Patient's wife is calling in asking if Dr.hunter can send the referral to [facility], states he has an appointment today at 11:15 with Dr.Newsom. Wanted to know I few can go ahead and send his PSA results to them.

## 2020-09-10 ENCOUNTER — Ambulatory Visit (HOSPITAL_COMMUNITY)
Admission: RE | Admit: 2020-09-10 | Discharge: 2020-09-10 | Disposition: A | Discharge status: Home / Self Care | Payer: PPO | Source: Ambulatory Visit | Attending: Vascular Surgery | Admitting: Vascular Surgery

## 2020-09-10 ENCOUNTER — Other Ambulatory Visit: Payer: Self-pay

## 2020-09-10 DIAGNOSIS — I82452 Acute embolism and thrombosis of left peroneal vein: Secondary | ICD-10-CM | POA: Diagnosis not present

## 2020-09-10 DIAGNOSIS — Z86718 Personal history of other venous thrombosis and embolism: Secondary | ICD-10-CM | POA: Diagnosis not present

## 2020-09-10 DIAGNOSIS — M7989 Other specified soft tissue disorders: Secondary | ICD-10-CM

## 2020-09-10 DIAGNOSIS — M79662 Pain in left lower leg: Secondary | ICD-10-CM | POA: Insufficient documentation

## 2020-09-15 DIAGNOSIS — Z1159 Encounter for screening for other viral diseases: Secondary | ICD-10-CM | POA: Diagnosis not present

## 2020-09-19 ENCOUNTER — Encounter: Payer: Self-pay | Admitting: Family Medicine

## 2020-09-20 ENCOUNTER — Other Ambulatory Visit: Payer: Self-pay

## 2020-09-20 MED ORDER — LORAZEPAM 0.5 MG PO TABS: 0.5000 mg | ORAL_TABLET | Freq: Every evening | ORAL | 1 refills | Status: DC | PRN

## 2020-09-20 NOTE — Progress Notes (Signed)
I refilled this 

## 2020-10-02 ENCOUNTER — Other Ambulatory Visit: Payer: Self-pay | Admitting: General Practice

## 2020-10-10 ENCOUNTER — Encounter: Payer: Self-pay | Admitting: Family Medicine

## 2020-10-11 ENCOUNTER — Other Ambulatory Visit: Payer: Self-pay

## 2020-10-11 MED ORDER — NITROGLYCERIN 0.4 MG SL SUBL: 0.4000 mg | SUBLINGUAL_TABLET | SUBLINGUAL | 1 refills | Status: DC | PRN

## 2020-10-20 DIAGNOSIS — I2510 Atherosclerotic heart disease of native coronary artery without angina pectoris: Secondary | ICD-10-CM | POA: Diagnosis not present

## 2020-10-20 DIAGNOSIS — K219 Gastro-esophageal reflux disease without esophagitis: Secondary | ICD-10-CM | POA: Diagnosis not present

## 2020-10-20 DIAGNOSIS — Z1211 Encounter for screening for malignant neoplasm of colon: Secondary | ICD-10-CM | POA: Diagnosis not present

## 2020-10-21 ENCOUNTER — Telehealth: Payer: Self-pay

## 2020-10-21 NOTE — Telephone Encounter (Signed)
   [facility] Pre-operative Risk Assessment    HEARTCARE STAFF: - Please ensure there is not already an duplicate clearance open for this procedure. - Under Visit Info/Reason for Call, type in Other and utilize the format Clearance MM/DD/YY or Clearance TBD. Do not use dashes or single digits. - If request is for dental extraction, please clarify the # of teeth to be extracted.  Request for surgical clearance:  1. What type of surgery is being performed? Colonoscopy   2. When is this surgery scheduled? 11/16/20   3. What type of clearance is required (medical clearance vs. Pharmacy clearance to hold med vs. Both)? Both   4. Are there any medications that need to be held prior to surgery and how long? Plavix  5. Practice name and name of physician performing surgery? [facility], [place]   6. What is the office phone number? [phone]   7.   What is the office fax number? [phone]  8.   Anesthesia type (None, local, MAC, general) ? Propofol   Jacqulynn Cadet 10/21/2020, 3:31 PM  _________________________________________________________________   (provider comments below)

## 2020-10-22 ENCOUNTER — Other Ambulatory Visit: Payer: Self-pay | Admitting: Physician Assistant

## 2020-10-22 NOTE — Telephone Encounter (Signed)
   Primary Cardiologist: [name], MD  Chart reviewed as part of pre-operative protocol coverage. Patient was contacted 10/22/2020 in reference to pre-operative risk assessment for pending surgery as outlined below.  Terry Wright was last seen on 05/27/2020 by Dr. Ellyn Hack.  Since that day, Terry Wright has done well without chest pain or shortness of breath.  He may come off of Plavix for 5 days prior to the procedure, we would prefer him to bridge dose those 5 days with baby aspirin instead.  He may restart the Plavix as soon as possible after the procedure based on the GI doctor's discretion.  Therefore, based on ACC/AHA guidelines, the patient would be at acceptable risk for the planned procedure without further cardiovascular testing.   The patient was advised that if he develops new symptoms prior to surgery to contact our office to arrange for a follow-up visit, and he verbalized understanding.  I will route this recommendation to the requesting party via Epic fax function and remove from pre-op pool. Please call with questions.  [place], [place] 10/22/2020, 1:27 PM

## 2020-11-01 ENCOUNTER — Other Ambulatory Visit: Payer: Self-pay | Admitting: Cardiology

## 2020-11-01 MED ORDER — CARVEDILOL 12.5 MG PO TABS: 12.5000 mg | ORAL_TABLET | Freq: Two times a day (BID) | ORAL | 1 refills | Status: DC

## 2020-11-01 MED ORDER — ROSUVASTATIN CALCIUM 20 MG PO TABS: 20.0000 mg | ORAL_TABLET | Freq: Every day | ORAL | 1 refills | Status: DC

## 2020-11-01 MED ORDER — CLOPIDOGREL BISULFATE 75 MG PO TABS: 75.0000 mg | ORAL_TABLET | Freq: Every day | ORAL | 1 refills | Status: DC

## 2020-11-01 NOTE — Telephone Encounter (Signed)
Crestor, plavix, carvedilol refilled

## 2020-11-16 DIAGNOSIS — K6389 Other specified diseases of intestine: Secondary | ICD-10-CM | POA: Diagnosis not present

## 2020-11-16 DIAGNOSIS — Z1211 Encounter for screening for malignant neoplasm of colon: Secondary | ICD-10-CM | POA: Diagnosis not present

## 2020-11-16 DIAGNOSIS — K635 Polyp of colon: Secondary | ICD-10-CM | POA: Diagnosis not present

## 2020-11-16 LAB — HM COLONOSCOPY

## 2020-11-17 ENCOUNTER — Encounter: Payer: Self-pay | Admitting: Family Medicine

## 2020-11-23 DIAGNOSIS — L308 Other specified dermatitis: Secondary | ICD-10-CM | POA: Diagnosis not present

## 2020-11-23 DIAGNOSIS — D692 Other nonthrombocytopenic purpura: Secondary | ICD-10-CM | POA: Diagnosis not present

## 2020-11-23 DIAGNOSIS — D1801 Hemangioma of skin and subcutaneous tissue: Secondary | ICD-10-CM | POA: Diagnosis not present

## 2020-11-23 DIAGNOSIS — L821 Other seborrheic keratosis: Secondary | ICD-10-CM | POA: Diagnosis not present

## 2020-11-23 DIAGNOSIS — L814 Other melanin hyperpigmentation: Secondary | ICD-10-CM | POA: Diagnosis not present

## 2020-11-23 DIAGNOSIS — L570 Actinic keratosis: Secondary | ICD-10-CM | POA: Diagnosis not present

## 2020-11-23 DIAGNOSIS — L820 Inflamed seborrheic keratosis: Secondary | ICD-10-CM | POA: Diagnosis not present

## 2020-11-23 NOTE — Progress Notes (Signed)
Cardiology Clinic Note   Patient Name: Terry Wright Date of Encounter: 11/24/2020  Primary Care Provider:  Marin Olp, MD Primary Cardiologist:  Terry Hew, MD  Patient Profile    Terry Wright 65 year old male presents today for evaluation of his coronary artery disease.  Past Medical History    Past Medical History:  Diagnosis Date  . Anginal pain ([place]) 2018  . Anxiety   . Arthritis    shoulders hips,   . CAD S/P percutaneous coronary angioplasty 07/2016, 02/2018   a) 100% pCx -- DES PCI with 4.0 x 16 mm Synergy DES. 65% ostial OM 3 (relatively small caliber - Med Rx);; b) 03/27/18 PCI/DES to dLCX/OM3 x1. Patent pCX DES. 70% dRCA trifurcation lesion (Med Rx). Normal EF  . COPD (chronic obstructive pulmonary disease) ([place]) 2018   mild  . DVT (deep venous thrombosis) ([facility])    Right upper arm DVT on 2 occasions in the remote past  . Dysrhythmia    V tac  . Ejection fraction    LV function normal, echo, February, 2010  . GERD (gastroesophageal reflux disease)   . Hyperlipidemia   . Hypertension   . Mitral regurgitation    Mild, echo, February, 2010  . Palpitations    Mild in the past  . Peripheral vascular disease ([place]) 1980s   DVT  . Pre-diabetes   . Statin intolerance    Felt poorly after Lipitor, Crestor, TriCor  . STEMI involving left circumflex coronary artery ([place]) 08/04/2016   Occluded very large caliber codominant circumflex - PCI with single Synergy DES  . Tobacco abuse    Past Surgical History:  Procedure Laterality Date  . CARDIAC CATHETERIZATION N/A 08/04/2016   Procedure: Left Heart Cath and Coronary Angiography;  Surgeon: Leonie Man, MD;  Location: [facility];  Service: Cardiovascular;  Laterality: N/A: 100% pCx --> PCI. residual 65% pOM3 (small).  ~EF 35-45%  with lateral HK.  Marland Kitchen CARDIAC CATHETERIZATION N/A 08/04/2016   Procedure: CORONARY STENT INTERVENTION;  Surgeon: Leonie Man, MD;  Location: [facility];   Service: Cardiovascular;  Laterality: N/A: pCx 100%-0%: Synergy DES 4.0 x 16 (4.6 mm)  . CHONDROPLASTY Right 04/04/2017   Procedure: CHONDROPLASTY;  Surgeon: Dorna Leitz, MD;  Location: [facility];  Service: Orthopedics;  Laterality: Right;  . CORONARY STENT INTERVENTION N/A 03/27/2018   Procedure: CORONARY STENT INTERVENTION;  Surgeon: Martinique, Peter M, MD;  Location: [facility];  Service: Cardiovascular:: dCx-OM3 95%-0%: DES PCI - Synergy 2.25 x 16 mm.  Marland Kitchen GANGLION CYST EXCISION     oct 2017  . HERNIA REPAIR     umbilical hernia 2017 oct  . KNEE ARTHROSCOPY Right 04/04/2017   Procedure: ARTHROSCOPY RIGHT KNEE, WITH MEIDAL FEMORAL CONDYLE PATELLAR MEDIAL FEMORAL JOINT PLICA EXCISION ;  Surgeon: Dorna Leitz, MD;  Location: [facility];  Service: Orthopedics;  Laterality: Right;  . LEFT HEART CATH AND CORONARY ANGIOGRAPHY N/A 03/27/2018   Procedure: LEFT HEART CATH AND CORONARY ANGIOGRAPHY;  Surgeon: Martinique, Peter M, MD;  Location: [facility];  Service: Cardiovascular:   2 vessel obstructive CAD: - 95% dLCx/OM3 --Successful PCI of the dLCx/OM3 with SYNERGY DES 2.25X16, -70% distal RCA at trifurcation ([facility] Management).  Widely patent pLCx DES. Low normal LV function (50-55%), Normal LVEDP   . TOTAL HIP ARTHROPLASTY Left 04/08/2020   Procedure: TOTAL HIP ARTHROPLASTY ANTERIOR APPROACH;  Surgeon: Paralee Cancel, MD;  Location: [facility];  Service: Orthopedics;  Laterality:  Left;  70 mins  . TRANSTHORACIC ECHOCARDIOGRAM  08/05/2016   post STEMI: EF 40-45%. Mild concentric LVH. Severe HK of basal-mid inferolateral wall consistent with infarct in this distribution. GR 1 DD  . TRANSTHORACIC ECHOCARDIOGRAM  01/11/2017   EF 50-55%. GR 1 DD. Mild to moderate LA dilation.    Allergies  Allergies  Allergen Reactions  . Metformin And Related     After starting metformin- Severe hip pain bilaterally worse on left. Could not turn neck. Back pain. Symptoms drastically improved off metformin    History of  Present Illness    Terry Wright has a past medical history of coronary artery disease with PCI in the setting of inferior STEMI/VT arrest 07/2016 and repeat PCI for angina in 02/2018.  He also has hypertension, history of ischemic cardiomyopathy, mitral regurgitation, GERD, emphysema, dyslipidemia, and panic attacks.  He was last seen by Dr. Ellyn Hack on 04/29/2019.  During that time he was doing well.  His aspirin was discontinued and his Plavix was continued.  He continued to be very active, his blood pressure was well controlled on 12.5 carvedilol twice daily, and denied chest pain.  He presented to the emergency department on 08/12/2019.  EKG normal sinus rhythm first-degree AV block, high-sensitivity troponin 7 and 8, chest x-ray normal, BMP and CBC WDL.  He presented the cardiology clinic 08/13/2019 for further evaluation and stated he began to notice chest discomfort that he describes as intermittent ongoing 08/11/2019.  He drove himself to the emergency department waited in the parking lot and his chest discomfort subsided.  He drove home in the evening after eating he was going to bed and developed chest pain again.  He took 1 nitroglycerin but did not alleviate his symptoms.  He again presented to the emergency department where he received a negative ischemic eval.  He became worried due to his symptoms being similar to his previous MI.  He stated he was compliant with all of his medications including his pantoprazole.  He continued to be physically active and walk 30 minutes briskly most days of the week.  This did not bring on chest discomfort or pain.  His blood pressure has been well controlled, he had a regular rate and rhythm, no increased stress, and had not changed his diet.  I  added amlodipine 2.5 daily and planned follow-up with Dr. Ellyn Hack with his previously scheduled appointment.  He was seen by Dr. Ellyn Hack on 10/24/2019.  He reported he was feeling better.  He denied further episodes of  chest discomfort.  He continued to be very physically active and gone back to the gym.  He also reported increased walking.  He denied PND and orthopnea.  He reported that he only noted chest discomfort at rest and no exertional chest discomfort previously.  He denied palpitations.  His only limiting factor was hip pain with walking.  He was seen again in follow-up on 05/27/2020.  During that time he remained stable.  He denied orthopnea PND, palpitations, syncope.  He underwent hip surgery which went well.  Follow-up was planned for 6 months.  He presents the clinic today for follow-up evaluation states he feels well.  He feels he is finally fairly well recovered from his left hip surgery.  He plans to do his right hip surgery but will postpone the surgery as long as he can.  He is walking more than 10,000 steps a day and uses his treadmill or stationary bike daily.  He continues to eat  a heart healthy diet.  We reviewed his angiography and previous lipid panel.  I will have him follow-up in 12 months.  Today he denies chest pain, shortness of breath, lower extremity edema, fatigue, palpitations, melena, hematuria, hemoptysis, diaphoresis, weakness, presyncope, syncope, orthopnea, and PND.  Home Medications    Prior to Admission medications   Medication Sig Start Date End Date Taking? Authorizing Provider  amLODipine (NORVASC) 2.5 MG tablet TAKE 1 TABLET BY MOUTH EVERY DAY 10/04/20   Leonie Man, MD  carvedilol (COREG) 12.5 MG tablet Take 1 tablet (12.5 mg total) by mouth 2 (two) times daily. 11/01/20   Leonie Man, MD  clopidogrel (PLAVIX) 75 MG tablet Take 1 tablet (75 mg total) by mouth daily. 11/01/20   Leonie Man, MD  docusate sodium (COLACE) 100 MG capsule Take 1 capsule (100 mg total) by mouth 2 (two) times daily. 04/08/20   Danae Orleans, PA-C  ferrous sulfate (FERROUSUL) 325 (65 FE) MG tablet Take 1 tablet (325 mg total) by mouth 3 (three) times daily with meals for 14 days. 04/08/20  05/27/20  Danae Orleans, PA-C  HYDROcodone-acetaminophen (NORCO) 7.5-325 MG tablet Take 1-2 tablets by mouth every 4 (four) hours as needed for moderate pain. 04/08/20   Danae Orleans, PA-C  LORazepam (ATIVAN) 0.5 MG tablet Take 1 tablet (0.5 mg total) by mouth at bedtime as needed for anxiety. 09/20/20   Terry Olp, MD  methocarbamol (ROBAXIN) 500 MG tablet Take 1 tablet (500 mg total) by mouth every 6 (six) hours as needed for muscle spasms. 04/08/20   Danae Orleans, PA-C  nitroGLYCERIN (NITROSTAT) 0.4 MG SL tablet Place 1 tablet (0.4 mg total) under the tongue every 5 (five) minutes as needed for chest pain. 10/11/20   Terry Olp, MD  pantoprazole (PROTONIX) 40 MG tablet Take 1 tablet (40 mg total) by mouth daily. 01/30/20   Hilty, Nadean Corwin, MD  polyethylene glycol (MIRALAX / GLYCOLAX) 17 g packet Take 17 g by mouth 2 (two) times daily. 04/08/20   Danae Orleans, PA-C  rosuvastatin (CRESTOR) 20 MG tablet Take 1 tablet (20 mg total) by mouth daily. 11/01/20   Leonie Man, MD    Family History    Family History  Problem Relation Age of Onset  . Heart attack Mother        x2- lived into 80s  . Hypertension Father        lived into 20s  . Other Brother        killed in [place]   He indicated that his mother is deceased. He indicated that his father is deceased. He indicated that his brother is deceased.  Social History    Social History   Socioeconomic History  . Marital status: Married    Spouse name: [name]  . Number of children: Not on file  . Years of education: 7  . Highest education level: High school graduate  Occupational History  . Not on file  Tobacco Use  . Smoking status: Former Smoker    Packs/day: 1.00    Years: 43.00    Pack years: 43.00    Quit date: 2015    Years since quitting: 7.3  . Smokeless tobacco: Never Used  . Tobacco comment: Former smoker quit 2015  Vaping Use  . Vaping Use: Never used  Substance and Sexual Activity  . Alcohol  use: Yes    Alcohol/week: 7.0 standard drinks    Types: 7 Glasses of wine per week  Comment: occasional  . Drug use: No  . Sexual activity: Not on file  Other Topics Concern  . Not on file  Social History Narrative   Married 10 years in 2018. 2 step kids- 21 and 27. 3 kids but he does not have contact with. No grandkids      Retired Theatre stage manager.    Wife working [place]      Hobbies: golf (needs shoulder replacement though, working in the yard, working out- was doing this regularly even before MI- walking/treadmill   Social Determinants of Radio broadcast assistant Strain: Not on Art therapist Insecurity: Not on file  Transportation Needs: Not on file  Physical Activity: Not on file  Stress: Not on file  Social Connections: Not on file  Intimate Partner Violence: Not on file     Review of Systems    General:  No chills, fever, night sweats or weight changes.  Cardiovascular:  No chest pain, dyspnea on exertion, edema, orthopnea, palpitations, paroxysmal nocturnal dyspnea. Dermatological: No rash, lesions/masses Respiratory: No cough, dyspnea Urologic: No hematuria, dysuria Abdominal:   No nausea, vomiting, diarrhea, bright red blood per rectum, melena, or hematemesis Neurologic:  No visual changes, wkns, changes in mental status. All other systems reviewed and are otherwise negative except as noted above.  Physical Exam    VS:  BP 126/78   Pulse 65   Ht 6\' 1"  (1.854 m)   Wt 211 lb 3.2 oz (95.8 kg)   SpO2 97%   BMI 27.86 kg/m  , BMI Body mass index is 27.86 kg/m. GEN: Well nourished, well developed, in no acute distress. HEENT: normal. Neck: Supple, no JVD, carotid bruits, or masses. Cardiac: RRR, no murmurs, rubs, or gallops. No clubbing, cyanosis, edema.  Radials/DP/PT 2+ and equal bilaterally.  Respiratory:  Respirations regular and unlabored, clear to auscultation bilaterally. GI: Soft, nontender, nondistended, BS + x 4. MS: no deformity or  atrophy. Skin: warm and dry, no rash. Neuro:  Strength and sensation are intact. Psych: Normal affect.  Accessory Clinical Findings    Recent Labs: 03/15/2020: ALT 16 03/30/2020: BUN 22; Creatinine, Ser 0.92; Hemoglobin 15.0; Platelets 211; Potassium 4.2; Sodium 141   Recent Lipid Panel    Component Value Date/Time   CHOL 144 03/15/2020 1529   CHOL 110 05/09/2017 0821   TRIG 326 (H) 03/15/2020 1529   HDL 38 (L) 03/15/2020 1529   HDL 35 (L) 05/09/2017 0821   CHOLHDL 3.8 03/15/2020 1529   VLDL 28.2 05/02/2019 0938   LDLCALC 67 03/15/2020 1529   LDLDIRECT 159.1 06/05/2011 0948    ECG personally reviewed by me today-sinus rhythm first-degree AV block 65 bpm no acute changes.  EKG 08/13/2019 sinus rhythm first-degree AV block 61 bpm- No acute changes  EKG 08/12/2019 Sinus rhythm first-degree AV block 65 bpm  Echocardiogram 01/11/2017 Study Conclusions  - Left ventricle: Mild lateral hypokinesis. The cavity size was normal. Wall thickness was normal. Systolic function was normal. The estimated ejection fraction was in the range of 50% to 55%. Doppler parameters are consistent with abnormal left ventricular relaxation (grade 1 diastolic dysfunction). - Left atrium: The atrium was mildly to moderately dilated. - Right atrium: The atrium was mildly dilated.  Cardiac catheterization 03/27/2018   Ost LAD to Prox LAD lesion is 20% stenosed.  Previously placed Prox Cx drug eluting stent is widely patent.  Ost 3rd Mrg lesion is 95% stenosed.  A drug-eluting stent was successfully placed using a STENT SYNERGY DES 2.25X16.  Post intervention, there is a 0% residual stenosis.  Dist RCA-1 lesion is 20% stenosed.  Dist RCA-2 lesion is 70% stenosed.  There is mild left ventricular systolic dysfunction.  LV end diastolic pressure is normal.  The left ventricular ejection fraction is 50-55% by visual estimate.  1. 2 vessel obstructive CAD - 95% distal  LCx/OM3 -70% distal RCA at trifurcation 2. Widely patent stent in the proximal LCx 3. Low normal LV function 4. Normal LVEDP 5. Successful PCI of the distal LCx/OM3 with DES  Anticipate DC in am.  Recommend uninterrupted dual antiplatelet therapy with Aspirin 81mg  daily and Clopidogrel 75mg  dailyfor a minimum of 12 months (ACS - Class I recommendation).  Diagnostic Dominance: Right  Intervention     Assessment & Plan   1.  Coronary artery disease-no chest pain today.  Underwent cardiac catheterization 03/27/2018 and received DES x1 to his LCx/OM 3. Continue amlodipine, carvedilol, nitroglycerin, rosuvastatin.   Heart healthy low-sodium diet-salty 6 given Increase physical activity as tolerated  Ischemic cardiomyopathy-echocardiogram 01/11/2017 showed EF of 50 to 55%.  No increased dyspnea on exertion Continue carvedilol  Heart healthy low-sodium diet-salty 6 given  Essential hypertension-BP today 126/78.  Well-controlled at home. Continue carvedilol , amlodipine Maintain physical activity Heart healthy low-sodium diet-salty 6 given  Hyperlipidemia-03/15/2020: Cholesterol 144; HDL 38; LDL Cholesterol (Calc) 67; Triglycerides 326 Continue rosuvastatin  Heart healthy low-sodium high-fiber diet Increase physical activity as tolerated  GERD-no increased reflux symptoms, burning, or belching. Continue Protonix GERD diet Maintain healthy weight  Disposition: Follow-up with Dr. Ellyn Hack in 12 months.   Jossie Ng. Yusef Lamp NP-C    11/24/2020, 2:28 PM [facility] [place] [address] Office [phone] Fax [phone]  Notice: This dictation was prepared with Dragon dictation along with smaller phrase technology. Any transcriptional errors that result from this process are unintentional and may not be corrected upon review.  I spent 13 minutes examining this patient, reviewing medications, and using patient centered shared  decision making involving her cardiac care.  Prior to her visit I spent greater than 20 minutes reviewing her past medical history,  medications, and prior cardiac tests.

## 2020-11-24 ENCOUNTER — Ambulatory Visit: Payer: PPO | Admitting: General Practice

## 2020-11-24 ENCOUNTER — Other Ambulatory Visit: Payer: Self-pay

## 2020-11-24 ENCOUNTER — Encounter: Payer: Self-pay | Admitting: General Practice

## 2020-11-24 VITALS — BP 126/78 | HR 65 | Ht 73.0 in | Wt 211.2 lb

## 2020-11-24 DIAGNOSIS — K219 Gastro-esophageal reflux disease without esophagitis: Secondary | ICD-10-CM | POA: Diagnosis not present

## 2020-11-24 DIAGNOSIS — I10 Essential (primary) hypertension: Secondary | ICD-10-CM | POA: Diagnosis not present

## 2020-11-24 DIAGNOSIS — I255 Ischemic cardiomyopathy: Secondary | ICD-10-CM

## 2020-11-24 DIAGNOSIS — I2510 Atherosclerotic heart disease of native coronary artery without angina pectoris: Secondary | ICD-10-CM | POA: Diagnosis not present

## 2020-11-24 DIAGNOSIS — E785 Hyperlipidemia, unspecified: Secondary | ICD-10-CM

## 2020-11-24 DIAGNOSIS — Z9861 Coronary angioplasty status: Secondary | ICD-10-CM | POA: Diagnosis not present

## 2020-11-24 NOTE — Patient Instructions (Signed)
Medication Instructions:  The current medical regimen is effective;  continue present plan and medications as directed. Please refer to the Current Medication list given to you today.  *If you need a refill on your cardiac medications before your next appointment, please call your pharmacy*  Lab Work:   Testing/Procedures:  NONE    NONE  Special Instructions CONTINUE CURRENT DIET AND EXERCISE REGIMEN  TAKE AND LOG YOU BLOOD PRESSURE 2 TIMES MONTHLY AND WHEN YOU FEEL BAD  Follow-Up: Your next appointment:  12 month(s) In Person with [name], MD ONLY  Please call our office 2 months in advance to schedule this appointment   At [facility], you and your health needs are our priority.  As part of our continuing mission to provide you with exceptional heart care, we have created designated Provider Care Teams.  These Care Teams include your primary Cardiologist (physician) and Advanced Practice Providers (APPs -  Physician Assistants and Nurse Practitioners) who all work together to provide you with the care you need, when you need it.

## 2020-11-25 ENCOUNTER — Ambulatory Visit: Payer: PPO | Admitting: Cardiology

## 2020-12-16 ENCOUNTER — Ambulatory Visit: Payer: PPO | Attending: Critical Care Medicine

## 2020-12-16 ENCOUNTER — Telehealth: Payer: Self-pay

## 2020-12-16 ENCOUNTER — Encounter: Payer: Self-pay | Admitting: Family Medicine

## 2020-12-16 DIAGNOSIS — Z20822 Contact with and (suspected) exposure to covid-19: Secondary | ICD-10-CM | POA: Diagnosis not present

## 2020-12-16 DIAGNOSIS — U071 COVID-19: Secondary | ICD-10-CM | POA: Diagnosis not present

## 2020-12-16 DIAGNOSIS — R509 Fever, unspecified: Secondary | ICD-10-CM | POA: Diagnosis not present

## 2020-12-16 DIAGNOSIS — R0981 Nasal congestion: Secondary | ICD-10-CM | POA: Diagnosis not present

## 2020-12-16 DIAGNOSIS — R52 Pain, unspecified: Secondary | ICD-10-CM | POA: Diagnosis not present

## 2020-12-16 NOTE — Telephone Encounter (Signed)
Patient called in and stated he just stated he is Covid Positive and wants to know if Dr. Yong Channel can either put in orders for Antibodies treatment or the oral Antibodies viral drug.

## 2020-12-16 NOTE — Telephone Encounter (Signed)
Patient has been scheduled for a Virtual visit with Dr. Sarajane Jews for 12/17/20

## 2020-12-16 NOTE — Telephone Encounter (Signed)
Please schedule virtual for pt to discuss with Dr. Hunter. °

## 2020-12-17 ENCOUNTER — Encounter: Payer: Self-pay | Admitting: Family Medicine

## 2020-12-17 ENCOUNTER — Other Ambulatory Visit: Payer: Self-pay

## 2020-12-17 ENCOUNTER — Telehealth (INDEPENDENT_AMBULATORY_CARE_PROVIDER_SITE_OTHER): Payer: PPO | Admitting: Family Medicine

## 2020-12-17 VITALS — Temp 99.5°F | Wt 211.0 lb

## 2020-12-17 DIAGNOSIS — U071 COVID-19: Secondary | ICD-10-CM

## 2020-12-17 LAB — NOVEL CORONAVIRUS, NAA: SARS-CoV-2, NAA: DETECTED — AB

## 2020-12-17 LAB — SARS-COV-2, NAA 2 DAY TAT

## 2020-12-17 MED ORDER — MOLNUPIRAVIR EUA 200MG CAPSULE: 4.0000 | ORAL_CAPSULE | Freq: Two times a day (BID) | ORAL | 0 refills | Status: AC

## 2020-12-17 NOTE — Progress Notes (Signed)
   Subjective:    Patient ID: Terry Wright, male    DOB: 1956/03/10, 65 y.o.   MRN: [id number]  HPI Virtual Visit via Telephone Note  I connected with the patient on 12/17/20 at  9:30 AM EDT by telephone and verified that I am speaking with the correct person using two identifiers.   I discussed the limitations, risks, security and privacy concerns of performing an evaluation and management service by telephone and the availability of in person appointments. I also discussed with the patient that there may be a patient responsible charge related to this service. The patient expressed understanding and agreed to proceed.  Location patient: home Location provider: work or home office Participants present for the call: patient, provider Patient did not have a visit in the prior 7 days to address this/these issue(s).   History of Present Illness: Here for a Covid-19 infection. For the past 3 days he has had fevers, body aches, headache, and chills. No cough or SOB. No NVD. He is drinking fluids and taking Tylenol. He went to an urgent care yesterday and he tested positive for the Covid virus (as did his wife).   Observations/Objective: Patient sounds cheerful and well on the phone. I do not appreciate any SOB. Speech and thought processing are grossly intact. Patient reported vitals:  Assessment and Plan: Covid-19 infection. Treat with 5 days of Molnupiravir BID. Quarantine at home for 5 days.  Alysia Penna, MD   Follow Up Instructions:     [phone] 11-20 [id number] 21-30 I did not refer this patient for an OV in the next 24 hours for this/these issue(s).  I discussed the assessment and treatment plan with the patient. The patient was provided an opportunity to ask questions and all were answered. The patient agreed with the plan and demonstrated an understanding of the instructions.   The patient was advised to call back or seek an in-person evaluation if the  symptoms worsen or if the condition fails to improve as anticipated.  I provided 14 minutes of non-face-to-face time during this encounter.   Alysia Penna, MD    Review of Systems     Objective:   Physical Exam        Assessment & Plan:

## 2021-01-17 ENCOUNTER — Other Ambulatory Visit: Payer: Self-pay | Admitting: Internal Medicine

## 2021-01-17 MED ORDER — PANTOPRAZOLE SODIUM 40 MG PO TBEC: 40.0000 mg | DELAYED_RELEASE_TABLET | Freq: Every day | ORAL | 2 refills | Status: DC

## 2021-02-09 DIAGNOSIS — N400 Enlarged prostate without lower urinary tract symptoms: Secondary | ICD-10-CM | POA: Diagnosis not present

## 2021-02-09 LAB — PSA: PSA: 3.82

## 2021-02-14 DIAGNOSIS — N400 Enlarged prostate without lower urinary tract symptoms: Secondary | ICD-10-CM | POA: Diagnosis not present

## 2021-02-18 ENCOUNTER — Encounter: Payer: Self-pay | Admitting: Family Medicine

## 2021-02-22 ENCOUNTER — Other Ambulatory Visit: Payer: Self-pay

## 2021-02-22 MED ORDER — AMLODIPINE BESYLATE 2.5 MG PO TABS: 2.5000 mg | ORAL_TABLET | Freq: Every day | ORAL | 2 refills | Status: DC

## 2021-02-22 NOTE — Telephone Encounter (Signed)
Last refill: 09/20/20 #90, 1 Last ov: 03/15/20 dx. CPE

## 2021-02-22 NOTE — Telephone Encounter (Signed)
Team why does he not at minimum have yearly visit set up and also why is he running out- should have rx available through 03/20/21 correct? Please investigate and get back to me

## 2021-02-23 MED ORDER — LORAZEPAM 0.5 MG PO TABS: 0.5000 mg | ORAL_TABLET | Freq: Every evening | ORAL | 1 refills | Status: DC | PRN

## 2021-02-23 NOTE — Telephone Encounter (Signed)
Called and spoke with requesting pharmacy and pt [facility] but [facility] no longer uses [facility] so he was [facility] which is why they sent the Rx request. I have sent a message to scheduling to get pt scheduled for a visit.

## 2021-02-23 NOTE — Telephone Encounter (Signed)
Patient is scheduled for a medication follow up and a CPE in January.

## 2021-03-24 DIAGNOSIS — J029 Acute pharyngitis, unspecified: Secondary | ICD-10-CM | POA: Diagnosis not present

## 2021-03-31 DIAGNOSIS — M25551 Pain in right hip: Secondary | ICD-10-CM | POA: Diagnosis not present

## 2021-03-31 DIAGNOSIS — Z96642 Presence of left artificial hip joint: Secondary | ICD-10-CM | POA: Diagnosis not present

## 2021-03-31 DIAGNOSIS — M1611 Unilateral primary osteoarthritis, right hip: Secondary | ICD-10-CM | POA: Diagnosis not present

## 2021-04-05 ENCOUNTER — Encounter: Payer: Self-pay | Admitting: Family Medicine

## 2021-04-05 ENCOUNTER — Other Ambulatory Visit: Payer: Self-pay

## 2021-04-05 ENCOUNTER — Ambulatory Visit (INDEPENDENT_AMBULATORY_CARE_PROVIDER_SITE_OTHER): Payer: PPO | Admitting: Family Medicine

## 2021-04-05 VITALS — BP 130/76 | HR 56 | Temp 98.4°F | Resp 17 | Ht 73.0 in | Wt 206.4 lb

## 2021-04-05 DIAGNOSIS — Z9861 Coronary angioplasty status: Secondary | ICD-10-CM

## 2021-04-05 DIAGNOSIS — R739 Hyperglycemia, unspecified: Secondary | ICD-10-CM | POA: Diagnosis not present

## 2021-04-05 DIAGNOSIS — L821 Other seborrheic keratosis: Secondary | ICD-10-CM | POA: Diagnosis not present

## 2021-04-05 DIAGNOSIS — I2510 Atherosclerotic heart disease of native coronary artery without angina pectoris: Secondary | ICD-10-CM | POA: Diagnosis not present

## 2021-04-05 DIAGNOSIS — L2089 Other atopic dermatitis: Secondary | ICD-10-CM | POA: Diagnosis not present

## 2021-04-05 DIAGNOSIS — E785 Hyperlipidemia, unspecified: Secondary | ICD-10-CM | POA: Diagnosis not present

## 2021-04-05 DIAGNOSIS — L738 Other specified follicular disorders: Secondary | ICD-10-CM | POA: Diagnosis not present

## 2021-04-05 DIAGNOSIS — R351 Nocturia: Secondary | ICD-10-CM

## 2021-04-05 DIAGNOSIS — Z23 Encounter for immunization: Secondary | ICD-10-CM | POA: Diagnosis not present

## 2021-04-05 DIAGNOSIS — D1801 Hemangioma of skin and subcutaneous tissue: Secondary | ICD-10-CM | POA: Diagnosis not present

## 2021-04-05 DIAGNOSIS — I10 Essential (primary) hypertension: Secondary | ICD-10-CM

## 2021-04-05 DIAGNOSIS — L570 Actinic keratosis: Secondary | ICD-10-CM | POA: Diagnosis not present

## 2021-04-05 MED ORDER — AMLODIPINE BESYLATE 2.5 MG PO TABS: 2.5000 mg | ORAL_TABLET | Freq: Every day | ORAL | 3 refills | Status: DC

## 2021-04-05 MED ORDER — LORAZEPAM 0.5 MG PO TABS: 0.5000 mg | ORAL_TABLET | Freq: Every evening | ORAL | 1 refills | Status: DC | PRN

## 2021-04-05 NOTE — Progress Notes (Signed)
Phone [phone] In person visit   Subjective:   Terry Wright is a 65 y.o. year old very pleasant male patient who presents for/with See problem oriented charting Chief Complaint  Patient presents with   Medication Refill    Pt here for follow up on medications, no concerns no questions patient is requesting refills today    This visit occurred during the SARS-CoV-2 public health emergency.  Safety protocols were in place, including screening questions prior to the visit, additional usage of staff PPE, and extensive cleaning of exam room while observing appropriate contact time as indicated for disinfecting solutions.   Past Medical History-  Patient Active Problem List   Diagnosis Date Noted   CAD S/P percutaneous coronary angioplasty 09/21/2016    Priority: High   Insomnia 09/21/2016    Priority: High   STEMI involving left circumflex coronary artery ([place]) -complicated by cardiac arrest 08/04/2016    Priority: High   History of DVT (deep vein thrombosis)     Priority: High   Former smoker     Priority: High   Centrilobular emphysema ([place]) 09/04/2017    Priority: Medium   Ischemic cardiomyopathy 10/12/2016    Priority: Medium   Hyperglycemia 09/21/2016    Priority: Medium   Dyslipidemia, goal LDL below 50     Priority: Medium   Essential hypertension     Priority: Medium   Unstable angina (HCC)     Priority: Low   Aortic atherosclerosis ([place]) 09/04/2017    Priority: Low   Acute medial meniscus tear of right knee 04/04/2017    Priority: Low   GERD (gastroesophageal reflux disease)     Priority: Low   Ventricular bigeminy     Priority: Low   COVID-19 virus infection 12/17/2020   Left hip OA 04/08/2020   S/P left THA, AA 04/08/2020   Chondromalacia, right knee 04/04/2017    Medications- reviewed and updated Current Outpatient Medications  Medication Sig Dispense Refill   carvedilol (COREG) 12.5 MG tablet Take 1 tablet (12.5 mg total) by mouth 2  (two) times daily. 180 tablet 1   clopidogrel (PLAVIX) 75 MG tablet Take 1 tablet (75 mg total) by mouth daily. 90 tablet 1   methocarbamol (ROBAXIN) 500 MG tablet Take 1 tablet (500 mg total) by mouth every 6 (six) hours as needed for muscle spasms. 40 tablet 0   nitroGLYCERIN (NITROSTAT) 0.4 MG SL tablet Place 1 tablet (0.4 mg total) under the tongue every 5 (five) minutes as needed for chest pain. 25 tablet 1   pantoprazole (PROTONIX) 40 MG tablet Take 1 tablet (40 mg total) by mouth daily. 90 tablet 2   rosuvastatin (CRESTOR) 20 MG tablet Take 1 tablet (20 mg total) by mouth daily. 90 tablet 1   amLODipine (NORVASC) 2.5 MG tablet Take 1 tablet (2.5 mg total) by mouth daily. 90 tablet 3   LORazepam (ATIVAN) 0.5 MG tablet Take 1 tablet (0.5 mg total) by mouth at bedtime as needed for anxiety (Do not drive for 8 hours after taking). 90 tablet 1   No current facility-administered medications for this visit.     Objective:  BP 130/76   Pulse (!) 56   Temp 98.4 F (36.9 C) (Temporal)   Resp 17   Ht '6\' 1"'$  (1.854 m)   Wt 206 lb 6.4 oz (93.6 kg)   SpO2 99%   BMI 27.23 kg/m  Gen: NAD, resting comfortably CV: RRR no murmurs rubs or gallops Lungs: CTAB no crackles, wheeze,  rhonchi Ext: no edema Skin: warm, dry Neuro: grossly normal, moves all extremities      Assessment and Plan  #CAD/hyperlipidemia -follows with Dr. Ellyn Hack of [facility] cardiology S: Patient with history of stents March 27, 2018 and August 04, 2016. Compliant with Plavix 75 mg.  Lipids have been controlled with LDL under 70 on rosuvastatin 20 mg  No chest pain or shortness of breath reported  Lab Results  Component Value Date   CHOL 144 03/15/2020   HDL 38 (L) 03/15/2020   LDLCALC 67 03/15/2020   LDLDIRECT 159.1 06/05/2011   TRIG 326 (H) 03/15/2020   CHOLHDL 3.8 03/15/2020  A/P: CAD remains asymptomatic-continue current medications.  For hyperlipidemia LDL was at goal of 70 or less at 66 last year   #recently saw  urology and they checked PSA- stable   #hypertension S: compliant with carvedilol 12.5 mg twice daily, amlodipine 2.5 mg  BP Readings from Last 3 Encounters:  04/05/21 130/76  11/24/20 126/78  05/27/20 128/70  A/P:  Controlled. Continue current medications.    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 -Ativan primarily for sleep-has racing thoughts before bed Recently patient reports working well for sleep recently. No falls in daytime. Would also use if was flying . No issues driving next day- knows to separate med use and driving by 8 hours A/P: Controlled. Continue current medications.     # Hyperglycemia/insulin resistance/prediabetes S: Exercise and diet-eats reasonably healthy diet. Gets 8-10,000 steps per day at least- right hip bothering him some  We started Metformin September 16, 2019 at 250 mg due to A1c elevations of 6.5 by phlebotomy and later 6.0 by point-of-care test to try to prevent diabetes-but had severe arthralgias and had to stop  Lab Results  Component Value Date   HGBA1C 6.1 (H) 03/15/2020   HGBA1C 6.0 (A) 09/16/2019   HGBA1C 6.5 05/02/2019  A/P: hopefully stable- didn't tolerate metformin- update a1c with labs today- continue to work on healthy eating/regular exercise   #History of DVT in 1980sx2-both were thought to be provoked-no recent leg swelling reported unilaterally  #Former smoker-quit smoking 2015 but 40 pack years. Enrolled in lung cancer screening program-next due November 2022  #COPD/centrilobular emphysema-noted on CT lung cancer screening. No shortness of breath. Does get some mucus in throat at times -Appears stable/no significant issues-continue to monitor   #colonoscopy done 2022 and Dr. Benson [place] released him to 10 years- at that point will be around 63 and we will reassess  Recommended follow up: physical planned in january Future Appointments  Date Time Provider [place]  08/24/2021 10:40 AM  Marin Olp, MD [facility]   Lab/Order associations:   ICD-10-CM   1. Essential hypertension  I10     2. Hyperglycemia  R73.9 HgB A1c    3. Hyperlipidemia, unspecified hyperlipidemia type  E78.5 CBC with Differential/Platelet    Comprehensive metabolic panel    Lipid panel    4. CAD S/P percutaneous coronary angioplasty  I25.10    Z98.61     5. Nocturia  R35.1     6. Need for influenza vaccination  Z23 Flu Vaccine QUAD High Dose(Fluad)      Meds ordered this encounter  Medications   LORazepam (ATIVAN) 0.5 MG tablet    Sig: Take 1 tablet (0.5 mg total) by mouth at bedtime as needed for anxiety (Do not drive for 8 hours after taking).    Dispense:  90 tablet    Refill:  1   amLODipine (NORVASC) 2.5 MG tablet    Sig: Take 1 tablet (2.5 mg total) by mouth daily.    Dispense:  90 tablet    Refill:  3    I,Harris Phan,acting as a scribe for Garret Reddish, MD.,have documented all relevant documentation on the behalf of Garret Reddish, MD,as directed by  Garret Reddish, MD while in the presence of Garret Reddish, MD.  I, Garret Reddish, MD, have reviewed all documentation for this visit. The documentation on 04/05/21 for the exam, diagnosis, procedures, and orders are all accurate and complete.   Return precautions advised.  Garret Reddish, MD

## 2021-04-05 NOTE — Patient Instructions (Addendum)
Schedule a lab visit at the check out desk within 2 weeks. Return for future fasting labs meaning nothing but water after midnight please. Ok to take your medications with water.   Flu shot today before you leave.  I suggest waiting for the new variant specific COVID-19 vaccination.  Recommended follow up: keep January physical

## 2021-04-09 DIAGNOSIS — M25562 Pain in left knee: Secondary | ICD-10-CM | POA: Diagnosis not present

## 2021-04-09 DIAGNOSIS — M1712 Unilateral primary osteoarthritis, left knee: Secondary | ICD-10-CM | POA: Diagnosis not present

## 2021-04-19 ENCOUNTER — Other Ambulatory Visit: Payer: Self-pay

## 2021-04-19 ENCOUNTER — Other Ambulatory Visit (INDEPENDENT_AMBULATORY_CARE_PROVIDER_SITE_OTHER): Payer: PPO

## 2021-04-19 DIAGNOSIS — R739 Hyperglycemia, unspecified: Secondary | ICD-10-CM | POA: Diagnosis not present

## 2021-04-19 DIAGNOSIS — E785 Hyperlipidemia, unspecified: Secondary | ICD-10-CM

## 2021-04-19 LAB — CBC WITH DIFFERENTIAL/PLATELET
Basophils Absolute: 0 10*3/uL (ref 0.0–0.1)
Basophils Relative: 0.6 % (ref 0.0–3.0)
Eosinophils Absolute: 0.2 10*3/uL (ref 0.0–0.7)
Eosinophils Relative: 3.3 % (ref 0.0–5.0)
HCT: 42.8 % (ref 39.0–52.0)
Hemoglobin: 14.1 g/dL (ref 13.0–17.0)
Lymphocytes Relative: 24.2 % (ref 12.0–46.0)
Lymphs Abs: 1.5 10*3/uL (ref 0.7–4.0)
MCHC: 33 g/dL (ref 30.0–36.0)
MCV: 90.7 fl (ref 78.0–100.0)
Monocytes Absolute: 0.8 10*3/uL (ref 0.1–1.0)
Monocytes Relative: 13.4 % — ABNORMAL HIGH (ref 3.0–12.0)
Neutro Abs: 3.7 10*3/uL (ref 1.4–7.7)
Neutrophils Relative %: 58.5 % (ref 43.0–77.0)
Platelets: 171 10*3/uL (ref 150.0–400.0)
RBC: 4.72 Mil/uL (ref 4.22–5.81)
RDW: 13.8 % (ref 11.5–15.5)
WBC: 6.3 10*3/uL (ref 4.0–10.5)

## 2021-04-19 LAB — COMPREHENSIVE METABOLIC PANEL
ALT: 13 U/L (ref 0–53)
AST: 15 U/L (ref 0–37)
Albumin: 3.9 g/dL (ref 3.5–5.2)
Alkaline Phosphatase: 49 U/L (ref 39–117)
BUN: 19 mg/dL (ref 6–23)
CO2: 30 mEq/L (ref 19–32)
Calcium: 8.8 mg/dL (ref 8.4–10.5)
Chloride: 102 mEq/L (ref 96–112)
Creatinine, Ser: 1 mg/dL (ref 0.40–1.50)
GFR: 78.61 mL/min (ref >60.00)
Glucose, Bld: 124 mg/dL — ABNORMAL HIGH (ref 70–99)
Potassium: 4.5 mEq/L (ref 3.5–5.1)
Sodium: 138 mEq/L (ref 135–145)
Total Bilirubin: 0.6 mg/dL (ref 0.2–1.2)
Total Protein: 6.8 g/dL (ref 6.0–8.3)

## 2021-04-19 LAB — LIPID PANEL
Cholesterol: 136 mg/dL (ref 0–200)
HDL: 41.5 mg/dL (ref >39.00)
LDL Cholesterol: 66 mg/dL (ref 0–99)
NonHDL: 94.68
Total CHOL/HDL Ratio: 3
Triglycerides: 143 mg/dL (ref 0.0–149.0)
VLDL: 28.6 mg/dL (ref 0.0–40.0)

## 2021-04-19 LAB — HEMOGLOBIN A1C: Hgb A1c MFr Bld: 6.8 % — ABNORMAL HIGH (ref 4.6–6.5)

## 2021-05-25 DIAGNOSIS — M545 Low back pain, unspecified: Secondary | ICD-10-CM | POA: Diagnosis not present

## 2021-05-31 DIAGNOSIS — M546 Pain in thoracic spine: Secondary | ICD-10-CM | POA: Diagnosis not present

## 2021-07-15 ENCOUNTER — Other Ambulatory Visit: Payer: Self-pay | Admitting: *Deleted

## 2021-07-15 DIAGNOSIS — Z87891 Personal history of nicotine dependence: Secondary | ICD-10-CM

## 2021-07-19 ENCOUNTER — Other Ambulatory Visit: Payer: Self-pay | Admitting: Family Medicine

## 2021-07-24 ENCOUNTER — Other Ambulatory Visit: Payer: Self-pay

## 2021-07-24 ENCOUNTER — Encounter (HOSPITAL_BASED_OUTPATIENT_CLINIC_OR_DEPARTMENT_OTHER): Payer: Self-pay

## 2021-07-24 ENCOUNTER — Emergency Department (HOSPITAL_BASED_OUTPATIENT_CLINIC_OR_DEPARTMENT_OTHER)
Admission: EM | Admit: 2021-07-24 | Discharge: 2021-07-24 | Disposition: A | Discharge status: Home / Self Care | Payer: PPO | Attending: Emergency Medicine | Admitting: Emergency Medicine

## 2021-07-24 DIAGNOSIS — J449 Chronic obstructive pulmonary disease, unspecified: Secondary | ICD-10-CM | POA: Diagnosis not present

## 2021-07-24 DIAGNOSIS — Z96642 Presence of left artificial hip joint: Secondary | ICD-10-CM | POA: Diagnosis not present

## 2021-07-24 DIAGNOSIS — Z7901 Long term (current) use of anticoagulants: Secondary | ICD-10-CM | POA: Diagnosis not present

## 2021-07-24 DIAGNOSIS — Z87891 Personal history of nicotine dependence: Secondary | ICD-10-CM | POA: Insufficient documentation

## 2021-07-24 DIAGNOSIS — Z8616 Personal history of COVID-19: Secondary | ICD-10-CM | POA: Diagnosis not present

## 2021-07-24 DIAGNOSIS — Z79899 Other long term (current) drug therapy: Secondary | ICD-10-CM | POA: Insufficient documentation

## 2021-07-24 DIAGNOSIS — I10 Essential (primary) hypertension: Secondary | ICD-10-CM | POA: Insufficient documentation

## 2021-07-24 DIAGNOSIS — K219 Gastro-esophageal reflux disease without esophagitis: Secondary | ICD-10-CM | POA: Diagnosis not present

## 2021-07-24 DIAGNOSIS — R0781 Pleurodynia: Secondary | ICD-10-CM | POA: Diagnosis present

## 2021-07-24 DIAGNOSIS — I2510 Atherosclerotic heart disease of native coronary artery without angina pectoris: Secondary | ICD-10-CM | POA: Insufficient documentation

## 2021-07-24 DIAGNOSIS — R0789 Other chest pain: Secondary | ICD-10-CM | POA: Diagnosis not present

## 2021-07-24 MED ORDER — METHOCARBAMOL 500 MG PO TABS: 500.0000 mg | ORAL_TABLET | Freq: Two times a day (BID) | ORAL | 0 refills | Status: DC

## 2021-07-24 MED ORDER — KETOROLAC TROMETHAMINE 30 MG/ML IJ SOLN
30.0000 mg | Freq: Once | INTRAMUSCULAR | Status: AC
  Administered 2021-07-24: 12:00:00 30 mg via INTRAMUSCULAR
  Filled 2021-07-24: qty 1

## 2021-07-24 NOTE — ED Triage Notes (Signed)
Pt states he was at home trying to manually lift his garage door which was frozen and injured the L side of his rib cage due to the strain. Denies trauma. No other injury noted per pt.

## 2021-07-24 NOTE — ED Provider Notes (Signed)
[facility] EMERGENCY DEPT Provider Note   CSN: [id number] Arrival date & time: 07/24/21  1128     History Chief Complaint  Patient presents with   Rib Injury    Terry Wright is a 65 y.o. male who presents the emergency department complaining of left-sided rib pain.  Patient states that yesterday morning, he was trying to manually lift his garage door which was frozen shut.  He states that he felt an abrupt onset of left chest wall pain.  He denies hearing any "pop".  He denies any other trauma.  He states his pain is made worse with bending and reaching movements, no change with breathing or exertion.  HPI     Past Medical History:  Diagnosis Date   Anginal pain ([place]) 2018   Anxiety    Arthritis    shoulders hips,    CAD S/P percutaneous coronary angioplasty 07/2016, 02/2018   a) 100% pCx -- DES PCI with 4.0 x 16 mm Synergy DES. 65% ostial OM 3 (relatively small caliber - Med Rx);; b) 03/27/18 PCI/DES to dLCX/OM3 x1. Patent pCX DES. 70% dRCA trifurcation lesion (Med Rx). Normal EF   COPD (chronic obstructive pulmonary disease) ([place]) 2018   mild   DVT (deep venous thrombosis) (HCC)    Right upper arm DVT on 2 occasions in the remote past   Dysrhythmia    V tac   Ejection fraction    LV function normal, echo, February, 2010   GERD (gastroesophageal reflux disease)    Hyperlipidemia    Hypertension    Mitral regurgitation    Mild, echo, February, 2010   Palpitations    Mild in the past   Peripheral vascular disease ([place]) 1980s   DVT   Pre-diabetes    Statin intolerance    Felt poorly after Lipitor, Crestor, TriCor   STEMI involving left circumflex coronary artery ([place]) 08/04/2016   Occluded very large caliber codominant circumflex - PCI with single Synergy DES   Tobacco abuse     Patient Active Problem List   Diagnosis Date Noted   COVID-19 virus infection 12/17/2020   Left hip OA 04/08/2020   S/P left THA, AA 04/08/2020   Unstable angina  (HCC)    Centrilobular emphysema ([place]) 09/04/2017   Aortic atherosclerosis ([place]) 09/04/2017   Acute medial meniscus tear of right knee 04/04/2017   Chondromalacia, right knee 04/04/2017   Ischemic cardiomyopathy 10/12/2016   CAD S/P percutaneous coronary angioplasty 09/21/2016   Insomnia 09/21/2016   Hyperglycemia 09/21/2016   STEMI involving left circumflex coronary artery ([place]) -complicated by cardiac arrest 08/04/2016   History of DVT (deep vein thrombosis)    Dyslipidemia, goal LDL below 50    GERD (gastroesophageal reflux disease)    Essential hypertension    Ventricular bigeminy    Former smoker     Past Surgical History:  Procedure Laterality Date   CARDIAC CATHETERIZATION N/A 08/04/2016   Procedure: Left Heart Cath and Coronary Angiography;  Surgeon: Leonie Man, MD;  Location: [facility];  Service: Cardiovascular;  Laterality: N/A: 100% pCx --> PCI. residual 65% pOM3 (small).  ~EF 35-45%  with lateral HK.   CARDIAC CATHETERIZATION N/A 08/04/2016   Procedure: CORONARY STENT INTERVENTION;  Surgeon: Leonie Man, MD;  Location: [facility];  Service: Cardiovascular;  Laterality: N/A: pCx 100%-0%: Synergy DES 4.0 x 16 (4.6 mm)   CHONDROPLASTY Right 04/04/2017   Procedure: CHONDROPLASTY;  Surgeon: Dorna Leitz, MD;  Location: [facility];  Service: Orthopedics;  Laterality: Right;   CORONARY STENT INTERVENTION N/A 03/27/2018   Procedure: CORONARY STENT INTERVENTION;  Surgeon: Martinique, Peter M, MD;  Location: [facility];  Service: Cardiovascular:: dCx-OM3 95%-0%: DES PCI - Synergy 2.25 x 16 mm.   GANGLION CYST EXCISION     oct 2017   HERNIA REPAIR     umbilical hernia 2017 oct   KNEE ARTHROSCOPY Right 04/04/2017   Procedure: ARTHROSCOPY RIGHT KNEE, WITH MEIDAL FEMORAL CONDYLE PATELLAR MEDIAL FEMORAL JOINT PLICA EXCISION ;  Surgeon: Dorna Leitz, MD;  Location: [facility];  Service: Orthopedics;  Laterality: Right;   LEFT HEART CATH AND CORONARY ANGIOGRAPHY N/A  03/27/2018   Procedure: LEFT HEART CATH AND CORONARY ANGIOGRAPHY;  Surgeon: Martinique, Peter M, MD;  Location: [facility];  Service: Cardiovascular:   2 vessel obstructive CAD: - 95% dLCx/OM3 --Successful PCI of the dLCx/OM3 with SYNERGY DES 2.25X16, -70% distal RCA at trifurcation (Med Management).  Widely patent pLCx DES. Low normal LV function (50-55%), Normal LVEDP    TOTAL HIP ARTHROPLASTY Left 04/08/2020   Procedure: TOTAL HIP ARTHROPLASTY ANTERIOR APPROACH;  Surgeon: Paralee Cancel, MD;  Location: [facility];  Service: Orthopedics;  Laterality: Left;  70 mins   TRANSTHORACIC ECHOCARDIOGRAM  08/05/2016   post STEMI: EF 40-45%. Mild concentric LVH. Severe HK of basal-mid inferolateral wall consistent with infarct in this distribution. GR 1 DD   TRANSTHORACIC ECHOCARDIOGRAM  01/11/2017   EF 50-55%. GR 1 DD. Mild to moderate LA dilation.       Family History  Problem Relation Age of Onset   Heart attack Mother        x2- lived into 3s   Hypertension Father        lived into 45s   Other Brother        killed in [place]    Social History   Tobacco Use   Smoking status: Former    Packs/day: 1.00    Years: 43.00    Pack years: 43.00    Types: Cigarettes    Quit date: 2015    Years since quitting: 7.9   Smokeless tobacco: Never   Tobacco comments:    Former smoker quit 2015  Vaping Use   Vaping Use: Never used  Substance Use Topics   Alcohol use: Yes    Alcohol/week: 7.0 standard drinks    Types: 7 Glasses of wine per week    Comment: occasional   Drug use: No    Home Medications Prior to Admission medications   Medication Sig Start Date End Date Taking? Authorizing Provider  methocarbamol (ROBAXIN) 500 MG tablet Take 1 tablet (500 mg total) by mouth 2 (two) times daily. 07/24/21  Yes Valicia Rief T, PA-C  amLODipine (NORVASC) 2.5 MG tablet Take 1 tablet (2.5 mg total) by mouth daily. 04/05/21   Marin Olp, MD  carvedilol (COREG) 12.5 MG tablet Take 1 tablet  (12.5 mg total) by mouth 2 (two) times daily. 11/01/20   Leonie Man, MD  clopidogrel (PLAVIX) 75 MG tablet Take 1 tablet (75 mg total) by mouth daily. 11/01/20   Leonie Man, MD  LORazepam (ATIVAN) 0.5 MG tablet TAKE 1 TABLET BY MOUTH AT BEDTIME AS NEEDED FOR ANXIETY (DO NOT DRIVE FOR 8 HOURS AFTER TAKING) 07/19/21   Marin Olp, MD  nitroGLYCERIN (NITROSTAT) 0.4 MG SL tablet Place 1 tablet (0.4 mg total) under the tongue every 5 (five) minutes as needed for chest pain. 10/11/20  Marin Olp, MD  pantoprazole (PROTONIX) 40 MG tablet Take 1 tablet (40 mg total) by mouth daily. 01/17/21   Leonie Man, MD  rosuvastatin (CRESTOR) 20 MG tablet Take 1 tablet (20 mg total) by mouth daily. 11/01/20   Leonie Man, MD    Allergies    Metformin and related  Review of Systems   Review of Systems  Constitutional:  Negative for chills and fever.  HENT:  Negative for congestion.   Respiratory:  Negative for cough, shortness of breath and wheezing.   Cardiovascular:  Positive for chest pain.  Gastrointestinal:  Negative for abdominal pain.  All other systems reviewed and are negative.  Physical Exam Updated Vital Signs BP (!) 151/82 (BP Location: Right Arm)    Pulse 60    Temp 99.1 F (37.3 C) (Temporal)    Resp 16    Ht 6\' 1"  (1.854 m)    Wt 93 kg    SpO2 99%    BMI 27.05 kg/m   Physical Exam Vitals and nursing note reviewed.  Constitutional:      Appearance: Normal appearance.  HENT:     Head: Normocephalic and atraumatic.  Eyes:     Conjunctiva/sclera: Conjunctivae normal.  Cardiovascular:     Rate and Rhythm: Normal rate and regular rhythm.  Pulmonary:     Effort: Pulmonary effort is normal. No respiratory distress.     Breath sounds: Normal breath sounds.  Chest:     Comments: Tenderness to palpation of the left anterior chest wall, without bruising or deformity.  Chest wall stable. Skin:    General: Skin is warm and dry.  Neurological:     Mental Status: He  is alert.  Psychiatric:        Mood and Affect: Mood normal.        Behavior: Behavior normal.    ED Results / Procedures / Treatments   Labs (all labs ordered are listed, but only abnormal results are displayed) Labs Reviewed - No data to display  EKG None  Radiology No results found.  Procedures Procedures   Medications Ordered in ED Medications  ketorolac (TORADOL) 30 MG/ML injection 30 mg (30 mg Intramuscular Given 07/24/21 1228)    ED Course  I have reviewed the triage vital signs and the nursing notes.  Pertinent labs & imaging results that were available during my care of the patient were reviewed by me and considered in my medical decision making (see chart for details).    MDM Rules/Calculators/A&P                          Patient is a 65 year old male presents to the emergency department complaining of left-sided chest pain after manually lifting his garage door yesterday morning.  On my exam patient is afebrile, not tachycardic, not hypoxic, no acute distress. There is  tenderness to palpation over his left anterior chest wall.  Chest wall stable and there is no bruising or deformities noted.  Lung sounds are clear to auscultation in all fields.  His pain is not made worse with exertion or breathing, and I have low suspicion for acute cardiopulmonary abnormality or rib fracture.  It is likely his symptoms are related to a muscular strain.  He states he had improvement in his pain after Toradol was given in triage.  He is not requiring admission or inpatient treatment for his symptoms at this time, and he stable for discharge at home.  Will treat with over-the-counter medications and muscle relaxer.  Patient given return precautions, and patient agreeable to plan.  Final Clinical Impression(s) / ED Diagnoses Final diagnoses:  Left-sided chest wall pain    Rx / DC Orders ED Discharge Orders          Ordered    methocarbamol (ROBAXIN) 500 MG tablet  2 times  daily        07/24/21 1410           Portions of this report may have been transcribed using voice recognition software. Every effort was made to ensure accuracy; however, inadvertent computerized transcription errors may be present.    Estill Cotta 07/24/21 1424    Regan Lemming, MD 07/24/21 2026

## 2021-07-24 NOTE — Discharge Instructions (Addendum)
You were seen in the emergency department today for rib injury.  As we discussed I think it is very unlikely that you have fractured your rib.  I think that you strained a muscle on your chest wall.  I normally treat this with over-the-counter medications like Tylenol or Aleve.  Also prescribing you a muscle relaxer called Robaxin, that you can take twice daily for the next several days.  Continue to monitor how you're doing and return to the ER for new or worsening symptoms such as bruising on your chest, or difficulty breathing.   It has been a pleasure seeing and caring for you today and I hope you start feeling better soon!

## 2021-07-29 ENCOUNTER — Ambulatory Visit (INDEPENDENT_AMBULATORY_CARE_PROVIDER_SITE_OTHER)
Admission: RE | Admit: 2021-07-29 | Discharge: 2021-07-29 | Disposition: A | Discharge status: Home / Self Care | Payer: PPO | Source: Ambulatory Visit | Attending: Acute Care | Admitting: Acute Care

## 2021-07-29 ENCOUNTER — Other Ambulatory Visit: Payer: Self-pay

## 2021-07-29 DIAGNOSIS — Z87891 Personal history of nicotine dependence: Secondary | ICD-10-CM | POA: Diagnosis not present

## 2021-08-03 ENCOUNTER — Encounter: Payer: Self-pay | Admitting: *Deleted

## 2021-08-03 ENCOUNTER — Other Ambulatory Visit: Payer: Self-pay | Admitting: Acute Care

## 2021-08-03 DIAGNOSIS — Z87891 Personal history of nicotine dependence: Secondary | ICD-10-CM

## 2021-08-04 ENCOUNTER — Ambulatory Visit (INDEPENDENT_AMBULATORY_CARE_PROVIDER_SITE_OTHER): Payer: PPO | Admitting: Family Medicine

## 2021-08-04 ENCOUNTER — Other Ambulatory Visit: Payer: Self-pay

## 2021-08-04 ENCOUNTER — Encounter: Payer: Self-pay | Admitting: Family Medicine

## 2021-08-04 VITALS — BP 130/70 | HR 70 | Temp 99.4°F | Ht 73.0 in | Wt 210.1 lb

## 2021-08-04 DIAGNOSIS — M940 Chondrocostal junction syndrome [Tietze]: Secondary | ICD-10-CM

## 2021-08-04 MED ORDER — TRAMADOL HCL 50 MG PO TABS: 50.0000 mg | ORAL_TABLET | Freq: Four times a day (QID) | ORAL | 0 refills | Status: AC | PRN

## 2021-08-04 NOTE — Progress Notes (Signed)
Subjective:     Patient ID: Terry Wright, male    DOB: [birth year], 66 y.o.   MRN: [id number]  Chief Complaint  Patient presents with   Pain in ribs    Pain in ribs still not getting any better    HPI On 12/25 seen in ER.  [employer] cut power on 12/24 so went out to garage and lifted garage door and "pulled" on spot on L side-severe pain-intermitt-went to er on 12/25.  Got muscle relaxor and tylenol No SOB. Not worse, but not better. No cough.   Health Maintenance Due  Topic Date Due   Pneumonia Vaccine 17+ Years old (1 - PCV) Never done   COVID-19 Vaccine (4 - Booster for [place] series) 07/05/2020    Past Medical History:  Diagnosis Date   Anginal pain ([place]) 2018   Anxiety    Arthritis    shoulders hips,    CAD S/P percutaneous coronary angioplasty 07/2016, 02/2018   a) 100% pCx -- DES PCI with 4.0 x 16 mm Synergy DES. 65% ostial OM 3 (relatively small caliber - Med Rx);; b) 03/27/18 PCI/DES to dLCX/OM3 x1. Patent pCX DES. 70% dRCA trifurcation lesion (Med Rx). Normal EF   COPD (chronic obstructive pulmonary disease) ([place]) 2018   mild   DVT (deep venous thrombosis) (HCC)    Right upper arm DVT on 2 occasions in the remote past   Dysrhythmia    V tac   Ejection fraction    LV function normal, echo, February, 2010   GERD (gastroesophageal reflux disease)    Hyperlipidemia    Hypertension    Mitral regurgitation    Mild, echo, February, 2010   Palpitations    Mild in the past   Peripheral vascular disease ([place]) 1980s   DVT   Pre-diabetes    Statin intolerance    Felt poorly after Lipitor, Crestor, TriCor   STEMI involving left circumflex coronary artery ([place]) 08/04/2016   Occluded very large caliber codominant circumflex - PCI with single Synergy DES   Tobacco abuse     Past Surgical History:  Procedure Laterality Date   CARDIAC CATHETERIZATION N/A 08/04/2016   Procedure: Left Heart Cath and Coronary Angiography;  Surgeon: Leonie Man, MD;   Location: [facility];  Service: Cardiovascular;  Laterality: N/A: 100% pCx --> PCI. residual 65% pOM3 (small).  ~EF 35-45%  with lateral HK.   CARDIAC CATHETERIZATION N/A 08/04/2016   Procedure: CORONARY STENT INTERVENTION;  Surgeon: Leonie Man, MD;  Location: [facility];  Service: Cardiovascular;  Laterality: N/A: pCx 100%-0%: Synergy DES 4.0 x 16 (4.6 mm)   CHONDROPLASTY Right 04/04/2017   Procedure: CHONDROPLASTY;  Surgeon: Dorna Leitz, MD;  [facility];  Service: Orthopedics;  Laterality: Right;   CORONARY STENT INTERVENTION N/A 03/27/2018   Procedure: CORONARY STENT INTERVENTION;  Surgeon: Martinique, Peter M, MD;  Location: [facility];  Service: Cardiovascular:: dCx-OM3 95%-0%: DES PCI - Synergy 2.25 x 16 mm.   GANGLION CYST EXCISION     oct 2017   HERNIA REPAIR     umbilical hernia 2017 oct   KNEE ARTHROSCOPY Right 04/04/2017   Procedure: ARTHROSCOPY RIGHT KNEE, WITH MEIDAL FEMORAL CONDYLE PATELLAR MEDIAL FEMORAL JOINT PLICA EXCISION ;  Surgeon: Dorna Leitz, MD;  [facility];  Service: Orthopedics;  Laterality: Right;   LEFT HEART CATH AND CORONARY ANGIOGRAPHY N/A 03/27/2018   Procedure: LEFT HEART CATH AND CORONARY ANGIOGRAPHY;  Surgeon: Martinique, Peter M,  MD;  Location: [facility];  Service: Cardiovascular:   2 vessel obstructive CAD: - 95% dLCx/OM3 --Successful PCI of the dLCx/OM3 with SYNERGY DES 2.25X16, -70% distal RCA at trifurcation (Med Management).  Widely patent pLCx DES. Low normal LV function (50-55%), Normal LVEDP    TOTAL HIP ARTHROPLASTY Left 04/08/2020   Procedure: TOTAL HIP ARTHROPLASTY ANTERIOR APPROACH;  Surgeon: Paralee Cancel, MD;  [facility];  Service: Orthopedics;  Laterality: Left;  70 mins   TRANSTHORACIC ECHOCARDIOGRAM  08/05/2016   post STEMI: EF 40-45%. Mild concentric LVH. Severe HK of basal-mid inferolateral wall consistent with infarct in this distribution. GR 1 DD   TRANSTHORACIC ECHOCARDIOGRAM  01/11/2017   EF 50-55%.  GR 1 DD. Mild to moderate LA dilation.    Outpatient Medications Prior to Visit  Medication Sig Dispense Refill   amLODipine (NORVASC) 2.5 MG tablet Take 1 tablet (2.5 mg total) by mouth daily. 90 tablet 3   carvedilol (COREG) 12.5 MG tablet Take 1 tablet (12.5 mg total) by mouth 2 (two) times daily. 180 tablet 1   clopidogrel (PLAVIX) 75 MG tablet Take 1 tablet (75 mg total) by mouth daily. 90 tablet 1   cyclobenzaprine (FLEXERIL) 10 MG tablet cyclobenzaprine 10 mg tablet     LORazepam (ATIVAN) 0.5 MG tablet TAKE 1 TABLET BY MOUTH AT BEDTIME AS NEEDED FOR ANXIETY (DO NOT DRIVE FOR 8 HOURS AFTER TAKING) 90 tablet 1   methocarbamol (ROBAXIN) 500 MG tablet Take 1 tablet (500 mg total) by mouth 2 (two) times daily. 20 tablet 0   nitroGLYCERIN (NITROSTAT) 0.4 MG SL tablet Place 1 tablet (0.4 mg total) under the tongue every 5 (five) minutes as needed for chest pain. 25 tablet 1   pantoprazole (PROTONIX) 40 MG tablet Take 1 tablet (40 mg total) by mouth daily. 90 tablet 2   rosuvastatin (CRESTOR) 20 MG tablet Take 1 tablet (20 mg total) by mouth daily. 90 tablet 1   triamcinolone lotion (KENALOG) 0.1 % Apply 1 application topically 2 (two) times daily. Mix lotion with cream and apply twice daily to affected area     triamcinolone cream (KENALOG) 0.1 % triamcinolone acetonide 0.1 % topical cream  MIX WITH LOTION AND APPLY TWICE DAILY FOR BITES AND ITCH ON LEGS     triamcinolone cream (KENALOG) 0.1 % Apply topically 2 (two) times daily.     No facility-administered medications prior to visit.    Allergies  Allergen Reactions   Metformin     Other reaction(s): Other (See Comments) After starting metformin- Severe hip pain bilaterally worse on left. Could not turn neck. Back pain. Symptoms drastically improved off metformin   Metformin And Related     After starting metformin- Severe hip pain bilaterally worse on left. Could not turn neck. Back pain. Symptoms drastically improved off metformin    ZES:[id number] except as noted in HPI      Objective:     BP 130/70    Pulse 70    Temp 99.4 F (37.4 C) (Temporal)    Ht 6\' 1"  (1.854 m)    Wt 210 lb 2 oz (95.3 kg)    SpO2 96%    BMI 27.72 kg/m  Wt Readings from Last 3 Encounters:  08/04/21 210 lb 2 oz (95.3 kg)  07/24/21 205 lb (93 kg)  04/05/21 206 lb 6.4 oz (93.6 kg)        Gen: WDWN NAD HEENT: NCAT, conjunctiva not injected, sclera nonicteric NECK:  supple, no thyromegaly, no  nodes, no carotid bruits CARDIAC: RRR, S1S2+, no murmur. DP 2+B LUNGS: CTAB. No wheezes Tender L lower ribs more towards axillary line.  ABDOMEN:  BS+, soft, NTND, No HSM, no masses EXT:  no edema MSK: no gross abnormalities.  NEURO: A&O x3.  CN II-XII intact.  PSYCH: normal mood. Good eye contact  Assessment & Plan:   Problem List Items Addressed This Visit   None Visit Diagnoses     Acute costochondritis    -  Primary      Rib pain-strain.  Poss fx.  Doesn't want x-ray.  Just meds for pain.  Discussed may take 6 wks to heal or longer.  Will do tramadol.  Aware not to take lorazepam.  Can do tylenol as well.    Meds ordered this encounter  Medications   traMADol (ULTRAM) 50 MG tablet    Sig: Take 1 tablet (50 mg total) by mouth every 6 (six) hours as needed for up to 10 days.    Dispense:  40 tablet    Refill:  0    Wellington Hampshire, MD

## 2021-08-04 NOTE — Patient Instructions (Signed)
Meds have been sent the the pharmacy You can take tylenol for pain If worsening symptoms, let us know or go to the Emergency room   Don't take lorazepam

## 2021-08-05 ENCOUNTER — Other Ambulatory Visit: Payer: Self-pay

## 2021-08-17 NOTE — Progress Notes (Signed)
Phone: [phone]   Subjective:  Patient presents today for their annual physical. Chief complaint-noted.   See problem oriented charting- Review of Systems  Constitutional:  Negative for chills and fever.  HENT:  Negative for congestion, nosebleeds and sinus pain.   Eyes:  Positive for blurred vision. Negative for double vision.  Respiratory:  Positive for sputum production (mucus in throat). Negative for cough and shortness of breath.   Cardiovascular:  Positive for chest pain (after pulling injury). Negative for palpitations.  Gastrointestinal:  Negative for constipation, diarrhea, heartburn, nausea and vomiting.  Genitourinary:  Positive for frequency. Negative for dysuria.  Musculoskeletal:  Positive for myalgias (over ribs with injury). Negative for back pain and neck pain.  Skin:  Negative for itching and rash.  Neurological:  Positive for dizziness and headaches.  Endo/Heme/Allergies:  Positive for polydipsia. Bruises/bleeds easily (on plavix).  Psychiatric/Behavioral:  Negative for depression and suicidal ideas.    The following were reviewed and entered/updated in epic: Past Medical History:  Diagnosis Date   Anginal pain ([place]) 2018   Anxiety    Arthritis    shoulders hips,    CAD S/P percutaneous coronary angioplasty 07/2016, 02/2018   a) 100% pCx -- DES PCI with 4.0 x 16 mm Synergy DES. 65% ostial OM 3 (relatively small caliber - Med Rx);; b) 03/27/18 PCI/DES to dLCX/OM3 x1. Patent pCX DES. 70% dRCA trifurcation lesion (Med Rx). Normal EF   COPD (chronic obstructive pulmonary disease) ([place]) 2018   mild   DVT (deep venous thrombosis) (HCC)    Right upper arm DVT on 2 occasions in the remote past   Dysrhythmia    V tac   Ejection fraction    LV function normal, echo, February, 2010   GERD (gastroesophageal reflux disease)    Hyperlipidemia    Hypertension    Mitral regurgitation    Mild, echo, February, 2010   Palpitations    Mild in the past   Peripheral  vascular disease ([place]) 1980s   DVT   Pre-diabetes    Statin intolerance    Felt poorly after Lipitor, Crestor, TriCor   STEMI involving left circumflex coronary artery ([place]) 08/04/2016   Occluded very large caliber codominant circumflex - PCI with single Synergy DES   Tobacco abuse    Patient Active Problem List   Diagnosis Date Noted   CAD S/P percutaneous coronary angioplasty 09/21/2016    Priority: High   Insomnia 09/21/2016    Priority: High   Diabetes mellitus with cardiac complication ([place]) 51/76/1607    Priority: High   STEMI involving left circumflex coronary artery ([place]) -complicated by cardiac arrest 08/04/2016    Priority: High   History of DVT (deep vein thrombosis)     Priority: High   Former smoker     Priority: High   Centrilobular emphysema ([place]) 09/04/2017    Priority: Medium    Ischemic cardiomyopathy 10/12/2016    Priority: Medium    Dyslipidemia, goal LDL below 50     Priority: Medium    Essential hypertension     Priority: Medium    Unstable angina ([place])     Priority: Low   Acute medial meniscus tear of right knee 04/04/2017    Priority: Low   GERD (gastroesophageal reflux disease)     Priority: Low   Ventricular bigeminy     Priority: Low   COVID-19 virus infection 12/17/2020   Left hip OA 04/08/2020   S/P left THA, AA 04/08/2020   Chondromalacia,  right knee 04/04/2017   Past Surgical History:  Procedure Laterality Date   CARDIAC CATHETERIZATION N/A 08/04/2016   Procedure: Left Heart Cath and Coronary Angiography;  Surgeon: Leonie Man, MD;  Location: [facility];  Service: Cardiovascular;  Laterality: N/A: 100% pCx --> PCI. residual 65% pOM3 (small).  ~EF 35-45%  with lateral HK.   CARDIAC CATHETERIZATION N/A 08/04/2016   Procedure: CORONARY STENT INTERVENTION;  Surgeon: Leonie Man, MD;  Location: [facility];  Service: Cardiovascular;  Laterality: N/A: pCx 100%-0%: Synergy DES 4.0 x 16 (4.6 mm)   CHONDROPLASTY Right 04/04/2017    Procedure: CHONDROPLASTY;  Surgeon: Dorna Leitz, MD;  [facility];  Service: Orthopedics;  Laterality: Right;   CORONARY STENT INTERVENTION N/A 03/27/2018   Procedure: CORONARY STENT INTERVENTION;  Surgeon: Martinique, Peter M, MD;  Location: [facility];  Service: Cardiovascular:: dCx-OM3 95%-0%: DES PCI - Synergy 2.25 x 16 mm.   GANGLION CYST EXCISION     oct 2017   HERNIA REPAIR     umbilical hernia 2017 oct   KNEE ARTHROSCOPY Right 04/04/2017   Procedure: ARTHROSCOPY RIGHT KNEE, WITH MEIDAL FEMORAL CONDYLE PATELLAR MEDIAL FEMORAL JOINT PLICA EXCISION ;  Surgeon: Dorna Leitz, MD;  [facility];  Service: Orthopedics;  Laterality: Right;   LEFT HEART CATH AND CORONARY ANGIOGRAPHY N/A 03/27/2018   Procedure: LEFT HEART CATH AND CORONARY ANGIOGRAPHY;  Surgeon: Martinique, Peter M, MD;  Location: [facility];  Service: Cardiovascular:   2 vessel obstructive CAD: - 95% dLCx/OM3 --Successful PCI of the dLCx/OM3 with SYNERGY DES 2.25X16, -70% distal RCA at trifurcation (Med Management).  Widely patent pLCx DES. Low normal LV function (50-55%), Normal LVEDP    TOTAL HIP ARTHROPLASTY Left 04/08/2020   Procedure: TOTAL HIP ARTHROPLASTY ANTERIOR APPROACH;  Surgeon: Paralee Cancel, MD;  [facility];  Service: Orthopedics;  Laterality: Left;  70 mins   TRANSTHORACIC ECHOCARDIOGRAM  08/05/2016   post STEMI: EF 40-45%. Mild concentric LVH. Severe HK of basal-mid inferolateral wall consistent with infarct in this distribution. GR 1 DD   TRANSTHORACIC ECHOCARDIOGRAM  01/11/2017   EF 50-55%. GR 1 DD. Mild to moderate LA dilation.    Family History  Problem Relation Age of Onset   Heart attack Mother        x2- lived into 89s   Hypertension Father        lived into 9s   Other Brother        killed in [place]    Medications- reviewed and updated Current Outpatient Medications  Medication Sig Dispense Refill   amLODipine (NORVASC) 2.5 MG tablet Take 1 tablet (2.5 mg total) by mouth  daily. 90 tablet 3   carvedilol (COREG) 12.5 MG tablet Take 1 tablet (12.5 mg total) by mouth 2 (two) times daily. 180 tablet 1   clopidogrel (PLAVIX) 75 MG tablet Take 1 tablet (75 mg total) by mouth daily. 90 tablet 1   cyclobenzaprine (FLEXERIL) 10 MG tablet cyclobenzaprine 10 mg tablet     LORazepam (ATIVAN) 0.5 MG tablet TAKE 1 TABLET BY MOUTH AT BEDTIME AS NEEDED FOR ANXIETY (DO NOT DRIVE FOR 8 HOURS AFTER TAKING) 90 tablet 1   nitroGLYCERIN (NITROSTAT) 0.4 MG SL tablet Place 1 tablet (0.4 mg total) under the tongue every 5 (five) minutes as needed for chest pain. 25 tablet 1   pantoprazole (PROTONIX) 40 MG tablet Take 1 tablet (40 mg total) by mouth daily. 90 tablet 2   rosuvastatin (  CRESTOR) 20 MG tablet Take 1 tablet (20 mg total) by mouth daily. 90 tablet 1   triamcinolone lotion (KENALOG) 0.1 % Apply 1 application topically 2 (two) times daily. Mix lotion with cream and apply twice daily to affected area     No current facility-administered medications for this visit.    Allergies-reviewed and updated Allergies  Allergen Reactions   Metformin     Other reaction(s): Other (See Comments) After starting metformin- Severe hip pain bilaterally worse on left. Could not turn neck. Back pain. Symptoms drastically improved off metformin   Metformin And Related     After starting metformin- Severe hip pain bilaterally worse on left. Could not turn neck. Back pain. Symptoms drastically improved off metformin    Social History   Social History Narrative   Married 10 years in 2018. 2 step kids- 25 and 62. 3 kids but he does not have contact with. No grandkids      Retired Theatre stage manager.    Wife working [place]      Hobbies: golf (needs shoulder replacement though, working in the yard, working out- was doing this regularly even before MI- walking/treadmill   Objective  Objective:  BP 130/82 (BP Location: Left Arm, Patient Position: Sitting, Cuff Size: Large)    Pulse 61    Temp  98.1 F (36.7 C) (Temporal)    Ht 6\' 1"  (1.854 m)    Wt 211 lb (95.7 kg)    SpO2 96%    BMI 27.84 kg/m  Gen: NAD, resting comfortably HEENT: Mucous membranes are moist. Oropharynx normal Neck: no thyromegaly CV: RRR no murmurs rubs or gallops Lungs: CTAB no crackles, wheeze, rhonchi Abdomen: soft/nontender/nondistended/normal bowel sounds. No rebound or guarding.  Ext: no edema Skin: warm, dry Neuro: grossly normal, moves all extremities, PERRLA  Diabetic Foot Exam - Simple   Simple Foot Form Diabetic Foot exam was performed with the following findings: Yes 08/24/2021 11:32 AM  Visual Inspection No deformities, no ulcerations, no other skin breakdown bilaterally: Yes Sensation Testing Intact to touch and monofilament testing bilaterally: Yes Pulse Check Posterior Tibialis and Dorsalis pulse intact bilaterally: Yes Comments       Assessment and Plan  66 y.o. male presenting for annual physical.  Health Maintenance counseling: 1. Anticipatory guidance: Patient counseled regarding regular dental exams -q6 months, eye exams --was told every 2 or 3 years,  avoiding smoking and second hand smoke , limiting alcohol to 2 beverages per day-1-2 glasses of wine.  No illicit drugs  2. Risk factor reduction:  Advised patient of need for regular exercise and diet rich and fruits and vegetables to reduce risk of heart attack and stroke.  Exercise-limited due to hip pain. Now limited after rib injury. Had been doing some walking prior to this- is going to start back Diet/weight management-weight pretty stable- reasonably healthy diet Wt Readings from Last 3 Encounters:  08/24/21 211 lb (95.7 kg)  08/04/21 210 lb 2 oz (95.3 kg)  07/24/21 205 lb (93 kg)  3. Immunizations/screenings/ancillary studies DISCUSSED:  -COVID booster vaccine #4- discussed at pharmacy -Prevnar-20 vaccine #1- recommended Prevnar 20 today- opts in   Immunization History  Administered Date(s) Administered   Fluad  Quad(high Dose 65+) 04/05/2021   Influenza,inj,Quad PF,6+ Mos 04/28/2019   Influenza-Unspecified 05/17/2016, 05/02/2017, 04/15/2018, 04/28/2019, 05/07/2020   PFIZER(Purple Top)SARS-COV-2 Vaccination 08/19/2019, 09/09/2019, 05/10/2020   Tdap 10/28/2015   Zoster Recombinat (Shingrix) 09/17/2017, 12/23/2017  4. Prostate cancer screening-  trend PSA, nocturia noted. Follows with urology  now for this. -check PSA today and forward to Dr. Milford Cage Lab Results  Component Value Date   PSA 3.82 02/09/2021   PSA 3.79 08/25/2020   PSA 3.3 03/15/2020   5. Colon cancer screening - colonoscopy 11/16/20 with 10 year repeat planned 6. Skin cancer screening-  yearly dermatology generally and if has issuesadvised regular sunscreen use. Denies worrisome, changing, or new skin lesions.  7. Smoking associated screening (lung cancer screening, AAA screen 65-75, UA)- former smoker-in lung cancer screening program , urines checked with urology 8. STD screening - -declines-he is monogamous  Status of chronic or acute concerns    # ED F/U for left-sided rib pain/chest pain S:Pt was seen by ED on 07/24/21 for an evaluation of left-sided rib pain. Pt stated that the day before visit, that morning he was trying to lift the garage door which was frozen shut. He included he felt a pain on the left chest wall. He denied hearing any "pop" and any other trauma. He also reported his pain worsened with bending and reaching however no changes with breathing or exertion.   It was thought to be related to musculoskeletal cause-low suspicion for cardiac abnormality.  After taking toradol in triage, he stated improvement. Decided to treat with over-the-counter medications and muscle relaxer.  A/P: healing at this point finally - not needing heat/ice anymore- will let us know if symptoms fail to improve    #CAD/hyperlipidemia -follows with Dr. Ellyn Hack of [facility] cardiology S: Patient with history of stents March 27, 2018 and August 04, 2016. Compliant with Plavix 75 mg daily. Lipids had been controlled with LDL under 70 on rosuvastatin 20 mg daily  -other than injury above- no chest pain or shortness of breath Lab Results  Component Value Date   CHOL 136 04/19/2021   HDL 41.50 04/19/2021   LDLCALC 66 04/19/2021   LDLDIRECT 159.1 06/05/2011   TRIG 143.0 04/19/2021   CHOLHDL 3 04/19/2021   A/P: CAD asymptomatic.  Lipids have been well controlled with LDL under 70 within a few months- hold off on LDL today  #hypertension S: compliant with carvedilol 12.5 mg twice daily, amlodipine 2.5 mg  BP Readings from Last 3 Encounters:  08/24/21 130/82  08/04/21 130/70  07/24/21 (!) 151/82  A/P: Controlled. Continue current medications. 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 -COVID-19 pandemic was also significant stressor- better after vaccine -Ativan primarily for sleep-had racing thoughts before bed= takes most nights A/P: reasonably controlled- continue current meds  - discussed potential risks and links to dementia  # Diabetes S: Diet controlled started Metformin September 16, 2019 at 250 mg due to A1c elevations of 6.5 by phlebotomy and later 6.0 by point-of-care test to try to prevent diabetes-but had severe arthralgias and had to stop Lab Results  Component Value Date   HGBA1C 6.8 (H) 04/19/2021   HGBA1C 6.1 (H) 03/15/2020   HGBA1C 6.0 (A) 09/16/2019   A/P: hopefully stable- update a1c today. Continue without meds for now - exercise down after rib injury so may have space to improve with increasing exercise back  #History of DVT in 1980sx2-both were thought to be provoked   #COPD/centrilobular emphysema-noted/stable on CT lung cancer screening. No significant symptoms other than baseline mucus production- continue to monitor  Recommended follow up: Return in about 6 months (around 02/21/2022) for follow up- or sooner if needed.  Lab/Order  associations: fasting   ICD-10-CM   1.  Preventative health care  Z00.00     2. CAD S/P percutaneous coronary angioplasty  I25.10    Z98.61     3. Essential hypertension  I10     4. Hyperlipidemia, unspecified hyperlipidemia type  E78.5 CBC with Differential/Platelet    Comprehensive metabolic panel    5. Panic attacks  F41.0     6. Former smoker  Z87.891     44. Nocturia  R35.1 PSA    8. Diabetes mellitus with cardiac complication (HCC)  Z61.09 CBC with Differential/Platelet    Comprehensive metabolic panel    HgB U0A    Microalbumin / creatinine urine ratio    9. Centrilobular emphysema (HCC) Chronic J43.2       No orders of the defined types were placed in this encounter.  I,Jada Bradford,acting as a scribe for Garret Reddish, MD.,have documented all relevant documentation on the behalf of Garret Reddish, MD,as directed by  Garret Reddish, MD while in the presence of Garret Reddish, MD.  I, Garret Reddish, MD, have reviewed all documentation for this visit. The documentation on 08/24/21 for the exam, diagnosis, procedures, and orders are all accurate and complete.   Return precautions advised.  Garret Reddish, MD

## 2021-08-24 ENCOUNTER — Ambulatory Visit (INDEPENDENT_AMBULATORY_CARE_PROVIDER_SITE_OTHER): Payer: PPO | Admitting: Family Medicine

## 2021-08-24 ENCOUNTER — Other Ambulatory Visit: Payer: Self-pay

## 2021-08-24 ENCOUNTER — Encounter: Payer: Self-pay | Admitting: Family Medicine

## 2021-08-24 VITALS — BP 130/82 | HR 61 | Temp 98.1°F | Ht 73.0 in | Wt 211.0 lb

## 2021-08-24 DIAGNOSIS — J432 Centrilobular emphysema: Secondary | ICD-10-CM | POA: Diagnosis not present

## 2021-08-24 DIAGNOSIS — E1159 Type 2 diabetes mellitus with other circulatory complications: Secondary | ICD-10-CM | POA: Diagnosis not present

## 2021-08-24 DIAGNOSIS — R351 Nocturia: Secondary | ICD-10-CM

## 2021-08-24 DIAGNOSIS — Z87891 Personal history of nicotine dependence: Secondary | ICD-10-CM

## 2021-08-24 DIAGNOSIS — Z9861 Coronary angioplasty status: Secondary | ICD-10-CM | POA: Diagnosis not present

## 2021-08-24 DIAGNOSIS — Z0000 Encounter for general adult medical examination without abnormal findings: Secondary | ICD-10-CM

## 2021-08-24 DIAGNOSIS — E785 Hyperlipidemia, unspecified: Secondary | ICD-10-CM | POA: Diagnosis not present

## 2021-08-24 DIAGNOSIS — F410 Panic disorder [episodic paroxysmal anxiety] without agoraphobia: Secondary | ICD-10-CM | POA: Diagnosis not present

## 2021-08-24 DIAGNOSIS — R739 Hyperglycemia, unspecified: Secondary | ICD-10-CM

## 2021-08-24 DIAGNOSIS — I2510 Atherosclerotic heart disease of native coronary artery without angina pectoris: Secondary | ICD-10-CM

## 2021-08-24 DIAGNOSIS — Z23 Encounter for immunization: Secondary | ICD-10-CM

## 2021-08-24 DIAGNOSIS — I10 Essential (primary) hypertension: Secondary | ICD-10-CM

## 2021-08-24 LAB — CBC WITH DIFFERENTIAL/PLATELET
Basophils Absolute: 0 10*3/uL (ref 0.0–0.1)
Basophils Relative: 0.7 % (ref 0.0–3.0)
Eosinophils Absolute: 0.1 10*3/uL (ref 0.0–0.7)
Eosinophils Relative: 1.7 % (ref 0.0–5.0)
HCT: 43.4 % (ref 39.0–52.0)
Hemoglobin: 14.2 g/dL (ref 13.0–17.0)
Lymphocytes Relative: 28.3 % (ref 12.0–46.0)
Lymphs Abs: 1.8 10*3/uL (ref 0.7–4.0)
MCHC: 32.7 g/dL (ref 30.0–36.0)
MCV: 89.3 fl (ref 78.0–100.0)
Monocytes Absolute: 0.7 10*3/uL (ref 0.1–1.0)
Monocytes Relative: 10.1 % (ref 3.0–12.0)
Neutro Abs: 3.8 10*3/uL (ref 1.4–7.7)
Neutrophils Relative %: 59.2 % (ref 43.0–77.0)
Platelets: 178 10*3/uL (ref 150.0–400.0)
RBC: 4.86 Mil/uL (ref 4.22–5.81)
RDW: 13.8 % (ref 11.5–15.5)
WBC: 6.5 10*3/uL (ref 4.0–10.5)

## 2021-08-24 LAB — COMPREHENSIVE METABOLIC PANEL
ALT: 16 U/L (ref 0–53)
AST: 18 U/L (ref 0–37)
Albumin: 4.5 g/dL (ref 3.5–5.2)
Alkaline Phosphatase: 59 U/L (ref 39–117)
BUN: 15 mg/dL (ref 6–23)
CO2: 28 mEq/L (ref 19–32)
Calcium: 9.4 mg/dL (ref 8.4–10.5)
Chloride: 103 mEq/L (ref 96–112)
Creatinine, Ser: 0.8 mg/dL (ref 0.40–1.50)
GFR: 92.21 mL/min (ref >60.00)
Glucose, Bld: 95 mg/dL (ref 70–99)
Potassium: 4.2 mEq/L (ref 3.5–5.1)
Sodium: 139 mEq/L (ref 135–145)
Total Bilirubin: 0.8 mg/dL (ref 0.2–1.2)
Total Protein: 7.1 g/dL (ref 6.0–8.3)

## 2021-08-24 LAB — PSA: PSA: 4.16 ng/mL — ABNORMAL HIGH (ref 0.10–4.00)

## 2021-08-24 LAB — MICROALBUMIN / CREATININE URINE RATIO
Creatinine,U: 145 mg/dL
Microalb Creat Ratio: 1.2 mg/g (ref 0.0–30.0)
Microalb, Ur: 1.7 mg/dL (ref 0.0–1.9)

## 2021-08-24 LAB — HEMOGLOBIN A1C: Hgb A1c MFr Bld: 6.4 % (ref 4.6–6.5)

## 2021-08-24 NOTE — Patient Instructions (Addendum)
Health Maintenance Due  Topic Date Due   Pneumonia Vaccine 82+ Years old (1 - PCV)- prevnar 72 today before you leave Never done   OPHTHALMOLOGY EXAM - let your eye doctor know you have diabetes and send [place] a copy of next eye exam Never done   COVID-19 Vaccine (4 - Booster for [employer] series)  Let us know if you get at pharmacy 07/05/2020   Please stop by lab before you go If you have mychart- we will send your results within 3 business days of [place] receiving them.  If you do not have mychart- we will call you about results within 5 business days of [place] receiving them.  *please also note that you will see labs on mychart as soon as they post. I will later go in and write notes on them- will say "notes from Dr. Yong Channel"   Recommended follow up: Return in about 6 months (around 02/21/2022) for follow up- or sooner if needed. Sooner if your a1c is above 7

## 2021-08-24 NOTE — Addendum Note (Signed)
Addended by: Marijean Heath R on: 08/24/2021 11:45 AM   Modules accepted: Orders

## 2021-09-07 DIAGNOSIS — K120 Recurrent oral aphthae: Secondary | ICD-10-CM | POA: Diagnosis not present

## 2021-10-13 ENCOUNTER — Encounter: Payer: Self-pay | Admitting: Cardiology

## 2021-10-19 ENCOUNTER — Encounter: Payer: Self-pay | Admitting: Family Medicine

## 2021-10-20 ENCOUNTER — Other Ambulatory Visit: Payer: Self-pay

## 2021-10-20 MED ORDER — LORAZEPAM 0.5 MG PO TABS: ORAL_TABLET | ORAL | 1 refills | Status: DC

## 2021-10-20 NOTE — Progress Notes (Signed)
Refilled medication

## 2021-10-28 ENCOUNTER — Other Ambulatory Visit: Payer: Self-pay | Admitting: *Deleted

## 2021-10-28 MED ORDER — ROSUVASTATIN CALCIUM 20 MG PO TABS: 20.0000 mg | ORAL_TABLET | Freq: Every day | ORAL | 1 refills | Status: DC

## 2021-10-28 MED ORDER — CARVEDILOL 12.5 MG PO TABS: 12.5000 mg | ORAL_TABLET | Freq: Two times a day (BID) | ORAL | 1 refills | Status: DC

## 2021-10-28 MED ORDER — CLOPIDOGREL BISULFATE 75 MG PO TABS: 75.0000 mg | ORAL_TABLET | Freq: Every day | ORAL | 1 refills | Status: DC

## 2021-10-28 NOTE — Telephone Encounter (Signed)
Received a fax requesting refill on patient's medication. This is a [facility] patient.  ?

## 2021-10-31 ENCOUNTER — Telehealth: Payer: Self-pay | Admitting: Cardiology

## 2021-10-31 NOTE — Telephone Encounter (Signed)
?*  STAT* If patient is at the pharmacy, call can be transferred to refill team. ? ? ?1. Which medications need to be refilled? (please list name of each medication and dose if known) pantoprazole (PROTONIX) 40 MG tablet ? ?2. Which pharmacy/location (including street and city if local pharmacy) is medication to be sent [facility] - [place], [place] - [address] ? ?3. Do they need a 30 day or 90 day supply? 90 ? ?

## 2021-11-01 MED ORDER — PANTOPRAZOLE SODIUM 40 MG PO TBEC: 40.0000 mg | DELAYED_RELEASE_TABLET | Freq: Every day | ORAL | 3 refills | Status: DC

## 2021-12-05 ENCOUNTER — Ambulatory Visit: Payer: PPO | Admitting: Cardiology

## 2021-12-05 ENCOUNTER — Encounter: Payer: Self-pay | Admitting: Cardiology

## 2021-12-05 VITALS — BP 130/70 | HR 71 | Ht 73.0 in | Wt 213.4 lb

## 2021-12-05 DIAGNOSIS — Z9861 Coronary angioplasty status: Secondary | ICD-10-CM

## 2021-12-05 DIAGNOSIS — I2121 ST elevation (STEMI) myocardial infarction involving left circumflex coronary artery: Secondary | ICD-10-CM | POA: Diagnosis not present

## 2021-12-05 DIAGNOSIS — J432 Centrilobular emphysema: Secondary | ICD-10-CM

## 2021-12-05 DIAGNOSIS — I2510 Atherosclerotic heart disease of native coronary artery without angina pectoris: Secondary | ICD-10-CM | POA: Diagnosis not present

## 2021-12-05 DIAGNOSIS — I10 Essential (primary) hypertension: Secondary | ICD-10-CM

## 2021-12-05 DIAGNOSIS — I700 Atherosclerosis of aorta: Secondary | ICD-10-CM | POA: Diagnosis not present

## 2021-12-05 DIAGNOSIS — E1159 Type 2 diabetes mellitus with other circulatory complications: Secondary | ICD-10-CM

## 2021-12-05 DIAGNOSIS — E785 Hyperlipidemia, unspecified: Secondary | ICD-10-CM

## 2021-12-05 NOTE — Patient Instructions (Signed)

## 2021-12-05 NOTE — Progress Notes (Signed)
Primary Care Provider: Marin Olp, MD Cardiologist: [name], MD Electrophysiologist: None  Clinic Note: Chief Complaint  Patient presents with   Follow-up    Annual.  Doing well.  No major issues.  Just having issues with his right hip.   Coronary Artery Disease    No further angina or heart failure.    ===================================  ASSESSMENT/PLAN   Problem List Items Addressed This Visit       Cardiology Problems   CAD S/P percutaneous coronary angioplasty - Primary (Chronic)    Now status post multivessel PCI.  1 was in the setting of STEMI be elevated in setting of unstable angina.  Not having any chest pain except that 1 episode of costochondritis he had in December.  Plan: Continue calcium blocker, beta-blocker and statin. On lifelong maintenance clopidogrel 75 mg daily.  No major bleeding issues. Okay to hold clopidogrel/Plavix 7 days preop for surgeries or procedures.      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  Had nondominant circumflex was occluded treated with DES stent.  Initially reduced EF recovered back to baseline restoration of flow and initiation of guideline directed medical therapy.  No further arrhythmias, angina or CHF symptoms.       Relevant Orders   EKG 12-Lead (Completed)   Dyslipidemia, goal LDL below 50 (Chronic)    He is now on rosuvastatin 20 mg daily.  Tolerating it better than he did atorvastatin.  Labs were just checked in September had LDL of 66.  Memory issues and myalgias are much better with rosuvastatin.  Continue to monitor.       Essential hypertension (Chronic)    Blood pressure stable on current dose of amlodipine, carvedilol.       Thoracic aortic atherosclerosis (HCC) (Chronic)    On aggressive cardiovascular risk  modification with history of CAD.  No signs of aneurysm or ulcerative lesions.  Continue current therapy.       Relevant Orders   EKG 12-Lead (Completed)     Other   Centrilobular emphysema ([place]) (Chronic)    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  This is a finding seen on CT scan.  Followed by PCP.  Not on any COPD medications.        ===================================  HPI:    Terry Wright is a 66 y.o. male with a PMH notable for CAD-inferior STEMI (complicated by VT arrest January 2018-PCI LCx; August 2019-distal LCx-OM 3 PCI), with resolved given cardiomyopathy who presents today for annual follow-up.  He is seen here today at the request of Marin Olp, MD.  Cardiac History: Inferior-posterior STEMI/VT arrest 08/06/2016 --> Shock x 2 for VT - ROSC -> CATH - 100% pCx - DES PCI (Synergy DES 4.0 x 16 - 4.6 mm)   Initial EF by LV gram was 35-40% increase to 40 and 45% by echo 2-D echo 01/11/2017: EF 50-55%. GR 1 DD. Mild to moderate LA dilation. Unstable Angina - Cath 03/27/2018: patent stent --> Cx-OM2 95% ([place]) -- DES PCI. ~70% dRCA trifurcation (med Rx).  I last saw Terry Wright on May 27, 2020-is doing very well.  Was healing up from his hip surgery, just sore.  Regaining strength.  Stable from cardiac standpoint.  Had no  further chest pain issues since we have started amlodipine earlier this year.  No changes  He was most recently seen on November 24, 2020 by Coletta Memos, NP.  Check was doing well.  Finally recovered from his hip surgery and was hoping to have his right hip done but was delaying as long as he could.  Walking more than 10,000 steps a day using the treadmill but also stationary bicycle.  Still eating healthy.  No medication changes made.  Recent Hospitalizations:  07/24/2021: ER visit for left lateral chest pain thought to be acute costochondritis.  Reviewed  CV studies:     The following studies were reviewed today: (if available, images/films reviewed: From Epic Chart or Care Everywhere) none:   Interval History:   Terry Wright returns here today for follow-up doing quite well.  He recovered well from the left hip surgery but now the right hip is really bothering him and limiting his activity.  He is not sure what is going to surgery but he is delaying the for now.  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  As such, he cannot go forward with his plans for surgery on his hip because he would not be out of care for her postoperatively.  Once she recovers, he thinks he may go forward with right hip surgery..  For now, he continues to try to do what he is able to do from an exercise standpoint, but is somewhat limited.  He still denies any chest pain or pressure with rest exertion.  No PND, orthopnea or edema.  No syncope or near syncope, no TIA or amaurosis fugax.  REVIEWED OF SYSTEMS   Review of Systems  Constitutional:  Negative for malaise/fatigue (Just not as active) and weight loss.  HENT:  Negative for congestion and nosebleeds.   Respiratory:  Negative for cough, shortness of breath and wheezing.   Cardiovascular:        Per HPI  Gastrointestinal:  Negative for abdominal pain.  Genitourinary:  Negative for dysuria, flank pain and hematuria.  Musculoskeletal:  Positive for joint pain (Mostly right hip) and myalgias (Associated with hip pain:).  Neurological:  Negative for dizziness and focal weakness.  Endo/Heme/Allergies:  Does not bruise/bleed easily.  Psychiatric/Behavioral:  Negative for depression. The patient is not nervous/anxious and does not have insomnia.    I have reviewed and (if needed) personally updated the patient's problem list, medications, allergies, past medical and surgical history, social and family history.   PAST MEDICAL HISTORY   Past Medical History:   Diagnosis Date   Anginal pain ([place]) 2018   Anxiety    Arthritis    shoulders hips,    CAD S/P percutaneous coronary angioplasty 07/2016, 02/2018   a) 100% pCx -- DES PCI with 4.0 x 16 mm Synergy DES. 65% ostial OM 3 (relatively small caliber - Med Rx);; b) 03/27/18 PCI/DES to dLCX/OM3 x1. Patent pCX DES. 70% dRCA trifurcation lesion (Med Rx). Normal EF   COPD (chronic obstructive pulmonary disease) ([place]) 2018   mild   DVT (deep venous thrombosis) (HCC)    Right upper arm DVT on 2 occasions in the remote past   Dysrhythmia    V tac   Ejection fraction    LV function normal, echo, February, 2010   GERD (gastroesophageal reflux disease)    Hyperlipidemia    Hypertension    Mitral regurgitation  Mild, echo, February, 2010   Palpitations    Mild in the past   Peripheral vascular disease ([place]) 1980s   DVT   Pre-diabetes    Statin intolerance    Felt poorly after Lipitor, Crestor, TriCor   STEMI involving left circumflex coronary artery ([place]) 08/04/2016   Occluded very large caliber codominant circumflex - PCI with single Synergy DES   Tobacco abuse     PAST SURGICAL HISTORY   Past Surgical History:  Procedure Laterality Date   CARDIAC CATHETERIZATION N/A 08/04/2016   Procedure: Left Heart Cath and Coronary Angiography;  Surgeon: Leonie Man, MD;  Location: [facility];  Service: Cardiovascular;  Laterality: N/A: 100% pCx --> PCI. residual 65% pOM3 (small).  ~EF 35-45%  with lateral HK.   CARDIAC CATHETERIZATION N/A 08/04/2016   Procedure: CORONARY STENT INTERVENTION;  Surgeon: Leonie Man, MD;  Location: [facility];  Service: Cardiovascular;  Laterality: N/A: pCx 100%-0%: Synergy DES 4.0 x 16 (4.6 mm)   CHONDROPLASTY Right 04/04/2017   Procedure: CHONDROPLASTY;  Surgeon: Dorna Leitz, MD;  Location: [facility];  Service: Orthopedics;  Laterality: Right;   CORONARY STENT INTERVENTION N/A 03/27/2018   Procedure: CORONARY STENT INTERVENTION;  Surgeon: Martinique, Peter M,  MD;  Location: [facility];  Service: Cardiovascular:: dCx-OM3 95%-0%: DES PCI - Synergy 2.25 x 16 mm.   GANGLION CYST EXCISION     oct 2017   HERNIA REPAIR     umbilical hernia 2017 oct   KNEE ARTHROSCOPY Right 04/04/2017   Procedure: ARTHROSCOPY RIGHT KNEE, WITH MEIDAL FEMORAL CONDYLE PATELLAR MEDIAL FEMORAL JOINT PLICA EXCISION ;  Surgeon: Dorna Leitz, MD;  Location: [facility];  Service: Orthopedics;  Laterality: Right;   LEFT HEART CATH AND CORONARY ANGIOGRAPHY N/A 03/27/2018   Procedure: LEFT HEART CATH AND CORONARY ANGIOGRAPHY;  Surgeon: Martinique, Peter M, MD;  Location: [facility];  Service: Cardiovascular:   2 vessel obstructive CAD: - 95% dLCx/OM3 --Successful PCI of the dLCx/OM3 with SYNERGY DES 2.25X16, -70% distal RCA at trifurcation (Med Management).  Widely patent pLCx DES. Low normal LV function (50-55%), Normal LVEDP    TOTAL HIP ARTHROPLASTY Left 04/08/2020   Procedure: TOTAL HIP ARTHROPLASTY ANTERIOR APPROACH;  Surgeon: Paralee Cancel, MD;  Location: [facility];  Service: Orthopedics;  Laterality: Left;  70 mins   TRANSTHORACIC ECHOCARDIOGRAM  08/05/2016   post STEMI: EF 40-45%. Mild concentric LVH. Severe HK of basal-mid inferolateral wall consistent with infarct in this distribution. GR 1 DD   TRANSTHORACIC ECHOCARDIOGRAM  01/11/2017   EF 50-55%. GR 1 DD. Mild to moderate LA dilation.    Immunization History  Administered Date(s) Administered   Fluad Quad(high Dose 65+) 04/05/2021   Influenza,inj,Quad PF,6+ Mos 04/28/2019   Influenza-Unspecified 05/17/2016, 05/02/2017, 04/15/2018, 04/28/2019, 05/07/2020   PFIZER(Purple Top)SARS-COV-2 Vaccination 08/19/2019, 09/09/2019, 05/10/2020   PNEUMOCOCCAL CONJUGATE-20 08/24/2021   Tdap 10/28/2015   Zoster Recombinat (Shingrix) 09/17/2017, 12/23/2017    MEDICATIONS/ALLERGIES   Current Meds  Medication Sig   acetaminophen (TYLENOL 8 HOUR ARTHRITIS PAIN) 650 MG CR tablet Take 650 mg by mouth every 8 (eight) hours as needed for  pain.   amLODipine (NORVASC) 2.5 MG tablet Take 1 tablet (2.5 mg total) by mouth daily.   carvedilol (COREG) 12.5 MG tablet Take 1 tablet (12.5 mg total) by mouth 2 (two) times daily.   clopidogrel (PLAVIX) 75 MG tablet Take 1 tablet (75 mg total) by mouth daily.   cyclobenzaprine (FLEXERIL) 10 MG tablet cyclobenzaprine 10  mg tablet   LORazepam (ATIVAN) 0.5 MG tablet TAKE 1 TABLET BY MOUTH AT BEDTIME AS NEEDED FOR ANXIETY (DO NOT DRIVE FOR 8 HOURS AFTER TAKING)   nitroGLYCERIN (NITROSTAT) 0.4 MG SL tablet Place 1 tablet (0.4 mg total) under the tongue every 5 (five) minutes as needed for chest pain.   pantoprazole (PROTONIX) 40 MG tablet Take 1 tablet (40 mg total) by mouth daily.   rosuvastatin (CRESTOR) 20 MG tablet Take 1 tablet (20 mg total) by mouth daily.   triamcinolone lotion (KENALOG) 0.1 % Apply 1 application topically 2 (two) times daily. Mix lotion with cream and apply twice daily to affected area    Allergies  Allergen Reactions   Metformin     Other reaction(s): Other (See Comments) After starting metformin- Severe hip pain bilaterally worse on left. Could not turn neck. Back pain. Symptoms drastically improved off metformin   Metformin And Related     After starting metformin- Severe hip pain bilaterally worse on left. Could not turn neck. Back pain. Symptoms drastically improved off metformin    SOCIAL HISTORY/FAMILY HISTORY   Reviewed in Epic:  Pertinent findings:  Social History   Tobacco Use   Smoking status: Former    Packs/day: 1.00    Years: 43.00    Pack years: 43.00    Types: Cigarettes    Quit date: 2015    Years since quitting: 8.4   Smokeless tobacco: Never   Tobacco comments:    Former smoker quit 2015  Vaping Use   Vaping Use: Never used  Substance Use Topics   Alcohol use: Yes    Alcohol/week: 7.0 standard drinks    Types: 7 Glasses of wine per week    Comment: occasional   Drug use: No   Social History   Social History Narrative    Married 10 years in 2018. 2 step kids- 46 and 86. 3 kids but he does not have contact with. No grandkids      Retired Theatre stage manager.    Wife working [place]      Hobbies: golf (needs shoulder replacement though, working in the yard, working out- was doing this regularly even before MI- walking/treadmill    OBJCTIVE -PE, EKG, labs   Wt Readings from Last 3 Encounters:  12/05/21 213 lb 6.4 oz (96.8 kg)  08/24/21 211 lb (95.7 kg)  08/04/21 210 lb 2 oz (95.3 kg)    Physical Exam: BP 130/70 (BP Location: Left Arm)   Pulse 71   Ht '6\' 1"'$  (1.854 m)   Wt 213 lb 6.4 oz (96.8 kg)   SpO2 94%   BMI 28.15 kg/m  Physical Exam Vitals reviewed.  Constitutional:      General: He is not in acute distress.    Appearance: Normal appearance. He is normal weight. He is not ill-appearing or toxic-appearing.     Comments: Healthy-appearing.  Well-groomed, well-nourished.  HENT:     Head: Normocephalic and atraumatic.  Neck:     Vascular: No carotid bruit.  Cardiovascular:     Rate and Rhythm: Normal rate and regular rhythm.     Pulses: Normal pulses.     Heart sounds: Normal heart sounds. No murmur heard.   No friction rub. No gallop.  Pulmonary:     Effort: Pulmonary effort is normal. No respiratory distress.     Breath sounds: Normal breath sounds. No wheezing, rhonchi or rales.  Chest:     Chest wall: No tenderness.  Musculoskeletal:  General: No swelling. Normal range of motion.     Cervical back: Normal range of motion and neck supple.  Skin:    General: Skin is warm and dry.  Neurological:     General: No focal deficit present.     Mental Status: He is alert and oriented to person, place, and time.     Cranial Nerves: No cranial nerve deficit.     Motor: No weakness.     Gait: Gait normal.  Psychiatric:        Mood and Affect: Mood normal.        Behavior: Behavior normal.        Thought Content: Thought content normal.        Judgment: Judgment normal.      Adult ECG Report  Rate: 71;  Rhythm: normal sinus rhythm and lateral T wave inversions.  Otherwise normal axis, intervals and durations. ;   Narrative Interpretation: Stable  Recent Labs: Reviewed Lab Results  Component Value Date   CHOL 136 04/19/2021   HDL 41.50 04/19/2021   LDLCALC 66 04/19/2021   LDLDIRECT 159.1 06/05/2011   TRIG 143.0 04/19/2021   CHOLHDL 3 04/19/2021   Lab Results  Component Value Date   CREATININE 0.80 08/24/2021   BUN 15 08/24/2021   NA 139 08/24/2021   K 4.2 08/24/2021   CL 103 08/24/2021   CO2 28 08/24/2021      Latest Ref Rng & Units 08/24/2021   12:03 PM 04/19/2021    8:32 AM 03/30/2020    3:19 PM  CBC  WBC 4.0 - 10.5 K/uL 6.5   6.3   7.7    Hemoglobin 13.0 - 17.0 g/dL 14.2   14.1   15.0    Hematocrit 39.0 - 52.0 % 43.4   42.8   45.3    Platelets 150.0 - 400.0 K/uL 178.0   171.0   211      Lab Results  Component Value Date   HGBA1C 6.4 08/24/2021   No results found for: TSH  ==================================================  COVID-19 Education: The signs and symptoms of COVID-19 were discussed with the patient and how to seek care for testing (follow up with PCP or arrange E-visit).    I spent a total of 20 minutes with the patient spent in direct patient consultation.  Additional time spent with chart review  / charting (studies, outside notes, etc): 15 min Total Time: 35 min  Current medicines are reviewed at length with the patient today.  (+/- concerns) none  Notice: This dictation was prepared with Dragon dictation along with smart phrase technology. Any transcriptional errors that result from this process are unintentional and ma he is over 5 years out from his major event.  Occluded y not be corrected upon review.  Studies Ordered:   Orders Placed This Encounter  Procedures   EKG 12-Lead   No orders of the defined types were placed in this encounter.   Patient Instructions / Medication Changes & Studies & Tests Ordered    Patient Instructions  Medication Instructions:  No changes   *If you need a refill on your cardiac medications before your next appointment, please call your pharmacy*   Lab Work: Not needed    Testing/Procedures:  Not needed  Follow-Up: At [facility], you and your health needs are our priority.  As part of our continuing mission to provide you with exceptional heart care, we have created designated Provider Care Teams.  These Care Teams include your  primary Cardiologist (physician) and Advanced Practice Providers (APPs -  Physician Assistants and Nurse Practitioners) who all work together to provide you with the care you need, when you need it.     Your next appointment:   12 month(s)  The format for your next appointment:   In Person  Provider:   [name], MD    Other Instructions      [name], M.D., M.S. Interventional Cardiologist   Pager # [phone] Phone # [phone] [address] [place], [place] [address]   Thank you for choosing Heartcare at [facility]!!

## 2021-12-31 ENCOUNTER — Encounter: Payer: Self-pay | Admitting: Cardiology

## 2021-12-31 NOTE — Assessment & Plan Note (Signed)
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  This is a finding seen on CT scan.  Followed by PCP.  Not on any COPD medications.

## 2021-12-31 NOTE — Assessment & Plan Note (Signed)
He is over 5 years out from his STEMI complicated by cardiac arrest.  Had nondominant circumflex was occluded treated with DES stent.  Initially reduced EF recovered back to baseline restoration of flow and initiation of guideline directed medical therapy.  No further arrhythmias, angina or CHF symptoms.

## 2021-12-31 NOTE — Assessment & Plan Note (Signed)
On aggressive cardiovascular risk modification with history of CAD.  No signs of aneurysm or ulcerative lesions.  Continue current therapy.

## 2021-12-31 NOTE — Assessment & Plan Note (Signed)
He is now on rosuvastatin 20 mg daily.  Tolerating it better than he did atorvastatin.  Labs were just checked in September had LDL of 66.  Memory issues and myalgias are much better with rosuvastatin.  Continue to monitor.

## 2021-12-31 NOTE — Assessment & Plan Note (Signed)
Now status post multivessel PCI.  1 was in the setting of STEMI be elevated in setting of unstable angina.  Not having any chest pain except that 1 episode of costochondritis he had in December.  Plan: Continue calcium blocker, beta-blocker and statin.  On lifelong maintenance clopidogrel 75 mg daily.  No major bleeding issues.  Okay to hold clopidogrel/Plavix 7 days preop for surgeries or procedures.

## 2021-12-31 NOTE — Assessment & Plan Note (Signed)
Blood pressure stable on current dose of amlodipine, carvedilol.

## 2022-01-23 ENCOUNTER — Other Ambulatory Visit: Payer: Self-pay | Admitting: Family Medicine

## 2022-01-26 DIAGNOSIS — S63501A Unspecified sprain of right wrist, initial encounter: Secondary | ICD-10-CM | POA: Diagnosis not present

## 2022-02-21 ENCOUNTER — Encounter: Payer: Self-pay | Admitting: Family Medicine

## 2022-02-21 ENCOUNTER — Ambulatory Visit (INDEPENDENT_AMBULATORY_CARE_PROVIDER_SITE_OTHER): Payer: PPO | Admitting: Family Medicine

## 2022-02-21 VITALS — BP 118/64 | HR 61 | Temp 98.1°F | Ht 73.0 in | Wt 211.2 lb

## 2022-02-21 DIAGNOSIS — I10 Essential (primary) hypertension: Secondary | ICD-10-CM

## 2022-02-21 DIAGNOSIS — E785 Hyperlipidemia, unspecified: Secondary | ICD-10-CM

## 2022-02-21 DIAGNOSIS — Z9861 Coronary angioplasty status: Secondary | ICD-10-CM | POA: Diagnosis not present

## 2022-02-21 DIAGNOSIS — E1159 Type 2 diabetes mellitus with other circulatory complications: Secondary | ICD-10-CM

## 2022-02-21 DIAGNOSIS — R972 Elevated prostate specific antigen [PSA]: Secondary | ICD-10-CM

## 2022-02-21 DIAGNOSIS — I2510 Atherosclerotic heart disease of native coronary artery without angina pectoris: Secondary | ICD-10-CM | POA: Diagnosis not present

## 2022-02-21 LAB — CBC WITH DIFFERENTIAL/PLATELET
Basophils Absolute: 0.1 10*3/uL (ref 0.0–0.1)
Basophils Relative: 0.9 % (ref 0.0–3.0)
Eosinophils Absolute: 0.2 10*3/uL (ref 0.0–0.7)
Eosinophils Relative: 2.3 % (ref 0.0–5.0)
HCT: 43.6 % (ref 39.0–52.0)
Hemoglobin: 14.5 g/dL (ref 13.0–17.0)
Lymphocytes Relative: 24.3 % (ref 12.0–46.0)
Lymphs Abs: 1.7 10*3/uL (ref 0.7–4.0)
MCHC: 33.2 g/dL (ref 30.0–36.0)
MCV: 90.1 fl (ref 78.0–100.0)
Monocytes Absolute: 0.9 10*3/uL (ref 0.1–1.0)
Monocytes Relative: 12.7 % — ABNORMAL HIGH (ref 3.0–12.0)
Neutro Abs: 4.1 10*3/uL (ref 1.4–7.7)
Neutrophils Relative %: 59.8 % (ref 43.0–77.0)
Platelets: 189 10*3/uL (ref 150.0–400.0)
RBC: 4.84 Mil/uL (ref 4.22–5.81)
RDW: 13.8 % (ref 11.5–15.5)
WBC: 6.9 10*3/uL (ref 4.0–10.5)

## 2022-02-21 LAB — COMPREHENSIVE METABOLIC PANEL
ALT: 21 U/L (ref 0–53)
AST: 19 U/L (ref 0–37)
Albumin: 4.6 g/dL (ref 3.5–5.2)
Alkaline Phosphatase: 56 U/L (ref 39–117)
BUN: 16 mg/dL (ref 6–23)
CO2: 30 mEq/L (ref 19–32)
Calcium: 9.7 mg/dL (ref 8.4–10.5)
Chloride: 104 mEq/L (ref 96–112)
Creatinine, Ser: 0.9 mg/dL (ref 0.40–1.50)
GFR: 88.68 mL/min (ref >60.00)
Glucose, Bld: 102 mg/dL — ABNORMAL HIGH (ref 70–99)
Potassium: 5.1 mEq/L (ref 3.5–5.1)
Sodium: 141 mEq/L (ref 135–145)
Total Bilirubin: 0.7 mg/dL (ref 0.2–1.2)
Total Protein: 7.3 g/dL (ref 6.0–8.3)

## 2022-02-21 LAB — PSA: PSA: 4.02 ng/mL — ABNORMAL HIGH (ref 0.10–4.00)

## 2022-02-21 LAB — HEMOGLOBIN A1C: Hgb A1c MFr Bld: 6.7 % — ABNORMAL HIGH (ref 4.6–6.5)

## 2022-02-21 LAB — LDL CHOLESTEROL, DIRECT: Direct LDL: 69 mg/dL

## 2022-02-21 MED ORDER — TAMSULOSIN HCL 0.4 MG PO CAPS: 0.4000 mg | ORAL_CAPSULE | Freq: Every day | ORAL | 3 refills | Status: DC

## 2022-02-21 NOTE — Progress Notes (Signed)
Phone [phone] In person visit   Subjective:   Terry Wright is a 66 y.o. year old very pleasant male patient who presents for/with See problem oriented charting Chief Complaint  Patient presents with   Follow-up   Hypertension   Hyperglycemia   Past Medical History-  Patient Active Problem List   Diagnosis Date Noted   CAD S/P percutaneous coronary angioplasty 09/21/2016    Priority: High   Insomnia 09/21/2016    Priority: High   Diabetes mellitus with cardiac complication ([place]) 64/40/3474    Priority: High   STEMI involving left circumflex coronary artery ([place]) -complicated by cardiac arrest 08/04/2016    Priority: High   History of DVT (deep vein thrombosis)     Priority: High   Former smoker     Priority: High   Centrilobular emphysema ([place]) 09/04/2017    Priority: Medium    Ischemic cardiomyopathy 10/12/2016    Priority: Medium    Dyslipidemia, goal LDL below 50     Priority: Medium    Essential hypertension     Priority: Medium    Unstable angina ([place])     Priority: Low   Thoracic aortic atherosclerosis ([place]) 09/04/2017    Priority: Low   Acute medial meniscus tear of right knee 04/04/2017    Priority: Low   GERD (gastroesophageal reflux disease)     Priority: Low   Ventricular bigeminy     Priority: Low   COVID-19 virus infection 12/17/2020   Left hip OA 04/08/2020   S/P left THA, AA 04/08/2020   Chondromalacia, 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     amLODipine (NORVASC) 2.5 MG tablet Take 1 tablet (2.5 mg total) by mouth daily. 90 tablet 3   carvedilol (COREG) 12.5 MG tablet Take 1 tablet (12.5 mg total) by mouth 2 (two) times daily. 180 tablet 1   clopidogrel (PLAVIX) 75 MG tablet Take 1 tablet (75 mg total) by mouth daily.  90 tablet 1   cyclobenzaprine (FLEXERIL) 10 MG tablet cyclobenzaprine 10 mg tablet     LORazepam (ATIVAN) 0.5 MG tablet TAKE 1 TABLET BY MOUTH AT BEDTIME AS NEEDED FOR ANXIETY (DO NOT DRIVE FOR EIGHT HOURS AFTER TAKING) 90 tablet 1   nitroGLYCERIN (NITROSTAT) 0.4 MG SL tablet Place 1 tablet (0.4 mg total) under the tongue every 5 (five) minutes as needed for chest pain. 25 tablet 1   pantoprazole (PROTONIX) 40 MG tablet Take 1 tablet (40 mg total) by mouth daily. 90 tablet 3   rosuvastatin (CRESTOR) 20 MG tablet Take 1 tablet (20 mg total) by mouth daily. 90 tablet 1   triamcinolone lotion (KENALOG) 0.1 % Apply 1 application topically 2 (two) times daily. Mix lotion with cream and apply twice daily to affected area     No current facility-administered medications for this visit.     Objective:  BP 118/64   Pulse 61   Temp 98.1 F (36.7 C)   Ht '6\' 1"'$  (1.854 m)   Wt 211 lb 3.2 oz (95.8 kg)   SpO2 96%   BMI 27.86 kg/m  Gen: NAD, resting comfortably CV: RRR no murmurs rubs or gallops Lungs: CTAB no crackles, wheeze, rhonchi Ext: minimal  edema Skin: warm, dry    Assessment and Plan   #social update-  daughter getting married in [place] in November  #Right hip OA- may need surgery for this- would need cardiac clearance from Dr. Ellyn Hack. Will be Dr. Alvan Dame.  -also tweaked wrist recently  # BPH S:incomplete emptying. Nocturia 3-4x a time.   Lab Results  Component Value Date   PSA1 3.7 04/11/2018   PSA 4.16 (H) 08/24/2021   PSA 3.82 02/09/2021   PSA 3.79 08/25/2020  A/P: We need to update PSA with labs today-she also agrees to schedule follow-up with urology Dr. Milford Cage.  With his symptoms we opted to trial Flomax/tamsulosin-we discussed risk of orthostatic hypotension  #CAD/hyperlipidemia/aortic atherosclerosis -follows with Dr. Ellyn Hack of [facility] cardiology S: Patient with history of stents March 27, 2018 and August 04, 2016.  Compliant with Plavix 75 mg.  -no chest pain or  shortness of breath  Lipids have been controlled with LDL under 70 on rosuvastatin 20 mg-Appears Dr. Ellyn Hack would prefer to get LDL under 50-consider rosuvastatin 40 mg at follow-up Lab Results  Component Value Date   CHOL 136 04/19/2021   HDL 41.50 04/19/2021   LDLCALC 66 04/19/2021   LDLDIRECT 159.1 06/05/2011   TRIG 143.0 04/19/2021   CHOLHDL 3 04/19/2021  A/P: CAD asymptomatic- continue current meds Lipids have looked pretty good but cardiology prefers LDL below 50 or 55- we are considering rosuvastatin '40mg'$  if above goal today  #hypertension S: compliant with carvedilol 12.5 mg twice daily, amlodipine 2.5 mg  A/P: Controlled. Continue current medications.   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 -COVID-19 pandemic is also significant stressor- better after vaccine -Ativan primarily for sleep-has racing thoughts before bed - no recent panic attacks in the daytime- more common with boarding plan  A/P: doing well with this lately- continue current medicines   - may use before upcoming plane trip to [place]- daughters wedding  # Diabetes- new diagnosis9/20/2022 S: Medication:none - joint pain on metformin unfortunately Exercise and diet- some issues with hip with exercise. Weight stable at least - could work on mild weight loss  A/P: hopefully stable- update a1c today. Continue current meds for now  #COPD/centrilobular emphysema-noted on CT lung cancer screening.  Asymptomatic other than some phlegm- monitor  Recommended follow up: Return in about 6 months (around 08/24/2022) for physical or sooner if needed.Schedule b4 you leave.  Lab/Order associations:   ICD-10-CM   1. CAD S/P percutaneous coronary angioplasty  I25.10    Z98.61     2. Diabetes mellitus with cardiac complication (HCC)  Z61.09     3. Essential hypertension  I10     4. Dyslipidemia, goal LDL below 50  E78.5     5. Elevated PSA  R97.20       No orders of the  defined types were placed in this encounter.   Return precautions advised.  Garret Reddish, MD

## 2022-02-21 NOTE — Patient Instructions (Addendum)
Consider covid shot in October (they should be releasing a new one at that time)  Make sure at next eye exam to let them know you have diabetes and have them send [place] a copy  I will check psa but go ahead and schedule urology follow up   Trial flomax as well- tamsulosin -dont take nitroglycerin within 24 hours of this  Please stop by lab before you go If you have mychart- we will send your results within 3 business days of [place] receiving them.  If you do not have mychart- we will call you about results within 5 business days of [place] receiving them.  *please also note that you will see labs on mychart as soon as they post. I will later go in and write notes on them- will say "notes from Dr. Yong Channel"   Recommended follow up: Return in about 6 months (around 08/24/2022) for physical or sooner if needed.Schedule b4 you leave.

## 2022-02-22 ENCOUNTER — Other Ambulatory Visit: Payer: Self-pay

## 2022-02-22 MED ORDER — ROSUVASTATIN CALCIUM 40 MG PO TABS: 40.0000 mg | ORAL_TABLET | Freq: Every day | ORAL | 3 refills | Status: DC

## 2022-02-23 ENCOUNTER — Ambulatory Visit (INDEPENDENT_AMBULATORY_CARE_PROVIDER_SITE_OTHER): Payer: PPO

## 2022-02-23 DIAGNOSIS — Z0000 Encounter for general adult medical examination without abnormal findings: Secondary | ICD-10-CM | POA: Diagnosis not present

## 2022-02-23 NOTE — Patient Instructions (Addendum)
Terry Wright , Thank you for taking time to come for your Medicare Wellness Visit. I appreciate your ongoing commitment to your health goals. Please review the following plan we discussed and let me know if I can assist you in the future.   Screening recommendations/referrals: Colonoscopy: done 11/16/20 repeat every 10 years  Recommended yearly ophthalmology/optometry visit for glaucoma screening and checkup Recommended yearly dental visit for hygiene and checkup  Vaccinations: Influenza vaccine: done 04/05/21 repeat every year  Pneumococcal vaccine: Up to date Tdap vaccine: done 10/28/15 every 10 years  Shingles vaccine: completed 2/18, 12/23/17   Covid-19: completed 1/19, 2/9, 05/10/20  Advanced directives: Please bring a copy of your health care power of attorney and living will to the office at your convenience.  Conditions/risks identified: none at this time  Next appointment: Follow up in one year for your annual wellness visit.   Preventive Care 66 Years and Older, Male Preventive care refers to lifestyle choices and visits with your health care provider that can promote health and wellness. What does preventive care include? A yearly physical exam. This is also called an annual well check. Dental exams once or twice a year. Routine eye exams. Ask your health care provider how often you should have your eyes checked. Personal lifestyle choices, including: Daily care of your teeth and gums. Regular physical activity. Eating a healthy diet. Avoiding tobacco and drug use. Limiting alcohol use. Practicing safe sex. Taking low doses of aspirin every day. Taking vitamin and mineral supplements as recommended by your health care provider. What happens during an annual well check? The services and screenings done by your health care provider during your annual well check will depend on your age, overall health, lifestyle risk factors, and family history of disease. Counseling  Your  health care provider may ask you questions about your: Alcohol use. Tobacco use. Drug use. Emotional well-being. Home and relationship well-being. Sexual activity. Eating habits. History of falls. Memory and ability to understand (cognition). Work and work Statistician. Screening  You may have the following tests or measurements: Height, weight, and BMI. Blood pressure. Lipid and cholesterol levels. These may be checked every 5 years, or more frequently if you are over 27 years old. Skin check. Lung cancer screening. You may have this screening every year starting at age 67 if you have a 30-pack-year history of smoking and currently smoke or have quit within the past 15 years. Fecal occult blood test (FOBT) of the stool. You may have this test every year starting at age 68. Flexible sigmoidoscopy or colonoscopy. You may have a sigmoidoscopy every 5 years or a colonoscopy every 10 years starting at age 49. Prostate cancer screening. Recommendations will vary depending on your family history and other risks. Hepatitis C blood test. Hepatitis B blood test. Sexually transmitted disease (STD) testing. Diabetes screening. This is done by checking your blood sugar (glucose) after you have not eaten for a while (fasting). You may have this done every 1-3 years. Abdominal aortic aneurysm (AAA) screening. You may need this if you are a current or former smoker. Osteoporosis. You may be screened starting at age 9 if you are at high risk. Talk with your health care provider about your test results, treatment options, and if necessary, the need for more tests. Vaccines  Your health care provider may recommend certain vaccines, such as: Influenza vaccine. This is recommended every year. Tetanus, diphtheria, and acellular pertussis (Tdap, Td) vaccine. You may need a Td booster every 10  years. Zoster vaccine. You may need this after age 88. Pneumococcal 13-valent conjugate (PCV13) vaccine. One dose  is recommended after age 13. Pneumococcal polysaccharide (PPSV23) vaccine. One dose is recommended after age 56. Talk to your health care provider about which screenings and vaccines you need and how often you need them. This information is not intended to replace advice given to you by your health care provider. Make sure you discuss any questions you have with your health care provider. Document Released: 08/13/2015 Document Revised: 04/05/2016 Document Reviewed: 05/18/2015 Elsevier Interactive Patient Education  2017 [place] Prevention in the Home Falls can cause injuries. They can happen to people of all ages. There are many things you can do to make your home safe and to help prevent falls. What can I do on the outside of my home? Regularly fix the edges of walkways and driveways and fix any cracks. Remove anything that might make you trip as you walk through a door, such as a raised step or threshold. Trim any bushes or trees on the path to your home. Use bright outdoor lighting. Clear any walking paths of anything that might make someone trip, such as rocks or tools. Regularly check to see if handrails are loose or broken. Make sure that both sides of any steps have handrails. Any raised decks and porches should have guardrails on the edges. Have any leaves, snow, or ice cleared regularly. Use sand or salt on walking paths during winter. Clean up any spills in your garage right away. This includes oil or grease spills. What can I do in the bathroom? Use night lights. Install grab bars by the toilet and in the tub and shower. Do not use towel bars as grab bars. Use non-skid mats or decals in the tub or shower. If you need to sit down in the shower, use a plastic, non-slip stool. Keep the floor dry. Clean up any water that spills on the floor as soon as it happens. Remove soap buildup in the tub or shower regularly. Attach bath mats securely with double-sided non-slip rug  tape. Do not have throw rugs and other things on the floor that can make you trip. What can I do in the bedroom? Use night lights. Make sure that you have a light by your bed that is easy to reach. Do not use any sheets or blankets that are too big for your bed. They should not hang down onto the floor. Have a firm chair that has side arms. You can use this for support while you get dressed. Do not have throw rugs and other things on the floor that can make you trip. What can I do in the kitchen? Clean up any spills right away. Avoid walking on wet floors. Keep items that you use a lot in easy-to-reach places. If you need to reach something above you, use a strong step stool that has a grab bar. Keep electrical cords out of the way. Do not use floor polish or wax that makes floors slippery. If you must use wax, use non-skid floor wax. Do not have throw rugs and other things on the floor that can make you trip. What can I do with my stairs? Do not leave any items on the stairs. Make sure that there are handrails on both sides of the stairs and use them. Fix handrails that are broken or loose. Make sure that handrails are as long as the stairways. Check any carpeting to make sure  that it is firmly attached to the stairs. Fix any carpet that is loose or worn. Avoid having throw rugs at the top or bottom of the stairs. If you do have throw rugs, attach them to the floor with carpet tape. Make sure that you have a light switch at the top of the stairs and the bottom of the stairs. If you do not have them, ask someone to add them for you. What else can I do to help prevent falls? Wear shoes that: Do not have high heels. Have rubber bottoms. Are comfortable and fit you well. Are closed at the toe. Do not wear sandals. If you use a stepladder: Make sure that it is fully opened. Do not climb a closed stepladder. Make sure that both sides of the stepladder are locked into place. Ask someone to  hold it for you, if possible. Clearly mark and make sure that you can see: Any grab bars or handrails. First and last steps. Where the edge of each step is. Use tools that help you move around (mobility aids) if they are needed. These include: Canes. Walkers. Scooters. Crutches. Turn on the lights when you go into a dark area. Replace any light bulbs as soon as they burn out. Set up your furniture so you have a clear path. Avoid moving your furniture around. If any of your floors are uneven, fix them. If there are any pets around you, be aware of where they are. Review your medicines with your doctor. Some medicines can make you feel dizzy. This can increase your chance of falling. Ask your doctor what other things that you can do to help prevent falls. This information is not intended to replace advice given to you by your health care provider. Make sure you discuss any questions you have with your health care provider. Document Released: 05/13/2009 Document Revised: 12/23/2015 Document Reviewed: 08/21/2014 Elsevier Interactive Patient Education  2017 [employer].

## 2022-02-23 NOTE — Progress Notes (Addendum)
Virtual Visit via Telephone Note  I connected with  Terry Wright on 02/23/22 at  9:30 AM EDT by telephone and verified that I am speaking with the correct person using two identifiers.  Medicare Annual Wellness visit completed telephonically due to Covid-19 pandemic.   Persons participating in this call: This Health Coach and this patient.   Location: Patient: home Provider: office   I discussed the limitations, risks, security and privacy concerns of performing an evaluation and management service by telephone and the availability of in person appointments. The patient expressed understanding and agreed to proceed.  Unable to perform video visit due to video visit attempted and failed and/or patient does not have video capability.   Some vital signs may be absent or patient reported.   Willette Brace, LPN   Subjective:   Terry Wright is a 66 y.o. male who presents for an Initial Medicare Annual Wellness Visit.  Review of Systems     Cardiac Risk Factors include: advanced age (>29mn, >>34women);diabetes mellitus;hypertension;male gender;dyslipidemia     Objective:    There were no vitals filed for this visit. There is no height or weight on file to calculate BMI.     02/23/2022    9:40 AM 07/24/2021   12:16 PM 03/30/2020    2:34 PM 08/12/2019   10:47 PM 03/26/2018   12:37 AM 03/25/2018    4:09 PM 03/23/2017   11:20 AM  Advanced Directives  Does Patient Have a Medical Advance Directive? Yes No Yes No Yes No Yes  Type of AParamedicof AErhardLiving will  HFrankfort SquareLiving will  H[place] will  Does patient want to make changes to medical advance directive?     No - Patient declined  No - Patient declined  Copy of [place] Chart? No - copy requested    No - copy requested  No - copy requested  Would patient like information on  creating a medical advance directive?    No - Patient declined No - Patient declined No - Patient declined     Current Medications (verified) Outpatient Encounter Medications as of 02/23/2022  Medication Sig   acetaminophen (TYLENOL 8 HOUR ARTHRITIS PAIN) 650 MG CR tablet Take 650 mg by mouth every 8 (eight) hours as needed for pain.   amLODipine (NORVASC) 2.5 MG tablet Take 1 tablet (2.5 mg total) by mouth daily.   carvedilol (COREG) 12.5 MG tablet Take 1 tablet (12.5 mg total) by mouth 2 (two) times daily.   clopidogrel (PLAVIX) 75 MG tablet Take 1 tablet (75 mg total) by mouth daily.   cyclobenzaprine (FLEXERIL) 10 MG tablet cyclobenzaprine 10 mg tablet   LORazepam (ATIVAN) 0.5 MG tablet TAKE 1 TABLET BY MOUTH AT BEDTIME AS NEEDED FOR ANXIETY (DO NOT DRIVE FOR EIGHT HOURS AFTER TAKING)   nitroGLYCERIN (NITROSTAT) 0.4 MG SL tablet Place 1 tablet (0.4 mg total) under the tongue every 5 (five) minutes as needed for chest pain.   pantoprazole (PROTONIX) 40 MG tablet Take 1 tablet (40 mg total) by mouth daily.   rosuvastatin (CRESTOR) 40 MG tablet Take 1 tablet (40 mg total) by mouth daily.   tamsulosin (FLOMAX) 0.4 MG CAPS capsule Take 1 capsule (0.4 mg total) by mouth daily.   triamcinolone lotion (KENALOG) 0.1 % Apply 1 application topically 2 (two) times daily. Mix lotion with cream and apply twice daily to affected area  No facility-administered encounter medications on file as of 02/23/2022.    Allergies (verified) Metformin and Metformin and related   History: Past Medical History:  Diagnosis Date   Anginal pain ([place]) 2018   Anxiety    Arthritis    shoulders hips,    CAD S/P percutaneous coronary angioplasty 07/2016, 02/2018   a) 100% pCx -- DES PCI with 4.0 x 16 mm Synergy DES. 65% ostial OM 3 (relatively small caliber - Med Rx);; b) 03/27/18 PCI/DES to dLCX/OM3 x1. Patent pCX DES. 70% dRCA trifurcation lesion (Med Rx). Normal EF   COPD (chronic obstructive pulmonary disease) ([place])  2018   mild   DVT (deep venous thrombosis) (H[facility])    Right upper arm DVT on 2 occasions in the remote past   Dysrhythmia    V tac   Ejection fraction    LV function normal, echo, February, 2010   GERD (gastroesophageal reflux disease)    Hyperlipidemia    Hypertension    Mitral regurgitation    Mild, echo, February, 2010   Palpitations    Mild in the past   Peripheral vascular disease ([place]) 1980s   DVT   Pre-diabetes    Statin intolerance    Felt poorly after Lipitor, Crestor, TriCor   STEMI involving left circumflex coronary artery ([place]) 08/04/2016   Occluded very large caliber codominant circumflex - PCI with single Synergy DES   Tobacco abuse    Past Surgical History:  Procedure Laterality Date   CARDIAC CATHETERIZATION N/A 08/04/2016   Procedure: Left Heart Cath and Coronary Angiography;  Surgeon: Leonie Man, MD;  Location: [facility];  Service: Cardiovascular;  Laterality: N/A: 100% pCx --> PCI. residual 65% pOM3 (small).  ~EF 35-45%  with lateral HK.   CARDIAC CATHETERIZATION N/A 08/04/2016   Procedure: CORONARY STENT INTERVENTION;  Surgeon: Leonie Man, MD;  Location: [facility];  Service: Cardiovascular;  Laterality: N/A: pCx 100%-0%: Synergy DES 4.0 x 16 (4.6 mm)   CHONDROPLASTY Right 04/04/2017   Procedure: CHONDROPLASTY;  Surgeon: Dorna Leitz, MD;  Location: [facility];  Service: Orthopedics;  Laterality: Right;   CORONARY STENT INTERVENTION N/A 03/27/2018   Procedure: CORONARY STENT INTERVENTION;  Surgeon: Martinique, Peter M, MD;  Location: [facility];  Service: Cardiovascular:: dCx-OM3 95%-0%: DES PCI - Synergy 2.25 x 16 mm.   GANGLION CYST EXCISION     oct 2017   HERNIA REPAIR     umbilical hernia 2017 oct   KNEE ARTHROSCOPY Right 04/04/2017   Procedure: ARTHROSCOPY RIGHT KNEE, WITH MEIDAL FEMORAL CONDYLE PATELLAR MEDIAL FEMORAL JOINT PLICA EXCISION ;  Surgeon: Dorna Leitz, MD;  Location: [facility];  Service: Orthopedics;  Laterality: Right;   LEFT  HEART CATH AND CORONARY ANGIOGRAPHY N/A 03/27/2018   Procedure: LEFT HEART CATH AND CORONARY ANGIOGRAPHY;  Surgeon: Martinique, Peter M, MD;  Location: [facility];  Service: Cardiovascular:   2 vessel obstructive CAD: - 95% dLCx/OM3 --Successful PCI of the dLCx/OM3 with SYNERGY DES 2.25X16, -70% distal RCA at trifurcation (Med Management).  Widely patent pLCx DES. Low normal LV function (50-55%), Normal LVEDP    TOTAL HIP ARTHROPLASTY Left 04/08/2020   Procedure: TOTAL HIP ARTHROPLASTY ANTERIOR APPROACH;  Surgeon: Paralee Cancel, MD;  Location: [facility];  Service: Orthopedics;  Laterality: Left;  70 mins   TRANSTHORACIC ECHOCARDIOGRAM  08/05/2016   post STEMI: EF 40-45%. Mild concentric LVH. Severe HK of basal-mid inferolateral wall consistent with infarct in this distribution. GR 1 DD  TRANSTHORACIC ECHOCARDIOGRAM  01/11/2017   EF 50-55%. GR 1 DD. Mild to moderate LA dilation.   Family History  Problem Relation Age of Onset   Heart attack Mother        x2- lived into 59s   Hypertension Father        lived into 61s   Other Brother        killed in [place]   Social History   Socioeconomic History   Marital status: Married    Spouse name: [name]   Number of children: Not on file   Years of education: 12   Highest education level: High school graduate  Occupational History   Not on file  Tobacco Use   Smoking status: Former    Packs/day: 1.00    Years: 43.00    Total pack years: 43.00    Types: Cigarettes    Quit date: 2015    Years since quitting: 8.5   Smokeless tobacco: Never   Tobacco comments:    Former smoker quit 2015  Vaping Use   Vaping Use: Never used  Substance and Sexual Activity   Alcohol use: Yes    Alcohol/week: 7.0 standard drinks of alcohol    Types: 7 Glasses of wine per week    Comment: occasional   Drug use: No   Sexual activity: Not on file  Other Topics Concern   Not on file  Social History Narrative   Married 10 years in 2018. 2 step kids- 40 and  58. 3 kids but he does not have contact with. No grandkids      Retired Theatre stage manager.    Wife working [employer]      Hobbies: golf (needs shoulder replacement though, working in the yard, working out- was doing this regularly even before MI- walking/treadmill   Social Determinants of Radio broadcast assistant Strain: Low Risk  (02/23/2022)   Overall Financial Resource Strain (CARDIA)    Difficulty of Paying Living Expenses: Not hard at all  Food Insecurity: No Food Insecurity (02/23/2022)   Hunger Vital Sign    Worried About Running Out of Food in the Last Year: Never true    [place] in the Last Year: Never true  Transportation Needs: No Transportation Needs (02/23/2022)   PRAPARE - Hydrologist (Medical): No    Lack of Transportation (Non-Medical): No  Physical Activity: Insufficiently Active (02/23/2022)   Exercise Vital Sign    Days of Exercise per Week: 4 days    Minutes of Exercise per Session: 30 min  Stress: No Stress Concern Present (02/23/2022)   [place]    Feeling of Stress : Not at all  Social Connections: Moderately Integrated (02/23/2022)   Social Connection and Isolation Panel [NHANES]    Frequency of Communication with Friends and Family: More than three times a week    Frequency of Social Gatherings with Friends and Family: More than three times a week    Attends Religious Services: Never    Marine scientist or Organizations: Yes    Attends Archivist Meetings: 1 to 4 times per year    Marital Status: Married    Tobacco Counseling Counseling given: Not Answered Tobacco comments: Former smoker quit 2015   Clinical Intake:  Pre-visit preparation completed: Yes  Pain : No/denies pain     BMI - recorded: 27.86 Nutritional Status: BMI 25 -29 Overweight Nutritional  Risks: None Diabetes: Yes CBG done?: No Did pt. bring in CBG monitor  from home?: No  How often do you need to have someone help you when you read instructions, pamphlets, or other written materials from your doctor or pharmacy?: 1 - Never  Diabetic?Nutrition Risk Assessment:  Has the patient had any N/V/D within the last 2 months?  No  Does the patient have any non-healing wounds?  No  Has the patient had any unintentional weight loss or weight gain?  No   Diabetes:  Is the patient diabetic?  Yes  If diabetic, was a CBG obtained today?  No  Did the patient bring in their glucometer from home?  No  How often do you monitor your CBG's? N/a.   Financial Strains and Diabetes Management:  Are you having any financial strains with the device, your supplies or your medication? No .  Does the patient want to be seen by Chronic Care Management for management of their diabetes?  No  Would the patient like to be referred to a Nutritionist or for Diabetic Management?  No   Diabetic Exams:  Diabetic Eye Exam: Overdue for diabetic eye exam. Pt has been advised about the importance in completing this exam. Patient advised to call and schedule an eye exam. Diabetic Foot Exam: Completed 08/24/21 '  Interpreter Needed?: No  Information entered by :: Charlott Rakes, LPN   Activities of Daily Living    02/23/2022    9:40 AM  In your present state of health, do you have any difficulty performing the following activities:  Hearing? 0  Vision? 0  Difficulty concentrating or making decisions? 0  Walking or climbing stairs? 0  Dressing or bathing? 0  Doing errands, shopping? 0  Preparing Food and eating ? N  Using the Toilet? N  In the past six months, have you accidently leaked urine? N  Do you have problems with loss of bowel control? N  Managing your Medications? N  Managing your Finances? N  Housekeeping or managing your Housekeeping? N    Patient Care Team: Marin Olp, MD as PCP - General (Family Medicine) Leonie Man, MD as PCP -  Cardiology (Cardiology) Dorna Leitz, MD as Consulting Physician (Orthopedic Surgery) Richmond Campbell, MD as Consulting Physician (Gastroenterology)  Indicate any recent Medical Services you may have received from other than [facility] providers in the past year (date may be approximate).     Assessment:   This is a routine wellness examination for [place].  Hearing/Vision screen Hearing Screening - Comments:: Pt denies any hearing issues  Vision Screening - Comments:: Pt follows up with Dr Katy Fitch for annual eye exams   Dietary issues and exercise activities discussed: Current Exercise Habits: Home exercise routine, Type of exercise: Other - see comments, Time (Minutes): 30, Frequency (Times/Week): 4, Weekly Exercise (Minutes/Week): 120   Goals Addressed             This Visit's Progress    Patient Stated       None at this time        Depression Screen    02/23/2022    9:38 AM 08/24/2021   10:51 AM 03/15/2020    2:30 PM 09/16/2019    3:52 PM 05/01/2019   11:33 AM 07/22/2018    2:39 PM 07/02/2018    3:58 PM  PHQ 2/9 Scores  PHQ - 2 Score 0 0 0 0 0 0 0  PHQ- 9 Score   0  Fall Risk    02/23/2022    9:40 AM 04/05/2021    9:55 AM 03/15/2020    2:30 PM 05/01/2019   11:34 AM 08/22/2016   11:40 AM  Fall Risk   Falls in the past year? 0 0 0 0 No  Number falls in past yr: 0 0 0 0   Injury with Fall? 0 0 0 0   Risk for fall due to : Impaired vision No Fall Risks     Follow up Falls prevention discussed Falls evaluation completed       FALL RISK PREVENTION PERTAINING TO THE HOME:  Any stairs in or around the home? Yes  If so, are there any without handrails? No  Home free of loose throw rugs in walkways, pet beds, electrical cords, etc? Yes  Adequate lighting in your home to reduce risk of falls? Yes   ASSISTIVE DEVICES UTILIZED TO PREVENT FALLS:  Life alert? No  Use of a cane, walker or w/c? No  Grab bars in the bathroom? No  Shower chair or bench in shower? Yes   Elevated toilet seat or a handicapped toilet? No   TIMED UP AND GO:  Was the test performed? No .   Cognitive Function:        02/23/2022    9:42 AM  6CIT Screen  What Year? 0 points  What month? 0 points  What time? 0 points  Count back from 20 0 points  Months in reverse 4 points  Repeat phrase 0 points  Total Score 4 points    Immunizations Immunization History  Administered Date(s) Administered   Fluad Quad(high Dose 65+) 04/05/2021   Influenza,inj,Quad PF,6+ Mos 04/28/2019   Influenza-Unspecified 05/17/2016, 05/02/2017, 04/15/2018, 04/28/2019, 05/07/2020   PFIZER(Purple Top)SARS-COV-2 Vaccination 08/19/2019, 09/09/2019, 05/10/2020   PNEUMOCOCCAL CONJUGATE-20 08/24/2021   Tdap 10/28/2015   Zoster Recombinat (Shingrix) 09/17/2017, 12/23/2017    TDAP status: Up to date  Flu Vaccine status: Up to date  Pneumococcal vaccine status: Up to date  Covid-19 vaccine status: Completed vaccines  Qualifies for Shingles Vaccine? Yes   Zostavax completed Yes   Shingrix Completed?: Yes  Screening Tests Health Maintenance  Topic Date Due   OPHTHALMOLOGY EXAM  Never done   COVID-19 Vaccine (4 - Pfizer series) 03/09/2022 (Originally 07/05/2020)   INFLUENZA VACCINE  02/28/2022   FOOT EXAM  08/24/2022   HEMOGLOBIN A1C  08/24/2022   URINE MICROALBUMIN  08/24/2022   TETANUS/TDAP  10/27/2025   COLONOSCOPY (Pts 45-22yr Insurance coverage will need to be confirmed)  11/17/2030   Pneumonia Vaccine 66 Years old  Completed   Hepatitis C Screening  Completed   Zoster Vaccines- Shingrix  Completed   HPV VACCINES  Aged Out    Health Maintenance  Health Maintenance Due  Topic Date Due   OPHTHALMOLOGY EXAM  Never done    Colorectal cancer screening: Type of screening: Colonoscopy. Completed 11/16/20. Repeat every 10 years   Additional Screening:  Hepatitis C Screening:  Completed 01/25/17  Vision Screening: Recommended annual ophthalmology exams for early detection of  glaucoma and other disorders of the eye. Is the patient up to date with their annual eye exam?  Yes  Who is the provider or what is the name of the office in which the patient attends annual eye exams? [facility]  If pt is not established with a provider, would they like to be referred to a provider to establish care? No .   Dental Screening: Recommended annual dental exams for  proper oral hygiene  Community Resource Referral / Chronic Care Management: CRR required this visit?  No   CCM required this visit?  No      Plan:     I have personally reviewed and noted the following in the patient's chart:   Medical and social history Use of alcohol, tobacco or illicit drugs  Current medications and supplements including opioid prescriptions. Patient is not currently taking opioid prescriptions. Functional ability and status Nutritional status Physical activity Advanced directives List of other physicians Hospitalizations, surgeries, and ER visits in previous 12 months Vitals Screenings to include cognitive, depression, and falls Referrals and appointments  In addition, I have reviewed and discussed with patient certain preventive protocols, quality metrics, and best practice recommendations. A written personalized care plan for preventive services as well as general preventive health recommendations were provided to patient.     Willette Brace, LPN   03/26/785   Nurse Notes: none

## 2022-03-02 ENCOUNTER — Encounter: Payer: Self-pay | Admitting: Family Medicine

## 2022-03-08 DIAGNOSIS — N401 Enlarged prostate with lower urinary tract symptoms: Secondary | ICD-10-CM | POA: Diagnosis not present

## 2022-03-08 DIAGNOSIS — R3912 Poor urinary stream: Secondary | ICD-10-CM | POA: Diagnosis not present

## 2022-03-08 DIAGNOSIS — R350 Frequency of micturition: Secondary | ICD-10-CM | POA: Diagnosis not present

## 2022-03-13 DIAGNOSIS — M25551 Pain in right hip: Secondary | ICD-10-CM | POA: Diagnosis not present

## 2022-03-13 DIAGNOSIS — M1611 Unilateral primary osteoarthritis, right hip: Secondary | ICD-10-CM | POA: Diagnosis not present

## 2022-03-14 ENCOUNTER — Encounter: Payer: Self-pay | Admitting: Family Medicine

## 2022-03-14 ENCOUNTER — Telehealth: Payer: Self-pay | Admitting: *Deleted

## 2022-03-14 ENCOUNTER — Encounter: Payer: Self-pay | Admitting: Cardiology

## 2022-03-14 ENCOUNTER — Other Ambulatory Visit: Payer: Self-pay

## 2022-03-14 NOTE — Patient Instructions (Signed)
Visit Information  Thank you for taking time to visit with me today. Please don't hesitate to contact me if I can be of assistance to you.   Following are the goals we discussed today:   Goals Addressed             This Visit's Progress    COMPLETED: Care Coordination Activities - no follow up required       Care Coordination Interventions: Provided education to patient re: annual eye exam, Annual Wellness Visit, and care coordination services Reviewed medications with patient and discussed adherence Reviewed scheduled/upcoming provider appointments including Annual Wellness Visit 03/08/23, Dr. Yong Channel 04/18/22 Assessed social determinant of health barriers No further interventions needed          If you are experiencing a Mental Health or [place] or need someone to talk to, please call the Suicide and Crisis Lifeline: 988 call the [place] National Suicide Prevention Lifeline: 724-056-7590 or TTY: (857) 035-2799 TTY 867-381-6498) to talk to a trained counselor call 1-800-273-TALK (toll free, 24 hour hotline) go to [facility] [place] ([phone]) call 911   Patient verbalizes understanding of instructions and care plan provided today and agrees to view in [place]. Active MyChart status and patient understanding of how to access instructions and care plan via MyChart confirmed with patient.     No further follow up required:    Peter Garter RN, Jackquline [place], [facility] [phone]

## 2022-03-14 NOTE — Patient Outreach (Signed)
  Care Coordination   Initial Visit Note   03/14/2022 Name: Terry Wright MRN: [id number] DOB: 09/10/1955  Terry Wright is a 66 y.o. year old male who sees Yong Channel, Brayton Mars, MD for primary care. I spoke with  Areta Haber Cunanan by phone today  What matters to the patients health and wellness today?  I am doing good but I am having a hip replacement in October    Goals Addressed             This Visit's Progress    COMPLETED: Care Coordination Activities - no follow up required       Care Coordination Interventions: Provided education to patient re: annual eye exam, Annual Wellness Visit, and care coordination services Reviewed medications with patient and discussed adherence Reviewed scheduled/upcoming provider appointments including Annual Wellness Visit 03/08/23, Dr. Yong Channel 04/18/22 Assessed social determinant of health barriers No further interventions needed         SDOH assessments and interventions completed:  Yes  SDOH Interventions Today    Flowsheet Row Most Recent Value  SDOH Interventions   Food Insecurity Interventions Intervention Not Indicated  Transportation Interventions Intervention Not Indicated        Care Coordination Interventions Activated:  Yes  Care Coordination Interventions:  Yes, provided   Follow up plan: No further intervention required.   Encounter Outcome:  Pt. Visit Completed  Peter Garter RN, BSN,CCM, CDE Care Management Coordinator [facility] [phone]

## 2022-03-14 NOTE — Telephone Encounter (Signed)
   Pre-operative Risk Assessment    Patient Name: Terry Wright  DOB: 15-Jan-1956 MRN: [id number]      Request for Surgical Clearance    Procedure:   RIGHT TOTAL HIP ARTHROPLASTY  Date of Surgery:  Clearance 05/02/22                                 Surgeon:  DR. MATTHEW OLIN Surgeon's Group or Practice Name:  [facility] Phone number:  [phone] ATTN: Orson Slick Fax number:  [phone]   Type of Clearance Requested:   - Medical  - Pharmacy:  Hold Clopidogrel (Plavix)     Type of Anesthesia:  Spinal   Additional requests/questions:    [name]   03/14/2022, 6:04 PM

## 2022-03-15 ENCOUNTER — Telehealth: Payer: Self-pay | Admitting: *Deleted

## 2022-03-15 ENCOUNTER — Telehealth: Payer: Self-pay

## 2022-03-15 NOTE — Telephone Encounter (Signed)
   Name: Terry Wright  DOB: April 20, 1956  MRN: [id number]  Primary Cardiologist: [name], MD   Preoperative team, please contact this patient and set up a phone call appointment for further preoperative risk assessment. Please obtain consent and complete medication review. Thank you for your help.  I confirm that guidance regarding antiplatelet and oral anticoagulation therapy has been completed and, if necessary, noted below.  Per office protocol, if patient without new symptoms or concerns at time of their virtual visit, he may hold Plavix for 5 to 7 days prior to procedure.  Please resume Plavix as soon as possible postprocedure, at the discretion of the surgeon.  Lenna Sciara, NP 03/15/2022, 11:42 AM [place]

## 2022-03-15 NOTE — Telephone Encounter (Signed)
Patient is scheduled for OV for surgical clearance on 04/18/22 at 2:20 pm.

## 2022-03-15 NOTE — Telephone Encounter (Signed)
Pt agreeable to plan of care for tele pre op appt 04/10/22. Med rec and consent are done.

## 2022-03-15 NOTE — Telephone Encounter (Signed)
Please schedule surgery clearance OV for pt 10-14 days prior to 05/02/22.

## 2022-03-15 NOTE — Telephone Encounter (Signed)
Pt agreeable to plan of care for tele pre op appt 04/10/22. Med rec and consent are done.     Patient Consent for Virtual Visit        Terry Wright has provided verbal consent on 03/15/2022 for a virtual visit (video or telephone).   CONSENT FOR VIRTUAL VISIT FOR:  Terry Wright  By participating in this virtual visit I agree to the following:  I hereby voluntarily request, consent and authorize [facility] and its employed or contracted physicians, physician assistants, nurse practitioners or other licensed health care professionals (the Practitioner), to provide me with telemedicine health care services (the "Services") as deemed necessary by the treating Practitioner. I acknowledge and consent to receive the Services by the Practitioner via telemedicine. I understand that the telemedicine visit will involve communicating with the Practitioner through live audiovisual communication technology and the disclosure of certain medical information by electronic transmission. I acknowledge that I have been given the opportunity to request an in-person assessment or other available alternative prior to the telemedicine visit and am voluntarily participating in the telemedicine visit.  I understand that I have the right to withhold or withdraw my consent to the use of telemedicine in the course of my care at any time, without affecting my right to future care or treatment, and that the Practitioner or I may terminate the telemedicine visit at any time. I understand that I have the right to inspect all information obtained and/or recorded in the course of the telemedicine visit and may receive copies of available information for a reasonable fee.  I understand that some of the potential risks of receiving the Services via telemedicine include:  Delay or interruption in medical evaluation due to technological equipment failure or disruption; Information transmitted may not be  sufficient (e.g. poor resolution of images) to allow for appropriate medical decision making by the Practitioner; and/or  In rare instances, security protocols could fail, causing a breach of personal health information.  Furthermore, I acknowledge that it is my responsibility to provide information about my medical history, conditions and care that is complete and accurate to the best of my ability. I acknowledge that Practitioner's advice, recommendations, and/or decision may be based on factors not within their control, such as incomplete or inaccurate data provided by me or distortions of diagnostic images or specimens that may result from electronic transmissions. I understand that the practice of medicine is not an exact science and that Practitioner makes no warranties or guarantees regarding treatment outcomes. I acknowledge that a copy of this consent can be made available to me via my patient portal ([facility]), or I can request a printed copy by calling the office of [name].    I understand that my insurance will be billed for this visit.   I have read or had this consent read to me. I understand the contents of this consent, which adequately explains the benefits and risks of the Services being provided via telemedicine.  I have been provided ample opportunity to ask questions regarding this consent and the Services and have had my questions answered to my satisfaction. I give my informed consent for the services to be provided through the use of telemedicine in my medical care

## 2022-03-29 NOTE — Telephone Encounter (Signed)
Since I saw Terry Wright in May, he is fine for me to see him back prior to surgery.  Should be fine for surgery.  The PA should take care of it.  Okay to hold Plavix.  [name], MD

## 2022-03-30 NOTE — Telephone Encounter (Signed)
Patient states: -Based off of message below from PCP, he believed it to be clearance to cancel his OV with PCP for 09/19  Patient would like to make sure that he did the right thing in cancelling his OV with PCP since forms have already been completed by PCP.

## 2022-04-06 ENCOUNTER — Telehealth: Payer: Self-pay | Admitting: Family Medicine

## 2022-04-06 NOTE — Telephone Encounter (Signed)
Patient wants his rasuvastatin to be called in to his Herbalist ([place], [place] instead of local pharmacy.

## 2022-04-07 MED ORDER — ROSUVASTATIN CALCIUM 40 MG PO TABS: 40.0000 mg | ORAL_TABLET | Freq: Every day | ORAL | 3 refills | Status: DC

## 2022-04-07 NOTE — Telephone Encounter (Signed)
Rx sent to requested pharmacy below. 

## 2022-04-10 ENCOUNTER — Ambulatory Visit: Payer: PPO | Attending: Cardiology | Admitting: Physician Assistant

## 2022-04-10 DIAGNOSIS — Z01810 Encounter for preprocedural cardiovascular examination: Secondary | ICD-10-CM

## 2022-04-10 NOTE — Progress Notes (Signed)
Virtual Visit via Telephone Note   Because of Terry Wright's co-morbid illnesses, he is at least at moderate risk for complications without adequate follow up.  This format is felt to be most appropriate for this patient at this time.  The patient did not have access to video technology/had technical difficulties with video requiring transitioning to audio format only (telephone).  All issues noted in this document were discussed and addressed.  No physical exam could be performed with this format.  Please refer to the patient's chart for his consent to telehealth for [facility].  Evaluation Performed:  Preoperative cardiovascular risk assessment _____________   Date:  04/10/2022   Patient ID:  Terry Wright, DOB 10-21-1955, MRN [id number] Patient Location:  Home Provider location:   Office  Primary Care Provider:  Marin Olp, MD Primary Cardiologist:  [name], MD  Chief Complaint / Patient Profile   66 y.o. y/o male with a h/o CAD status post inferior STEMI (complicated by VT arrest January 2018 with PCI to the left circumflex, August 2019 PCI to distal LCx and OM 3) who is pending right total hip arthroplasty and presents today for telephonic preoperative cardiovascular risk assessment.  Past Medical History    Past Medical History:  Diagnosis Date   Anginal pain ([facility]) 2018   Anxiety    Arthritis    shoulders hips,    CAD S/P percutaneous coronary angioplasty 07/2016, 02/2018   a) 100% pCx -- DES PCI with 4.0 x 16 mm Synergy DES. 65% ostial OM 3 (relatively small caliber - Med Rx);; b) 03/27/18 PCI/DES to dLCX/OM3 x1. Patent pCX DES. 70% dRCA trifurcation lesion (Med Rx). Normal EF   COPD (chronic obstructive pulmonary disease) ([place]) 2018   mild   DVT (deep venous thrombosis) (H[facility])    Right upper arm DVT on 2 occasions in the remote past   Dysrhythmia    V tac   Ejection fraction    LV function normal, echo, February, 2010   GERD  (gastroesophageal reflux disease)    Hyperlipidemia    Hypertension    Mitral regurgitation    Mild, echo, February, 2010   Palpitations    Mild in the past   Peripheral vascular disease ([place]) 1980s   DVT   Pre-diabetes    Statin intolerance    Felt poorly after Lipitor, Crestor, TriCor   STEMI involving left circumflex coronary artery ([facility]) 08/04/2016   Occluded very large caliber codominant circumflex - PCI with single Synergy DES   Tobacco abuse    Past Surgical History:  Procedure Laterality Date   CARDIAC CATHETERIZATION N/A 08/04/2016   Procedure: Left Heart Cath and Coronary Angiography;  Surgeon: Leonie Man, MD;  Location: [facility];  Service: Cardiovascular;  Laterality: N/A: 100% pCx --> PCI. residual 65% pOM3 (small).  ~EF 35-45%  with lateral HK.   CARDIAC CATHETERIZATION N/A 08/04/2016   Procedure: CORONARY STENT INTERVENTION;  Surgeon: Leonie Man, MD;  Location: [facility];  Service: Cardiovascular;  Laterality: N/A: pCx 100%-0%: Synergy DES 4.0 x 16 (4.6 mm)   CHONDROPLASTY Right 04/04/2017   Procedure: CHONDROPLASTY;  Surgeon: Dorna Leitz, MD;  Location: [facility];  Service: Orthopedics;  Laterality: Right;   CORONARY STENT INTERVENTION N/A 03/27/2018   Procedure: CORONARY STENT INTERVENTION;  Surgeon: Martinique, Peter M, MD;  Location: [facility];  Service: Cardiovascular:: dCx-OM3 95%-0%: DES PCI - Synergy 2.25 x 16 mm.   GANGLION CYST  EXCISION     oct 2017   HERNIA REPAIR     umbilical hernia 2017 oct   KNEE ARTHROSCOPY Right 04/04/2017   Procedure: ARTHROSCOPY RIGHT KNEE, WITH MEIDAL FEMORAL CONDYLE PATELLAR MEDIAL FEMORAL JOINT PLICA EXCISION ;  Surgeon: Dorna Leitz, MD;  Location: [facility];  Service: Orthopedics;  Laterality: Right;   LEFT HEART CATH AND CORONARY ANGIOGRAPHY N/A 03/27/2018   Procedure: LEFT HEART CATH AND CORONARY ANGIOGRAPHY;  Surgeon: Martinique, Peter M, MD;  Location: [facility];  Service: Cardiovascular:   2 vessel  obstructive CAD: - 95% dLCx/OM3 --Successful PCI of the dLCx/OM3 with SYNERGY DES 2.25X16, -70% distal RCA at trifurcation (Med Management).  Widely patent pLCx DES. Low normal LV function (50-55%), Normal LVEDP    TOTAL HIP ARTHROPLASTY Left 04/08/2020   Procedure: TOTAL HIP ARTHROPLASTY ANTERIOR APPROACH;  Surgeon: Paralee Cancel, MD;  Location: [facility];  Service: Orthopedics;  Laterality: Left;  70 mins   TRANSTHORACIC ECHOCARDIOGRAM  08/05/2016   post STEMI: EF 40-45%. Mild concentric LVH. Severe HK of basal-mid inferolateral wall consistent with infarct in this distribution. GR 1 DD   TRANSTHORACIC ECHOCARDIOGRAM  01/11/2017   EF 50-55%. GR 1 DD. Mild to moderate LA dilation.    Allergies  Allergies  Allergen Reactions   Metformin     Other reaction(s): Other (See Comments) After starting metformin- Severe hip pain bilaterally worse on left. Could not turn neck. Back pain. Symptoms drastically improved off metformin   Metformin And Related     After starting metformin- Severe hip pain bilaterally worse on left. Could not turn neck. Back pain. Symptoms drastically improved off metformin    History of Present Illness    Terry Wright is a 66 y.o. male who presents via audio/video conferencing for a telehealth visit today.  Pt was last seen in cardiology clinic on 12/05/2021 by Dr. Ellyn Hack.  At that time Terry Wright was doing well without any further angina.  The patient is now pending procedure as outlined above. Since his last visit, he tells me he has been doing really good from a heart standpoint.  He is limited by his hip.  Before about a month ago he was doing 10,000 steps without an issue.  He had his left hip done in 2021 but now needs the right hip done.  He tells me he does have issues with walking 1-2 blocks but he does all his own housework and does some yard work.  For this reason he scored a 4.61 METS on the DASI.  This exceeds the minimum requirement of 4 METS.   He denies chest pain and shortness of breath.   Per office protocol, patient is okay to hold Plavix for 5 days prior to the procedure.  Please resume when medically safe to do so.  Home Medications    Prior to Admission medications   Medication Sig Start Date End Date Taking? Authorizing Provider  acetaminophen (TYLENOL 8 HOUR ARTHRITIS PAIN) 650 MG CR tablet Take 650 mg by mouth every 8 (eight) hours as needed for pain.    [provider]  amLODipine (NORVASC) 2.5 MG tablet Take 1 tablet (2.5 mg total) by mouth daily. 04/05/21   Marin Olp, MD  carvedilol (COREG) 12.5 MG tablet Take 1 tablet (12.5 mg total) by mouth 2 (two) times daily. 10/28/21   Leonie Man, MD  clopidogrel (PLAVIX) 75 MG tablet Take 1 tablet (75 mg total) by mouth daily. 10/28/21  Leonie Man, MD  cyclobenzaprine (FLEXERIL) 10 MG tablet cyclobenzaprine 10 mg tablet    [provider]  LORazepam (ATIVAN) 0.5 MG tablet TAKE 1 TABLET BY MOUTH AT BEDTIME AS NEEDED FOR ANXIETY (DO NOT DRIVE FOR EIGHT HOURS AFTER TAKING) 01/23/22   Marin Olp, MD  nitroGLYCERIN (NITROSTAT) 0.4 MG SL tablet Place 1 tablet (0.4 mg total) under the tongue every 5 (five) minutes as needed for chest pain. 10/11/20   Marin Olp, MD  pantoprazole (PROTONIX) 40 MG tablet Take 1 tablet (40 mg total) by mouth daily. 11/01/21   Leonie Man, MD  rosuvastatin (CRESTOR) 40 MG tablet Take 1 tablet (40 mg total) by mouth daily. 04/07/22   Marin Olp, MD  silodosin (RAPAFLO) 8 MG CAPS capsule Take 8 mg by mouth daily. 03/08/22   [provider]  tamsulosin (FLOMAX) 0.4 MG CAPS capsule Take 1 capsule (0.4 mg total) by mouth daily. Patient not taking: Reported on 03/15/2022 02/21/22   Marin Olp, MD  triamcinolone lotion (KENALOG) 0.1 % Apply 1 application topically 2 (two) times daily. Mix lotion with cream and apply twice daily to affected area    [provider]    Physical Exam     Vital Signs:  Terry Wright does have vital signs available for review today.  BP: 118/78  Given telephonic nature of communication, physical exam is limited. AAOx3. NAD. Normal affect.  Speech and respirations are unlabored.  Accessory Clinical Findings    None  Assessment & Plan    1.  Preoperative Cardiovascular Risk Assessment:  Terry Wright perioperative risk of a major cardiac event is 0.9% according to the Revised Cardiac Risk Index (RCRI).  Therefore, he is at low risk for perioperative complications.   His functional capacity is fair at 4.61 METs according to the [facility] Activity Status Index (DASI). Recommendations: According to ACC/AHA guidelines, no further cardiovascular testing needed.  The patient may proceed to surgery at acceptable risk.   Antiplatelet and/or Anticoagulation Recommendations: Clopidogrel (Plavix) can be held for 5 days prior to his surgery and resumed as soon as possible post op.  A copy of this note will be routed to requesting surgeon.  Time:   Today, I have spent 10 minutes with the patient with telehealth technology discussing medical history, symptoms, and management plan.     Elgie Collard, PA-C  04/10/2022, 8:48 AM

## 2022-04-11 ENCOUNTER — Telehealth: Payer: Self-pay | Admitting: Family Medicine

## 2022-04-11 NOTE — Telephone Encounter (Signed)
Returned call to  Terry Wright and dose clarification given.

## 2022-04-11 NOTE — Telephone Encounter (Signed)
Terry Wright with [facility] requests to be called at ph# [phone] to be given clarification of dosage for RX received for rosuvastatin (CRESTOR) 40 MG tablet  Reference # [id number]

## 2022-04-18 ENCOUNTER — Ambulatory Visit: Payer: PPO | Admitting: Family Medicine

## 2022-04-19 ENCOUNTER — Telehealth: Payer: Self-pay | Admitting: Cardiology

## 2022-04-19 NOTE — Progress Notes (Signed)
COVID Vaccine Completed: yes x3  Date of COVID positive in last 90 days:  PCP - Garret Reddish, MD Cardiologist - [name], MD  Cardiac clearance by Nicholes Rough 04/10/22 in Epic  Chest x-ray - CT 07/29/22 Epic EKG - 12/25/21 Epic Stress Test -  ECHO - 2018 Cardiac Cath - 03/27/18 Epic Pacemaker/ICD device last checked: Spinal Cord Stimulator:  Bowel Prep -   Sleep Study -  CPAP -   Fasting Blood Sugar -  Checks Blood Sugar _____ times a day  Blood Thinner Instructions: Plavix, hold  5 days  Aspirin Instructions: Last Dose:  Activity level:  Can go up a flight of stairs and perform activities of daily living without stopping and without symptoms of chest pain or shortness of breath.  Able to exercise without symptoms  Unable to go up a flight of stairs without symptoms of     Anesthesia review: HTN, ventricular bigeminy, CAD, DM, cardiomyopathy, STEMI, aortic atherosclerosis   Patient denies shortness of breath, fever, cough and chest pain at PAT appointment  Patient verbalized understanding of instructions that were given to them at the PAT appointment. Patient was also instructed that they will need to review over the PAT instructions again at home before surgery.

## 2022-04-19 NOTE — Patient Instructions (Signed)
SURGICAL WAITING ROOM VISITATION Patients having surgery or a procedure may have no more than 2 support people in the waiting area - these visitors may rotate.   Children under the age of 53 must have an adult with them who is not the patient. If the patient needs to stay at the hospital during part of their recovery, the visitor guidelines for inpatient rooms apply. Pre-op nurse will coordinate an appropriate time for 1 support person to accompany patient in pre-op.  This support person may not rotate.    Please refer to the [facility] website for the visitor guidelines for Inpatients (after your surgery is over and you are in a regular room).    Your procedure is scheduled on: 05/02/22   Report to [facility] Main Entrance    Report to admitting at 9:10 AM   Call this number if you have problems the morning of surgery [phone]   Do not eat food :After Midnight.   After Midnight you may have the following liquids until 8:40 AM DAY OF SURGERY  Water Non-Citrus Juices (without pulp, NO RED) Carbonated Beverages Black Coffee (NO MILK/CREAM OR CREAMERS, sugar ok)  Clear Tea (NO MILK/CREAM OR CREAMERS, sugar ok) regular and decaf                             Plain Jell-O (NO RED)                                           Fruit ices (not with fruit pulp, NO RED)                                     Popsicles (NO RED)                                                               Sports drinks like Gatorade (NO RED)             The day of surgery:  Drink ONE (1) Pre-Surgery G2 at 8:40 AM the morning of surgery. Drink in one sitting. Do not sip.  This drink was given to you during your hospital  pre-op appointment visit. Nothing else to drink after completing the  Pre-Surgery G2.          If you have questions, please contact your surgeon's office.   FOLLOW BOWEL PREP AND ANY ADDITIONAL PRE OP INSTRUCTIONS YOU RECEIVED FROM YOUR SURGEON'S OFFICE!!!     Oral Hygiene is  also important to reduce your risk of infection.                                    Remember - BRUSH YOUR TEETH THE MORNING OF SURGERY WITH YOUR REGULAR TOOTHPASTE   Take these medicines the morning of surgery with A SIP OF WATER: Tylenol, Amlodipine, Carvedilol, Pantoprazole, Rosuvastatin, Tramadol   These are anesthesia recommendations for holding your anticoagulants.  Please contact your prescribing physician to confirm IF it  is safe to hold your anticoagulants for this length of time.   Eliquis Apixaban   72 hours   Xarelto Rivaroxaban   72 hours  Plavix Clopidogrel   120 hours  Pletal Cilostazol   120 hours                                 You may not have any metal on your body including jewelry, and body piercing             Do not wear lotions, powders, cologne, or deodorant              Men may shave face and neck.   Do not bring valuables to the hospital. Terry Wright.   Bring small overnight bag day of surgery.   DO NOT [place]. PHARMACY WILL DISPENSE MEDICATIONS LISTED ON YOUR MEDICATION LIST TO YOU DURING YOUR ADMISSION [place]!   Special Instructions: Bring a copy of your healthcare power of attorney and living will documents the day of surgery if you haven't scanned them before.              Please read over the following fact sheets you were given: IF [place] [name]   If you received a COVID test during your pre-op visit  it is requested that you wear a mask when out in public, stay away from anyone that may not be feeling well and notify your surgeon if you develop symptoms. If you test positive for Covid or have been in contact with anyone that has tested positive in the last 10 days please notify you surgeon.     [facility] - Preparing for Surgery Before surgery, you can play an important role.  Because  skin is not sterile, your skin needs to be as free of germs as possible.  You can reduce the number of germs on your skin by washing with CHG (chlorahexidine gluconate) soap before surgery.  CHG is an antiseptic cleaner which kills germs and bonds with the skin to continue killing germs even after washing. Please DO NOT use if you have an allergy to CHG or antibacterial soaps.  If your skin becomes reddened/irritated stop using the CHG and inform your nurse when you arrive at Short Stay. Do not shave (including legs and underarms) for at least 48 hours prior to the first CHG shower.  You may shave your face/neck.  Please follow these instructions carefully:  1.  Shower with CHG Soap the night before surgery and the  morning of surgery.  2.  If you choose to wash your hair, wash your hair first as usual with your normal  shampoo.  3.  After you shampoo, rinse your hair and body thoroughly to remove the shampoo.                             4.  Use CHG as you would any other liquid soap.  You can apply chg directly to the skin and wash.  Gently with a scrungie or clean washcloth.  5.  Apply the CHG Soap to your body ONLY FROM THE NECK DOWN.   Do   not use on  face/ open                           Wound or open sores. Avoid contact with eyes, ears mouth and   genitals (private parts).                       Wash face,  Genitals (private parts) with your normal soap.             6.  Wash thoroughly, paying special attention to the area where your    surgery  will be performed.  7.  Thoroughly rinse your body with warm water from the neck down.  8.  DO NOT shower/wash with your normal soap after using and rinsing off the CHG Soap.                9.  Pat yourself dry with a clean towel.            10.  Wear clean pajamas.            11.  Place clean sheets on your bed the night of your first shower and do not  sleep with pets. Day of Surgery : Do not apply any lotions/deodorants the morning of surgery.   Please wear clean clothes to the hospital/surgery center.  FAILURE TO FOLLOW THESE INSTRUCTIONS MAY RESULT IN THE CANCELLATION OF YOUR SURGERY  PATIENT SIGNATURE_________________________________  NURSE SIGNATURE__________________________________  ________________________________________________________________________   [name]  An incentive spirometer is a tool that can help keep your lungs clear and active. This tool measures how well you are filling your lungs with each breath. Taking long deep breaths may help reverse or decrease the chance of developing breathing (pulmonary) problems (especially infection) following: A long period of time when you are unable to move or be active. BEFORE THE PROCEDURE  If the spirometer includes an indicator to show your best effort, your nurse or respiratory therapist will set it to a desired goal. If possible, sit up straight or lean slightly forward. Try not to slouch. Hold the incentive spirometer in an upright position. INSTRUCTIONS FOR USE  Sit on the edge of your bed if possible, or sit up as far as you can in bed or on a chair. Hold the incentive spirometer in an upright position. Breathe out normally. Place the mouthpiece in your mouth and seal your lips tightly around it. Breathe in slowly and as deeply as possible, raising the piston or the ball toward the top of the column. Hold your breath for 3-5 seconds or for as long as possible. Allow the piston or ball to fall to the bottom of the column. Remove the mouthpiece from your mouth and breathe out normally. Rest for a few seconds and repeat Steps 1 through 7 at least 10 times every 1-2 hours when you are awake. Take your time and take a few normal breaths between deep breaths. The spirometer may include an indicator to show your best effort. Use the indicator as a goal to work toward during each repetition. After each set of 10 deep breaths, practice coughing to be sure your  lungs are clear. If you have an incision (the cut made at the time of surgery), support your incision when coughing by placing a pillow or rolled up towels firmly against it. Once you are able to get out of bed, walk around indoors and cough well. You may stop using the  incentive spirometer when instructed by your caregiver.  RISKS AND COMPLICATIONS Take your time so you do not get dizzy or light-headed. If you are in pain, you may need to take or ask for pain medication before doing incentive spirometry. It is harder to take a deep breath if you are having pain. AFTER USE Rest and breathe slowly and easily. It can be helpful to keep track of a log of your progress. Your caregiver can provide you with a simple table to help with this. If you are using the spirometer at home, follow these instructions: [place] IF:  You are having difficultly using the spirometer. You have trouble using the spirometer as often as instructed. Your pain medication is not giving enough relief while using the spirometer. You develop fever of 100.5 F (38.1 C) or higher. SEEK IMMEDIATE MEDICAL CARE IF:  You cough up bloody sputum that had not been present before. You develop fever of 102 F (38.9 C) or greater. You develop worsening pain at or near the incision site. MAKE SURE YOU:  Understand these instructions. Will watch your condition. Will get help right away if you are not doing well or get worse. Document Released: 11/27/2006 Document Revised: 10/09/2011 Document Reviewed: 01/28/2007 ExitCare Patient Information 2014 ExitCare, [place].   ________________________________________________________________________  WHAT IS A BLOOD TRANSFUSION? Blood Transfusion Information  A transfusion is the replacement of blood or some of its parts. Blood is made up of multiple cells which provide different functions. Red blood cells carry oxygen and are used for blood loss replacement. White blood cells fight  against infection. Platelets control bleeding. Plasma helps clot blood. Other blood products are available for specialized needs, such as hemophilia or other clotting disorders. BEFORE THE TRANSFUSION  Who gives blood for transfusions?  Healthy volunteers who are fully evaluated to make sure their blood is safe. This is blood bank blood. Transfusion therapy is the safest it has ever been in the practice of medicine. Before blood is taken from a donor, a complete history is taken to make sure that person has no history of diseases nor engages in risky social behavior (examples are intravenous drug use or sexual activity with multiple partners). The donor's travel history is screened to minimize risk of transmitting infections, such as malaria. The donated blood is tested for signs of infectious diseases, such as HIV and hepatitis. The blood is then tested to be sure it is compatible with you in order to minimize the chance of a transfusion reaction. If you or a relative donates blood, this is often done in anticipation of surgery and is not appropriate for emergency situations. It takes many days to process the donated blood. RISKS AND COMPLICATIONS Although transfusion therapy is very safe and saves many lives, the main dangers of transfusion include:  Getting an infectious disease. Developing a transfusion reaction. This is an allergic reaction to something in the blood you were given. Every precaution is taken to prevent this. The decision to have a blood transfusion has been considered carefully by your caregiver before blood is given. Blood is not given unless the benefits outweigh the risks. AFTER THE TRANSFUSION Right after receiving a blood transfusion, you will usually feel much better and more energetic. This is especially true if your red blood cells have gotten low (anemic). The transfusion raises the level of the red blood cells which carry oxygen, and this usually causes an energy  increase. The nurse administering the transfusion will monitor you carefully for  complications. HOME CARE INSTRUCTIONS  No special instructions are needed after a transfusion. You may find your energy is better. Speak with your caregiver about any limitations on activity for underlying diseases you may have. SEEK MEDICAL CARE IF:  Your condition is not improving after your transfusion. You develop redness or irritation at the intravenous (IV) site. SEEK IMMEDIATE MEDICAL CARE IF:  Any of the following symptoms occur over the next 12 hours: Shaking chills. You have a temperature by mouth above 102 F (38.9 C), not controlled by medicine. Chest, back, or muscle pain. People around you feel you are not acting correctly or are confused. Shortness of breath or difficulty breathing. Dizziness and fainting. You get a rash or develop hives. You have a decrease in urine output. Your urine turns a dark color or changes to pink, red, or brown. Any of the following symptoms occur over the next 10 days: You have a temperature by mouth above 102 F (38.9 C), not controlled by medicine. Shortness of breath. Weakness after normal activity. The white part of the eye turns yellow (jaundice). You have a decrease in the amount of urine or are urinating less often. Your urine turns a dark color or changes to pink, red, or brown. Document Released: 07/14/2000 Document Revised: 10/09/2011 Document Reviewed: 03/02/2008 [facility] Patient Information 2014 [place], [place].  _______________________________________________________________________

## 2022-04-19 NOTE — Telephone Encounter (Signed)
Calling to verify the clearance for patient blood thinner. Wanted to make sure it can be stop for 7 days and not 5 days. Please advise

## 2022-04-20 ENCOUNTER — Encounter (HOSPITAL_COMMUNITY): Payer: Self-pay

## 2022-04-20 ENCOUNTER — Encounter: Payer: Self-pay | Admitting: Cardiology

## 2022-04-20 ENCOUNTER — Encounter (HOSPITAL_COMMUNITY)
Admission: RE | Admit: 2022-04-20 | Discharge: 2022-04-20 | Disposition: A | Discharge status: Home / Self Care | Payer: PPO | Source: Ambulatory Visit | Attending: Orthopedic Surgery | Admitting: Orthopedic Surgery

## 2022-04-20 VITALS — BP 149/95 | HR 60 | Temp 97.8°F | Resp 16 | Ht 73.0 in | Wt 210.0 lb

## 2022-04-20 DIAGNOSIS — J449 Chronic obstructive pulmonary disease, unspecified: Secondary | ICD-10-CM | POA: Diagnosis not present

## 2022-04-20 DIAGNOSIS — Z01818 Encounter for other preprocedural examination: Secondary | ICD-10-CM | POA: Diagnosis not present

## 2022-04-20 DIAGNOSIS — M1611 Unilateral primary osteoarthritis, right hip: Secondary | ICD-10-CM | POA: Insufficient documentation

## 2022-04-20 DIAGNOSIS — E1159 Type 2 diabetes mellitus with other circulatory complications: Secondary | ICD-10-CM | POA: Diagnosis not present

## 2022-04-20 DIAGNOSIS — I2510 Atherosclerotic heart disease of native coronary artery without angina pectoris: Secondary | ICD-10-CM | POA: Insufficient documentation

## 2022-04-20 DIAGNOSIS — I10 Essential (primary) hypertension: Secondary | ICD-10-CM | POA: Insufficient documentation

## 2022-04-20 DIAGNOSIS — I252 Old myocardial infarction: Secondary | ICD-10-CM | POA: Insufficient documentation

## 2022-04-20 LAB — CBC
HCT: 44.4 % (ref 39.0–52.0)
Hemoglobin: 14.4 g/dL (ref 13.0–17.0)
MCH: 29.8 pg (ref 26.0–34.0)
MCHC: 32.4 g/dL (ref 30.0–36.0)
MCV: 91.7 fL (ref 80.0–100.0)
Platelets: 198 10*3/uL (ref 150–400)
RBC: 4.84 MIL/uL (ref 4.22–5.81)
RDW: 13.7 % (ref 11.5–15.5)
WBC: 7.1 10*3/uL (ref 4.0–10.5)
nRBC: 0 % (ref 0.0–0.2)

## 2022-04-20 LAB — BASIC METABOLIC PANEL
Anion gap: 7 (ref 5–15)
BUN: 26 mg/dL — ABNORMAL HIGH (ref 8–23)
CO2: 25 mmol/L (ref 22–32)
Calcium: 9.6 mg/dL (ref 8.9–10.3)
Chloride: 109 mmol/L (ref 98–111)
Creatinine, Ser: 0.95 mg/dL (ref 0.61–1.24)
GFR, Estimated: >60 mL/min (ref >60)
Glucose, Bld: 111 mg/dL — ABNORMAL HIGH (ref 70–99)
Potassium: 4.6 mmol/L (ref 3.5–5.1)
Sodium: 141 mmol/L (ref 135–145)

## 2022-04-20 LAB — HEMOGLOBIN A1C
Hgb A1c MFr Bld: 6.3 % — ABNORMAL HIGH (ref 4.8–5.6)
Mean Plasma Glucose: 134.11 mg/dL

## 2022-04-20 LAB — SURGICAL PCR SCREEN
MRSA, PCR: NEGATIVE
Staphylococcus aureus: POSITIVE — AB

## 2022-04-20 LAB — GLUCOSE, CAPILLARY: Glucose-Capillary: 114 mg/dL — ABNORMAL HIGH (ref 70–99)

## 2022-04-21 NOTE — Telephone Encounter (Signed)
   Patient Name: Terry Wright  DOB: Sep 17, 1955 MRN: [id number]  Primary Cardiologist: [name], MD  Chart reviewed as part of pre-operative protocol coverage. Pre-op clearance already addressed by colleagues in earlier phone notes. To summarize recommendations:  -Okay to hold clopidogrel/Plavix 7 days preop for surgeries or procedures.  -Dr. Ellyn Hack  Will route this bundled recommendation to requesting provider via Epic fax function and remove from pre-op pool. Please call with questions.  Elgie Collard, PA-C 04/21/2022, 8:54 AM

## 2022-04-21 NOTE — Telephone Encounter (Signed)
OK to hold Plavix 7 days  I think the Xarelto is a post-op dose -- so not as high. If they are planning to use the 10 mg dose, - would not restart Plavix until that has stopped -- use 81 mg ASA for that time frame.  [name], MD

## 2022-04-21 NOTE — Anesthesia Preprocedure Evaluation (Addendum)
Anesthesia Evaluation  Patient identified by MRN, date of birth, ID band Patient awake    Reviewed: Allergy & Precautions, NPO status , Patient's Chart, lab work & pertinent test results, reviewed documented beta blocker date and time   Airway Mallampati: III  TM Distance: >3 FB Neck ROM: Full    Dental no notable dental hx. (+) Teeth Intact, Dental Advisory Given   Pulmonary COPD, former smoker,    Pulmonary exam normal breath sounds clear to auscultation       Cardiovascular hypertension, Pt. on home beta blockers and Pt. on medications + angina + CAD, + Past MI, + Cardiac Stents, + Peripheral Vascular Disease and + DVT  Normal cardiovascular exam+ dysrhythmias Ventricular Tachycardia  Rhythm:Regular Rate:Normal  HTN, DVT, CAD status post inferior STEMI (complicated by VT arrest January 2018 with PCI to the left circumflex, August 2019 PCI to distal LCx and OM 3  Echo 01/11/2017  - Left ventricle: Mild lateral hypokinesis. The cavity size was  normal. Wall thickness was normal. Systolic function was normal.  The estimated ejection fraction was in the range of 50% to 55%.  Doppler parameters are consistent with abnormal left ventricular  relaxation (grade 1 diastolic dysfunction).  - Left atrium: The atrium was mildly to moderately dilated.  - Right atrium: The atrium was mildly dilated.    Neuro/Psych negative neurological ROS  negative psych ROS   GI/Hepatic Neg liver ROS, GERD  ,  Endo/Other  negative endocrine ROS  Renal/GU negative Renal ROS  negative genitourinary   Musculoskeletal  (+) Arthritis ,   Abdominal   Peds  Hematology  (+) Blood dyscrasia (on plavix, last dose 9/25), ,   Anesthesia Other Findings   Reproductive/Obstetrics                           Anesthesia Physical Anesthesia Plan  ASA: 3  Anesthesia Plan: Spinal   Post-op Pain Management: Tylenol PO  (pre-op)*   Induction:   PONV Risk Score and Plan: 1 and Treatment may vary due to age or medical condition, Midazolam, Ondansetron and Dexamethasone  Airway Management Planned: Natural Airway  Additional Equipment:   Intra-op Plan:   Post-operative Plan:   Informed Consent: I have reviewed the patients History and Physical, chart, labs and discussed the procedure including the risks, benefits and alternatives for the proposed anesthesia with the patient or authorized representative who has indicated his/her understanding and acceptance.     Dental advisory given  Plan Discussed with: CRNA  Anesthesia Plan Comments:        Anesthesia Quick Evaluation

## 2022-04-21 NOTE — Progress Notes (Signed)
PCR results sent to Dr. Olin to review.   

## 2022-04-21 NOTE — Telephone Encounter (Signed)
Patient is calling wanting to discuss xarelto and holding plavix today. Please advise.

## 2022-04-21 NOTE — Progress Notes (Signed)
Anesthesia Chart Review   Case: [id number] Date/Time: 05/02/22 1125   Procedure: TOTAL HIP ARTHROPLASTY ANTERIOR APPROACH (Right: Hip) - 90   Anesthesia type: Spinal   Pre-op diagnosis: Right hip osteoarthritis   Location: [facility] ROOM 10 / [facility]   Surgeons: Paralee Cancel, MD       DISCUSSION:66 y.o. former smoker with h/o HTN, DVT, CAD status post inferior STEMI (complicated by VT arrest January 2018 with PCI to the left circumflex, August 2019 PCI to distal LCx and OM 3), COPD, right hip OA scheduled for above procedure 05/02/2022 with Dr. Paralee Cancel.   Pt seen by cardiology 04/10/2022.  Per OV note, "Terry Wright's perioperative risk of a major cardiac event is 0.9% according to the Revised Cardiac Risk Index (RCRI).  Therefore, he is at low risk for perioperative complications.   His functional capacity is fair at 4.61 METs according to the Duke Activity Status Index (DASI). Recommendations: According to ACC/AHA guidelines, no further cardiovascular testing needed.  The patient may proceed to surgery at acceptable risk.   Antiplatelet and/or Anticoagulation Recommendations: Clopidogrel (Plavix) can be held for 5 days prior to his surgery and resumed as soon as possible post op."  Pt was advised to hold Plavix 7 days proir to procedure.  VS: BP (!) 149/95   Pulse 60   Temp 36.6 C (Oral)   Resp 16   Ht '6\' 1"'$  (1.854 m)   Wt 95.3 kg   SpO2 96%   BMI 27.71 kg/m   PROVIDERS: Marin Olp, MD is PCP   Primary Cardiologist: [name], MD LABS: Labs reviewed: Acceptable for surgery. (all labs ordered are listed, but only abnormal results are displayed)  Labs Reviewed  SURGICAL PCR SCREEN - Abnormal; Notable for the following components:      Result Value   Staphylococcus aureus POSITIVE (*)    All other components within normal limits  HEMOGLOBIN A1C - Abnormal; Notable for the following components:   Hgb A1c MFr Bld 6.3 (*)    All other components within normal limits   BASIC METABOLIC PANEL - Abnormal; Notable for the following components:   Glucose, Bld 111 (*)    BUN 26 (*)    All other components within normal limits  GLUCOSE, CAPILLARY - Abnormal; Notable for the following components:   Glucose-Capillary 114 (*)    All other components within normal limits  CBC  TYPE AND SCREEN     IMAGES:   EKG:   CV: Echo 01/11/2017  - Left ventricle: Mild lateral hypokinesis. The cavity size was    normal. Wall thickness was normal. Systolic function was normal.    The estimated ejection fraction was in the range of 50% to 55%.    Doppler parameters are consistent with abnormal left ventricular    relaxation (grade 1 diastolic dysfunction).  - Left atrium: The atrium was mildly to moderately dilated.  - Right atrium: The atrium was mildly dilated.  Past Medical History:  Diagnosis Date   Anginal pain ([place]) 2018   Anxiety    Arthritis    shoulders hips,    CAD S/P percutaneous coronary angioplasty 07/2016, 02/2018   a) 100% pCx -- DES PCI with 4.0 x 16 mm Synergy DES. 65% ostial OM 3 (relatively small caliber - Med Rx);; b) 03/27/18 PCI/DES to dLCX/OM3 x1. Patent pCX DES. 70% dRCA trifurcation lesion (Med Rx). Normal EF   COPD (chronic obstructive pulmonary disease) ([place]) 2018   mild   DVT (deep  venous thrombosis) ([facility])    Right upper arm DVT on 2 occasions in the remote past   Dysrhythmia    V tac   Ejection fraction    LV function normal, echo, February, 2010   GERD (gastroesophageal reflux disease)    Hyperlipidemia    Hypertension    Mitral regurgitation    Mild, echo, February, 2010   Palpitations    Mild in the past   Peripheral vascular disease ([place]) 1980s   DVT   Pre-diabetes    Statin intolerance    Felt poorly after Lipitor, Crestor, TriCor   STEMI involving left circumflex coronary artery ([place]) 08/04/2016   Occluded very large caliber codominant circumflex - PCI with single Synergy DES   Tobacco abuse     Past Surgical  History:  Procedure Laterality Date   CARDIAC CATHETERIZATION N/A 08/04/2016   Procedure: Left Heart Cath and Coronary Angiography;  Surgeon: Leonie Man, MD;  Location: [place] [facility];  Service: Cardiovascular;  Laterality: N/A: 100% pCx --> PCI. residual 65% pOM3 (small).  ~EF 35-45%  with lateral HK.   CARDIAC CATHETERIZATION N/A 08/04/2016   Procedure: CORONARY STENT INTERVENTION;  Surgeon: Leonie Man, MD;  Location: [place] [facility];  Service: Cardiovascular;  Laterality: N/A: pCx 100%-0%: Synergy DES 4.0 x 16 (4.6 mm)   CHONDROPLASTY Right 04/04/2017   Procedure: CHONDROPLASTY;  Surgeon: Dorna Leitz, MD;  [facility];  Service: Orthopedics;  Laterality: Right;   CORONARY STENT INTERVENTION N/A 03/27/2018   Procedure: CORONARY STENT INTERVENTION;  Surgeon: Martinique, Peter M, MD;  Location: [place] [facility];  Service: Cardiovascular:: dCx-OM3 95%-0%: DES PCI - Synergy 2.25 x 16 mm.   GANGLION CYST EXCISION     oct 2017   HERNIA REPAIR     umbilical hernia 2017 oct   KNEE ARTHROSCOPY Right 04/04/2017   Procedure: ARTHROSCOPY RIGHT KNEE, WITH MEIDAL FEMORAL CONDYLE PATELLAR MEDIAL FEMORAL JOINT PLICA EXCISION ;  Surgeon: Dorna Leitz, MD;  [facility];  Service: Orthopedics;  Laterality: Right;   LEFT HEART CATH AND CORONARY ANGIOGRAPHY N/A 03/27/2018   Procedure: LEFT HEART CATH AND CORONARY ANGIOGRAPHY;  Surgeon: Martinique, Peter M, MD;  Location: [place] [facility];  Service: Cardiovascular:   2 vessel obstructive CAD: - 95% dLCx/OM3 --Successful PCI of the dLCx/OM3 with SYNERGY DES 2.25X16, -70% distal RCA at trifurcation (Med Management).  Widely patent pLCx DES. Low normal LV function (50-55%), Normal LVEDP    TOTAL HIP ARTHROPLASTY Left 04/08/2020   Procedure: TOTAL HIP ARTHROPLASTY ANTERIOR APPROACH;  Surgeon: Paralee Cancel, MD;  [facility];  Service: Orthopedics;  Laterality: Left;  70 mins   TRANSTHORACIC ECHOCARDIOGRAM  08/05/2016   post STEMI: EF 40-45%. Mild  concentric LVH. Severe HK of basal-mid inferolateral wall consistent with infarct in this distribution. GR 1 DD   TRANSTHORACIC ECHOCARDIOGRAM  01/11/2017   EF 50-55%. GR 1 DD. Mild to moderate LA dilation.    MEDICATIONS:  acetaminophen (TYLENOL 8 HOUR ARTHRITIS PAIN) 650 MG CR tablet   amLODipine (NORVASC) 2.5 MG tablet   carvedilol (COREG) 12.5 MG tablet   clopidogrel (PLAVIX) 75 MG tablet   LORazepam (ATIVAN) 0.5 MG tablet   nitroGLYCERIN (NITROSTAT) 0.4 MG SL tablet   pantoprazole (PROTONIX) 40 MG tablet   rosuvastatin (CRESTOR) 40 MG tablet   traMADol (ULTRAM) 50 MG tablet   triamcinolone lotion (KENALOG) 0.1 %   No current facility-administered medications for this encounter.    Konrad Felix Ward, PA-C  [facility] [phone]

## 2022-04-24 ENCOUNTER — Encounter: Payer: Self-pay | Admitting: *Deleted

## 2022-05-01 NOTE — H&P (Signed)
TOTAL HIP ADMISSION H&P  Patient is admitted for right total hip arthroplasty.  Subjective:  Chief Complaint: right hip pain  HPI: Terry Wright, 66 y.o. male, has a history of pain and functional disability in the right hip(s) due to arthritis and patient has failed non-surgical conservative treatments for greater than 12 weeks to include NSAID's and/or analgesics and activity modification.  Onset of symptoms was gradual starting 2 years ago with gradually worsening course since that time.The patient noted no past surgery on the right hip(s).  Patient currently rates pain in the right hip at 8 out of 10 with activity. Patient has worsening of pain with activity and weight bearing, pain that interfers with activities of daily living, and pain with passive range of motion. Patient has evidence of joint space narrowing by imaging studies. This condition presents safety issues increasing the risk of falls. There is no current active infection.  Patient Active Problem List   Diagnosis Date Noted   COVID-19 virus infection 12/17/2020   Left hip OA 04/08/2020   S/P left THA, AA 04/08/2020   Unstable angina (HCC)    Centrilobular emphysema ([place]) 09/04/2017   Thoracic aortic atherosclerosis ([place]) 09/04/2017   Acute medial meniscus tear of right knee 04/04/2017   Chondromalacia, right knee 04/04/2017   Ischemic cardiomyopathy 10/12/2016   CAD S/P percutaneous coronary angioplasty 09/21/2016   Insomnia 09/21/2016   Diabetes mellitus with cardiac complication ([place]) 61/44/3154   STEMI involving left circumflex coronary artery ([place]) -complicated by cardiac arrest 08/04/2016   History of DVT (deep vein thrombosis)    Dyslipidemia, goal LDL below 50    GERD (gastroesophageal reflux disease)    Essential hypertension    Ventricular bigeminy    Former smoker    Past Medical History:  Diagnosis Date   Anginal pain ([place]) 2018   Anxiety    Arthritis    shoulders hips,    CAD S/P  percutaneous coronary angioplasty 07/2016, 02/2018   a) 100% pCx -- DES PCI with 4.0 x 16 mm Synergy DES. 65% ostial OM 3 (relatively small caliber - Med Rx);; b) 03/27/18 PCI/DES to dLCX/OM3 x1. Patent pCX DES. 70% dRCA trifurcation lesion (Med Rx). Normal EF   COPD (chronic obstructive pulmonary disease) ([place]) 2018   mild   DVT (deep venous thrombosis) (HCC)    Right upper arm DVT on 2 occasions in the remote past   Dysrhythmia    V tac   Ejection fraction    LV function normal, echo, February, 2010   GERD (gastroesophageal reflux disease)    Hyperlipidemia    Hypertension    Mitral regurgitation    Mild, echo, February, 2010   Palpitations    Mild in the past   Peripheral vascular disease ([place]) 1980s   DVT   Pre-diabetes    Statin intolerance    Felt poorly after Lipitor, Crestor, TriCor   STEMI involving left circumflex coronary artery ([place]) 08/04/2016   Occluded very large caliber codominant circumflex - PCI with single Synergy DES   Tobacco abuse     Past Surgical History:  Procedure Laterality Date   CARDIAC CATHETERIZATION N/A 08/04/2016   Procedure: Left Heart Cath and Coronary Angiography;  Surgeon: Leonie Man, MD;  Location: [facility];  Service: Cardiovascular;  Laterality: N/A: 100% pCx --> PCI. residual 65% pOM3 (small).  ~EF 35-45%  with lateral HK.   CARDIAC CATHETERIZATION N/A 08/04/2016   Procedure: CORONARY STENT INTERVENTION;  Surgeon: Leonie Man,  MD;  Location: [facility];  Service: Cardiovascular;  Laterality: N/A: pCx 100%-0%: Synergy DES 4.0 x 16 (4.6 mm)   CHONDROPLASTY Right 04/04/2017   Procedure: CHONDROPLASTY;  Surgeon: Dorna Leitz, MD;  Location: [facility];  Service: Orthopedics;  Laterality: Right;   CORONARY STENT INTERVENTION N/A 03/27/2018   Procedure: CORONARY STENT INTERVENTION;  Surgeon: Martinique, Peter M, MD;  Location: [facility];  Service: Cardiovascular:: dCx-OM3 95%-0%: DES PCI - Synergy 2.25 x 16 mm.   GANGLION CYST  EXCISION     oct 2017   HERNIA REPAIR     umbilical hernia 2017 oct   KNEE ARTHROSCOPY Right 04/04/2017   Procedure: ARTHROSCOPY RIGHT KNEE, WITH MEIDAL FEMORAL CONDYLE PATELLAR MEDIAL FEMORAL JOINT PLICA EXCISION ;  Surgeon: Dorna Leitz, MD;  Location: [facility];  Service: Orthopedics;  Laterality: Right;   LEFT HEART CATH AND CORONARY ANGIOGRAPHY N/A 03/27/2018   Procedure: LEFT HEART CATH AND CORONARY ANGIOGRAPHY;  Surgeon: Martinique, Peter M, MD;  Location: [facility];  Service: Cardiovascular:   2 vessel obstructive CAD: - 95% dLCx/OM3 --Successful PCI of the dLCx/OM3 with SYNERGY DES 2.25X16, -70% distal RCA at trifurcation (Med Management).  Widely patent pLCx DES. Low normal LV function (50-55%), Normal LVEDP    TOTAL HIP ARTHROPLASTY Left 04/08/2020   Procedure: TOTAL HIP ARTHROPLASTY ANTERIOR APPROACH;  Surgeon: Paralee Cancel, MD;  Location: [facility];  Service: Orthopedics;  Laterality: Left;  70 mins   TRANSTHORACIC ECHOCARDIOGRAM  08/05/2016   post STEMI: EF 40-45%. Mild concentric LVH. Severe HK of basal-mid inferolateral wall consistent with infarct in this distribution. GR 1 DD   TRANSTHORACIC ECHOCARDIOGRAM  01/11/2017   EF 50-55%. GR 1 DD. Mild to moderate LA dilation.    No current facility-administered medications for this encounter.   Current Outpatient Medications  Medication Sig Dispense Refill Last Dose   acetaminophen (TYLENOL 8 HOUR ARTHRITIS PAIN) 650 MG CR tablet Take 650 mg by mouth every 8 (eight) hours as needed for pain.      amLODipine (NORVASC) 2.5 MG tablet Take 1 tablet (2.5 mg total) by mouth daily. 90 tablet 3    carvedilol (COREG) 12.5 MG tablet Take 1 tablet (12.5 mg total) by mouth 2 (two) times daily. 180 tablet 1    clopidogrel (PLAVIX) 75 MG tablet Take 1 tablet (75 mg total) by mouth daily. 90 tablet 1    LORazepam (ATIVAN) 0.5 MG tablet TAKE 1 TABLET BY MOUTH AT BEDTIME AS NEEDED FOR ANXIETY (DO NOT DRIVE FOR EIGHT HOURS AFTER TAKING) 90 tablet 1     nitroGLYCERIN (NITROSTAT) 0.4 MG SL tablet Place 1 tablet (0.4 mg total) under the tongue every 5 (five) minutes as needed for chest pain. 25 tablet 1    pantoprazole (PROTONIX) 40 MG tablet Take 1 tablet (40 mg total) by mouth daily. 90 tablet 3    rosuvastatin (CRESTOR) 40 MG tablet Take 1 tablet (40 mg total) by mouth daily. 90 tablet 3    traMADol (ULTRAM) 50 MG tablet Take 50 mg by mouth daily as needed for severe pain.      triamcinolone lotion (KENALOG) 0.1 % Apply 1 application  topically 2 (two) times daily as needed (skin irritation). Mix lotion with cream and apply twice daily to affected area      Allergies  Allergen Reactions   Metformin     Other reaction(s): Other (See Comments) After starting metformin- Severe hip pain bilaterally worse on left. Could not turn neck.  Back pain. Symptoms drastically improved off metformin   Metformin And Related     After starting metformin- Severe hip pain bilaterally worse on left. Could not turn neck. Back pain. Symptoms drastically improved off metformin    Social History   Tobacco Use   Smoking status: Former    Packs/day: 1.00    Years: 43.00    Total pack years: 43.00    Types: Cigarettes    Quit date: 2015    Years since quitting: 8.7   Smokeless tobacco: Never   Tobacco comments:    Former smoker quit 2015  Substance Use Topics   Alcohol use: Yes    Alcohol/week: 7.0 standard drinks of alcohol    Types: 7 Glasses of wine per week    Comment: occasional    Family History  Problem Relation Age of Onset   Heart attack Mother        x2- lived into 41s   Hypertension Father        lived into 68s   Other Brother        killed in [place]     Review of Systems  Constitutional:  Negative for chills and fever.  Respiratory:  Negative for cough and shortness of breath.   Cardiovascular:  Negative for chest pain.  Gastrointestinal:  Negative for nausea and vomiting.  Musculoskeletal:  Positive for arthralgias.      Objective:  Physical Exam Well nourished and well developed. General: Alert and oriented x3, cooperative and pleasant, no acute distress. Head: normocephalic, atraumatic, neck supple. Eyes: EOMI.  Musculoskeletal: Right hip exam: Painful and limited hip flexion internal rotation over 5 degrees, external rotation over 20 degrees Active hip flexion without significant external rotation contracture He notes limitations with active hip flexion external rotation and abduction Neurovascular intact distally without lower extremity edema, erythema or calf tenderness  Calves soft and nontender. Motor function intact in LE. Strength 5/5 LE bilaterally. Neuro: Distal pulses 2+. Sensation to light touch intact in LE.  Vital signs in last 24 hours:    Labs:   Estimated body mass index is 27.71 kg/m as calculated from the following:   Height as of 04/20/22: '6\' 1"'$  (1.854 m).   Weight as of 04/20/22: 95.3 kg.   Imaging Review Plain radiographs demonstrate severe degenerative joint disease of the right hip(s). The bone quality appears to be adequate for age and reported activity level.      Assessment/Plan:  End stage arthritis, right hip(s)  The patient history, physical examination, clinical judgement of the provider and imaging studies are consistent with end stage degenerative joint disease of the right hip(s) and total hip arthroplasty is deemed medically necessary. The treatment options including medical management, injection therapy, arthroscopy and arthroplasty were discussed at length. The risks and benefits of total hip arthroplasty were presented and reviewed. The risks due to aseptic loosening, infection, stiffness, dislocation/subluxation,  thromboembolic complications and other imponderables were discussed.  The patient acknowledged the explanation, agreed to proceed with the plan and consent was signed. Patient is being admitted for inpatient treatment for surgery, pain  control, PT, OT, prophylactic antibiotics, VTE prophylaxis, progressive ambulation and ADL's and discharge planning.The patient is planning to be discharged  home.  Therapy Plans: HEP Disposition: Home with wife Planned DVT Prophylaxis: Plavix now (hx of DVT), Xarelto 10 mg DME needed: none PCP: Dr. Yong Channel, clearance received Cardiologist: Dr. Ellyn Hack, clearance received (hx of STEMI, PCI) TXA: IV Allergies: NKDA Anesthesia Concerns: none BMI: 28.6 Last  HgbA1c: 6.7%  Other: - Prior THA - difficult time with pain, swelling and eventual DVT dx - Planning to stay overnight - He plans to check with Cardio on Plavix & Xarelto together afterwards - did this prior due to stents - dilaudid (N/V with pain meds previously), robaxin, tylenol, zofran   Costella Hatcher, PA-C Orthopedic Surgery [facility] [phone]

## 2022-05-02 ENCOUNTER — Encounter (HOSPITAL_COMMUNITY): Payer: Self-pay | Admitting: Orthopedic Surgery

## 2022-05-02 ENCOUNTER — Observation Stay (HOSPITAL_COMMUNITY)
Admission: RE | Admit: 2022-05-02 | Discharge: 2022-05-05 | Disposition: A | Discharge status: Home / Self Care | Payer: PPO | Attending: Orthopedic Surgery | Admitting: Orthopedic Surgery

## 2022-05-02 ENCOUNTER — Ambulatory Visit (HOSPITAL_BASED_OUTPATIENT_CLINIC_OR_DEPARTMENT_OTHER): Payer: PPO | Admitting: Registered Nurse

## 2022-05-02 ENCOUNTER — Ambulatory Visit (HOSPITAL_COMMUNITY): Payer: PPO | Admitting: Physician Assistant

## 2022-05-02 ENCOUNTER — Ambulatory Visit (HOSPITAL_COMMUNITY): Payer: PPO

## 2022-05-02 ENCOUNTER — Observation Stay (HOSPITAL_COMMUNITY): Payer: PPO

## 2022-05-02 ENCOUNTER — Encounter (HOSPITAL_COMMUNITY)
Admission: RE | Disposition: A | Discharge status: Home / Self Care | Payer: Self-pay | Source: Home / Self Care | Attending: Orthopedic Surgery

## 2022-05-02 ENCOUNTER — Other Ambulatory Visit: Payer: Self-pay

## 2022-05-02 DIAGNOSIS — Z7902 Long term (current) use of antithrombotics/antiplatelets: Secondary | ICD-10-CM | POA: Diagnosis not present

## 2022-05-02 DIAGNOSIS — M1611 Unilateral primary osteoarthritis, right hip: Secondary | ICD-10-CM | POA: Diagnosis not present

## 2022-05-02 DIAGNOSIS — Z8616 Personal history of COVID-19: Secondary | ICD-10-CM | POA: Diagnosis not present

## 2022-05-02 DIAGNOSIS — Z86718 Personal history of other venous thrombosis and embolism: Secondary | ICD-10-CM | POA: Diagnosis not present

## 2022-05-02 DIAGNOSIS — Z96641 Presence of right artificial hip joint: Secondary | ICD-10-CM | POA: Diagnosis not present

## 2022-05-02 DIAGNOSIS — Z79899 Other long term (current) drug therapy: Secondary | ICD-10-CM | POA: Diagnosis not present

## 2022-05-02 DIAGNOSIS — E119 Type 2 diabetes mellitus without complications: Secondary | ICD-10-CM | POA: Diagnosis not present

## 2022-05-02 DIAGNOSIS — I2510 Atherosclerotic heart disease of native coronary artery without angina pectoris: Secondary | ICD-10-CM | POA: Diagnosis not present

## 2022-05-02 DIAGNOSIS — Z96642 Presence of left artificial hip joint: Secondary | ICD-10-CM | POA: Insufficient documentation

## 2022-05-02 DIAGNOSIS — J449 Chronic obstructive pulmonary disease, unspecified: Secondary | ICD-10-CM | POA: Insufficient documentation

## 2022-05-02 DIAGNOSIS — I10 Essential (primary) hypertension: Secondary | ICD-10-CM | POA: Diagnosis not present

## 2022-05-02 DIAGNOSIS — E1159 Type 2 diabetes mellitus with other circulatory complications: Secondary | ICD-10-CM

## 2022-05-02 DIAGNOSIS — I25119 Atherosclerotic heart disease of native coronary artery with unspecified angina pectoris: Secondary | ICD-10-CM | POA: Diagnosis not present

## 2022-05-02 DIAGNOSIS — Z955 Presence of coronary angioplasty implant and graft: Secondary | ICD-10-CM | POA: Diagnosis not present

## 2022-05-02 DIAGNOSIS — Z471 Aftercare following joint replacement surgery: Secondary | ICD-10-CM | POA: Diagnosis not present

## 2022-05-02 DIAGNOSIS — Z87891 Personal history of nicotine dependence: Secondary | ICD-10-CM | POA: Insufficient documentation

## 2022-05-02 DIAGNOSIS — I252 Old myocardial infarction: Secondary | ICD-10-CM | POA: Diagnosis not present

## 2022-05-02 HISTORY — PX: TOTAL HIP ARTHROPLASTY: SHX124

## 2022-05-02 LAB — TYPE AND SCREEN
ABO/RH(D): O POS
Antibody Screen: NEGATIVE

## 2022-05-02 LAB — GLUCOSE, CAPILLARY: Glucose-Capillary: 147 mg/dL — ABNORMAL HIGH (ref 70–99)

## 2022-05-02 SURGERY — ARTHROPLASTY, HIP, TOTAL, ANTERIOR APPROACH
Anesthesia: Spinal | Site: Hip | Laterality: Right

## 2022-05-02 MED ORDER — DOCUSATE SODIUM 100 MG PO CAPS
100.0000 mg | ORAL_CAPSULE | Freq: Two times a day (BID) | ORAL | Status: DC
  Administered 2022-05-02 – 2022-05-05 (×6): 100 mg via ORAL
  Filled 2022-05-02 (×6): qty 1

## 2022-05-02 MED ORDER — ROSUVASTATIN CALCIUM 20 MG PO TABS
40.0000 mg | ORAL_TABLET | Freq: Every day | ORAL | Status: DC
  Administered 2022-05-02 – 2022-05-05 (×4): 40 mg via ORAL
  Filled 2022-05-02 (×4): qty 2

## 2022-05-02 MED ORDER — ACETAMINOPHEN 500 MG PO TABS
ORAL_TABLET | ORAL | Status: AC
  Filled 2022-05-02: qty 2

## 2022-05-02 MED ORDER — FERROUS SULFATE 325 (65 FE) MG PO TABS
325.0000 mg | ORAL_TABLET | Freq: Three times a day (TID) | ORAL | Status: DC
  Administered 2022-05-05: 325 mg via ORAL
  Filled 2022-05-02 (×2): qty 1

## 2022-05-02 MED ORDER — PANTOPRAZOLE SODIUM 40 MG PO TBEC
40.0000 mg | DELAYED_RELEASE_TABLET | Freq: Every day | ORAL | Status: DC
  Administered 2022-05-03 – 2022-05-05 (×3): 40 mg via ORAL
  Filled 2022-05-02 (×2): qty 1

## 2022-05-02 MED ORDER — ORAL CARE MOUTH RINSE: 15.0000 mL | Freq: Once | OROMUCOSAL | Status: AC

## 2022-05-02 MED ORDER — CEFAZOLIN SODIUM-DEXTROSE 2-4 GM/100ML-% IV SOLN
2.0000 g | INTRAVENOUS | Status: AC
  Administered 2022-05-02: 2 g via INTRAVENOUS
  Filled 2022-05-02: qty 100

## 2022-05-02 MED ORDER — BUPIVACAINE IN DEXTROSE 0.75-8.25 % IT SOLN
INTRATHECAL | Status: DC | PRN
  Administered 2022-05-02: 2 mL via INTRATHECAL

## 2022-05-02 MED ORDER — ACETAMINOPHEN 325 MG PO TABS: 325.0000 mg | ORAL_TABLET | Freq: Four times a day (QID) | ORAL | Status: DC | PRN

## 2022-05-02 MED ORDER — CHLORHEXIDINE GLUCONATE 0.12 % MT SOLN
15.0000 mL | Freq: Once | OROMUCOSAL | Status: AC
  Administered 2022-05-02: 15 mL via OROMUCOSAL

## 2022-05-02 MED ORDER — DIPHENHYDRAMINE HCL 12.5 MG/5ML PO ELIX: 12.5000 mg | ORAL_SOLUTION | ORAL | Status: DC | PRN

## 2022-05-02 MED ORDER — PHENYLEPHRINE HCL-NACL 20-0.9 MG/250ML-% IV SOLN
INTRAVENOUS | Status: DC | PRN
  Administered 2022-05-02: 20 ug/min via INTRAVENOUS

## 2022-05-02 MED ORDER — METOCLOPRAMIDE HCL 5 MG/ML IJ SOLN
5.0000 mg | Freq: Three times a day (TID) | INTRAMUSCULAR | Status: DC | PRN
  Administered 2022-05-02: 10 mg via INTRAVENOUS

## 2022-05-02 MED ORDER — FENTANYL CITRATE PF 50 MCG/ML IJ SOSY
25.0000 ug | PREFILLED_SYRINGE | INTRAMUSCULAR | Status: DC | PRN
  Administered 2022-05-02: 50 ug via INTRAVENOUS

## 2022-05-02 MED ORDER — CEFAZOLIN SODIUM-DEXTROSE 2-4 GM/100ML-% IV SOLN
2.0000 g | Freq: Four times a day (QID) | INTRAVENOUS | Status: AC
  Administered 2022-05-02: 2 g via INTRAVENOUS
  Filled 2022-05-02 (×2): qty 100

## 2022-05-02 MED ORDER — POVIDONE-IODINE 10 % EX SWAB
2.0000 | Freq: Once | CUTANEOUS | Status: AC
  Administered 2022-05-02: 2 via TOPICAL

## 2022-05-02 MED ORDER — ACETAMINOPHEN 500 MG PO TABS: 1000.0000 mg | ORAL_TABLET | Freq: Once | ORAL | Status: DC

## 2022-05-02 MED ORDER — FENTANYL CITRATE PF 50 MCG/ML IJ SOSY
PREFILLED_SYRINGE | INTRAMUSCULAR | Status: AC
  Filled 2022-05-02: qty 1

## 2022-05-02 MED ORDER — MENTHOL 3 MG MT LOZG: 1.0000 | LOZENGE | OROMUCOSAL | Status: DC | PRN

## 2022-05-02 MED ORDER — SODIUM CHLORIDE 0.9 % IV SOLN: 12.5000 mg | Freq: Four times a day (QID) | INTRAVENOUS | Status: DC | PRN

## 2022-05-02 MED ORDER — METHOCARBAMOL 500 MG PO TABS
500.0000 mg | ORAL_TABLET | Freq: Four times a day (QID) | ORAL | Status: DC | PRN
  Administered 2022-05-03 – 2022-05-05 (×7): 500 mg via ORAL
  Filled 2022-05-02 (×7): qty 1

## 2022-05-02 MED ORDER — PROPOFOL 500 MG/50ML IV EMUL
INTRAVENOUS | Status: DC | PRN
  Administered 2022-05-02: 30 mg via INTRAVENOUS
  Administered 2022-05-02: 40 ug/kg/min via INTRAVENOUS

## 2022-05-02 MED ORDER — ONDANSETRON HCL 4 MG PO TABS: 4.0000 mg | ORAL_TABLET | Freq: Four times a day (QID) | ORAL | Status: DC | PRN

## 2022-05-02 MED ORDER — DEXAMETHASONE SODIUM PHOSPHATE 10 MG/ML IJ SOLN
8.0000 mg | Freq: Once | INTRAMUSCULAR | Status: AC
  Administered 2022-05-02: 10 mg via INTRAVENOUS

## 2022-05-02 MED ORDER — ONDANSETRON HCL 4 MG/2ML IJ SOLN
INTRAMUSCULAR | Status: AC
  Filled 2022-05-02: qty 2

## 2022-05-02 MED ORDER — BISACODYL 10 MG RE SUPP: 10.0000 mg | Freq: Every day | RECTAL | Status: DC | PRN

## 2022-05-02 MED ORDER — DEXAMETHASONE SODIUM PHOSPHATE 10 MG/ML IJ SOLN
10.0000 mg | Freq: Once | INTRAMUSCULAR | Status: AC
  Administered 2022-05-03: 10 mg via INTRAVENOUS
  Filled 2022-05-02: qty 1

## 2022-05-02 MED ORDER — SODIUM CHLORIDE 0.9 % IR SOLN
Status: DC | PRN
  Administered 2022-05-02: 1000 mL

## 2022-05-02 MED ORDER — ONDANSETRON HCL 4 MG/2ML IJ SOLN
4.0000 mg | Freq: Four times a day (QID) | INTRAMUSCULAR | Status: DC | PRN
  Administered 2022-05-02 – 2022-05-05 (×2): 4 mg via INTRAVENOUS
  Filled 2022-05-02 (×3): qty 2

## 2022-05-02 MED ORDER — LACTATED RINGERS IV SOLN: INTRAVENOUS | Status: DC

## 2022-05-02 MED ORDER — DEXAMETHASONE SODIUM PHOSPHATE 10 MG/ML IJ SOLN
INTRAMUSCULAR | Status: AC
  Filled 2022-05-02: qty 1

## 2022-05-02 MED ORDER — SODIUM CHLORIDE 0.9 % IV SOLN
12.5000 mg | Freq: Four times a day (QID) | INTRAVENOUS | Status: DC | PRN
  Administered 2022-05-02: 12.5 mg via INTRAVENOUS
  Filled 2022-05-02: qty 12.5

## 2022-05-02 MED ORDER — AMLODIPINE BESYLATE 5 MG PO TABS
2.5000 mg | ORAL_TABLET | Freq: Every day | ORAL | Status: DC
  Administered 2022-05-03 – 2022-05-05 (×3): 2.5 mg via ORAL
  Filled 2022-05-02 (×3): qty 1

## 2022-05-02 MED ORDER — HYDROMORPHONE HCL 2 MG PO TABS
2.0000 mg | ORAL_TABLET | ORAL | Status: DC | PRN
  Administered 2022-05-04: 2 mg via ORAL
  Filled 2022-05-02 (×2): qty 1

## 2022-05-02 MED ORDER — FENTANYL CITRATE (PF) 100 MCG/2ML IJ SOLN
INTRAMUSCULAR | Status: AC
  Filled 2022-05-02: qty 2

## 2022-05-02 MED ORDER — METOCLOPRAMIDE HCL 5 MG/ML IJ SOLN
INTRAMUSCULAR | Status: AC
  Filled 2022-05-02: qty 2

## 2022-05-02 MED ORDER — RIVAROXABAN 10 MG PO TABS
10.0000 mg | ORAL_TABLET | Freq: Every day | ORAL | Status: DC
  Administered 2022-05-03 – 2022-05-05 (×3): 10 mg via ORAL
  Filled 2022-05-02 (×3): qty 1

## 2022-05-02 MED ORDER — TRANEXAMIC ACID-NACL 1000-0.7 MG/100ML-% IV SOLN
1000.0000 mg | INTRAVENOUS | Status: AC
  Administered 2022-05-02: 1000 mg via INTRAVENOUS
  Filled 2022-05-02: qty 100

## 2022-05-02 MED ORDER — SODIUM CHLORIDE 0.9 % IV SOLN: INTRAVENOUS | Status: DC

## 2022-05-02 MED ORDER — ACETAMINOPHEN 500 MG PO TABS
1000.0000 mg | ORAL_TABLET | Freq: Four times a day (QID) | ORAL | Status: DC
  Administered 2022-05-02 – 2022-05-05 (×9): 1000 mg via ORAL
  Filled 2022-05-02 (×8): qty 2

## 2022-05-02 MED ORDER — LIDOCAINE 2% (20 MG/ML) 5 ML SYRINGE
INTRAMUSCULAR | Status: DC | PRN
  Administered 2022-05-02: 100 mg via INTRAVENOUS

## 2022-05-02 MED ORDER — ONDANSETRON HCL 4 MG/2ML IJ SOLN
INTRAMUSCULAR | Status: DC | PRN
  Administered 2022-05-02: 4 mg via INTRAVENOUS

## 2022-05-02 MED ORDER — METOCLOPRAMIDE HCL 5 MG PO TABS: 5.0000 mg | ORAL_TABLET | Freq: Three times a day (TID) | ORAL | Status: DC | PRN

## 2022-05-02 MED ORDER — HYDROMORPHONE HCL 2 MG PO TABS
1.0000 mg | ORAL_TABLET | ORAL | Status: DC | PRN
  Administered 2022-05-02 – 2022-05-05 (×11): 2 mg via ORAL
  Filled 2022-05-02 (×10): qty 1

## 2022-05-02 MED ORDER — MIDAZOLAM HCL 5 MG/5ML IJ SOLN
INTRAMUSCULAR | Status: DC | PRN
  Administered 2022-05-02: 2 mg via INTRAVENOUS

## 2022-05-02 MED ORDER — HYDROMORPHONE HCL 1 MG/ML IJ SOLN
0.5000 mg | INTRAMUSCULAR | Status: DC | PRN
  Administered 2022-05-02 (×2): 1 mg via INTRAVENOUS
  Filled 2022-05-02 (×2): qty 1

## 2022-05-02 MED ORDER — METHOCARBAMOL 500 MG IVPB - SIMPLE MED
500.0000 mg | Freq: Four times a day (QID) | INTRAVENOUS | Status: DC | PRN
  Administered 2022-05-02: 500 mg via INTRAVENOUS

## 2022-05-02 MED ORDER — METHOCARBAMOL 500 MG IVPB - SIMPLE MED
INTRAVENOUS | Status: AC
  Filled 2022-05-02: qty 55

## 2022-05-02 MED ORDER — POLYETHYLENE GLYCOL 3350 17 G PO PACK: 17.0000 g | PACK | Freq: Every day | ORAL | Status: DC | PRN

## 2022-05-02 MED ORDER — FENTANYL CITRATE (PF) 100 MCG/2ML IJ SOLN
INTRAMUSCULAR | Status: DC | PRN
  Administered 2022-05-02: 50 ug via INTRAVENOUS

## 2022-05-02 MED ORDER — MIDAZOLAM HCL 2 MG/2ML IJ SOLN
INTRAMUSCULAR | Status: AC
  Filled 2022-05-02: qty 2

## 2022-05-02 MED ORDER — PROPOFOL 1000 MG/100ML IV EMUL
INTRAVENOUS | Status: AC
  Filled 2022-05-02: qty 100

## 2022-05-02 MED ORDER — PHENOL 1.4 % MT LIQD: 1.0000 | OROMUCOSAL | Status: DC | PRN

## 2022-05-02 MED ORDER — CARVEDILOL 12.5 MG PO TABS
12.5000 mg | ORAL_TABLET | Freq: Two times a day (BID) | ORAL | Status: DC
  Administered 2022-05-02 – 2022-05-05 (×6): 12.5 mg via ORAL
  Filled 2022-05-02 (×6): qty 1

## 2022-05-02 MED ORDER — TRANEXAMIC ACID-NACL 1000-0.7 MG/100ML-% IV SOLN
1000.0000 mg | Freq: Once | INTRAVENOUS | Status: AC
  Administered 2022-05-02: 1000 mg via INTRAVENOUS
  Filled 2022-05-02: qty 100

## 2022-05-02 SURGICAL SUPPLY — 42 items
ADH SKN CLS APL DERMABOND .7 (GAUZE/BANDAGES/DRESSINGS) ×1
BAG COUNTER SPONGE SURGICOUNT (BAG) IMPLANT
BAG DECANTER FOR FLEXI CONT (MISCELLANEOUS) IMPLANT
BAG SPEC THK2 15X12 ZIP CLS (MISCELLANEOUS)
BAG SPNG CNTER NS LX DISP (BAG)
BAG ZIPLOCK 12X15 (MISCELLANEOUS) IMPLANT
BLADE SAG 18X100X1.27 (BLADE) ×2 IMPLANT
COVER PERINEAL POST (MISCELLANEOUS) ×2 IMPLANT
COVER SURGICAL LIGHT HANDLE (MISCELLANEOUS) ×2 IMPLANT
CUP ACET PINNACLE SECTR 56MM (Hips) IMPLANT
DERMABOND ADVANCED .7 DNX12 (GAUZE/BANDAGES/DRESSINGS) ×2 IMPLANT
DRAPE FOOT SWITCH (DRAPES) ×2 IMPLANT
DRAPE STERI IOBAN 125X83 (DRAPES) ×2 IMPLANT
DRAPE U-SHAPE 47X51 STRL (DRAPES) ×4 IMPLANT
DRESSING AQUACEL AG SP 3.5X10 (GAUZE/BANDAGES/DRESSINGS) ×2 IMPLANT
DRSG AQUACEL AG ADV 3.5X10 (GAUZE/BANDAGES/DRESSINGS) IMPLANT
DRSG AQUACEL AG SP 3.5X10 (GAUZE/BANDAGES/DRESSINGS) ×1
DURAPREP 26ML APPLICATOR (WOUND CARE) ×2 IMPLANT
ELECT REM PT RETURN 15FT ADLT (MISCELLANEOUS) ×2 IMPLANT
GLOVE BIO SURGEON STRL SZ 6 (GLOVE) ×2 IMPLANT
GLOVE BIOGEL PI IND STRL 6.5 (GLOVE) ×2 IMPLANT
GLOVE BIOGEL PI IND STRL 7.5 (GLOVE) ×2 IMPLANT
GLOVE ORTHO TXT STRL SZ7.5 (GLOVE) ×4 IMPLANT
GOWN STRL REUS W/ TWL LRG LVL3 (GOWN DISPOSABLE) ×6 IMPLANT
GOWN STRL REUS W/TWL LRG LVL3 (GOWN DISPOSABLE) ×3
HEAD CERAMIC DELTA 36 PLUS 1.5 (Hips) IMPLANT
HOLDER FOLEY CATH W/STRAP (MISCELLANEOUS) ×2 IMPLANT
KIT TURNOVER KIT A (KITS) IMPLANT
PACK ANTERIOR HIP CUSTOM (KITS) ×2 IMPLANT
PINNACLE ALTRX PLUS 4 N 36X56 (Hips) IMPLANT
PINNACLE SECTOR CUP 56MM (Hips) ×1 IMPLANT
SCREW 6.5MMX35MM (Screw) IMPLANT
STEM FEM ACTIS HIGH SZ10 (Stem) IMPLANT
SUT MNCRL AB 4-0 PS2 18 (SUTURE) ×2 IMPLANT
SUT STRATAFIX 0 PDS 27 VIOLET (SUTURE) ×1
SUT VIC AB 1 CT1 36 (SUTURE) ×6 IMPLANT
SUT VIC AB 2-0 CT1 27 (SUTURE) ×2
SUT VIC AB 2-0 CT1 TAPERPNT 27 (SUTURE) ×4 IMPLANT
SUTURE STRATFX 0 PDS 27 VIOLET (SUTURE) ×2 IMPLANT
TRAY FOLEY MTR SLVR 16FR STAT (SET/KITS/TRAYS/PACK) IMPLANT
TUBE SUCTION HIGH CAP CLEAR NV (SUCTIONS) ×2 IMPLANT
WATER STERILE IRR 1000ML POUR (IV SOLUTION) ×2 IMPLANT

## 2022-05-02 NOTE — Evaluation (Signed)
Physical Therapy Evaluation Patient Details Name: Terry Wright MRN: [id number] DOB: 05/15/1956 Today's Date: 05/02/2022  History of Present Illness  Pt is a 66yo male presenting s/p R-THAAA on 05/02/22. PMH: angina, anxiety, CAD s/p percuatneous coronary angioplasty & cath & stent, COPD, hx of DVT, GERD, HLD, HTN, PVD, DM, hx of STEMI, tobacco use (quit 2015), L-THA 2021.   Clinical Impression  Terry Wright is a 66 y.o. male POD 0 s/p R-THAAA. Patient reports independence with mobility at baseline. Patient is now limited by functional impairments (see PT problem list below) and requires supervision for bed mobility and min guard for transfers; further mobility deferred secondary to pain and nausea. Patient instructed in exercise to facilitate ROM and circulation to manage edema. Provided incentive spirometer and with Vcs pt able to achieve 1526m. Patient will benefit from continued skilled PT interventions to address impairments and progress towards PLOF. Acute PT will follow to progress mobility and stair training in preparation for safe discharge home.       Recommendations for follow up therapy are one component of a multi-disciplinary discharge planning process, led by the attending physician.  Recommendations may be updated based on patient status, additional functional criteria and insurance authorization.  Follow Up Recommendations Follow physician's recommendations for discharge plan and follow up therapies      Assistance Recommended at Discharge Intermittent Supervision/Assistance  Patient can return home with the following  A little help with walking and/or transfers;A little help with bathing/dressing/bathroom;Assistance with cooking/housework;Assist for transportation;Help with stairs or ramp for entrance    Equipment Recommendations None recommended by PT  Recommendations for Other Services       Functional Status Assessment Patient has had a recent  decline in their functional status and demonstrates the ability to make significant improvements in function in a reasonable and predictable amount of time.     Precautions / Restrictions Precautions Precautions: Fall Restrictions Weight Bearing Restrictions: No      Mobility  Bed Mobility Overal bed mobility: Needs Assistance Bed Mobility: Supine to Sit     Supine to sit: Supervision     General bed mobility comments: For safety only, no physical assist required. Pt utilized bed rails    Transfers Overall transfer level: Needs assistance Equipment used: Rolling walker (2 wheels) Transfers: Sit to/from Stand, Bed to chair/wheelchair/BSC Sit to Stand: Min guard, From elevated surface   Step pivot transfers: Min guard       General transfer comment: Sit to stand: pt required min guard from elevated surface, VCs for hand placement. Upon weightbearing, pt grimacing, reporting 10/10 pain. Step pivot: pt able to take small steps with heavy BUE support on RW, completed stand to sit transfer to recliner with cuing. Further mobility deferred    Ambulation/Gait               General Gait Details: deferred  Stairs            Wheelchair Mobility    Modified Rankin (Stroke Patients Only)       Balance Overall balance assessment: Needs assistance Sitting-balance support: Feet supported, No upper extremity supported Sitting balance-Leahy Scale: Good     Standing balance support: Reliant on assistive device for balance, During functional activity, Bilateral upper extremity supported Standing balance-Leahy Scale: Poor                               Pertinent Vitals/Pain Pain Assessment Pain  Assessment: 0-10 Pain Score: 7  Pain Location: right hip Pain Descriptors / Indicators: Operative site guarding Pain Intervention(s): Limited activity within patient's tolerance, Monitored during session, Ice applied, Repositioned    Home Living  Family/patient expects to be discharged to:: Private residence Living Arrangements: Spouse/significant other Available Help at Discharge: Family;Available 24 hours/day Type of Home: House Home Access: Stairs to enter Entrance Stairs-Rails: None Entrance Stairs-Number of Steps: 1   Home Layout: Able to live on main level with bedroom/bathroom Home Equipment: Shower seat - built Agricultural consultant (2 wheels);Cane - single point      Prior Function Prior Level of Function : Independent/Modified Independent             Mobility Comments: SPC at home ADLs Comments: ind     Hand Dominance        Extremity/Trunk Assessment   Upper Extremity Assessment Upper Extremity Assessment: Overall WFL for tasks assessed    Lower Extremity Assessment Lower Extremity Assessment: RLE deficits/detail;LLE deficits/detail RLE Deficits / Details: MMT ank DF/PF 5/5 RLE Sensation: WNL LLE Deficits / Details: MMT ank DF/PF 5/5 LLE Sensation: WNL    Cervical / Trunk Assessment Cervical / Trunk Assessment: Normal  Communication   Communication: No difficulties  Cognition Arousal/Alertness: Awake/alert Behavior During Therapy: WFL for tasks assessed/performed Overall Cognitive Status: Within Functional Limits for tasks assessed                                          General Comments General comments (skin integrity, edema, etc.): wife [name] present    Exercises Total Joint Exercises Ankle Circles/Pumps: AROM, Both, 20 reps   Assessment/Plan    PT Assessment Patient needs continued PT services  PT Problem List Decreased strength;Decreased range of motion;Decreased activity tolerance;Decreased balance;Decreased mobility;Decreased coordination;Pain       PT Treatment Interventions DME instruction;Gait training;Stair training;Functional mobility training;Therapeutic activities;Therapeutic exercise;Balance training;Neuromuscular re-education;Patient/family  education    PT Goals (Current goals can be found in the Care Plan section)  Acute Rehab PT Goals Patient Stated Goal: Going to the gym PT Goal Formulation: With patient Time For Goal Achievement: 05/09/22 Potential to Achieve Goals: Good    Frequency 7X/week     Co-evaluation               AM-PAC PT "6 Clicks" Mobility  Outcome Measure Help needed turning from your back to your side while in a flat bed without using bedrails?: None Help needed moving from lying on your back to sitting on the side of a flat bed without using bedrails?: A Little Help needed moving to and from a bed to a chair (including a wheelchair)?: A Little Help needed standing up from a chair using your arms (e.g., wheelchair or bedside chair)?: A Little Help needed to walk in hospital room?: A Little Help needed climbing 3-5 steps with a railing? : A Little 6 Click Score: 19    End of Session Equipment Utilized During Treatment: Gait belt Activity Tolerance: Patient limited by pain Patient left: in chair;with call bell/phone within reach;with chair alarm set;with family/visitor present;with SCD's reapplied Nurse Communication: Mobility status PT Visit Diagnosis: Pain;Difficulty in walking, not elsewhere classified (R26.2) Pain - Right/Left: Right Pain - part of body: Hip    Time: 1517-6160 PT Time Calculation (min) (ACUTE ONLY): 31 min   Charges:   PT Evaluation $PT Eval Low Complexity: 1 Low PT  Treatments $Self Care/Home Management: 8-22        Coolidge Breeze, PT, DPT [facility] Office: [phone] Weekend pager: [phone]  Coolidge Breeze 05/02/2022, 7:03 PM

## 2022-05-02 NOTE — Anesthesia Procedure Notes (Signed)
Spinal  Start time: 05/02/2022 11:30 AM End time: 05/02/2022 11:34 AM Reason for block: surgical anesthesia Staffing Resident/CRNA: Victoriano Lain, CRNA Performed by: Victoriano Lain, CRNA Authorized by: Freddrick March, MD   Preanesthetic Checklist Completed: patient identified, IV checked, site marked, risks and benefits discussed, surgical consent, monitors and equipment checked, pre-op evaluation and timeout performed Spinal Block Patient position: sitting Prep: DuraPrep and site prepped and draped Patient monitoring: heart rate, cardiac monitor, continuous pulse ox and blood pressure Approach: midline Location: L3-4 Injection technique: single-shot Needle Needle type: Pencan  Needle gauge: 24 G Needle length: 10 cm Assessment Sensory level: T4 Events: CSF return Additional Notes Spinal kit expiration date checked and verified. Pt placed in sitting position. Sterile prep and drape of back. Local injected to site. One attempt. + clear free flowing CSF obtained. - heme. Pt placed supine after placement

## 2022-05-02 NOTE — Transfer of Care (Signed)
Immediate Anesthesia Transfer of Care Note  Patient: Terry Wright  Procedure(s) Performed: TOTAL HIP ARTHROPLASTY ANTERIOR APPROACH (Right: Hip)  Patient Location: PACU  Anesthesia Type:MAC and Spinal  Level of Consciousness: awake, alert , oriented and patient cooperative  Airway & Oxygen Therapy: Patient Spontanous Breathing and Patient connected to face mask oxygen  Post-op Assessment: Report given to RN and Post -op Vital signs reviewed and stable  Post vital signs: Reviewed and stable  Last Vitals:  Vitals Value Taken Time  BP    Temp    Pulse 69 05/02/22 1308  Resp 11 05/02/22 1308  SpO2 99 % 05/02/22 1308  Vitals shown include unvalidated device data.  Last Pain:  Vitals:   05/02/22 0939  TempSrc:   PainSc: 0-No pain      Patients Stated Pain Goal: 4 (29/52/84 1324)  Complications: No notable events documented.

## 2022-05-02 NOTE — Op Note (Signed)
NAME:  Terry Wright                ACCOUNT NO.: [id number]      MEDICAL RECORD NO.: [id number]      FACILITY:  [facility]      PHYSICIAN:  Mauri Pole  DATE OF BIRTH:  13-Jul-1956     DATE OF PROCEDURE:  05/02/2022                                 OPERATIVE REPORT         PREOPERATIVE DIAGNOSIS: Right  hip osteoarthritis.      POSTOPERATIVE DIAGNOSIS:  Right hip osteoarthritis.      PROCEDURE:  Right total hip replacement through an anterior approach   utilizing DePuy THR system, component size 56 mm pinnacle cup, a size 36+4 neutral   Altrex liner, a size 10 Hi Actis stem with a 36+1.5 delta ceramic   ball.      SURGEON:  Pietro Cassis. Alvan Dame, M.D.      ASSISTANT:  Costella Hatcher, PA-C     ANESTHESIA:  Spinal.      SPECIMENS:  None.      COMPLICATIONS:  None.      BLOOD LOSS:  300 cc     DRAINS:  None.      INDICATION OF THE PROCEDURE:  Terry Wright is a 66 y.o. male who had   presented to office for evaluation of right hip pain.  Radiographs revealed   progressive degenerative changes with bone-on-bone   articulation of the  hip joint, including subchondral cystic changes and osteophytes.  The patient had painful limited range of   motion significantly affecting their overall quality of life and function.  The patient was failing to    respond to conservative measures including medications and/or injections and activity modification and at this point was ready   to proceed with more definitive measures.  Consent was obtained for   benefit of pain relief.  Specific risks of infection, DVT, component   failure, dislocation, neurovascular injury, and need for revision surgery were reviewed in the office.     PROCEDURE IN DETAIL:  The patient was brought to operative theater.   Once adequate anesthesia, preoperative antibiotics, 2 gm of Ancef, 1 gm of Tranexamic Acid, and 10 mg of Decadron were administered, the patient was positioned  supine on the [employer] table.  Once the patient was safely positioned with adequate padding of boney prominences we predraped out the hip, and used fluoroscopy to confirm orientation of the pelvis.      The right hip was then prepped and draped from proximal iliac crest to   mid thigh with a shower curtain technique.      Time-out was performed identifying the patient, planned procedure, and the appropriate extremity.     An incision was then made 2 cm lateral to the   anterior superior iliac spine extending over the orientation of the   tensor fascia lata muscle and sharp dissection was carried down to the   fascia of the muscle.      The fascia was then incised.  The muscle belly was identified and swept   laterally and retractor placed along the superior neck.  Following   cauterization of the circumflex vessels and removing some pericapsular   fat, a second cobra retractor was placed on the inferior  neck.  A T-capsulotomy was made along the line of the   superior neck to the trochanteric fossa, then extended proximally and   distally.  Tag sutures were placed and the retractors were then placed   intracapsular.  We then identified the trochanteric fossa and   orientation of my neck cut and then made a neck osteotomy with the femur on traction.  The femoral   head was removed without difficulty or complication.  Traction was let   off and retractors were placed posterior and anterior around the   acetabulum.      The labrum and foveal tissue were debrided.  I began reaming with a 50 mm   reamer and reamed up to 55 mm reamer with good bony bed preparation and a 56 mm  cup was chosen.  The final 56 mm Pinnacle cup was then impacted under fluoroscopy to confirm the depth of penetration and orientation with respect to   Abduction and forward flexion.  A screw was placed into the ilium followed by the hole eliminator.  The final   36+4 neutral Altrex liner was impacted with good visualized  rim fit.  The cup was positioned anatomically within the acetabular portion of the pelvis.      At this point, the femur was rolled to 100 degrees.  Further capsule was   released off the inferior aspect of the femoral neck.  I then   released the superior capsule proximally.  With the leg in a neutral position the hook was placed laterally   along the femur under the vastus lateralis origin and elevated manually and then held in position using the hook attachment on the bed.  The leg was then extended and adducted with the leg rolled to 100   degrees of external rotation.  Retractors were placed along the medial calcar and posteriorly over the greater trochanter.  Once the proximal femur was fully   exposed, I used a box osteotome to set orientation.  I then began   broaching with the starting chili pepper broach and passed this by hand and then broached up to 10.  With the 10 broach in place I chose a high offset neck and did several trial reductions.  The offset was appropriate, leg lengths   appeared to be equal best matched with the +1.5 head ball trial confirmed radiographically.   Given these findings, I went ahead and dislocated the hip, repositioned all   retractors and positioned the right hip in the extended and abducted position.  The final 10 Hi Actis stem was   chosen and it was impacted down to the level of neck cut.  Based on this   and the trial reductions, a final 36+1.5 delta ceramic ball was chosen and   impacted onto a clean and dry trunnion, and the hip was reduced.  The   hip had been irrigated throughout the case again at this point.  I did   reapproximate the superior capsular leaflet to the anterior leaflet   using #1 Vicryl.  The fascia of the   tensor fascia lata muscle was then reapproximated using #1 Vicryl and #0 Stratafix sutures.  The   remaining wound was closed with 2-0 Vicryl and running 4-0 Monocryl.   The hip was cleaned, dried, and dressed sterilely using  Dermabond and   Aquacel dressing.  The patient was then brought   to recovery room in stable condition tolerating the procedure well.    Caryl Pina  Lu Duffel, PA-C was present for the entirety of the case involved from   preoperative positioning, perioperative retractor management, general   facilitation of the case, as well as primary wound closure as assistant.            Pietro Cassis Alvan Dame, M.D.        05/02/2022 10:16 AM

## 2022-05-02 NOTE — Anesthesia Postprocedure Evaluation (Signed)
Anesthesia Post Note  Patient: Terry Wright  Procedure(s) Performed: TOTAL HIP ARTHROPLASTY ANTERIOR APPROACH (Right: Hip)     Patient location during evaluation: PACU Anesthesia Type: Spinal Level of consciousness: oriented and awake and alert Pain management: pain level controlled Vital Signs Assessment: post-procedure vital signs reviewed and stable Respiratory status: spontaneous breathing, respiratory function stable and patient connected to nasal cannula oxygen Cardiovascular status: blood pressure returned to baseline and stable Postop Assessment: no headache, no backache and no apparent nausea or vomiting Anesthetic complications: no   No notable events documented.  Last Vitals:  Vitals:   05/02/22 1515 05/02/22 1530  BP: 119/82 135/77  Pulse: 67 65  Resp: 13 16  Temp:  36.6 C  SpO2: 99% 98%    Last Pain:  Vitals:   05/02/22 1547  TempSrc:   PainSc: 7                  Alvey Brockel L Anella Nakata

## 2022-05-02 NOTE — Care Plan (Signed)
Ortho Bundle Case Management Note  Patient Details  Name: Terry Wright MRN: [id number] Date of Birth: June 09, 1956  R THA on 05-02-22 DCP:  Home with wife DME:  No needs, has a RW PT:  HEP                   DME Arranged:  N/A DME Agency:  NA  HH Arranged:  NA HH Agency:  NA  Additional Comments: Please contact me with any questions of if this plan should need to change.  Marianne Sofia, RN,CCM [facility]  [phone] 05/02/2022, 1:12 PM

## 2022-05-02 NOTE — Anesthesia Procedure Notes (Signed)
Procedure Name: MAC Date/Time: 05/02/2022 11:25 AM  Performed by: Victoriano Lain, CRNAPre-anesthesia Checklist: Patient identified, Emergency Drugs available, Suction available, Patient being monitored and Timeout performed Patient Re-evaluated:Patient Re-evaluated prior to induction Oxygen Delivery Method: Simple face mask Dental Injury: Teeth and Oropharynx as per pre-operative assessment

## 2022-05-02 NOTE — Interval H&P Note (Signed)
History and Physical Interval Note:  05/02/2022 10:15 AM  Terry Wright  has presented today for surgery, with the diagnosis of Right hip osteoarthritis.  The various methods of treatment have been discussed with the patient and family. After consideration of risks, benefits and other options for treatment, the patient has consented to  Procedure(s) with comments: [place] (Right) - 90 as a surgical intervention.  The patient's history has been reviewed, patient examined, no change in status, stable for surgery.  I have reviewed the patient's chart and labs.  Questions were answered to the patient's satisfaction.     Mauri Pole

## 2022-05-02 NOTE — Discharge Instructions (Addendum)

## 2022-05-03 ENCOUNTER — Encounter (HOSPITAL_COMMUNITY): Payer: Self-pay | Admitting: Orthopedic Surgery

## 2022-05-03 ENCOUNTER — Telehealth: Payer: Self-pay | Admitting: Cardiology

## 2022-05-03 DIAGNOSIS — M1611 Unilateral primary osteoarthritis, right hip: Secondary | ICD-10-CM | POA: Diagnosis not present

## 2022-05-03 LAB — CBC
HCT: 37.8 % — ABNORMAL LOW (ref 39.0–52.0)
Hemoglobin: 12.4 g/dL — ABNORMAL LOW (ref 13.0–17.0)
MCH: 30 pg (ref 26.0–34.0)
MCHC: 32.8 g/dL (ref 30.0–36.0)
MCV: 91.3 fL (ref 80.0–100.0)
Platelets: 156 10*3/uL (ref 150–400)
RBC: 4.14 MIL/uL — ABNORMAL LOW (ref 4.22–5.81)
RDW: 13.4 % (ref 11.5–15.5)
WBC: 12.1 10*3/uL — ABNORMAL HIGH (ref 4.0–10.5)
nRBC: 0 % (ref 0.0–0.2)

## 2022-05-03 LAB — BASIC METABOLIC PANEL
Anion gap: 6 (ref 5–15)
BUN: 17 mg/dL (ref 8–23)
CO2: 24 mmol/L (ref 22–32)
Calcium: 8.3 mg/dL — ABNORMAL LOW (ref 8.9–10.3)
Chloride: 108 mmol/L (ref 98–111)
Creatinine, Ser: 0.81 mg/dL (ref 0.61–1.24)
GFR, Estimated: >60 mL/min (ref >60)
Glucose, Bld: 153 mg/dL — ABNORMAL HIGH (ref 70–99)
Potassium: 3.4 mmol/L — ABNORMAL LOW (ref 3.5–5.1)
Sodium: 138 mmol/L (ref 135–145)

## 2022-05-03 MED ORDER — DOCUSATE SODIUM 100 MG PO CAPS: 100.0000 mg | ORAL_CAPSULE | Freq: Two times a day (BID) | ORAL | 0 refills | Status: DC

## 2022-05-03 MED ORDER — LORAZEPAM 0.5 MG PO TABS
0.5000 mg | ORAL_TABLET | Freq: Every evening | ORAL | Status: DC | PRN
  Administered 2022-05-03 – 2022-05-04 (×2): 0.5 mg via ORAL
  Filled 2022-05-03 (×2): qty 1

## 2022-05-03 MED ORDER — ACETAMINOPHEN 500 MG PO TABS: 1000.0000 mg | ORAL_TABLET | Freq: Four times a day (QID) | ORAL | 0 refills | Status: DC

## 2022-05-03 MED ORDER — POLYETHYLENE GLYCOL 3350 17 G PO PACK: 17.0000 g | PACK | Freq: Two times a day (BID) | ORAL | 0 refills | Status: DC

## 2022-05-03 MED ORDER — RIVAROXABAN 10 MG PO TABS: 10.0000 mg | ORAL_TABLET | Freq: Every day | ORAL | 0 refills | Status: DC

## 2022-05-03 MED ORDER — ONDANSETRON HCL 4 MG PO TABS: 4.0000 mg | ORAL_TABLET | Freq: Four times a day (QID) | ORAL | 0 refills | Status: DC | PRN

## 2022-05-03 MED ORDER — POTASSIUM CHLORIDE CRYS ER 20 MEQ PO TBCR
20.0000 meq | EXTENDED_RELEASE_TABLET | Freq: Once | ORAL | Status: AC
  Administered 2022-05-03: 20 meq via ORAL
  Filled 2022-05-03: qty 1

## 2022-05-03 MED ORDER — HYDROMORPHONE HCL 2 MG PO TABS: 2.0000 mg | ORAL_TABLET | ORAL | 0 refills | Status: DC | PRN

## 2022-05-03 MED ORDER — METHOCARBAMOL 500 MG PO TABS: 500.0000 mg | ORAL_TABLET | Freq: Four times a day (QID) | ORAL | 1 refills | Status: DC | PRN

## 2022-05-03 NOTE — Progress Notes (Signed)
Patient ID: Terry Wright, male   DOB: [birth year], 66 y.o.   MRN: [id number] Subjective: 1 Day Post-Op Procedure(s) (LRB): TOTAL HIP ARTHROPLASTY ANTERIOR APPROACH (Right)    Patient reports pain as moderate. Had some issues with immediate post op nausea.  This seems to have improved with time and use of zofran and phenergan  Objective:   VITALS:   Vitals:   05/03/22 0114 05/03/22 0614  BP: 116/68 (!) 120/55  Pulse: 80 65  Resp: 17 17  Temp: 98.3 F (36.8 C) 98.1 F (36.7 C)  SpO2: 94% 97%    Neurovascular intact Incision: dressing C/D/I  LABS Recent Labs    05/03/22 0338  HGB 12.4*  HCT 37.8*  WBC 12.1*  PLT 156    Recent Labs    05/03/22 0338  NA 138  K 3.4*  BUN 17  CREATININE 0.81  GLUCOSE 153*    No results for input(s): "LABPT", "INR" in the last 72 hours.   Assessment/Plan: 1 Day Post-Op Procedure(s) (LRB): TOTAL HIP ARTHROPLASTY ANTERIOR APPROACH (Right)   Advance diet Up with therapy Likely home today after therapy RTC in 2 weeks

## 2022-05-03 NOTE — Telephone Encounter (Signed)
Pt c/o medication issue:  1. Name of Medication: rivaroxaban (XARELTO) tablet 10 mg clopidogrel (PLAVIX) 75 MG  2. How are you currently taking this medication (dosage and times per day)?   3. Are you having a reaction (difficulty breathing--STAT)?   4. What is your medication issue?  Pt was told by pharmacy he was suppose to be taking this differently and needs call back for clarification.

## 2022-05-03 NOTE — Progress Notes (Signed)
Physical Therapy Treatment Patient Details Name: Terry Wright MRN: [id number] DOB: 09/28/1955 Today's Date: 05/03/2022   History of Present Illness Pt is a 66yo male presenting s/p R-THAAA on 05/02/22. PMH: angina, anxiety, CAD s/p percuatneous coronary angioplasty & cath & stent, COPD, hx of DVT, GERD, HLD, HTN, PVD, DM, hx of STEMI, tobacco use (quit 2015), L-THA 2021.    PT Comments    Pt seen POD1 received supine in bed, reporting poor sleep last night but minimal pain. Required min assist for bed mobility, min guard for transfers, and min guard with recliner follow for safety with ambulation in hallway with RW 44f; distance limited by pain. Pt is progressing from previous session, will require HEP, further ambulation, and stair training prior to meeting mobility goals. We will continue to follow acutely.    Recommendations for follow up therapy are one component of a multi-disciplinary discharge planning process, led by the attending physician.  Recommendations may be updated based on patient status, additional functional criteria and insurance authorization.  Follow Up Recommendations  Follow physician's recommendations for discharge plan and follow up therapies     Assistance Recommended at Discharge Intermittent Supervision/Assistance  Patient can return home with the following A little help with walking and/or transfers;A little help with bathing/dressing/bathroom;Assistance with cooking/housework;Assist for transportation;Help with stairs or ramp for entrance   Equipment Recommendations  None recommended by PT    Recommendations for Other Services       Precautions / Restrictions Precautions Precautions: Fall Restrictions Weight Bearing Restrictions: No     Mobility  Bed Mobility Overal bed mobility: Needs Assistance Bed Mobility: Supine to Sit     Supine to sit: Min assist     General bed mobility comments: Min assist for trunk elevation     Transfers Overall transfer level: Needs assistance Equipment used: Rolling walker (2 wheels) Transfers: Sit to/from Stand, Bed to chair/wheelchair/BSC Sit to Stand: Min guard, From elevated surface           General transfer comment: Sit to stand: pt required min guard from elevated surface, VCs for hand placement.    Ambulation/Gait Ambulation/Gait assistance: Min guard Gait Distance (Feet): 14 Feet Assistive device: Rolling walker (2 wheels) Gait Pattern/deviations: Step-to pattern, Knee flexed in stance - right Gait velocity: decreased     General Gait Details: Pt ambulated with RW and min guard, no physical assist or overt LOB required, recliner follow for safety. Pt with antalgic gait pattern, R knee flexed in stance, gingerly able to place whole R foot on floor. Pt complaining of 10/10 pain in R IT band distal insertion, mild swelling compared R>L, further mobility deferred.   Stairs             Wheelchair Mobility    Modified Rankin (Stroke Patients Only)       Balance Overall balance assessment: Needs assistance Sitting-balance support: Feet supported, No upper extremity supported Sitting balance-Leahy Scale: Good     Standing balance support: Reliant on assistive device for balance, During functional activity, Bilateral upper extremity supported Standing balance-Leahy Scale: Poor                              Cognition Arousal/Alertness: Awake/alert Behavior During Therapy: WFL for tasks assessed/performed Overall Cognitive Status: Within Functional Limits for tasks assessed  Exercises Total Joint Exercises Ankle Circles/Pumps: AROM, Both, 20 reps    General Comments        Pertinent Vitals/Pain Pain Assessment Pain Assessment: 0-10 Pain Score: 10-Worst pain ever Pain Location: right IT band, lateral knee Pain Descriptors / Indicators: Tender, Sore Pain  Intervention(s): Limited activity within patient's tolerance, Monitored during session, Repositioned, Ice applied, Patient requesting pain meds-RN notified    Home Living                          Prior Function            PT Goals (current goals can now be found in the care plan section) Acute Rehab PT Goals Patient Stated Goal: Going to the gym PT Goal Formulation: With patient Time For Goal Achievement: 05/09/22 Potential to Achieve Goals: Good Progress towards PT goals: Progressing toward goals    Frequency    7X/week      PT Plan Current plan remains appropriate    Co-evaluation              AM-PAC PT "6 Clicks" Mobility   Outcome Measure  Help needed turning from your back to your side while in a flat bed without using bedrails?: None Help needed moving from lying on your back to sitting on the side of a flat bed without using bedrails?: A Little Help needed moving to and from a bed to a chair (including a wheelchair)?: A Little Help needed standing up from a chair using your arms (e.g., wheelchair or bedside chair)?: A Little Help needed to walk in hospital room?: A Little Help needed climbing 3-5 steps with a railing? : A Little 6 Click Score: 19    End of Session Equipment Utilized During Treatment: Gait belt Activity Tolerance: Patient limited by pain Patient left: in chair;with call bell/phone within reach;with chair alarm set;with SCD's reapplied Nurse Communication: Mobility status PT Visit Diagnosis: Pain;Difficulty in walking, not elsewhere classified (R26.2) Pain - Right/Left: Right Pain - part of body: Hip     Time: 7121-9758 PT Time Calculation (min) (ACUTE ONLY): 23 min  Charges:  $Gait Training: 8-22 mins                     Coolidge Breeze, PT, DPT [facility] Office: [phone] Weekend pager: [phone]   Coolidge Breeze 05/03/2022, 11:37 AM

## 2022-05-03 NOTE — Progress Notes (Signed)
Physical Therapy Treatment Patient Details Name: Terry Wright MRN: [id number] DOB: [birth year] Today's Date: 05/03/2022   History of Present Illness Pt is a 66yo male presenting s/p R-THAAA on 05/02/22. PMH: angina, anxiety, CAD s/p percuatneous coronary angioplasty & cath & stent, COPD, hx of DVT, GERD, HLD, HTN, PVD, DM, hx of STEMI, tobacco use (quit 2015), L-THA 2021.    PT Comments    Pt seen POD1 received supine in bed motivated to mobilize. Demonstrated supervision for bed mobility and for transfers, and min guard for ambulation in hallway with RW 62f, distance limited by pain. Pt continues to report high degree (8-10/10) pain in lateral thigh in area of IT band and distal insertion; compared side to side and mildly increased edema in R knee with IT band with moderately increased tension of IT band; pt describes pain as burning and soreness. This pain is felt most acutely during ambulation during the swing phase / hip flexion and limits the pt's distance; pt refused to complete stair training secondary to pain. Pt will need to ambulate at least 453fto be able to cover the distance from car to living room and step up a threshold-style step into the house; these will need to be simulated and pt complete safely during acute stay to have met mobility goals. We will continue to follow acutely to return pt home in care of his wife.    Recommendations for follow up therapy are one component of a multi-disciplinary discharge planning process, led by the attending physician.  Recommendations may be updated based on patient status, additional functional criteria and insurance authorization.  Follow Up Recommendations  Follow physician's recommendations for discharge plan and follow up therapies     Assistance Recommended at Discharge Intermittent Supervision/Assistance  Patient can return home with the following A little help with walking and/or transfers;A little help with  bathing/dressing/bathroom;Assistance with cooking/housework;Assist for transportation;Help with stairs or ramp for entrance   Equipment Recommendations  None recommended by PT    Recommendations for Other Services       Precautions / Restrictions Precautions Precautions: Fall Restrictions Weight Bearing Restrictions: No     Mobility  Bed Mobility Overal bed mobility: Needs Assistance Bed Mobility: Sit to Supine     Supine to sit: Supervision Sit to supine: Min assist   General bed mobility comments: Supervision for safety, no physical assist required    Transfers Overall transfer level: Needs assistance Equipment used: Rolling walker (2 wheels) Transfers: Sit to/from Stand, Bed to chair/wheelchair/BSC Sit to Stand: Supervision   Step pivot transfers: Min guard       General transfer comment: Supervision for sit to stand transfer for safety    Ambulation/Gait Ambulation/Gait assistance: Min guard Gait Distance (Feet): 36 Feet Assistive device: Rolling walker (2 wheels) Gait Pattern/deviations: Step-to pattern, Knee flexed in stance - right Gait velocity: decreased     General Gait Details: Pt ambulated with RW and min guard, no physical assist required or overt LOB noted. Pt with antalgic gait pattern, R knee flexed in stance, gingerly able to place whole R foot on floor. Pt complaining of 10/10 pain in R IT band distal insertion, mild swelling compared R>L, further mobility deferred.   Stairs             Wheelchair Mobility    Modified Rankin (Stroke Patients Only)       Balance Overall balance assessment: Needs assistance Sitting-balance support: Feet supported, No upper extremity supported Sitting balance-Leahy Scale: Good  Standing balance support: Reliant on assistive device for balance, During functional activity, Bilateral upper extremity supported Standing balance-Leahy Scale: Poor                               Cognition Arousal/Alertness: Awake/alert Behavior During Therapy: WFL for tasks assessed/performed Overall Cognitive Status: Within Functional Limits for tasks assessed                                          Exercises o    General Comments General comments (skin integrity, edema, etc.): Wife [name] present      Pertinent Vitals/Pain Pain Assessment Pain Assessment: 0-10 Pain Score: 10-Worst pain ever Faces Pain Scale: Hurts whole lot Pain Location: right IT band, lateral knee Pain Descriptors / Indicators: Tender, Sore, Throbbing, Burning Pain Intervention(s): Limited activity within patient's tolerance, Monitored during session, Repositioned, Ice applied    Home Living                          Prior Function            PT Goals (current goals can now be found in the care plan section) Acute Rehab PT Goals Patient Stated Goal: Going to the gym PT Goal Formulation: With patient Time For Goal Achievement: 05/09/22 Potential to Achieve Goals: Good Progress towards PT goals: Progressing toward goals (Slow, incremental progress)    Frequency    7X/week      PT Plan Current plan remains appropriate    Co-evaluation              AM-PAC PT "6 Clicks" Mobility   Outcome Measure  Help needed turning from your back to your side while in a flat bed without using bedrails?: None Help needed moving from lying on your back to sitting on the side of a flat bed without using bedrails?: A Little Help needed moving to and from a bed to a chair (including a wheelchair)?: A Little Help needed standing up from a chair using your arms (e.g., wheelchair or bedside chair)?: A Little Help needed to walk in hospital room?: A Little Help needed climbing 3-5 steps with a railing? : A Little 6 Click Score: 19    End of Session Equipment Utilized During Treatment: Gait belt Activity Tolerance: Patient limited by pain Patient left: with call  bell/phone within reach;with SCD's reapplied;in bed;with bed alarm set;with family/visitor present Nurse Communication: Mobility status PT Visit Diagnosis: Pain;Difficulty in walking, not elsewhere classified (R26.2) Pain - Right/Left: Right Pain - part of body: Hip     Time: 7408-1448 PT Time Calculation (min) (ACUTE ONLY): 29 min  Charges:  $Gait Training: 23-37 mins                      Coolidge Breeze, PT, DPT [facility] Office: [phone] Weekend pager: [phone]   Coolidge Breeze 05/03/2022, 3:25 PM

## 2022-05-03 NOTE — Progress Notes (Signed)
Physical Therapy Treatment Patient Details Name: Terry Wright MRN: [id number] DOB: 07/27/1956 Today's Date: 05/03/2022  PT Comments    Pt seen POD1 for second session, received in recliner reporting reduced pain. Required min guard for transfers; min assist for bed mobility.  Provided HEP and pt completed supine portion with use of gait belt to self-assist, minimal cuing for form and breathing. Provided demonstration of exercise progression and appropriate start dates based on pt's projected performance and abilities with emphasis on walking program, wife and pt verbalized understanding. Pt is progressing from previous session and will require further ambulation and stair training prior to meeting mobility goals. We will continue to follow acutely.       05/03/22 1150  PT Visit Information  Last PT Received On 05/03/22  Assistance Needed +1  History of Present Illness Pt is a 66yo male presenting s/p R-THAAA on 05/02/22. PMH: angina, anxiety, CAD s/p percuatneous coronary angioplasty & cath & stent, COPD, hx of DVT, GERD, HLD, HTN, PVD, DM, hx of STEMI, tobacco use (quit 2015), L-THA 2021.  Subjective Data  Subjective I'm feeling better  Patient Stated Goal Going to the gym  Precautions  Precautions Fall  Restrictions  Weight Bearing Restrictions No  Pain Assessment  Pain Assessment Faces  Faces Pain Scale 8  Pain Location right IT band, lateral knee  Pain Descriptors / Indicators Tender;Sore  Pain Intervention(s) Limited activity within patient's tolerance;Monitored during session;Repositioned;Ice applied  Cognition  Arousal/Alertness Awake/alert  Behavior During Therapy WFL for tasks assessed/performed  Overall Cognitive Status Within Functional Limits for tasks assessed  Bed Mobility  Overal bed mobility Needs Assistance  Bed Mobility Sit to Supine  Sit to supine Min assist  General bed mobility comments Min assist to bring RLE into bed, pt attempted using gait belt  to self-assist but struggled. Pt able to adjust supine positioning with LLE and BUE bridging.  Transfers  Overall transfer level Needs assistance  Equipment used Rolling walker (2 wheels)  Transfers Sit to/from Stand;Bed to chair/wheelchair/BSC  Sit to Stand Supervision  Bed to/from chair/wheelchair/BSC transfer type: Step pivot  Step pivot transfers Min guard  General transfer comment Supervision for sit to stand transfer for safety; min guard for step pivot, no physical assist required, VCs for sequencing and sidestepping at EOB to promote best supine positioning.  Balance  Overall balance assessment Needs assistance  Sitting-balance support Feet supported;No upper extremity supported  Sitting balance-Leahy Scale Good  Standing balance support Reliant on assistive device for balance;During functional activity;Bilateral upper extremity supported  Standing balance-Leahy Scale Poor  General Comments  General comments (skin integrity, edema, etc.) Wife lynn present  Exercises  Exercises Total Joint  Total Joint Exercises  Ankle Circles/Pumps AROM;Both;20 reps  Quad Sets AROM;Right;10 reps;Supine  Short Arc Quad AAROM;Right;10 reps  Long [employer] Other (comment) (educated, not performed)  Heel Slides AAROM;Right;10 reps;Supine  Hip ABduction/ADduction AAROM;Right;10 reps;Other (comment) (standing: educated, not performed)  Marching in Standing Other (comment) (educated not performed)  Standing Hip Extension Other (comment) (educated not performed)  PT - End of Session  Equipment Utilized During Treatment Gait belt  Activity Tolerance No increased pain;Patient tolerated treatment well  Patient left with call bell/phone within reach;with SCD's reapplied;in bed;with bed alarm set;with family/visitor present  Nurse Communication Mobility status   PT - Assessment/Plan  PT Plan Current plan remains appropriate  PT Visit Diagnosis Pain;Difficulty in walking, not elsewhere classified  (R26.2)  Pain - Right/Left Right  Pain - part of body Hip  PT Frequency (ACUTE ONLY) 7X/week  Follow Up Recommendations Follow physician's recommendations for discharge plan and follow up therapies  Assistance recommended at discharge Intermittent Supervision/Assistance  Patient can return home with the following A little help with walking and/or transfers;A little help with bathing/dressing/bathroom;Assistance with cooking/housework;Assist for transportation;Help with stairs or ramp for entrance  PT equipment None recommended by PT  AM-PAC PT "6 Clicks" Mobility Outcome Measure (Version 2)  Help needed turning from your back to your side while in a flat bed without using bedrails? 4  Help needed moving from lying on your back to sitting on the side of a flat bed without using bedrails? 3  Help needed moving to and from a bed to a chair (including a wheelchair)? 3  Help needed standing up from a chair using your arms (e.g., wheelchair or bedside chair)? 3  Help needed to walk in hospital room? 3  Help needed climbing 3-5 steps with a railing?  3  6 Click Score 19  Consider Recommendation of Discharge To: Home with [facility]  Progressive Mobility  What is the highest level of mobility based on the progressive mobility assessment? Level 5 (Walks with assist in room/hall) - Balance while stepping forward/back and can walk in room with assist - Complete  Activity Transferred from chair to bed;Stood at bedside (HEP in recliner)  PT Goal Progression  Progress towards PT goals Progressing toward goals  Acute Rehab PT Goals  PT Goal Formulation With patient  Time For Goal Achievement 05/09/22  Potential to Achieve Goals Good  PT Time Calculation  PT Start Time (ACUTE ONLY) 1150  PT Stop Time (ACUTE ONLY) 1220  PT Time Calculation (min) (ACUTE ONLY) 30 min  PT General Charges  $$ ACUTE PT VISIT 1 Visit  PT Treatments  $Therapeutic Exercise 23-37 mins   Coolidge Breeze, PT, DPT [facility]  Rehabilitation Department Office: [phone] Weekend pager: [phone]

## 2022-05-03 NOTE — Telephone Encounter (Signed)
Patient asked if he should be on Xarelto and ASA and not take Plavix (admitted for hip arthroscopy). I spoke with Dr. Ellyn Hack. He wants patient to take Xarelto '10mg'$  daily and ASA '81mg'$  daily. When patient stops taking ASA, he is to go back on Plavix '75mg'$  daily. I called patient back and explained the medications. He verbalized understanding and stated, "I agree with that."

## 2022-05-03 NOTE — Progress Notes (Signed)
The patient is requesting Ativan 0.5 mg. Paged Emerge.

## 2022-05-03 NOTE — TOC Transition Note (Signed)
Transition of Care [facility]) - CM/SW Discharge Note   Patient Details  Name: Terry Wright MRN: [id number] Date of Birth: July 08, 1956  Transition of Care [facility]) CM/SW Contact:  Lennart Pall, LCSW Phone Number: 05/03/2022, 9:56 AM   Clinical Narrative:     Met with pt and confirming he has all needed DME at home. Plan for HEP.  No TOC needs.  Final next level of care: Home/Self Care Barriers to Discharge: No Barriers Identified   Patient Goals and CMS Choice Patient states their goals for this hospitalization and ongoing recovery are:: return home      Discharge Placement                       Discharge Plan and Services                DME Arranged: N/A DME Agency: NA       HH Arranged: NA HH Agency: NA        Social Determinants of Health (SDOH) Interventions     Readmission Risk Interventions     No data to display

## 2022-05-04 DIAGNOSIS — M1611 Unilateral primary osteoarthritis, right hip: Secondary | ICD-10-CM | POA: Diagnosis not present

## 2022-05-04 LAB — CBC
HCT: 38.3 % — ABNORMAL LOW (ref 39.0–52.0)
Hemoglobin: 12.8 g/dL — ABNORMAL LOW (ref 13.0–17.0)
MCH: 30.3 pg (ref 26.0–34.0)
MCHC: 33.4 g/dL (ref 30.0–36.0)
MCV: 90.5 fL (ref 80.0–100.0)
Platelets: 160 10*3/uL (ref 150–400)
RBC: 4.23 MIL/uL (ref 4.22–5.81)
RDW: 13.9 % (ref 11.5–15.5)
WBC: 14.5 10*3/uL — ABNORMAL HIGH (ref 4.0–10.5)
nRBC: 0 % (ref 0.0–0.2)

## 2022-05-04 MED ORDER — POTASSIUM CHLORIDE CRYS ER 20 MEQ PO TBCR
40.0000 meq | EXTENDED_RELEASE_TABLET | Freq: Once | ORAL | Status: AC
  Administered 2022-05-04: 40 meq via ORAL
  Filled 2022-05-04: qty 2

## 2022-05-04 NOTE — Progress Notes (Signed)
Physical Therapy Treatment Patient Details Name: Terry Wright MRN: [id number] DOB: 04/19/1956 Today's Date: 05/04/2022   History of Present Illness Pt is a 66yo male presenting s/p R-THAAA on 05/02/22. PMH: angina, anxiety, CAD s/p percuatneous coronary angioplasty & cath & stent, COPD, hx of DVT, GERD, HLD, HTN, PVD, DM, hx of STEMI, tobacco use (quit 2015), L-THA 2021.    PT Comments    Pt ambulated 25' with RW, distance limited by severe, burning pain R lateral knee. He did not feel he could attempt stairs due to pain. Heat applied to R distal ITB site. R knee pain significantly limiting activity tolerance.    Recommendations for follow up therapy are one component of a multi-disciplinary discharge planning process, led by the attending physician.  Recommendations may be updated based on patient status, additional functional criteria and insurance authorization.  Follow Up Recommendations  Follow physician's recommendations for discharge plan and follow up therapies     Assistance Recommended at Discharge Intermittent Supervision/Assistance  Patient can return home with the following A little help with walking and/or transfers;A little help with bathing/dressing/bathroom;Assistance with cooking/housework;Assist for transportation;Help with stairs or ramp for entrance   Equipment Recommendations  None recommended by PT    Recommendations for Other Services       Precautions / Restrictions Precautions Precautions: Fall Restrictions Weight Bearing Restrictions: No     Mobility  Bed Mobility Overal bed mobility: Needs Assistance, Modified Independent Bed Mobility: Sit to Supine     Supine to sit: Modified independent (Device/Increase time), HOB elevated Sit to supine: Min assist   General bed mobility comments: min A for LE into bed    Transfers Overall transfer level: Needs assistance Equipment used: Rolling walker (2 wheels) Transfers: Sit to/from Stand Sit  to Stand: Supervision           General transfer comment: VCs hand placement    Ambulation/Gait Ambulation/Gait assistance: Supervision Gait Distance (Feet): 25 Feet Assistive device: Rolling walker (2 wheels) Gait Pattern/deviations: Step-to pattern, Knee flexed in stance - right Gait velocity: decreased     General Gait Details: Pt attempted to ambulate in hallway, distance limited by severe, burning pain in R lateral knee. Pt unable to attempt stair training.   Stairs             Wheelchair Mobility    Modified Rankin (Stroke Patients Only)       Balance Overall balance assessment: Needs assistance Sitting-balance support: Feet supported, No upper extremity supported Sitting balance-Leahy Scale: Good     Standing balance support: Reliant on assistive device for balance, During functional activity, Bilateral upper extremity supported Standing balance-Leahy Scale: Poor                              Cognition Arousal/Alertness: Awake/alert Behavior During Therapy: WFL for tasks assessed/performed Overall Cognitive Status: Within Functional Limits for tasks assessed                                          Exercises Total Joint Exercises Ankle Circles/Pumps: AROM, Both, 20 reps Heel Slides: AAROM, Right, 5 reps, Supine    General Comments        Pertinent Vitals/Pain Pain Assessment Pain Score: 9  Pain Location: right IT band, lateral knee Pain Descriptors / Indicators: Grimacing, Guarding, Burning Pain Intervention(s): Limited activity  within patient's tolerance, Monitored during session, Premedicated before session, Repositioned, Heat applied    Home Living                          Prior Function            PT Goals (current goals can now be found in the care plan section) Acute Rehab PT Goals Patient Stated Goal: Going to the gym PT Goal Formulation: With patient Time For Goal Achievement:  05/09/22 Potential to Achieve Goals: Good Progress towards PT goals: Not progressing toward goals - comment (pain limiting progress)    Frequency    7X/week      PT Plan Current plan remains appropriate    Co-evaluation              AM-PAC PT "6 Clicks" Mobility   Outcome Measure  Help needed turning from your back to your side while in a flat bed without using bedrails?: None Help needed moving from lying on your back to sitting on the side of a flat bed without using bedrails?: A Little Help needed moving to and from a bed to a chair (including a wheelchair)?: A Little Help needed standing up from a chair using your arms (e.g., wheelchair or bedside chair)?: A Little Help needed to walk in hospital room?: A Little Help needed climbing 3-5 steps with a railing? : A Little 6 Click Score: 19    End of Session Equipment Utilized During Treatment: Gait belt Activity Tolerance: Patient limited by pain Patient left: with call bell/phone within reach;in bed;with bed alarm set;with family/visitor present Nurse Communication: Mobility status PT Visit Diagnosis: Pain;Difficulty in walking, not elsewhere classified (R26.2) Pain - Right/Left: Right Pain - part of body: Hip     Time: 7741-2878 PT Time Calculation (min) (ACUTE ONLY): 23 min  Charges:  $Gait Training: 8-22 mins  $Therapeutic Activity: 8-22 mins                     Blondell Reveal Kistler PT 05/04/2022  Acute Rehabilitation Services  Office [phone]

## 2022-05-04 NOTE — Progress Notes (Signed)
Physical Therapy Treatment Patient Details Name: Terry Wright MRN: [id number] DOB: [birth year] Today's Date: 05/04/2022   History of Present Illness Pt is a 66yo male presenting s/p R-THAAA on 05/02/22. PMH: angina, anxiety, CAD s/p percuatneous coronary angioplasty & cath & stent, COPD, hx of DVT, GERD, HLD, HTN, PVD, DM, hx of STEMI, tobacco use (quit 2015), L-THA 2021.    PT Comments    Pt ambulated 34' with RW, distance limited by 9/10 R lateral knee pain at distal insertion of IT band. Pt was unable to attempt stair training due to pain. And he only tolerated minimal exercises from HEP. He is not yet ready to DC home from a PT standpoint.     Recommendations for follow up therapy are one component of a multi-disciplinary discharge planning process, led by the attending physician.  Recommendations may be updated based on patient status, additional functional criteria and insurance authorization.  Follow Up Recommendations  Follow physician's recommendations for discharge plan and follow up therapies     Assistance Recommended at Discharge Intermittent Supervision/Assistance  Patient can return home with the following A little help with walking and/or transfers;A little help with bathing/dressing/bathroom;Assistance with cooking/housework;Assist for transportation;Help with stairs or ramp for entrance   Equipment Recommendations  None recommended by PT    Recommendations for Other Services       Precautions / Restrictions Precautions Precautions: Fall Restrictions Weight Bearing Restrictions: No     Mobility  Bed Mobility Overal bed mobility: Needs Assistance, Modified Independent Bed Mobility: Supine to Sit     Supine to sit: Modified independent (Device/Increase time), HOB elevated     General bed mobility comments: increased time, used rail    Transfers Overall transfer level: Needs assistance Equipment used: Rolling walker (2 wheels) Transfers: Sit  to/from Stand Sit to Stand: Supervision, From elevated surface           General transfer comment: VCs hand placement    Ambulation/Gait Ambulation/Gait assistance: Supervision Gait Distance (Feet): 40 Feet Assistive device: Rolling walker (2 wheels) Gait Pattern/deviations: Step-to pattern, Knee flexed in stance - right Gait velocity: decreased     General Gait Details: Pt ambulated with RW , no physical assist required or overt LOB noted. Pt with antalgic gait pattern, R knee flexed in stance, Pt complaining of 10/10 pain in R IT band distal insertion, distance limited by pain. Pt didn't feel he could tolerate stair training at present 2* pain.   Stairs             Wheelchair Mobility    Modified Rankin (Stroke Patients Only)       Balance Overall balance assessment: Needs assistance Sitting-balance support: Feet supported, No upper extremity supported Sitting balance-Leahy Scale: Good     Standing balance support: Reliant on assistive device for balance, During functional activity, Bilateral upper extremity supported Standing balance-Leahy Scale: Poor                              Cognition Arousal/Alertness: Awake/alert Behavior During Therapy: WFL for tasks assessed/performed Overall Cognitive Status: Within Functional Limits for tasks assessed                                          Exercises Total Joint Exercises Ankle Circles/Pumps: AROM, Both, 20 reps Heel Slides: AAROM, Right, 5 reps, Supine  General Comments        Pertinent Vitals/Pain Pain Assessment Pain Score: 9  Pain Location: right IT band, lateral knee Pain Descriptors / Indicators: Burning, Grimacing, Guarding Pain Intervention(s): Limited activity within patient's tolerance, Monitored during session, Premedicated before session, Repositioned, Heat applied (Ice applied to hip; heat to R lateral knee)    Home Living                           Prior Function            PT Goals (current goals can now be found in the care plan section) Acute Rehab PT Goals Patient Stated Goal: Going to the gym PT Goal Formulation: With patient Time For Goal Achievement: 05/09/22 Potential to Achieve Goals: Good Progress towards PT goals: Not progressing toward goals - comment (pain limiting progress)    Frequency    7X/week      PT Plan Current plan remains appropriate    Co-evaluation              AM-PAC PT "6 Clicks" Mobility   Outcome Measure  Help needed turning from your back to your side while in a flat bed without using bedrails?: None Help needed moving from lying on your back to sitting on the side of a flat bed without using bedrails?: A Little Help needed moving to and from a bed to a chair (including a wheelchair)?: A Little Help needed standing up from a chair using your arms (e.g., wheelchair or bedside chair)?: A Little Help needed to walk in hospital room?: A Little Help needed climbing 3-5 steps with a railing? : A Little 6 Click Score: 19    End of Session Equipment Utilized During Treatment: Gait belt Activity Tolerance: Patient limited by pain Patient left: with call bell/phone within reach;with SCD's reapplied;in bed;with bed alarm set;with family/visitor present Nurse Communication: Mobility status PT Visit Diagnosis: Pain;Difficulty in walking, not elsewhere classified (R26.2) Pain - Right/Left: Right Pain - part of body: Hip     Time: 5916-3846 PT Time Calculation (min) (ACUTE ONLY): 23 min  Charges:  $Gait Training: 8-22 mins $Therapeutic Exercise: 8-22 mins                     Blondell Reveal Kistler PT 05/04/2022  Acute Rehabilitation Services  Office [phone]

## 2022-05-04 NOTE — Progress Notes (Signed)
Physical Therapy Treatment Patient Details Name: Terry Wright MRN: [id number] DOB: [birth year] Today's Date: 05/04/2022   History of Present Illness Pt is a 66yo male presenting s/p R-THAAA on 05/02/22. PMH: angina, anxiety, CAD s/p percuatneous coronary angioplasty & cath & stent, COPD, hx of DVT, GERD, HLD, HTN, PVD, DM, hx of STEMI, tobacco use (quit 2015), L-THA 2021.    PT Comments    Pt continues to have severe R lateral knee pain at distal insertion of iliotibial band when he attempts to walk. He was only able to tolerate ambulating 24' on this third trial of ambulation today. Pt puts forth good effort and very much wants to be able to walk and go home. I encouraged pt to continue hip ROM exercises but to minimize walking for now in order to allow inflamed ITB to rest. Pt/wife are frustrated and disappointed with this complication. Pt's wife reports she's had 5 back surgeries so is not able to physically assist patient in any ways that require lifting. At present, pt is not ambulating far enough to manage household distances, and is not yet able to go up a step which he'll need to do to enter his home.     Recommendations for follow up therapy are one component of a multi-disciplinary discharge planning process, led by the attending physician.  Recommendations may be updated based on patient status, additional functional criteria and insurance authorization.  Follow Up Recommendations  Follow physician's recommendations for discharge plan and follow up therapies     Assistance Recommended at Discharge Intermittent Supervision/Assistance  Patient can return home with the following A little help with walking and/or transfers;A little help with bathing/dressing/bathroom;Assistance with cooking/housework;Assist for transportation;Help with stairs or ramp for entrance   Equipment Recommendations  None recommended by PT    Recommendations for Other Services       Precautions /  Restrictions Precautions Precautions: Fall Restrictions Weight Bearing Restrictions: No     Mobility  Bed Mobility Overal bed mobility: Needs Assistance, Modified Independent Bed Mobility: Sit to Supine, Supine to Sit     Supine to sit: Modified independent (Device/Increase time) Sit to supine: Min assist   General bed mobility comments: min A for LE into bed    Transfers Overall transfer level: Needs assistance Equipment used: Rolling walker (2 wheels) Transfers: Sit to/from Stand Sit to Stand: Supervision, From elevated surface           General transfer comment: VCs hand placement    Ambulation/Gait Ambulation/Gait assistance: Supervision Gait Distance (Feet): 24 Feet Assistive device: Rolling walker (2 wheels) Gait Pattern/deviations: Step-to pattern, Knee flexed in stance - right Gait velocity: decreased     General Gait Details: Pt attempted to ambulate in hallway, distance limited by severe, burning pain in R lateral knee. Pt unable to attempt stair training.   Stairs             Wheelchair Mobility    Modified Rankin (Stroke Patients Only)       Balance Overall balance assessment: Needs assistance Sitting-balance support: Feet supported, No upper extremity supported Sitting balance-Leahy Scale: Good     Standing balance support: Reliant on assistive device for balance, During functional activity, Bilateral upper extremity supported Standing balance-Leahy Scale: Poor                              Cognition Arousal/Alertness: Awake/alert Behavior During Therapy: WFL for tasks assessed/performed Overall Cognitive Status: Within  Functional Limits for tasks assessed                                          Exercises Total Joint Exercises Ankle Circles/Pumps: AROM, Both, 20 reps Heel Slides: AAROM, Right, 5 reps, Supine Hip ABduction/ADduction: AAROM, Right, 5 reps    General Comments        Pertinent  Vitals/Pain Pain Assessment Pain Score: 9  Pain Location: right IT band, lateral knee Pain Descriptors / Indicators: Grimacing, Guarding, Burning Pain Intervention(s): Limited activity within patient's tolerance, Monitored during session, Premedicated before session, Repositioned, Ice applied    Home Living                          Prior Function            PT Goals (current goals can now be found in the care plan section) Acute Rehab PT Goals Patient Stated Goal: Going to the gym PT Goal Formulation: With patient/family Time For Goal Achievement: 05/09/22 Potential to Achieve Goals: Good Progress towards PT goals: Not progressing toward goals - comment    Frequency    7X/week      PT Plan Current plan remains appropriate    Co-evaluation              AM-PAC PT "6 Clicks" Mobility   Outcome Measure  Help needed turning from your back to your side while in a flat bed without using bedrails?: None Help needed moving from lying on your back to sitting on the side of a flat bed without using bedrails?: A Little Help needed moving to and from a bed to a chair (including a wheelchair)?: A Little Help needed standing up from a chair using your arms (e.g., wheelchair or bedside chair)?: A Little Help needed to walk in hospital room?: A Little Help needed climbing 3-5 steps with a railing? : A Little 6 Click Score: 19    End of Session Equipment Utilized During Treatment: Gait belt Activity Tolerance: Patient limited by pain Patient left: with call bell/phone within reach;in bed;with bed alarm set;with family/visitor present Nurse Communication: Mobility status PT Visit Diagnosis: Pain;Difficulty in walking, not elsewhere classified (R26.2) Pain - Right/Left: Right Pain - part of body: Hip     Time: 1610-9604 PT Time Calculation (min) (ACUTE ONLY): 35 min  Charges:  $Gait Training: 8-22 mins $Therapeutic Exercise: 8-22 mins $Therapeutic Activity:  8-22 mins                     Blondell Reveal Kistler PT 05/04/2022  Acute Rehabilitation Services  Office [phone]

## 2022-05-04 NOTE — Progress Notes (Signed)
Patient ID: Terry Wright, male   DOB: 1955/12/24, 66 y.o.   MRN: [id number] Subjective: 2 Days Post-Op Procedure(s) (LRB): TOTAL HIP ARTHROPLASTY ANTERIOR APPROACH (Right)    Patient reports pain as mild this am Had significant lateral distal thigh pain yesterday with PT preventing him from discharge  Objective:   VITALS:   Vitals:   05/03/22 2325 05/04/22 0520  BP: (!) 142/77 136/67  Pulse:  74  Resp:  18  Temp:  98.6 F (37 C)  SpO2:  97%    Neurovascular intact Incision: dressing C/D/I  LABS Recent Labs    05/03/22 0338 05/04/22 0324  HGB 12.4* 12.8*  HCT 37.8* 38.3*  WBC 12.1* 14.5*  PLT 156 160    Recent Labs    05/03/22 0338  NA 138  K 3.4*  BUN 17  CREATININE 0.81  GLUCOSE 153*    No results for input(s): "LABPT", "INR" in the last 72 hours.   Assessment/Plan: 2 Days Post-Op Procedure(s) (LRB): TOTAL HIP ARTHROPLASTY ANTERIOR APPROACH (Right)   Up with therapy Likely home today after therapy RTC in 2 weeks Heat to distal lateral thigh or ice as needed

## 2022-05-05 ENCOUNTER — Observation Stay (HOSPITAL_COMMUNITY): Payer: PPO

## 2022-05-05 DIAGNOSIS — Z96643 Presence of artificial hip joint, bilateral: Secondary | ICD-10-CM | POA: Diagnosis not present

## 2022-05-05 DIAGNOSIS — Z471 Aftercare following joint replacement surgery: Secondary | ICD-10-CM | POA: Diagnosis not present

## 2022-05-05 DIAGNOSIS — M1611 Unilateral primary osteoarthritis, right hip: Secondary | ICD-10-CM | POA: Diagnosis not present

## 2022-05-05 MED ORDER — ASPIRIN 81 MG PO TBEC: 81.0000 mg | DELAYED_RELEASE_TABLET | Freq: Every day | ORAL | 0 refills | Status: AC

## 2022-05-05 MED ORDER — PREDNISONE 5 MG PO TABS
30.0000 mg | ORAL_TABLET | Freq: Once | ORAL | Status: AC
  Administered 2022-05-05: 30 mg via ORAL
  Filled 2022-05-05: qty 2

## 2022-05-05 NOTE — Progress Notes (Signed)
Physical Therapy Treatment Patient Details Name: Terry Wright MRN: [id number] DOB: 01-18-1956 Today's Date: 05/05/2022   History of Present Illness Pt is a 66yo male presenting s/p R-THA,AA on 05/02/22 post op severe knee pain at IT band insertion, xrays 10/6 negative, began steroids. PMH: angina, anxiety, CAD s/p percuatneous coronary angioplasty & cath & stent, COPD, hx of DVT, GERD, HLD, HTN, PVD, DM, hx of STEMI, tobacco use (quit 2015), L-THA 2021.    PT Comments    Continues to make excellent progress. Reviewed areas as noted below. Wife present for session. Pt is ready to d/c from PT standpoint with wife's assist as needed.   Recommendations for follow up therapy are one component of a multi-disciplinary discharge planning process, led by the attending physician.  Recommendations may be updated based on patient status, additional functional criteria and insurance authorization.  Follow Up Recommendations  Follow physician's recommendations for discharge plan and follow up therapies     Assistance Recommended at Discharge Intermittent Supervision/Assistance  Patient can return home with the following A little help with walking and/or transfers;A little help with bathing/dressing/bathroom;Assistance with cooking/housework;Assist for transportation;Help with stairs or ramp for entrance   Equipment Recommendations  None recommended by PT    Recommendations for Other Services       Precautions / Restrictions Precautions Precautions: Fall Restrictions Weight Bearing Restrictions: No     Mobility  Bed Mobility Overal bed mobility: Modified Independent Bed Mobility: Sit to Supine, Supine to Sit           General bed mobility comments: incr time, HOB elevated no physical assist. pt plans to sleep in lift chair    Transfers Overall transfer level: Needs assistance Equipment used: Rolling walker (2 wheels) Transfers: Sit to/from Stand Sit to Stand: Supervision,  From elevated surface           General transfer comment: VCs hand placement    Ambulation/Gait Ambulation/Gait assistance: Supervision Gait Distance (Feet): 60 Feet Assistive device: Rolling walker (2 wheels) Gait Pattern/deviations: Step-to pattern Gait velocity: decreased     General Gait Details: guarded gait d/t recent/y incr  pain  with amb. no incr pain, able to advance RLE of his own volition, cues for safety with turns. supervision for safety   Stairs Stairs: Yes Stairs assistance: Min guard Stair Management: Step to pattern, Forwards, With walker Number of Stairs: 1 General stair comments: cues for sequence, wife present for session   Wheelchair Mobility    Modified Rankin (Stroke Patients Only)       Balance   Sitting-balance support: Feet supported, No upper extremity supported Sitting balance-Leahy Scale: Good       Standing balance-Leahy Scale: Fair                              Cognition Arousal/Alertness: Awake/alert Behavior During Therapy: WFL for tasks assessed/performed Overall Cognitive Status: Within Functional Limits for tasks assessed                                          Exercises      General Comments        Pertinent Vitals/Pain Pain Assessment Pain Assessment: 0-10 Pain Score: 3  Pain Location: thigh Pain Descriptors / Indicators: Grimacing, Guarding, Burning Pain Intervention(s): Limited activity within patient's tolerance, Monitored during session, Premedicated before session, Repositioned  Home Living                          Prior Function            PT Goals (current goals can now be found in the care plan section) Acute Rehab PT Goals Patient Stated Goal: Going to the gym PT Goal Formulation: With patient/family Time For Goal Achievement: 05/09/22 Potential to Achieve Goals: Good Progress towards PT goals: Progressing toward goals    Frequency     7X/week      PT Plan Current plan remains appropriate    Co-evaluation              AM-PAC PT "6 Clicks" Mobility   Outcome Measure  Help needed turning from your back to your side while in a flat bed without using bedrails?: None Help needed moving from lying on your back to sitting on the side of a flat bed without using bedrails?: A Little Help needed moving to and from a bed to a chair (including a wheelchair)?: A Little Help needed standing up from a chair using your arms (e.g., wheelchair or bedside chair)?: A Little Help needed to walk in hospital room?: A Little Help needed climbing 3-5 steps with a railing? : A Little 6 Click Score: 19    End of Session Equipment Utilized During Treatment: Gait belt Activity Tolerance: Patient tolerated treatment well Patient left: in chair;with call bell/phone within reach;with chair alarm set;with family/visitor present Nurse Communication: Mobility status PT Visit Diagnosis: Pain;Difficulty in walking, not elsewhere classified (R26.2) Pain - Right/Left: Right Pain - part of body: Hip     Time: 5726-2035 PT Time Calculation (min) (ACUTE ONLY): 18 min  Charges:  $Gait Training: 8-22 mins                     [name] Flattery, PT  Acute Rehab Dept [facility]) [phone]  [facility] Weekend Pager ([place] only)  [phone]  05/05/2022    [facility] 05/05/2022, 3:03 PM

## 2022-05-05 NOTE — Progress Notes (Signed)
Patient ID: Terry Wright, male   DOB: 1956-05-30, 66 y.o.   MRN: [id number] Subjective: 3 Days Post-Op Procedure(s) (LRB): TOTAL HIP ARTHROPLASTY ANTERIOR APPROACH (Right)    Patient reports pain as mild while laying in bed and with some passive and active assist movements in the bed however reports intense pain in lateral thigh with weight bearing  Objective:   VITALS:   Vitals:   05/04/22 2148 05/05/22 0437  BP: 121/71 (!) 126/54  Pulse: 67 77  Resp: 16 18  Temp: 98.8 F (37.1 C) 99.6 F (37.6 C)  SpO2: 97% 95%    Neurovascular intact Incision: dressing C/D/I No significant ecchymosis laterally in thigh No significant swelling Mild tenderness this am  LABS Recent Labs    05/03/22 0338 05/04/22 0324  HGB 12.4* 12.8*  HCT 37.8* 38.3*  WBC 12.1* 14.5*  PLT 156 160    Recent Labs    05/03/22 0338  NA 138  K 3.4*  BUN 17  CREATININE 0.81  GLUCOSE 153*    No results for input(s): "LABPT", "INR" in the last 72 hours.   Assessment/Plan: 3 Days Post-Op Procedure(s) (LRB): TOTAL HIP ARTHROPLASTY ANTERIOR APPROACH (Right)   Up with therapy I have ordered a new Xray of his hip to rule out any bone injury Immediate post op xrays look good without periprosthetic fracture  I have ordered prednisone which we could continue as a taper if it provides some benefit Continue to work on mobilization for a safe home discharge [place] to go home today if he can clear PT to function independently given the reports of his wife's inability to support

## 2022-05-05 NOTE — Plan of Care (Signed)
Plan of care reviewed and discussed with the patient. 

## 2022-05-05 NOTE — Plan of Care (Signed)
All discharge instructions were given to Pt and her Spouse. Discussed pain management. All questions were answered.

## 2022-05-05 NOTE — Progress Notes (Signed)
Physical Therapy Treatment Patient Details Name: Terry Wright MRN: [id number] DOB: 18-Jul-1956 Today's Date: 05/05/2022   History of Present Illness Pt is a 66yo male presenting s/p R-THA,AA on 05/02/22 post op severe knee pain at IT band insertion, xrays 10/6 negative, began steroids. PMH: angina, anxiety, CAD s/p percuatneous coronary angioplasty & cath & stent, COPD, hx of DVT, GERD, HLD, HTN, PVD, DM, hx of STEMI, tobacco use (quit 2015), L-THA 2021.    PT Comments    Pt progressing well this session, decr knee pain with amb. Reviewed "foam rolling" R quads, progressing to IT band--given can of soup to use as a roller. If pain remains under control pt should be able to d/c from PT standpoint. Has single step to enter home, will need to review prior to d/c   Recommendations for follow up therapy are one component of a multi-disciplinary discharge planning process, led by the attending physician.  Recommendations may be updated based on patient status, additional functional criteria and insurance authorization.  Follow Up Recommendations  Follow physician's recommendations for discharge plan and follow up therapies     Assistance Recommended at Discharge Intermittent Supervision/Assistance  Patient can return home with the following A little help with walking and/or transfers;A little help with bathing/dressing/bathroom;Assistance with cooking/housework;Assist for transportation;Help with stairs or ramp for entrance   Equipment Recommendations  None recommended by PT    Recommendations for Other Services       Precautions / Restrictions Precautions Precautions: Fall Restrictions Weight Bearing Restrictions: No     Mobility  Bed Mobility Overal bed mobility: Modified Independent Bed Mobility: Sit to Supine, Supine to Sit           General bed mobility comments: incr time, HOB elevated no physical assist. pt plans to sleep in lift chair    Transfers Overall  transfer level: Needs assistance Equipment used: Rolling walker (2 wheels) Transfers: Sit to/from Stand Sit to Stand: Supervision, From elevated surface           General transfer comment: VCs hand placement    Ambulation/Gait Ambulation/Gait assistance: Supervision Gait Distance (Feet): 50 Feet Assistive device: Rolling walker (2 wheels) Gait Pattern/deviations: Step-to pattern Gait velocity: decreased     General Gait Details: guarded gait d/t recent/y incr  pain  with amb. no incr pain, able to advance RLE of his own volition, cues for safety with turns. supervision for safety   Stairs             Wheelchair Mobility    Modified Rankin (Stroke Patients Only)       Balance                                            Cognition Arousal/Alertness: Awake/alert Behavior During Therapy: WFL for tasks assessed/performed Overall Cognitive Status: Within Functional Limits for tasks assessed                                          Exercises      General Comments        Pertinent Vitals/Pain Pain Assessment Pain Assessment: 0-10 Pain Score: 2  Pain Location: thigh Pain Descriptors / Indicators: Grimacing, Guarding, Burning Pain Intervention(s): Limited activity within patient's tolerance, Monitored during session, Premedicated before session, Repositioned  Home Living                          Prior Function            PT Goals (current goals can now be found in the care plan section) Acute Rehab PT Goals Patient Stated Goal: Going to the gym PT Goal Formulation: With patient/family Time For Goal Achievement: 05/09/22 Potential to Achieve Goals: Good Progress towards PT goals: Progressing toward goals    Frequency    7X/week      PT Plan Current plan remains appropriate    Co-evaluation              AM-PAC PT "6 Clicks" Mobility   Outcome Measure  Help needed turning from your back  to your side while in a flat bed without using bedrails?: None Help needed moving from lying on your back to sitting on the side of a flat bed without using bedrails?: A Little Help needed moving to and from a bed to a chair (including a wheelchair)?: A Little Help needed standing up from a chair using your arms (e.g., wheelchair or bedside chair)?: A Little Help needed to walk in hospital room?: A Little Help needed climbing 3-5 steps with a railing? : A Little 6 Click Score: 19    End of Session Equipment Utilized During Treatment: Gait belt Activity Tolerance: Patient tolerated treatment well Patient left: in chair;with call bell/phone within reach;with chair alarm set Nurse Communication: Mobility status PT Visit Diagnosis: Pain;Difficulty in walking, not elsewhere classified (R26.2) Pain - Right/Left: Right Pain - part of body: Hip     Time: 8937-3428 PT Time Calculation (min) (ACUTE ONLY): 27 min  Charges:  $Gait Training: 23-37 mins                     [name] Flattery, PT  Acute Rehab Dept [facility]) [phone]  [facility] Weekend Pager ([place] only)  [phone]  05/05/2022    [facility] 05/05/2022, 1:09 PM

## 2022-05-08 ENCOUNTER — Telehealth: Payer: PPO | Admitting: Family Medicine

## 2022-05-08 ENCOUNTER — Other Ambulatory Visit: Payer: Self-pay

## 2022-05-08 ENCOUNTER — Emergency Department (HOSPITAL_COMMUNITY)
Admission: EM | Admit: 2022-05-08 | Discharge: 2022-05-08 | Disposition: A | Discharge status: Home / Self Care | Payer: PPO | Attending: Emergency Medicine | Admitting: Emergency Medicine

## 2022-05-08 ENCOUNTER — Telehealth: Payer: Self-pay

## 2022-05-08 ENCOUNTER — Telehealth: Payer: Self-pay | Admitting: Family Medicine

## 2022-05-08 DIAGNOSIS — I10 Essential (primary) hypertension: Secondary | ICD-10-CM | POA: Diagnosis not present

## 2022-05-08 DIAGNOSIS — Z7982 Long term (current) use of aspirin: Secondary | ICD-10-CM | POA: Diagnosis not present

## 2022-05-08 DIAGNOSIS — K5900 Constipation, unspecified: Secondary | ICD-10-CM | POA: Diagnosis not present

## 2022-05-08 DIAGNOSIS — Z79899 Other long term (current) drug therapy: Secondary | ICD-10-CM | POA: Insufficient documentation

## 2022-05-08 LAB — CBC WITH DIFFERENTIAL/PLATELET
Abs Immature Granulocytes: 0.11 10*3/uL — ABNORMAL HIGH (ref 0.00–0.07)
Basophils Absolute: 0 10*3/uL (ref 0.0–0.1)
Basophils Relative: 0 %
Eosinophils Absolute: 0.1 10*3/uL (ref 0.0–0.5)
Eosinophils Relative: 1 %
HCT: 42.6 % (ref 39.0–52.0)
Hemoglobin: 14.1 g/dL (ref 13.0–17.0)
Immature Granulocytes: 1 %
Lymphocytes Relative: 20 %
Lymphs Abs: 2.3 10*3/uL (ref 0.7–4.0)
MCH: 29.9 pg (ref 26.0–34.0)
MCHC: 33.1 g/dL (ref 30.0–36.0)
MCV: 90.3 fL (ref 80.0–100.0)
Monocytes Absolute: 1.3 10*3/uL — ABNORMAL HIGH (ref 0.1–1.0)
Monocytes Relative: 12 %
Neutro Abs: 7.7 10*3/uL (ref 1.7–7.7)
Neutrophils Relative %: 66 %
Platelets: 257 10*3/uL (ref 150–400)
RBC: 4.72 MIL/uL (ref 4.22–5.81)
RDW: 13.7 % (ref 11.5–15.5)
WBC: 11.6 10*3/uL — ABNORMAL HIGH (ref 4.0–10.5)
nRBC: 0.2 % (ref 0.0–0.2)

## 2022-05-08 LAB — TYPE AND SCREEN
ABO/RH(D): O POS
Antibody Screen: NEGATIVE

## 2022-05-08 LAB — COMPREHENSIVE METABOLIC PANEL
ALT: 76 U/L — ABNORMAL HIGH (ref 0–44)
AST: 53 U/L — ABNORMAL HIGH (ref 15–41)
Albumin: 3.7 g/dL (ref 3.5–5.0)
Alkaline Phosphatase: 57 U/L (ref 38–126)
Anion gap: 7 (ref 5–15)
BUN: 27 mg/dL — ABNORMAL HIGH (ref 8–23)
CO2: 28 mmol/L (ref 22–32)
Calcium: 8.9 mg/dL (ref 8.9–10.3)
Chloride: 101 mmol/L (ref 98–111)
Creatinine, Ser: 0.89 mg/dL (ref 0.61–1.24)
GFR, Estimated: >60 mL/min (ref >60)
Glucose, Bld: 159 mg/dL — ABNORMAL HIGH (ref 70–99)
Potassium: 3.7 mmol/L (ref 3.5–5.1)
Sodium: 136 mmol/L (ref 135–145)
Total Bilirubin: 0.8 mg/dL (ref 0.3–1.2)
Total Protein: 7.8 g/dL (ref 6.5–8.1)

## 2022-05-08 LAB — POC OCCULT BLOOD, ED: Fecal Occult Bld: POSITIVE — AB

## 2022-05-08 MED ORDER — FLEET ENEMA 7-19 GM/118ML RE ENEM
1.0000 | ENEMA | Freq: Once | RECTAL | Status: AC
  Administered 2022-05-08: 1 via RECTAL
  Filled 2022-05-08: qty 1

## 2022-05-08 NOTE — Telephone Encounter (Signed)
Called and spoke with Sharee Pimple and she states that PT/OT are  not indicated for patients after hip replacements which is why Dr. Alvan Dame did not order/sign off on agreeing to this for pt and there are no complications as previously indicated by pt wife. Dr. Alvan Dame nurse has already spoken with pt wife this morning and informed pt that physical therapy is not recommended after hip replacement due to it causing aggravation and potential damage to the new hip/area. They are NOT agreeing to pt doing PT/OT at this time.

## 2022-05-08 NOTE — Telephone Encounter (Signed)
See below

## 2022-05-08 NOTE — Telephone Encounter (Signed)
You can provide verbal orders to  home health listed below today but please get him scheduled before the end of the month for follow-up as that is also required for the visit-if they would like to do virtual visit at the end of the day I am open to that-just let them know there is a chance of the running behind even at 420 slot

## 2022-05-08 NOTE — Telephone Encounter (Signed)
See below, please schedule OV.

## 2022-05-08 NOTE — ED Triage Notes (Addendum)
Patient BIB EMS from home c/o constipation and rectal bleeding. Per report pt had no bowel movement since last Tuesday. Pt report he's been taking stool softeners for constipation. Pt report bright red blood on his rectum. Hx Total hip replace ment last Tuesday 05/02/22. Pt taking blood thiners.  BP 158/86 HR 84 RR20 O2sat 98% on RA

## 2022-05-08 NOTE — Telephone Encounter (Signed)
Sharee Pimple with [facility] is asking for a call back regarding this patient. Cell number is [phone]

## 2022-05-08 NOTE — Discharge Instructions (Signed)
Return for any problem.  If you develop recurrent bleeding, dark tarry stool, other emergent symptoms please call 911.

## 2022-05-08 NOTE — ED Provider Notes (Signed)
[facility] DEPT Provider Note   CSN: [id number] Arrival date & time: 05/08/22  1910     History  Chief Complaint  Patient presents with   Constipation    Terry Wright is a 66 y.o. male.  66 year old male with prior medical history as detailed below presents for evaluation.  Patient reports right total hip replacement on Tuesday, October 3.  Patient reports that he had to stay in the hospital for several days secondary to pain after the procedure.  Patient reports significant problems with constipation after discharge.  He reports that he feels like there is a large bowel movement that he cannot pass.  He was straining earlier today trying to pass this bowel movement and he saw some small drops of bright red blood on toilet paper that he used to wipe.  He is on Xarelto.  He is concerned that he may have significant internal bleeding.  He denies abdominal pain.  He denies nausea or vomiting.  He denies fevers.  He he is using stool softener, MiraLAX, and fiber supplements at home without improvement in his constipation.  The history is provided by the patient and medical records.       Home Medications Prior to Admission medications   Medication Sig Start Date End Date Taking? Authorizing Provider  acetaminophen (TYLENOL) 500 MG tablet Take 2 tablets (1,000 mg total) by mouth every 6 (six) hours. 05/03/22   Paralee Cancel, MD  amLODipine (NORVASC) 2.5 MG tablet Take 1 tablet (2.5 mg total) by mouth daily. 04/05/21   Marin Olp, MD  aspirin EC 81 MG tablet Take 1 tablet (81 mg total) by mouth daily. Swallow whole. Take while on Xarelto. When Xarelto stopped, resume Plavix 05/06/22 06/05/22  Paralee Cancel, MD  carvedilol (COREG) 12.5 MG tablet Take 1 tablet (12.5 mg total) by mouth 2 (two) times daily. 10/28/21   Leonie Man, MD  docusate sodium (COLACE) 100 MG capsule Take 1 capsule (100 mg total) by mouth 2 (two) times daily. 05/03/22    Paralee Cancel, MD  HYDROmorphone (DILAUDID) 2 MG tablet Take 1 tablet (2 mg total) by mouth every 4 (four) hours as needed for moderate pain (pains score 4-6). 05/03/22   Paralee Cancel, MD  LORazepam (ATIVAN) 0.5 MG tablet TAKE 1 TABLET BY MOUTH AT BEDTIME AS NEEDED FOR ANXIETY (DO NOT DRIVE FOR EIGHT HOURS AFTER TAKING) 01/23/22   Marin Olp, MD  methocarbamol (ROBAXIN) 500 MG tablet Take 1 tablet (500 mg total) by mouth every 6 (six) hours as needed for muscle spasms. 05/03/22   Paralee Cancel, MD  nitroGLYCERIN (NITROSTAT) 0.4 MG SL tablet Place 1 tablet (0.4 mg total) under the tongue every 5 (five) minutes as needed for chest pain. 10/11/20   Marin Olp, MD  ondansetron (ZOFRAN) 4 MG tablet Take 1 tablet (4 mg total) by mouth every 6 (six) hours as needed for nausea. 05/03/22   Paralee Cancel, MD  pantoprazole (PROTONIX) 40 MG tablet Take 1 tablet (40 mg total) by mouth daily. 11/01/21   Leonie Man, MD  polyethylene glycol (MIRALAX / GLYCOLAX) 17 g packet Take 17 g by mouth 2 (two) times daily. To prevent constipation while on narcotics 05/03/22   Paralee Cancel, MD  rivaroxaban (XARELTO) 10 MG TABS tablet Take 1 tablet (10 mg total) by mouth daily with breakfast. 05/03/22   Paralee Cancel, MD  rosuvastatin (CRESTOR) 40 MG tablet Take 1 tablet (40 mg total) by mouth daily.  04/07/22   Marin Olp, MD  triamcinolone lotion (KENALOG) 0.1 % Apply 1 application  topically 2 (two) times daily as needed (skin irritation). Mix lotion with cream and apply twice daily to affected area    [provider]      Allergies    Metformin and Metformin and related    Review of Systems   Review of Systems  All other systems reviewed and are negative.   Physical Exam Updated Vital Signs BP 124/75   Pulse 79   Temp 98 F (36.7 C)   Resp (!) 27   Ht '6\' 1"'$  (1.854 m)   Wt 95.3 kg   SpO2 100%   BMI 27.71 kg/m  Physical Exam Vitals and nursing note reviewed.  Constitutional:       General: He is not in acute distress.    Appearance: Normal appearance. He is well-developed.  HENT:     Head: Normocephalic and atraumatic.  Eyes:     Conjunctiva/sclera: Conjunctivae normal.     Pupils: Pupils are equal, round, and reactive to light.  Cardiovascular:     Rate and Rhythm: Normal rate and regular rhythm.     Heart sounds: Normal heart sounds.  Pulmonary:     Effort: Pulmonary effort is normal. No respiratory distress.     Breath sounds: Normal breath sounds.  Abdominal:     General: There is no distension.     Palpations: Abdomen is soft.     Tenderness: There is no abdominal tenderness.  Genitourinary:    Comments: Multiple mildly inflamed hemorrhoids present on external exam of the rectum.  No active bleeding noted.  No thrombosed hemorrhoid present. Hard stool present in rectal vault. Musculoskeletal:        General: No deformity. Normal range of motion.     Cervical back: Normal range of motion and neck supple.  Skin:    General: Skin is warm and dry.  Neurological:     General: No focal deficit present.     Mental Status: He is alert and oriented to person, place, and time.     ED Results / Procedures / Treatments   Labs (all labs ordered are listed, but only abnormal results are displayed) Labs Reviewed  CBC WITH DIFFERENTIAL/PLATELET - Abnormal; Notable for the following components:      Result Value   WBC 11.6 (*)    Monocytes Absolute 1.3 (*)    Abs Immature Granulocytes 0.11 (*)    All other components within normal limits  COMPREHENSIVE METABOLIC PANEL - Abnormal; Notable for the following components:   Glucose, Bld 159 (*)    BUN 27 (*)    AST 53 (*)    ALT 76 (*)    All other components within normal limits  POC OCCULT BLOOD, ED - Abnormal; Notable for the following components:   Fecal Occult Bld POSITIVE (*)    All other components within normal limits  TYPE AND SCREEN    EKG None  Radiology No results  found.  Procedures Procedures    Medications Ordered in ED Medications  sodium phosphate (FLEET) 7-19 GM/118ML enema 1 enema (1 enema Rectal Given 05/08/22 2055)    ED Course/ Medical Decision Making/ A&P                           Medical Decision Making Amount and/or Complexity of Data Reviewed Labs: ordered.  Risk OTC drugs.    Medical  Screen Complete  This patient presented to the ED with complaint of constipation, rectal bleeding.  This complaint involves an extensive number of treatment options. The initial differential diagnosis includes, but is not limited to, constipation, hemorrhoids, GI bleed, etc.  This presentation is: Acute, Self-Limited, Previously Undiagnosed, Uncertain Prognosis, Complicated, Systemic Symptoms, and Threat to Life/Bodily Function  Patient with recent right hip arthroplasty. Patient on significant levels of narcotics for control of pain related to his recent surgery.  Patient reports significant constipation over the last several days related to narcotic use.  Patient with noted minimal amount of rectal bleeding with attempted bowel movement.  Patient improved significantly with enema and passage of very large BM.  Screening labs obtained are without significant abnormality.  Notably, patient's hemoglobin is higher than at recent discharge.  Patient's bleeding is most likely related to attempted passage of large BM in the setting of hemorrhoids and significant constipation.  Patient offered overnight observation given his bleeding and use of Xarelto.  Patient declines overnight observation and/or admission.  He desires discharge home.  He reports significantly improved now that he has had his bowel movement.  At home management of his constipation extensively discussed with both the patient and his wife.  Importance of close follow-up is repeatedly stressed.  Strict return precautions given and understood.   Additional history  obtained: External records from outside sources obtained and reviewed including prior ED visits and prior Inpatient records.    Lab Tests:  I ordered and personally interpreted labs.  The pertinent results include: CBC, CMP, type and screen   Cardiac Monitoring:  The patient was maintained on a cardiac monitor.  I personally viewed and interpreted the cardiac monitor which showed an underlying rhythm of: NSR   Medicines ordered:  I ordered medication including enema  for constipation  Reevaluation of the patient after these medicines showed that the patient: improved   Problem List / ED Course:  Constipation, rectal bleeding, hemorrhoids   Reevaluation:  After the interventions noted above, I reevaluated the patient and found that they have: improved  Disposition:  After consideration of the diagnostic results and the patients response to treatment, I feel that the patent would benefit from close outpatient follow-up.          Final Clinical Impression(s) / ED Diagnoses Final diagnoses:  Constipation, unspecified constipation type    Rx / DC Orders ED Discharge Orders     None         Valarie Merino, MD 05/08/22 2152

## 2022-05-08 NOTE — Telephone Encounter (Signed)
Patient states never wanted home health PT- patient states he would not do hh PT because Dr. Alvan Dame told him not to. I spoke with Sharee Pimple- RN with [facility] and she educated me that [facility] almost never does SNF or Home health - very helpful information  - sounds like it was either for nurse visits or home health aide even but not 100% clear. Regardless- patient asks I cancel visit today and any home health orders- Bevelyn Ngo- can you cancel the visit ASAP and also call back to cancel any orders if placed

## 2022-05-08 NOTE — Telephone Encounter (Signed)
Appt cancelled and no VO were given to [facility].

## 2022-05-08 NOTE — Telephone Encounter (Signed)
Patient's wife wants a Virtual visit today stating Orders need to be signed today. Can he schedule Virtual in today?  Please advise.

## 2022-05-08 NOTE — Telephone Encounter (Signed)
Patient wife called needing orders for post operative weakness and strengthen assistance sent to [facility]. Patient was discharged from hospital Friday after hip surgery and had some complications. Will need some assistance at home as wife is going back to work on Wednesday. Please fax orders to [phone] Attn Lattie Haw

## 2022-05-08 NOTE — Telephone Encounter (Signed)
For me to write for these we have to have a face to face visit. I THINK virtual still works- but my understanding is phone call does not. I can write these once we confirm visit is scheduled within next few weeks

## 2022-05-17 NOTE — Discharge Summary (Signed)
Physician Discharge Summary   Patient ID: Terry Wright MRN: [id number] DOB/AGE: 1955-11-16 66 y.o.  Admit date: 05/02/2022 Discharge date: 05/05/2022  Primary Diagnosis: Right hip osteoarthritis.   Admission Diagnoses:  Past Medical History:  Diagnosis Date   Anginal pain ([place]) 2018   Anxiety    Arthritis    shoulders hips,    CAD S/P percutaneous coronary angioplasty 07/2016, 02/2018   a) 100% pCx -- DES PCI with 4.0 x 16 mm Synergy DES. 65% ostial OM 3 (relatively small caliber - Med Rx);; b) 03/27/18 PCI/DES to dLCX/OM3 x1. Patent pCX DES. 70% dRCA trifurcation lesion (Med Rx). Normal EF   COPD (chronic obstructive pulmonary disease) ([place]) 2018   mild   DVT (deep venous thrombosis) (HCC)    Right upper arm DVT on 2 occasions in the remote past   Dysrhythmia    V tac   Ejection fraction    LV function normal, echo, February, 2010   GERD (gastroesophageal reflux disease)    Hyperlipidemia    Hypertension    Mitral regurgitation    Mild, echo, February, 2010   Palpitations    Mild in the past   Peripheral vascular disease ([place]) 1980s   DVT   Pre-diabetes    Statin intolerance    Felt poorly after Lipitor, Crestor, TriCor   STEMI involving left circumflex coronary artery ([place]) 08/04/2016   Occluded very large caliber codominant circumflex - PCI with single Synergy DES   Tobacco abuse    Discharge Diagnoses:   Principal Problem:   S/P total right hip arthroplasty  Estimated body mass index is 27.71 kg/m as calculated from the following:   Height as of this encounter: '6\' 1"'$  (1.854 m).   Weight as of this encounter: 95.3 kg.  Procedure:  Procedure(s) (LRB): TOTAL HIP ARTHROPLASTY ANTERIOR APPROACH (Right)   Consults: None  HPI:  Terry Wright is a 66 y.o. male who had   presented to office for evaluation of right hip pain.  Radiographs revealed   progressive degenerative changes with bone-on-bone   articulation of the  hip joint, including  subchondral cystic changes and osteophytes.  The patient had painful limited range of   motion significantly affecting their overall quality of life and function.  The patient was failing to    respond to conservative measures including medications and/or injections and activity modification and at this point was ready   to proceed with more definitive measures.  Consent was obtained for   benefit of pain relief.  Specific risks of infection, DVT, component   failure, dislocation, neurovascular injury, and need for revision surgery were reviewed in the office.  Laboratory Data: Admission on 05/02/2022, Discharged on 05/05/2022  Component Date Value Ref Range Status   Glucose-Capillary 05/02/2022 147 (H)  70 - 99 mg/dL Final   Glucose reference range applies only to samples taken after fasting for at least 8 hours.   WBC 05/03/2022 12.1 (H)  4.0 - 10.5 K/uL Final   RBC 05/03/2022 4.14 (L)  4.22 - 5.81 MIL/uL Final   Hemoglobin 05/03/2022 12.4 (L)  13.0 - 17.0 g/dL Final   HCT 05/03/2022 37.8 (L)  39.0 - 52.0 % Final   MCV 05/03/2022 91.3  80.0 - 100.0 fL Final   MCH 05/03/2022 30.0  26.0 - 34.0 pg Final   MCHC 05/03/2022 32.8  30.0 - 36.0 g/dL Final   RDW 05/03/2022 13.4  11.5 - 15.5 % Final   Platelets 05/03/2022 156  150 -  400 K/uL Final   nRBC 05/03/2022 0.0  0.0 - 0.2 % Final   Performed at [facility], [place] [address], [place], [place] [address]   Sodium 05/03/2022 138  135 - 145 mmol/L Final   Potassium 05/03/2022 3.4 (L)  3.5 - 5.1 mmol/L Final   Chloride 05/03/2022 108  98 - 111 mmol/L Final   CO2 05/03/2022 24  22 - 32 mmol/L Final   Glucose, Bld 05/03/2022 153 (H)  70 - 99 mg/dL Final   Glucose reference range applies only to samples taken after fasting for at least 8 hours.   BUN 05/03/2022 17  8 - 23 mg/dL Final   Creatinine, Ser 05/03/2022 0.81  0.61 - 1.24 mg/dL Final   Calcium 05/03/2022 8.3 (L)  8.9 - 10.3 mg/dL Final   GFR, Estimated 05/03/2022 >60   >60 mL/min Final   Comment: (NOTE) Calculated using the CKD-EPI Creatinine Equation (2021)    Anion gap 05/03/2022 6  5 - 15 Final   Performed at [facility], [place] [address], [place], [place] [address]   WBC 05/04/2022 14.5 (H)  4.0 - 10.5 K/uL Final   RBC 05/04/2022 4.23  4.22 - 5.81 MIL/uL Final   Hemoglobin 05/04/2022 12.8 (L)  13.0 - 17.0 g/dL Final   HCT 05/04/2022 38.3 (L)  39.0 - 52.0 % Final   MCV 05/04/2022 90.5  80.0 - 100.0 fL Final   MCH 05/04/2022 30.3  26.0 - 34.0 pg Final   MCHC 05/04/2022 33.4  30.0 - 36.0 g/dL Final   RDW 05/04/2022 13.9  11.5 - 15.5 % Final   Platelets 05/04/2022 160  150 - 400 K/uL Final   nRBC 05/04/2022 0.0  0.0 - 0.2 % Final   Performed at [facility], [place] [address], [place], [place] [address]  Hospital Outpatient Visit on 04/20/2022  Component Date Value Ref Range Status   MRSA, PCR 04/20/2022 NEGATIVE  NEGATIVE Final   Staphylococcus aureus 04/20/2022 POSITIVE (A)  NEGATIVE Final   Comment: (NOTE) The Xpert SA Assay (FDA approved for NASAL specimens in patients 67 years of age and older), is one component of a comprehensive surveillance program. It is not intended to diagnose infection nor to guide or monitor treatment. Performed at [facility] [place], [place] [address], [place], [place] [address]    ABO/RH(D) 04/20/2022 O POS   Final   Antibody Screen 04/20/2022 NEG   Final   Sample Expiration 04/20/2022 05/05/2022,2359   Final   Extend sample reason 04/20/2022    Final                   Value:NO TRANSFUSIONS OR PREGNANCY IN THE PAST 3 MONTHS Performed at [place] [address] [place], [place] [address]    Hgb A1c MFr Bld 04/20/2022 6.3 (H)  4.8 - 5.6 % Final   Comment: (NOTE) Pre diabetes:          5.7%-6.4%  Diabetes:              >6.4%  Glycemic control for   <7.0% adults with diabetes    Mean Plasma Glucose 04/20/2022 134.11  mg/dL Final   Performed  at [facility], [place] [address], [place], [place] [address]   Sodium 04/20/2022 141  135 - 145 mmol/L Final   Potassium 04/20/2022 4.6  3.5 - 5.1 mmol/L Final   Chloride 04/20/2022 109  98 - 111 mmol/L Final  CO2 04/20/2022 25  22 - 32 mmol/L Final   Glucose, Bld 04/20/2022 111 (H)  70 - 99 mg/dL Final   Glucose reference range applies only to samples taken after fasting for at least 8 hours.   BUN 04/20/2022 26 (H)  8 - 23 mg/dL Final   Creatinine, Ser 04/20/2022 0.95  0.61 - 1.24 mg/dL Final   Calcium 04/20/2022 9.6  8.9 - 10.3 mg/dL Final   GFR, Estimated 04/20/2022 >60  >60 mL/min Final   Comment: (NOTE) Calculated using the CKD-EPI Creatinine Equation (2021)    Anion gap 04/20/2022 7  5 - 15 Final   Performed at [facility], [place] [address], [place], [place] [address]   WBC 04/20/2022 7.1  4.0 - 10.5 K/uL Final   RBC 04/20/2022 4.84  4.22 - 5.81 MIL/uL Final   Hemoglobin 04/20/2022 14.4  13.0 - 17.0 g/dL Final   HCT 04/20/2022 44.4  39.0 - 52.0 % Final   MCV 04/20/2022 91.7  80.0 - 100.0 fL Final   MCH 04/20/2022 29.8  26.0 - 34.0 pg Final   MCHC 04/20/2022 32.4  30.0 - 36.0 g/dL Final   RDW 04/20/2022 13.7  11.5 - 15.5 % Final   Platelets 04/20/2022 198  150 - 400 K/uL Final   nRBC 04/20/2022 0.0  0.0 - 0.2 % Final   Performed at [facility], [place] [address] [place], [place] [address]   Glucose-Capillary 04/20/2022 114 (H)  70 - 99 mg/dL Final   Glucose reference range applies only to samples taken after fasting for at least 8 hours.     X-Rays:DG Pelvis Portable  Result Date: 05/05/2022 CLINICAL DATA:  Status post right hip replacement EXAM: PORTABLE PELVIS 2 VIEWS COMPARISON:  Radiograph dated May 02, 2022 FINDINGS: Interval postsurgical changes from right total hip arthroplasty. Arthroplasty components appear in their expected alignment. No periprosthetic fracture is identified. Prior total left hip arthroplasty. Expected  postoperative changes within the overlying soft tissues. IMPRESSION: Postsurgical changes from right total hip arthroplasty. Electronically Signed   By: Yetta Glassman M.D.   On: 05/05/2022 08:50   DG Pelvis Portable  Result Date: 05/02/2022 CLINICAL DATA:  Status post right total hip arthroplasty. EXAM: PORTABLE PELVIS 1-2 VIEWS COMPARISON:  April 08, 2020. FINDINGS: There is been interval placement of right total hip arthroplasty. The right acetabular and femoral components are well situated. Expected postoperative changes are seen in the surrounding soft tissues. Previously placed left hip arthroplasty is again noted. IMPRESSION: Interval placement of right total hip arthroplasty. Electronically Signed   By: Marijo Conception M.D.   On: 05/02/2022 15:47   DG HIP PORT UNILAT WITH PELVIS 1V RIGHT  Result Date: 05/02/2022 CLINICAL DATA:  Status post right hip arthroplasty. EXAM: DG HIP (WITH OR WITHOUT PELVIS) 1V PORT RIGHT COMPARISON:  Intraoperative fluoroscopy for total left hip arthroplasty 04/08/2020; AP pelvis 04/08/2020 FINDINGS: Images were performed intraoperatively without the presence of a radiologist. Postsurgical changes are seen of new total right hip arthroplasty. Partial visualization of prior total left hip arthroplasty. Total fluoroscopy images: 2 Total fluoroscopy time: 11 seconds Total dose: Radiation Exposure Index (as provided by the fluoroscopic device): 1.04 mGy air Kerma Please see intraoperative findings for further detail. IMPRESSION: Intraoperative fluoroscopic guidance for total right hip arthroplasty. Electronically Signed   By: Yvonne Kendall M.D.   On: 05/02/2022 13:13   DG C-Arm 1-60 Min-No Report  Result Date: 05/02/2022 Fluoroscopy was utilized by the requesting physician.  No radiographic interpretation.   DG C-Arm 1-60 Min-No Report  Result Date: 05/02/2022 Fluoroscopy was utilized by the requesting physician.  No radiographic interpretation.    EKG: Orders  placed or performed in visit on 12/05/21   EKG 12-Lead     Hospital Course: Terry Wright is a 66 y.o. who was admitted to [facility] They were brought to the operating room on 05/02/2022 and underwent Procedure(s): [place].  Patient tolerated the procedure well and was later transferred to the recovery room and then to the orthopaedic floor for postoperative care. They were given PO and IV analgesics for pain control following their surgery. They were given 24 hours of postoperative antibiotics of  Anti-infectives (From admission, onward)    Start     Dose/Rate Route Frequency Ordered Stop   05/02/22 1730  ceFAZolin (ANCEF) IVPB 2g/100 mL premix        2 g 200 mL/hr over 30 Minutes Intravenous Every 6 hours 05/02/22 1534 05/03/22 0529   05/02/22 0930  ceFAZolin (ANCEF) IVPB 2g/100 mL premix        2 g 200 mL/hr over 30 Minutes Intravenous On call to O.R. 05/02/22 7341 05/02/22 1205      and started on DVT prophylaxis in the form of Xarelto.   PT and OT were ordered for total joint protocol. Discharge planning consulted to help with postop disposition and equipment needs. Patient had a fair night on the evening of surgery. They started to get up OOB with therapy on POD #0 but had some nausea. Worked with PT on POD #1.  Continued to work with therapy into POD #2 but was limited by pain and did not meet his goals. We received clarification from Cardio on post-op DVT ppx recommendations and altered the regimen accordingly.    Pt was seen during rounds on day three and was ready to go home pending progress with therapy. Pt worked with therapy for two additional sessions and was meeting their goals. he was discharged to home later that day in stable condition.  Diet: Regular diet Activity: WBAT Follow-up: in 2 weeks Disposition: Home Discharged Condition: good    Allergies as of 05/05/2022       Reactions   Metformin    Other  reaction(s): Other (See Comments) After starting metformin- Severe hip pain bilaterally worse on left. Could not turn neck. Back pain. Symptoms drastically improved off metformin   Metformin And Related    After starting metformin- Severe hip pain bilaterally worse on left. Could not turn neck. Back pain. Symptoms drastically improved off metformin        Medication List     STOP taking these medications    clopidogrel 75 MG tablet Commonly known as: PLAVIX   traMADol 50 MG tablet Commonly known as: ULTRAM   Tylenol 8 Hour Arthritis Pain 650 MG CR tablet Generic drug: acetaminophen Replaced by: acetaminophen 500 MG tablet       TAKE these medications    acetaminophen 500 MG tablet Commonly known as: TYLENOL Take 2 tablets (1,000 mg total) by mouth every 6 (six) hours. Replaces: Tylenol 8 Hour Arthritis Pain 650 MG CR tablet   amLODipine 2.5 MG tablet Commonly known as: NORVASC Take 1 tablet (2.5 mg total) by mouth daily.   aspirin EC 81 MG tablet Take 1 tablet (81 mg total) by mouth daily. Swallow whole. Take while on Xarelto. When Xarelto stopped, resume Plavix   carvedilol 12.5 MG tablet  Commonly known as: COREG Take 1 tablet (12.5 mg total) by mouth 2 (two) times daily.   docusate sodium 100 MG capsule Commonly known as: COLACE Take 1 capsule (100 mg total) by mouth 2 (two) times daily.   HYDROmorphone 2 MG tablet Commonly known as: DILAUDID Take 1 tablet (2 mg total) by mouth every 4 (four) hours as needed for moderate pain (pains score 4-6).   LORazepam 0.5 MG tablet Commonly known as: ATIVAN TAKE 1 TABLET BY MOUTH AT BEDTIME AS NEEDED FOR ANXIETY (DO NOT DRIVE FOR EIGHT HOURS AFTER TAKING)   methocarbamol 500 MG tablet Commonly known as: ROBAXIN Take 1 tablet (500 mg total) by mouth every 6 (six) hours as needed for muscle spasms.   nitroGLYCERIN 0.4 MG SL tablet Commonly known as: NITROSTAT Place 1 tablet (0.4 mg total) under the tongue every 5  (five) minutes as needed for chest pain.   ondansetron 4 MG tablet Commonly known as: ZOFRAN Take 1 tablet (4 mg total) by mouth every 6 (six) hours as needed for nausea.   pantoprazole 40 MG tablet Commonly known as: PROTONIX Take 1 tablet (40 mg total) by mouth daily.   polyethylene glycol 17 g packet Commonly known as: MIRALAX / GLYCOLAX Take 17 g by mouth 2 (two) times daily. To prevent constipation while on narcotics   rivaroxaban 10 MG Tabs tablet Commonly known as: XARELTO Take 1 tablet (10 mg total) by mouth daily with breakfast.   rosuvastatin 40 MG tablet Commonly known as: CRESTOR Take 1 tablet (40 mg total) by mouth daily.   triamcinolone lotion 0.1 % Commonly known as: KENALOG Apply 1 application  topically 2 (two) times daily as needed (skin irritation). Mix lotion with cream and apply twice daily to affected area        Follow-up Information     Irving Copas, PA-C. Go on 05/17/2022.   Specialty: Orthopedic Surgery Why: You are scheduled for a follow up appointment on 05-17-22 at 10:45 am. Contact information: [address] [place] [address] [phone]                 Signed: Griffith Citron, PA-C Orthopedic Surgery 05/17/2022, 8:13 AM

## 2022-05-27 ENCOUNTER — Other Ambulatory Visit: Payer: Self-pay | Admitting: Family Medicine

## 2022-06-08 DIAGNOSIS — Z471 Aftercare following joint replacement surgery: Secondary | ICD-10-CM | POA: Diagnosis not present

## 2022-06-08 DIAGNOSIS — Z96641 Presence of right artificial hip joint: Secondary | ICD-10-CM | POA: Diagnosis not present

## 2022-06-30 ENCOUNTER — Ambulatory Visit (HOSPITAL_COMMUNITY)
Admission: RE | Admit: 2022-06-30 | Discharge: 2022-06-30 | Disposition: A | Discharge status: Home / Self Care | Payer: PPO | Source: Ambulatory Visit | Attending: Vascular Surgery | Admitting: Vascular Surgery

## 2022-06-30 ENCOUNTER — Other Ambulatory Visit: Payer: Self-pay

## 2022-06-30 DIAGNOSIS — Z7902 Long term (current) use of antithrombotics/antiplatelets: Secondary | ICD-10-CM | POA: Insufficient documentation

## 2022-06-30 DIAGNOSIS — M7989 Other specified soft tissue disorders: Secondary | ICD-10-CM

## 2022-06-30 DIAGNOSIS — Z86718 Personal history of other venous thrombosis and embolism: Secondary | ICD-10-CM

## 2022-07-05 ENCOUNTER — Ambulatory Visit (INDEPENDENT_AMBULATORY_CARE_PROVIDER_SITE_OTHER): Payer: PPO

## 2022-07-05 ENCOUNTER — Encounter: Payer: Self-pay | Admitting: Family Medicine

## 2022-07-05 DIAGNOSIS — Z23 Encounter for immunization: Secondary | ICD-10-CM

## 2022-07-18 ENCOUNTER — Other Ambulatory Visit: Payer: Self-pay | Admitting: Family Medicine

## 2022-07-20 DIAGNOSIS — Z471 Aftercare following joint replacement surgery: Secondary | ICD-10-CM | POA: Diagnosis not present

## 2022-07-20 DIAGNOSIS — Z96641 Presence of right artificial hip joint: Secondary | ICD-10-CM | POA: Diagnosis not present

## 2022-07-21 ENCOUNTER — Encounter: Payer: Self-pay | Admitting: Cardiology

## 2022-07-21 ENCOUNTER — Encounter: Payer: Self-pay | Admitting: Family Medicine

## 2022-07-26 NOTE — Telephone Encounter (Signed)
FYI

## 2022-07-29 ENCOUNTER — Other Ambulatory Visit: Payer: Self-pay | Admitting: Cardiology

## 2022-08-01 ENCOUNTER — Ambulatory Visit (HOSPITAL_BASED_OUTPATIENT_CLINIC_OR_DEPARTMENT_OTHER): Payer: PPO

## 2022-08-01 ENCOUNTER — Encounter: Payer: Self-pay | Admitting: Family Medicine

## 2022-08-03 ENCOUNTER — Ambulatory Visit (HOSPITAL_BASED_OUTPATIENT_CLINIC_OR_DEPARTMENT_OTHER)
Admission: RE | Admit: 2022-08-03 | Discharge: 2022-08-03 | Disposition: A | Discharge status: Home / Self Care | Payer: PPO | Source: Ambulatory Visit | Attending: Acute Care | Admitting: Acute Care

## 2022-08-03 DIAGNOSIS — Z87891 Personal history of nicotine dependence: Secondary | ICD-10-CM | POA: Diagnosis not present

## 2022-08-07 ENCOUNTER — Other Ambulatory Visit: Payer: Self-pay | Admitting: Acute Care

## 2022-08-07 DIAGNOSIS — Z87891 Personal history of nicotine dependence: Secondary | ICD-10-CM

## 2022-08-07 DIAGNOSIS — Z122 Encounter for screening for malignant neoplasm of respiratory organs: Secondary | ICD-10-CM

## 2022-08-10 DIAGNOSIS — M5416 Radiculopathy, lumbar region: Secondary | ICD-10-CM | POA: Diagnosis not present

## 2022-08-10 DIAGNOSIS — Z96641 Presence of right artificial hip joint: Secondary | ICD-10-CM | POA: Diagnosis not present

## 2022-08-10 DIAGNOSIS — M545 Low back pain, unspecified: Secondary | ICD-10-CM | POA: Diagnosis not present

## 2022-08-13 ENCOUNTER — Emergency Department (HOSPITAL_COMMUNITY): Payer: PPO

## 2022-08-13 ENCOUNTER — Encounter (HOSPITAL_COMMUNITY): Payer: Self-pay

## 2022-08-13 ENCOUNTER — Other Ambulatory Visit: Payer: Self-pay

## 2022-08-13 ENCOUNTER — Emergency Department (HOSPITAL_COMMUNITY)
Admission: EM | Admit: 2022-08-13 | Discharge: 2022-08-14 | Disposition: A | Discharge status: Home / Self Care | Payer: PPO | Attending: Emergency Medicine | Admitting: Emergency Medicine

## 2022-08-13 DIAGNOSIS — Z7901 Long term (current) use of anticoagulants: Secondary | ICD-10-CM | POA: Diagnosis not present

## 2022-08-13 DIAGNOSIS — R072 Precordial pain: Secondary | ICD-10-CM | POA: Diagnosis not present

## 2022-08-13 DIAGNOSIS — R0789 Other chest pain: Secondary | ICD-10-CM | POA: Diagnosis not present

## 2022-08-13 DIAGNOSIS — R079 Chest pain, unspecified: Secondary | ICD-10-CM | POA: Diagnosis not present

## 2022-08-13 DIAGNOSIS — J449 Chronic obstructive pulmonary disease, unspecified: Secondary | ICD-10-CM | POA: Diagnosis not present

## 2022-08-13 DIAGNOSIS — Z79899 Other long term (current) drug therapy: Secondary | ICD-10-CM | POA: Diagnosis not present

## 2022-08-13 DIAGNOSIS — I2510 Atherosclerotic heart disease of native coronary artery without angina pectoris: Secondary | ICD-10-CM | POA: Diagnosis not present

## 2022-08-13 DIAGNOSIS — I10 Essential (primary) hypertension: Secondary | ICD-10-CM | POA: Diagnosis not present

## 2022-08-13 LAB — CBC
HCT: 38 % — ABNORMAL LOW (ref 39.0–52.0)
Hemoglobin: 12.4 g/dL — ABNORMAL LOW (ref 13.0–17.0)
MCH: 28.6 pg (ref 26.0–34.0)
MCHC: 32.6 g/dL (ref 30.0–36.0)
MCV: 87.8 fL (ref 80.0–100.0)
Platelets: 225 10*3/uL (ref 150–400)
RBC: 4.33 MIL/uL (ref 4.22–5.81)
RDW: 13.2 % (ref 11.5–15.5)
WBC: 11.3 10*3/uL — ABNORMAL HIGH (ref 4.0–10.5)
nRBC: 0 % (ref 0.0–0.2)

## 2022-08-13 LAB — BASIC METABOLIC PANEL
Anion gap: 9 (ref 5–15)
BUN: 20 mg/dL (ref 8–23)
CO2: 22 mmol/L (ref 22–32)
Calcium: 9 mg/dL (ref 8.9–10.3)
Chloride: 106 mmol/L (ref 98–111)
Creatinine, Ser: 0.8 mg/dL (ref 0.61–1.24)
GFR, Estimated: >60 mL/min (ref >60)
Glucose, Bld: 153 mg/dL — ABNORMAL HIGH (ref 70–99)
Potassium: 3.9 mmol/L (ref 3.5–5.1)
Sodium: 137 mmol/L (ref 135–145)

## 2022-08-13 LAB — TROPONIN I (HIGH SENSITIVITY): Troponin I (High Sensitivity): 6 ng/L (ref <18)

## 2022-08-13 NOTE — ED Provider Notes (Signed)
[facility] EMERGENCY DEPARTMENT Provider Note   CSN: [id number] Arrival date & time: 08/13/22  2052     History {Add pertinent medical, surgical, social history, OB history to HPI:1} Chief Complaint  Patient presents with   Chest Pain    Terry Wright is a 67 y.o. male.  The history is provided by the patient and medical records.  Chest Pain  67 year old male with history of GERD, hyperlipidemia, COPD, hypertension, peripheral vascular disease, CAD with DES to LCx 2019, presenting to the ED with chest pain.  Reports this began around lunchtime today.  Midsternal in nature without radiation.  States initially was intermittent, tried to brush it off as acid reflux.  States this evening symptoms seem to get worse and more persistent.  He denies any associated shortness of breath, diaphoresis, or nausea.  He did have elevated BP readings, improved with NTG x2 at home.  He did try to call on-call physician for his cardiology office, however never received a call back.  History of STEMI in the past with cardiac arrest.  He is followed closely by cardiology, Dr. Ellyn Hack.  By time of my evaluation, states pain has been resolved for about 20 minutes now, currently symptom free.  Home Medications Prior to Admission medications   Medication Sig Start Date End Date Taking? Authorizing Provider  acetaminophen (TYLENOL) 500 MG tablet Take 2 tablets (1,000 mg total) by mouth every 6 (six) hours. 05/03/22   Paralee Cancel, MD  amLODipine (NORVASC) 2.5 MG tablet TAKE 1 TABLET BY MOUTH EVERY DAY 05/29/22   Marin Olp, MD  carvedilol (COREG) 12.5 MG tablet Take 1 tablet (12.5 mg total) by mouth 2 (two) times daily. 10/28/21   Leonie Man, MD  docusate sodium (COLACE) 100 MG capsule Take 1 capsule (100 mg total) by mouth 2 (two) times daily. 05/03/22   Paralee Cancel, MD  HYDROmorphone (DILAUDID) 2 MG tablet Take 1 tablet (2 mg total) by mouth every 4 (four) hours as  needed for moderate pain (pains score 4-6). 05/03/22   Paralee Cancel, MD  LORazepam (ATIVAN) 0.5 MG tablet TAKE 1 TABLET BY MOUTH AT BEDTIME AS NEEDED FOR ANXIETY. DO not drive FOR EIGHT hours AFTER taking 07/18/22   Marin Olp, MD  methocarbamol (ROBAXIN) 500 MG tablet Take 1 tablet (500 mg total) by mouth every 6 (six) hours as needed for muscle spasms. 05/03/22   Paralee Cancel, MD  nitroGLYCERIN (NITROSTAT) 0.4 MG SL tablet Place 1 tablet (0.4 mg total) under the tongue every 5 (five) minutes as needed for chest pain. 10/11/20   Marin Olp, MD  ondansetron (ZOFRAN) 4 MG tablet Take 1 tablet (4 mg total) by mouth every 6 (six) hours as needed for nausea. 05/03/22   Paralee Cancel, MD  pantoprazole (PROTONIX) 40 MG tablet TAKE 1 TABLET BY MOUTH EVERY DAY 08/01/22   Leonie Man, MD  polyethylene glycol (MIRALAX / GLYCOLAX) 17 g packet Take 17 g by mouth 2 (two) times daily. To prevent constipation while on narcotics 05/03/22   Paralee Cancel, MD  rivaroxaban (XARELTO) 10 MG TABS tablet Take 1 tablet (10 mg total) by mouth daily with breakfast. 05/03/22   Paralee Cancel, MD  rosuvastatin (CRESTOR) 40 MG tablet Take 1 tablet (40 mg total) by mouth daily. 04/07/22   Marin Olp, MD  triamcinolone lotion (KENALOG) 0.1 % Apply 1 application  topically 2 (two) times daily as needed (skin irritation). Mix lotion with cream and apply  twice daily to affected area    [provider]      Allergies    Metformin and Metformin and related    Review of Systems   Review of Systems  Cardiovascular:  Positive for chest pain.  All other systems reviewed and are negative.   Physical Exam Updated Vital Signs BP (!) 151/90   Pulse 70   Temp 98.4 F (36.9 C)   Resp 17   Ht '6\' 1"'$  (1.854 m)   Wt 95.2 kg   SpO2 98%   BMI 27.70 kg/m   Physical Exam Vitals and nursing note reviewed.  Constitutional:      Appearance: He is well-developed.  HENT:     Head: Normocephalic and atraumatic.   Eyes:     Conjunctiva/sclera: Conjunctivae normal.     Pupils: Pupils are equal, round, and reactive to light.  Cardiovascular:     Rate and Rhythm: Normal rate and regular rhythm.     Heart sounds: Normal heart sounds.  Pulmonary:     Effort: Pulmonary effort is normal.     Breath sounds: Normal breath sounds.  Chest:     Comments: Chest wall non-tender Abdominal:     General: Bowel sounds are normal.     Palpations: Abdomen is soft.  Musculoskeletal:        General: Normal range of motion.     Cervical back: Normal range of motion.  Skin:    General: Skin is warm and dry.  Neurological:     Mental Status: He is alert and oriented to person, place, and time.     ED Results / Procedures / Treatments   Labs (all labs ordered are listed, but only abnormal results are displayed) Labs Reviewed  BASIC METABOLIC PANEL - Abnormal; Notable for the following components:      Result Value   Glucose, Bld 153 (*)    All other components within normal limits  CBC - Abnormal; Notable for the following components:   WBC 11.3 (*)    Hemoglobin 12.4 (*)    HCT 38.0 (*)    All other components within normal limits  TROPONIN I (HIGH SENSITIVITY)  TROPONIN I (HIGH SENSITIVITY)    EKG None  Radiology DG Chest 2 View  Result Date: 08/13/2022 CLINICAL DATA:  Chest pain EXAM: CHEST - 2 VIEW COMPARISON:  08/12/2019 FINDINGS: Heart and mediastinal contours are within normal limits. No focal opacities or effusions. No acute bony abnormality. IMPRESSION: No active cardiopulmonary disease. Electronically Signed   By: Rolm Baptise M.D.   On: 08/13/2022 22:21    Procedures Procedures  {Document cardiac monitor, telemetry assessment procedure when appropriate:1}  Medications Ordered in ED Medications - No data to display  ED Course/ Medical Decision Making/ A&P   {   Click here for ABCD2, HEART and other calculatorsREFRESH Note before signing :1}                          Medical  Decision Making Amount and/or Complexity of Data Reviewed Labs: ordered. Radiology: ordered.   ***  {Document critical care time when appropriate:1} {Document review of labs and clinical decision tools ie heart score, Chads2Vasc2 etc:1}  {Document your independent review of radiology images, and any outside records:1} {Document your discussion with family members, caretakers, and with consultants:1} {Document social determinants of health affecting pt's care:1} {Document your decision making why or why not admission, treatments were needed:1} Final Clinical Impression(s) /  ED Diagnoses Final diagnoses:  None    Rx / DC Orders ED Discharge Orders     None

## 2022-08-13 NOTE — ED Triage Notes (Signed)
Pt states sternal chest pain 1130 6/10.  Took 2 nitro 8:15 PM.  Pain decreased to 4/10 with the nitro.

## 2022-08-14 ENCOUNTER — Encounter: Payer: Self-pay | Admitting: Cardiology

## 2022-08-14 LAB — TROPONIN I (HIGH SENSITIVITY): Troponin I (High Sensitivity): 6 ng/L (ref <18)

## 2022-08-14 MED ORDER — CLOPIDOGREL BISULFATE 75 MG PO TABS: 75.0000 mg | ORAL_TABLET | Freq: Every day | ORAL | 3 refills | Status: DC

## 2022-08-14 NOTE — Telephone Encounter (Signed)
Lets see what [name] figures out.  Terry Wright

## 2022-08-14 NOTE — Telephone Encounter (Signed)
Patient stated he had to go to the ED last night because the on-call team never returned his call regarding his BP 159/84 and chest pain. He stated he used NTG 15 minutes apart to lower his BP.  While on phone, he denied chest pain or SOB. He wanted to make an appointment regarding his BP because he is concerned. Appointment made with Arnold Long for 1/19. Upon reviewing medication list wit patient, he has not been taking Xarelto for 2 years. This was removed from his med list. He is taking Plavix '75mg'$  daily per last OV May 2023. This was added to his med list.

## 2022-08-14 NOTE — Discharge Instructions (Signed)
Your cardiac work-up today was reassuring. Please follow-up with Dr. Ellyn Hack-- he will be able to see your notes/labs from today's visit. Return here for new concerns.

## 2022-08-14 NOTE — Progress Notes (Addendum)
Cardiology Office Note:    Date:  08/15/2022   ID:  Terry Wright, DOB Jun 23, 1956, MRN [id number]  PCP:  Marin Olp, MD  [facility] Cardiologist:  [name], MD     Referring MD: Marin Olp, MD   Patient Profile: Coronary artery disease  Inf STEMI 07/2016 c/b cardiac arrest s/p 4 x 16 mm DES to LCx  [place] 02/2018 s/p 2.25 x 16 mm DES to LCx Cath 03/27/18: oLAD 20, pLCx stent patent; dLCx/OM3 95 (PCI); dRCA 20, 79, EF 55 Ischemic CM EF 35-40 >>improved to 50-55 TTE 01/11/17: mild Lat HK, EF 50-55, Gr 1 DD, mild to mod LAE, mild RAE Hyperlipidemia  Hypertension  Pre-diabetes  Aortic atherosclerosis Chronic Obstructive Pulmonary Disease/Emphysema Hx of DVT in RUE x 2      History of Present Illness:   Terry Wright is a 67 y.o. male with the above problem list.  He was last seen by Dr. Ellyn Hack in May 2023. He went to the ED on 08/13/22 with chest pain and elevated BP. ED notes indicate he took NTG x 2 with improvement in his BP. He tried calling our answering service, but unfortunately did not get a call back. In the ED his hsTrop was neg (6>>6); SCr was normal, Hgb was stable, CXR was w/o acute disease. EKG personally reviewed and interpreted: NSR HR 65, no ST TW changes, QTc 426.  He returns for f/u from his visit to the ED for chest pain and elevated BP.  He is here alone.  He notes his chest discomfort started on the day he went to the emergency room shortly after lunch.  His discomfort was substernal without any associated symptoms or radiating symptoms.  It did remind him of his symptoms prior to his most recent PCI.  His discomfort would last at times for 20 to 30 minutes.  Walking around would help.  His blood pressure was significantly elevated.  He did take NTG x 2 with relief of chest pain and it did lower his BP.  He has not had recurrent symptoms since Sunday.  He did go workout yesterday.  He walked on the track at the gym  and had no chest discomfort.  He has not had any significant change in his shortness of breath.  He has not had orthopnea, syncope.  He has some residual edema in his right leg since his recent hip surgery.  He has not had pleuritic chest pain or chest pain with lying supine.  He has not noticed significant GI related symptoms.  EKG:  not done    Reviewed and updated this encounter:   Tobacco  Allergies  Meds  Problems  Med Hx  Surg Hx  Fam Hx     ROS   Labs/Other Test Reviewed:   Recent Labs: 05/08/2022: ALT 76 08/13/2022: BUN 20; Creatinine, Ser 0.80; Hemoglobin 12.4; Platelets 225; Potassium 3.9; Sodium 137   Recent Lipid Panel Recent Labs    02/21/22 1107  LDLDIRECT 69.0    Risk Assessment/Calculations/Metrics:             Physical Exam:   VS:  BP 136/80   Pulse 82   Ht '6\' 1"'$  (1.854 m)   Wt 216 lb 9.6 oz (98.2 kg)   SpO2 98%   BMI 28.58 kg/m    Wt Readings from Last 3 Encounters:  08/15/22 216 lb 9.6 oz (98.2 kg)  08/13/22 209 lb 15.8 oz (95.2 kg)  08/03/22  210 lb (95.3 kg)    Constitutional:      Appearance: Healthy appearance. Not in distress.  Neck:     Vascular: JVD normal.  Pulmonary:     Effort: Pulmonary effort is normal.     Breath sounds: No wheezing. No rales.  Cardiovascular:     Normal rate. Regular rhythm. Normal S1. Normal S2.      Murmurs: There is no murmur.  Edema:    Peripheral edema present.    Ankle: bilateral trace edema of the ankle. Abdominal:     Palpations: Abdomen is soft.         ASSESSMENT & PLAN:   Coronary artery disease involving native coronary artery of native heart with angina pectoris ([place]) History of inferior STEMI complicated by V-fib arrest in 2018 treated with a DES to the LCx.  He underwent DES to the LCx again in 2019 secondary to unstable angina.  His previous stent was patent and he did have residual 70% stenosis in the distal RCA.  As noted, he went to the emergency room recently with elevated blood  pressures and chest discomfort.  His chest discomfort waxed and waned for several hours prior to going the emergency room.  His troponins were completely negative x 2.  His chest x-ray did not demonstrate any acute changes.  He has not had recurrent symptoms since.  However, he did feel as though his symptoms reminded him of his previous angina.  He has been on prednisone for his back and is tapering off of this now.  His blood pressures do look somewhat better.  We discussed the rationale for proceeding with stress testing versus cardiac catheterization.  As his troponins were negative and he has not had recurrent symptoms since he went to the emergency room, I have recommended proceeding with stress testing initially.  He agrees with this approach.  He knows to contact us if his symptoms should change over the next several days. Increase amlodipine to 5 mg daily Continue Plavix 75 mg daily, carvedilol 12.5 mg twice daily, as needed nitroglycerin Lexiscan Myoview Follow-up 3 to 4 weeks  ADDENDUM 08/23/2022  GATED SPECT MYO PERF W/LEXISCAN STRESS 1D 08/21/2022   Findings are consistent with infarction with peri-infarct ischemia in the inferoseptal wall. The study is intermediate risk.   EF: 39 %.  Follow up echocardiogram demonstrated normal EF. Reviewed Myoview findings with Dr. Ellyn Hack. He felt there was significant ischemia on his images and recommended cardiac catheterization. I discussed with the patient risks and benefits of cardiac catheterization. With shared decision making, the patient is will to proceed. Will plan on cardiac catheterization next week.  Essential hypertension Blood pressure is improved here.  It has been elevated at home.  It is still somewhat borderline on my repeat.  Increase amlodipine to 5 mg daily.  Continue carvedilol 12.5 mg twice daily.  Dyslipidemia, goal LDL below 50 LDL 69 in July 2023.  His rosuvastatin was increased to 40 mg daily at that time.  Continue  current therapy.  Thoracic aortic atherosclerosis (HCC) Continue Plavix, statin therapy.  History of DVT (deep vein thrombosis) He has not required long term anticoagulation. Recent R LE venous duplex report reviewed. Study in Dec 2023 was neg for DVT.        Shared Decision Making/Informed Consent The risks [chest pain, shortness of breath, cardiac arrhythmias, dizziness, blood pressure fluctuations, myocardial infarction, stroke/transient ischemic attack, nausea, vomiting, allergic reaction, radiation exposure, metallic taste sensation and life-threatening complications (estimated to  be 1 in 10,000)], benefits (risk stratification, diagnosing coronary artery disease, treatment guidance) and alternatives of a nuclear stress test were discussed in detail with Terry Wright and he agrees to proceed.   Dispo:  Return in about 4 weeks (around 09/12/2022) for Follow up after testing w/ Dr. Ellyn Hack.   Signed, Richardson Dopp, PA-C  08/15/2022 8:42 AM    [facility], [place], [place]  [address] Phone: [phone]; Fax: [phone]

## 2022-08-14 NOTE — H&P (View-Only) (Signed)
Cardiology Office Note:    Date:  08/15/2022   ID:  [name], DOB [birth year], MRN [id number]  PCP:  Marin Olp, MD  [place] [facility] Cardiologist:  [name], MD     Referring MD: Marin Olp, MD   Patient Profile: Coronary artery disease  Inf STEMI 07/2016 c/b cardiac arrest s/p 4 x 16 mm DES to LCx  [place] 02/2018 s/p 2.25 x 16 mm DES to LCx Cath 03/27/18: oLAD 20, pLCx stent patent; dLCx/OM3 95 (PCI); dRCA 20, 63, EF 55 Ischemic CM EF 35-40 >>improved to 50-55 TTE 01/11/17: mild Lat HK, EF 50-55, Gr 1 DD, mild to mod LAE, mild RAE Hyperlipidemia  Hypertension  Pre-diabetes  Aortic atherosclerosis Chronic Obstructive Pulmonary Disease/Emphysema Hx of DVT in RUE x 2      History of Present Illness:   Georgios Kina is a 67 y.o. male with the above problem list.  He was last seen by Dr. Ellyn Hack in May 2023. He went to the ED on 08/13/22 with chest pain and elevated BP. ED notes indicate he took NTG x 2 with improvement in his BP. He tried calling our answering service, but unfortunately did not get a call back. In the ED his hsTrop was neg (6>>6); SCr was normal, Hgb was stable, CXR was w/o acute disease. EKG personally reviewed and interpreted: NSR HR 65, no ST TW changes, QTc 426.  He returns for f/u from his visit to the ED for chest pain and elevated BP.  He is here alone.  He notes his chest discomfort started on the day he went to the emergency room shortly after lunch.  His discomfort was substernal without any associated symptoms or radiating symptoms.  It did remind him of his symptoms prior to his most recent PCI.  His discomfort would last at times for 20 to 30 minutes.  Walking around would help.  His blood pressure was significantly elevated.  He did take NTG x 2 with relief of chest pain and it did lower his BP.  He has not had recurrent symptoms since Sunday.  He did go workout yesterday.  He walked on the track at the gym  and had no chest discomfort.  He has not had any significant change in his shortness of breath.  He has not had orthopnea, syncope.  He has some residual edema in his right leg since his recent hip surgery.  He has not had pleuritic chest pain or chest pain with lying supine.  He has not noticed significant GI related symptoms.  EKG:  not done    Reviewed and updated this encounter:   Tobacco  Allergies  Meds  Problems  Med Hx  Surg Hx  Fam Hx     ROS   Labs/Other Test Reviewed:   Recent Labs: 05/08/2022: ALT 76 08/13/2022: BUN 20; Creatinine, Ser 0.80; Hemoglobin 12.4; Platelets 225; Potassium 3.9; Sodium 137   Recent Lipid Panel Recent Labs    02/21/22 1107  LDLDIRECT 69.0    Risk Assessment/Calculations/Metrics:             Physical Exam:   VS:  BP 136/80   Pulse 82   Ht '6\' 1"'$  (1.854 m)   Wt 216 lb 9.6 oz (98.2 kg)   SpO2 98%   BMI 28.58 kg/m    Wt Readings from Last 3 Encounters:  08/15/22 216 lb 9.6 oz (98.2 kg)  08/13/22 209 lb 15.8 oz (95.2 kg)  08/03/22  210 lb (95.3 kg)    Constitutional:      Appearance: Healthy appearance. Not in distress.  Neck:     Vascular: JVD normal.  Pulmonary:     Effort: Pulmonary effort is normal.     Breath sounds: No wheezing. No rales.  Cardiovascular:     Normal rate. Regular rhythm. Normal S1. Normal S2.      Murmurs: There is no murmur.  Edema:    Peripheral edema present.    Ankle: bilateral trace edema of the ankle. Abdominal:     Palpations: Abdomen is soft.         ASSESSMENT & PLAN:   Coronary artery disease involving native coronary artery of native heart with angina pectoris ([place]) History of inferior STEMI complicated by V-fib arrest in 2018 treated with a DES to the LCx.  He underwent DES to the LCx again in 2019 secondary to unstable angina.  His previous stent was patent and he did have residual 70% stenosis in the distal RCA.  As noted, he went to the emergency room recently with elevated blood  pressures and chest discomfort.  His chest discomfort waxed and waned for several hours prior to going the emergency room.  His troponins were completely negative x 2.  His chest x-ray did not demonstrate any acute changes.  He has not had recurrent symptoms since.  However, he did feel as though his symptoms reminded him of his previous angina.  He has been on prednisone for his back and is tapering off of this now.  His blood pressures do look somewhat better.  We discussed the rationale for proceeding with stress testing versus cardiac catheterization.  As his troponins were negative and he has not had recurrent symptoms since he went to the emergency room, I have recommended proceeding with stress testing initially.  He agrees with this approach.  He knows to contact us if his symptoms should change over the next several days. Increase amlodipine to 5 mg daily Continue Plavix 75 mg daily, carvedilol 12.5 mg twice daily, as needed nitroglycerin Lexiscan Myoview Follow-up 3 to 4 weeks  ADDENDUM 08/23/2022  GATED SPECT MYO PERF W/LEXISCAN STRESS 1D 08/21/2022   Findings are consistent with infarction with peri-infarct ischemia in the inferoseptal wall. The study is intermediate risk.   EF: 39 %.  Follow up echocardiogram demonstrated normal EF. Reviewed Myoview findings with Dr. Ellyn Hack. He felt there was significant ischemia on his images and recommended cardiac catheterization. I discussed with the patient risks and benefits of cardiac catheterization. With shared decision making, the patient is will to proceed. Will plan on cardiac catheterization next week.  Essential hypertension Blood pressure is improved here.  It has been elevated at home.  It is still somewhat borderline on my repeat.  Increase amlodipine to 5 mg daily.  Continue carvedilol 12.5 mg twice daily.  Dyslipidemia, goal LDL below 50 LDL 69 in July 2023.  His rosuvastatin was increased to 40 mg daily at that time.  Continue  current therapy.  Thoracic aortic atherosclerosis (HCC) Continue Plavix, statin therapy.  History of DVT (deep vein thrombosis) He has not required long term anticoagulation. Recent R LE venous duplex report reviewed. Study in Dec 2023 was neg for DVT.        Shared Decision Making/Informed Consent The risks [chest pain, shortness of breath, cardiac arrhythmias, dizziness, blood pressure fluctuations, myocardial infarction, stroke/transient ischemic attack, nausea, vomiting, allergic reaction, radiation exposure, metallic taste sensation and life-threatening complications (estimated to  be 1 in 10,000)], benefits (risk stratification, diagnosing coronary artery disease, treatment guidance) and alternatives of a nuclear stress test were discussed in detail with Mr. Turvey and he agrees to proceed.   Dispo:  Return in about 4 weeks (around 09/12/2022) for Follow up after testing w/ Dr. Ellyn Hack.   Signed, Richardson Dopp, PA-C  08/15/2022 8:42 AM    [facility] [place], [place], [place]  [address] Phone: [phone]; Fax: [phone]

## 2022-08-15 ENCOUNTER — Encounter: Payer: Self-pay | Admitting: Physician Assistant

## 2022-08-15 ENCOUNTER — Ambulatory Visit: Payer: PPO | Attending: Adult Health | Admitting: Physician Assistant

## 2022-08-15 VITALS — BP 136/80 | HR 82 | Ht 73.0 in | Wt 216.6 lb

## 2022-08-15 DIAGNOSIS — I700 Atherosclerosis of aorta: Secondary | ICD-10-CM

## 2022-08-15 DIAGNOSIS — Z86718 Personal history of other venous thrombosis and embolism: Secondary | ICD-10-CM | POA: Diagnosis not present

## 2022-08-15 DIAGNOSIS — I25119 Atherosclerotic heart disease of native coronary artery with unspecified angina pectoris: Secondary | ICD-10-CM | POA: Diagnosis not present

## 2022-08-15 DIAGNOSIS — R072 Precordial pain: Secondary | ICD-10-CM

## 2022-08-15 DIAGNOSIS — E785 Hyperlipidemia, unspecified: Secondary | ICD-10-CM

## 2022-08-15 DIAGNOSIS — I10 Essential (primary) hypertension: Secondary | ICD-10-CM | POA: Diagnosis not present

## 2022-08-15 MED ORDER — AMLODIPINE BESYLATE 5 MG PO TABS: 5.0000 mg | ORAL_TABLET | Freq: Every day | ORAL | 3 refills | Status: DC

## 2022-08-15 NOTE — Assessment & Plan Note (Signed)
He has not required long term anticoagulation. Recent R LE venous duplex report reviewed. Study in Dec 2023 was neg for DVT.

## 2022-08-15 NOTE — Assessment & Plan Note (Signed)
Blood pressure is improved here.  It has been elevated at home.  It is still somewhat borderline on my repeat.  Increase amlodipine to 5 mg daily.  Continue carvedilol 12.5 mg twice daily.

## 2022-08-15 NOTE — Assessment & Plan Note (Signed)
Continue Plavix, statin therapy.

## 2022-08-15 NOTE — Patient Instructions (Signed)
Medication Instructions:  Your physician has recommended you make the following change in your medication:   INCREASE the Amlodipine 5 mg taking 1 tablet daily   *If you need a refill on your cardiac medications before your next appointment, please call your pharmacy*   Lab Work: None ordered  If you have labs (blood work) drawn today and your tests are completely normal, you will receive your results only by: Welcome (if you have MyChart) OR A paper copy in the mail If you have any lab test that is abnormal or we need to change your treatment, we will call you to review the results.   Testing/Procedures: Your physician has requested that you have a lexiscan myoview. For further information please visit [email or web address]. Please follow instruction sheet,  BELOW:    You are scheduled for a Myocardial Perfusion Imaging Study. Please arrive 15 minutes prior to your appointment time for registration and insurance purposes.  The test will take approximately 3 to 4 hours to complete; you may bring reading material.  If someone comes with you to your appointment, they will need to remain in the main lobby due to limited space in the testing area. **If you are pregnant or breastfeeding, please notify the nuclear lab prior to your appointment**  How to prepare for your Myocardial Perfusion Test: Do not eat or drink 3 hours prior to your test, except you may have water. Do not consume products containing caffeine (regular or decaffeinated) 12 hours prior to your test. (ex: coffee, chocolate, sodas, tea). Do bring a list of your current medications with you.  If not listed below, you may take your medications as normal. Do wear comfortable clothes (no dresses or overalls) and walking shoes, tennis shoes preferred (No heels or open toe shoes are allowed). Do NOT wear cologne, perfume, aftershave, or lotions (deodorant is allowed). If these instructions are not followed, your test will  have to be rescheduled.      Follow-Up: At [facility], you and your health needs are our priority.  As part of our continuing mission to provide you with exceptional heart care, we have created designated Provider Care Teams.  These Care Teams include your primary Cardiologist (physician) and Advanced Practice Providers (APPs -  Physician Assistants and Nurse Practitioners) who all work together to provide you with the care you need, when you need it.  We recommend signing up for the patient portal called "MyChart".  Sign up information is provided on this After Visit Summary.  MyChart is used to connect with patients for Virtual Visits (Telemedicine).  Patients are able to view lab/test results, encounter notes, upcoming appointments, etc.  Non-urgent messages can be sent to your provider as well.   To learn more about what you can do with MyChart, go to [email or web address].    Your next appointment:   3-4 month(s)  Provider:   [name], MD     Other Instructions

## 2022-08-15 NOTE — Assessment & Plan Note (Signed)
LDL 69 in July 2023.  His rosuvastatin was increased to 40 mg daily at that time.  Continue current therapy.

## 2022-08-15 NOTE — Assessment & Plan Note (Signed)
History of inferior STEMI complicated by V-fib arrest in 2018 treated with a DES to the LCx.  He underwent DES to the LCx again in 2019 secondary to unstable angina.  His previous stent was patent and he did have residual 70% stenosis in the distal RCA.  As noted, he went to the emergency room recently with elevated blood pressures and chest discomfort.  His chest discomfort waxed and waned for several hours prior to going the emergency room.  His troponins were completely negative x 2.  His chest x-ray did not demonstrate any acute changes.  He has not had recurrent symptoms since.  However, he did feel as though his symptoms reminded him of his previous angina.  He has been on prednisone for his back and is tapering off of this now.  His blood pressures do look somewhat better.  We discussed the rationale for proceeding with stress testing versus cardiac catheterization.  As his troponins were negative and he has not had recurrent symptoms since he went to the emergency room, I have recommended proceeding with stress testing initially.  He agrees with this approach.  He knows to contact us if his symptoms should change over the next several days. Increase amlodipine to 5 mg daily Continue Plavix 75 mg daily, carvedilol 12.5 mg twice daily, as needed nitroglycerin Lexiscan Myoview Follow-up 3 to 4 weeks

## 2022-08-16 ENCOUNTER — Telehealth (HOSPITAL_COMMUNITY): Payer: Self-pay | Admitting: *Deleted

## 2022-08-16 NOTE — Telephone Encounter (Signed)
Patient given detailed instructions per Myocardial Perfusion Study Information Sheet for the test on 08/21/2022 at 10:15. Patient notified to arrive 15 minutes early and that it is imperative to arrive on time for appointment to keep from having the test rescheduled.  If you need to cancel or reschedule your appointment, please call the office within 24 hours of your appointment. . Patient verbalized understanding.Terry Wright

## 2022-08-18 ENCOUNTER — Ambulatory Visit: Payer: PPO | Admitting: Adult Health

## 2022-08-21 ENCOUNTER — Ambulatory Visit (HOSPITAL_COMMUNITY): Payer: PPO | Attending: Physician Assistant

## 2022-08-21 DIAGNOSIS — I10 Essential (primary) hypertension: Secondary | ICD-10-CM

## 2022-08-21 DIAGNOSIS — I25119 Atherosclerotic heart disease of native coronary artery with unspecified angina pectoris: Secondary | ICD-10-CM

## 2022-08-21 DIAGNOSIS — R072 Precordial pain: Secondary | ICD-10-CM | POA: Diagnosis not present

## 2022-08-21 LAB — MYOCARDIAL PERFUSION IMAGING
LV dias vol: 160 mL (ref 62–150)
LV sys vol: 98 mL
Nuc Stress EF: 39 %
Peak HR: 93 {beats}/min
Rest HR: 72 {beats}/min
Rest Nuclear Isotope Dose: 10.3 mCi
SDS: 1
SRS: 3
SSS: 4
ST Depression (mm): 0 mm
Stress Nuclear Isotope Dose: 32.7 mCi
TID: 1.04

## 2022-08-21 MED ORDER — REGADENOSON 0.4 MG/5ML IV SOLN
0.4000 mg | Freq: Once | INTRAVENOUS | Status: AC
  Administered 2022-08-21: 0.4 mg via INTRAVENOUS

## 2022-08-21 MED ORDER — TECHNETIUM TC 99M TETROFOSMIN IV KIT
32.7000 | PACK | Freq: Once | INTRAVENOUS | Status: AC | PRN
  Administered 2022-08-21: 32.7 via INTRAVENOUS

## 2022-08-21 MED ORDER — TECHNETIUM TC 99M TETROFOSMIN IV KIT
10.3000 | PACK | Freq: Once | INTRAVENOUS | Status: AC | PRN
  Administered 2022-08-21: 10.3 via INTRAVENOUS

## 2022-08-22 ENCOUNTER — Telehealth: Payer: Self-pay | Admitting: *Deleted

## 2022-08-22 ENCOUNTER — Ambulatory Visit (INDEPENDENT_AMBULATORY_CARE_PROVIDER_SITE_OTHER): Payer: PPO

## 2022-08-22 DIAGNOSIS — R079 Chest pain, unspecified: Secondary | ICD-10-CM

## 2022-08-22 HISTORY — PX: TRANSTHORACIC ECHOCARDIOGRAM: SHX275

## 2022-08-22 LAB — ECHOCARDIOGRAM COMPLETE
Area-P 1/2: 2.73 cm2
MV M vel: 3.46 m/s
MV Peak grad: 47.9 mmHg
S' Lateral: 3.91 cm

## 2022-08-22 NOTE — Telephone Encounter (Signed)
-----  Message from Liliane Shi, [place] sent at 08/22/2022  9:40 AM EST ----- Stress test shows old heart attack. I will ask Dr. Ellyn Hack to review this as well. The EF is low on this test. He needs an echocardiogram to confirm EF (EF can be underestimated on a nuclear stress test). PLAN: -Arrange 2D echocardiogram (Dx: chest pain, abnormal stress test) - Can we get it before f/u appt? -Keep f/u with Dr. Ellyn Hack next week. Richardson Dopp, PA-C    08/22/2022 9:39 AM

## 2022-08-23 ENCOUNTER — Encounter: Payer: Self-pay | Admitting: Cardiology

## 2022-08-23 HISTORY — PX: NM MYOVIEW LTD: HXRAD82

## 2022-08-23 NOTE — Addendum Note (Signed)
Addended byKathlen Mody, Nicki Reaper T on: 08/23/2022 05:18 PM   Modules accepted: Orders

## 2022-08-24 ENCOUNTER — Encounter: Payer: Self-pay | Admitting: *Deleted

## 2022-08-24 ENCOUNTER — Telehealth: Payer: Self-pay | Admitting: *Deleted

## 2022-08-24 ENCOUNTER — Encounter: Payer: Self-pay | Admitting: Cardiology

## 2022-08-24 DIAGNOSIS — R079 Chest pain, unspecified: Secondary | ICD-10-CM

## 2022-08-24 NOTE — Telephone Encounter (Signed)
Pt has been scheduled for LHC, 08/30/22 with Dr. Ellyn Hack.  Pt has been made aware.  He will come in tomorrow, 08/25/22 for labs.  Per pt, will send his instructions via mychart.

## 2022-08-24 NOTE — Telephone Encounter (Signed)
-----  Message from Liliane Shi, [place] sent at 08/23/2022  5:11 PM EST ----- Regarding: FW: Can you look at his Myoview? Anderson Malta I called him and he is ok to proceed with cardiac catheterization (see Dr. Allison Quarry rec's below). Ellyn Hack is in the lab 1/31. Pt prefers next week.  Can you try to get him on [facility] schedule 1/31?  I told the pt he may need to come in for labs (right over 2 weeks). Thanks! Scott  ----- Message ----- From: Leonie Man, MD Sent: 08/23/2022   2:13 PM EST To: Liliane Shi, PA-C Subject: RE: Can you look at his Myoview?               Depends on how he feels - may be better to go ahead & cath & have the 2/2 appt as f/u.  [place] ----- Message ----- From: Sharmon Revere Sent: 08/23/2022  12:57 PM EST To: Leonie Man, MD Subject: RE: Can you look at his Myoview?               He sees you 2/2. I can try to get him set up before then. Or I could have him see you 2/2 and set it up then. Let me know if you have a preference either way. Thanks! ----- Message ----- From: Leonie Man, MD Sent: 08/22/2022   5:21 PM EST To: Liliane Shi, PA-C Subject: RE: Can you look at his Myoview?               Am in lab on Firday 1/26 - but already have 4 OP cases Next day is 1/31   [facility] ----- Message ----- From: Sharmon Revere Sent: 08/22/2022   9:29 AM EST To: Leonie Man, MD Subject: Can you look at his Myoview?                   Hi Waunita Schooner Can you take a look at his stress test that was done yesterday? I saw him for chest pain. He had gone to the ED. Troponins were neg. He had not had any recurrence when I saw him. His symptoms reminded him of his prior angina. He has a hx of inf STEMI tx w DES to LCx in 2018 and [place] in 2019 tx with a 2nd DES to LCx. He has residual 70% in the RCA by last cath in 2019. He had lat HK on his last TTE in 2018. EF is down on this nuc stress. There is inf-sept scar w peri-infarct ischemia. I will get a f/u TTE to  confirm EF. He has an appt with you next Friday. Do you think this is just c/w his prior MI? Or sign of LCx/RCA stenosis? Thanks! Scott

## 2022-08-25 ENCOUNTER — Ambulatory Visit: Payer: PPO | Attending: Cardiology

## 2022-08-25 ENCOUNTER — Other Ambulatory Visit: Payer: Self-pay | Admitting: Family Medicine

## 2022-08-25 DIAGNOSIS — R079 Chest pain, unspecified: Secondary | ICD-10-CM

## 2022-08-26 LAB — CBC
Hematocrit: 42.8 % (ref 37.5–51.0)
Hemoglobin: 14 g/dL (ref 13.0–17.7)
MCH: 28.3 pg (ref 26.6–33.0)
MCHC: 32.7 g/dL (ref 31.5–35.7)
MCV: 87 fL (ref 79–97)
Platelets: 236 10*3/uL (ref 150–450)
RBC: 4.95 x10E6/uL (ref 4.14–5.80)
RDW: 13.2 % (ref 11.6–15.4)
WBC: 10 10*3/uL (ref 3.4–10.8)

## 2022-08-26 LAB — BASIC METABOLIC PANEL
BUN/Creatinine Ratio: 22 (ref 10–24)
BUN: 18 mg/dL (ref 8–27)
CO2: 22 mmol/L (ref 20–29)
Calcium: 9.3 mg/dL (ref 8.6–10.2)
Chloride: 101 mmol/L (ref 96–106)
Creatinine, Ser: 0.82 mg/dL (ref 0.76–1.27)
Glucose: 125 mg/dL — ABNORMAL HIGH (ref 70–99)
Potassium: 4.2 mmol/L (ref 3.5–5.2)
Sodium: 139 mmol/L (ref 134–144)
eGFR: 96 mL/min/{1.73_m2} (ref >59)

## 2022-08-26 NOTE — Telephone Encounter (Signed)
Chuck--  [employer] asked me to review th Myoview Results -- I was concerned with the findings & recommended that we go ahead & take a look.   [place] called me about it as well.   Sorry for the confusion -- just looking out for your best interest & don't want to miss something important.  Look forward to seeing you in the Cath Lab on 1/31.  [place]

## 2022-08-28 ENCOUNTER — Telehealth: Payer: Self-pay | Admitting: *Deleted

## 2022-08-28 NOTE — Telephone Encounter (Addendum)
Cardiac Catheterization scheduled at [facility] for: Wednesday August 30, 2022 8:30 AM Arrival time and place: [place] Entrance A at: 6:30 AM  Nothing to eat after midnight prior to procedure, clear liquids until 5 AM day of procedure.  Medication instructions: -Usual morning medications can be taken with sips of water including aspirin 81 mg and Plavix 75 mg.   Confirmed patient has responsible adult to drive home post procedure and be with patient first 24 hours after arriving home.  Patient reports no new symptoms concerning for COVID-19 in the past 10 days.  Reviewed procedure instructions with patient.

## 2022-08-28 NOTE — Telephone Encounter (Signed)
Spoke with pt regarding his heart cath on Wednesday, January 31. Pt states that someone called earlier to review his procedure and send instructions via Mychart. Pt states that he does not have any additional questions at this time. He will send a message or call if any questions arise. Pt verbalizes understanding.

## 2022-08-29 ENCOUNTER — Encounter: Payer: PPO | Admitting: Family Medicine

## 2022-08-29 ENCOUNTER — Other Ambulatory Visit (HOSPITAL_COMMUNITY): Payer: PPO

## 2022-08-30 ENCOUNTER — Other Ambulatory Visit: Payer: Self-pay

## 2022-08-30 ENCOUNTER — Encounter (HOSPITAL_COMMUNITY): Payer: Self-pay | Admitting: Cardiology

## 2022-08-30 ENCOUNTER — Encounter (HOSPITAL_COMMUNITY)
Admission: RE | Disposition: A | Discharge status: Home / Self Care | Payer: Self-pay | Source: Home / Self Care | Attending: Cardiology

## 2022-08-30 ENCOUNTER — Ambulatory Visit (HOSPITAL_COMMUNITY)
Admission: RE | Admit: 2022-08-30 | Discharge: 2022-08-30 | Disposition: A | Discharge status: Home / Self Care | Payer: PPO | Attending: Cardiology | Admitting: Cardiology

## 2022-08-30 DIAGNOSIS — Z955 Presence of coronary angioplasty implant and graft: Secondary | ICD-10-CM | POA: Insufficient documentation

## 2022-08-30 DIAGNOSIS — J449 Chronic obstructive pulmonary disease, unspecified: Secondary | ICD-10-CM | POA: Diagnosis not present

## 2022-08-30 DIAGNOSIS — I255 Ischemic cardiomyopathy: Secondary | ICD-10-CM | POA: Insufficient documentation

## 2022-08-30 DIAGNOSIS — I252 Old myocardial infarction: Secondary | ICD-10-CM | POA: Diagnosis not present

## 2022-08-30 DIAGNOSIS — I700 Atherosclerosis of aorta: Secondary | ICD-10-CM | POA: Diagnosis not present

## 2022-08-30 DIAGNOSIS — R7303 Prediabetes: Secondary | ICD-10-CM | POA: Insufficient documentation

## 2022-08-30 DIAGNOSIS — Z79899 Other long term (current) drug therapy: Secondary | ICD-10-CM | POA: Diagnosis not present

## 2022-08-30 DIAGNOSIS — I2582 Chronic total occlusion of coronary artery: Secondary | ICD-10-CM | POA: Diagnosis not present

## 2022-08-30 DIAGNOSIS — J439 Emphysema, unspecified: Secondary | ICD-10-CM | POA: Diagnosis not present

## 2022-08-30 DIAGNOSIS — I25110 Atherosclerotic heart disease of native coronary artery with unstable angina pectoris: Secondary | ICD-10-CM | POA: Insufficient documentation

## 2022-08-30 DIAGNOSIS — Z7902 Long term (current) use of antithrombotics/antiplatelets: Secondary | ICD-10-CM | POA: Diagnosis not present

## 2022-08-30 DIAGNOSIS — Z86718 Personal history of other venous thrombosis and embolism: Secondary | ICD-10-CM | POA: Diagnosis not present

## 2022-08-30 DIAGNOSIS — I10 Essential (primary) hypertension: Secondary | ICD-10-CM | POA: Diagnosis not present

## 2022-08-30 DIAGNOSIS — E785 Hyperlipidemia, unspecified: Secondary | ICD-10-CM | POA: Insufficient documentation

## 2022-08-30 DIAGNOSIS — I25119 Atherosclerotic heart disease of native coronary artery with unspecified angina pectoris: Secondary | ICD-10-CM

## 2022-08-30 DIAGNOSIS — R9439 Abnormal result of other cardiovascular function study: Secondary | ICD-10-CM

## 2022-08-30 DIAGNOSIS — R072 Precordial pain: Secondary | ICD-10-CM

## 2022-08-30 HISTORY — PX: LEFT HEART CATH AND CORONARY ANGIOGRAPHY: CATH118249

## 2022-08-30 LAB — GLUCOSE, CAPILLARY: Glucose-Capillary: 122 mg/dL — ABNORMAL HIGH (ref 70–99)

## 2022-08-30 SURGERY — LEFT HEART CATH AND CORONARY ANGIOGRAPHY
Anesthesia: LOCAL

## 2022-08-30 MED ORDER — IOHEXOL 350 MG/ML SOLN
INTRAVENOUS | Status: DC | PRN
  Administered 2022-08-30: 50 mL

## 2022-08-30 MED ORDER — HEPARIN (PORCINE) IN NACL 1000-0.9 UT/500ML-% IV SOLN
INTRAVENOUS | Status: DC | PRN
  Administered 2022-08-30 (×2): 500 mL

## 2022-08-30 MED ORDER — ASPIRIN 81 MG PO CHEW: 81.0000 mg | CHEWABLE_TABLET | ORAL | Status: DC

## 2022-08-30 MED ORDER — FENTANYL CITRATE (PF) 100 MCG/2ML IJ SOLN
INTRAMUSCULAR | Status: DC | PRN
  Administered 2022-08-30: 25 ug via INTRAVENOUS

## 2022-08-30 MED ORDER — ONDANSETRON HCL 4 MG/2ML IJ SOLN: 4.0000 mg | Freq: Four times a day (QID) | INTRAMUSCULAR | Status: DC | PRN

## 2022-08-30 MED ORDER — MIDAZOLAM HCL 2 MG/2ML IJ SOLN
INTRAMUSCULAR | Status: DC | PRN
  Administered 2022-08-30: 2 mg via INTRAVENOUS

## 2022-08-30 MED ORDER — SODIUM CHLORIDE 0.9% FLUSH: 3.0000 mL | INTRAVENOUS | Status: DC | PRN

## 2022-08-30 MED ORDER — SODIUM CHLORIDE 0.9 % WEIGHT BASED INFUSION
3.0000 mL/kg/h | INTRAVENOUS | Status: AC
  Administered 2022-08-30: 3 mL/kg/h via INTRAVENOUS

## 2022-08-30 MED ORDER — SODIUM CHLORIDE 0.9% FLUSH: 3.0000 mL | Freq: Two times a day (BID) | INTRAVENOUS | Status: DC

## 2022-08-30 MED ORDER — HYDRALAZINE HCL 20 MG/ML IJ SOLN: 10.0000 mg | INTRAMUSCULAR | Status: DC | PRN

## 2022-08-30 MED ORDER — HEPARIN (PORCINE) IN NACL 1000-0.9 UT/500ML-% IV SOLN
INTRAVENOUS | Status: AC
  Filled 2022-08-30: qty 1000

## 2022-08-30 MED ORDER — HEPARIN SODIUM (PORCINE) 1000 UNIT/ML IJ SOLN
INTRAMUSCULAR | Status: AC
  Filled 2022-08-30: qty 10

## 2022-08-30 MED ORDER — SODIUM CHLORIDE 0.9 % IV SOLN: 250.0000 mL | INTRAVENOUS | Status: DC | PRN

## 2022-08-30 MED ORDER — SODIUM CHLORIDE 0.9 % WEIGHT BASED INFUSION: 1.0000 mL/kg/h | INTRAVENOUS | Status: DC

## 2022-08-30 MED ORDER — HEPARIN SODIUM (PORCINE) 1000 UNIT/ML IJ SOLN
INTRAMUSCULAR | Status: DC | PRN
  Administered 2022-08-30: 5000 [IU] via INTRAVENOUS

## 2022-08-30 MED ORDER — MIDAZOLAM HCL 2 MG/2ML IJ SOLN
INTRAMUSCULAR | Status: AC
  Filled 2022-08-30: qty 2

## 2022-08-30 MED ORDER — LIDOCAINE HCL (PF) 1 % IJ SOLN
INTRAMUSCULAR | Status: AC
  Filled 2022-08-30: qty 30

## 2022-08-30 MED ORDER — LIDOCAINE HCL (PF) 1 % IJ SOLN
INTRAMUSCULAR | Status: DC | PRN
  Administered 2022-08-30: 2 mL

## 2022-08-30 MED ORDER — FENTANYL CITRATE (PF) 100 MCG/2ML IJ SOLN
INTRAMUSCULAR | Status: AC
  Filled 2022-08-30: qty 2

## 2022-08-30 MED ORDER — VERAPAMIL HCL 2.5 MG/ML IV SOLN
INTRAVENOUS | Status: DC | PRN
  Administered 2022-08-30: 10 mL via INTRA_ARTERIAL

## 2022-08-30 MED ORDER — ACETAMINOPHEN 325 MG PO TABS: 650.0000 mg | ORAL_TABLET | ORAL | Status: DC | PRN

## 2022-08-30 MED ORDER — LABETALOL HCL 5 MG/ML IV SOLN: 10.0000 mg | INTRAVENOUS | Status: DC | PRN

## 2022-08-30 MED ORDER — RANOLAZINE ER 500 MG PO TB12: 500.0000 mg | ORAL_TABLET | Freq: Two times a day (BID) | ORAL | 3 refills | Status: DC

## 2022-08-30 MED ORDER — VERAPAMIL HCL 2.5 MG/ML IV SOLN
INTRAVENOUS | Status: AC
  Filled 2022-08-30: qty 2

## 2022-08-30 MED ORDER — AMLODIPINE BESYLATE 5 MG PO TABS: 10.0000 mg | ORAL_TABLET | Freq: Every day | ORAL | 3 refills | Status: DC

## 2022-08-30 SURGICAL SUPPLY — 10 items
CATH 5FR JL3.5 JR4 ANG PIG MP (CATHETERS) IMPLANT
DEVICE RAD COMP TR BAND LRG (VASCULAR PRODUCTS) IMPLANT
GLIDESHEATH SLEND SS 6F .021 (SHEATH) IMPLANT
GUIDEWIRE INQWIRE 1.5J.035X260 (WIRE) IMPLANT
INQWIRE 1.5J .035X260CM (WIRE) ×1
KIT HEART LEFT (KITS) ×2 IMPLANT
PACK CARDIAC CATHETERIZATION (CUSTOM PROCEDURE TRAY) ×2 IMPLANT
SHEATH PROBE COVER 6X72 (BAG) IMPLANT
TRANSDUCER W/STOPCOCK (MISCELLANEOUS) ×2 IMPLANT
TUBING CIL FLEX 10 FLL-RA (TUBING) ×2 IMPLANT

## 2022-08-30 NOTE — Interval H&P Note (Signed)
History and Physical Interval Note:  08/30/2022 8:33 AM  Terry Wright  has presented today for surgery, with the diagnosis of abnormal stress test.  The various methods of treatment have been discussed with the patient and family. After consideration of risks, benefits and other options for treatment, the patient has consented to  Procedure(s): LEFT HEART CATH AND CORONARY ANGIOGRAPHY (N/A) PERCUTANEOUS CORONARY INTERVENTION   as a surgical intervention.  The patient's history has been reviewed, patient examined, no change in status, stable for surgery.  I have reviewed the patient's chart and labs.  Questions were answered to the patient's satisfaction.    Cath Lab Visit (complete for each Cath Lab visit)  Clinical Evaluation Leading to the Procedure:   ACS: No.  Non-ACS:    Anginal Classification: CCS III  Anti-ischemic medical therapy: Maximal Therapy (2 or more classes of medications)  Non-Invasive Test Results: Intermediate-risk stress test findings: cardiac mortality 1-3%/year  Prior CABG: No previous CABG    Terry Wright

## 2022-08-30 NOTE — Progress Notes (Signed)
TR BAND REMOVAL  LOCATION:    right radial  DEFLATED PER PROTOCOL:    Yes.    TIME BAND OFF / DRESSING APPLIED: 08/30/22 at 1120   SITE UPON ARRIVAL:    Level 0  SITE AFTER BAND REMOVAL:    Level 0  CIRCULATION SENSATION AND MOVEMENT:    Within Normal Limits   Yes.    COMMENTS:

## 2022-08-31 ENCOUNTER — Encounter: Payer: Self-pay | Admitting: Cardiology

## 2022-08-31 NOTE — Progress Notes (Signed)
Primary Care Provider: Marin Olp, [place] Cardiologist: [name], MD Electrophysiologist: None  Clinic Note: Chief Complaint  Patient presents with   Hospitalization Follow-up    Post-cath follow-up.  No problems since.   Coronary Artery Disease    Films reviewed and options discussed.    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  He and his wife are quite emotional today when we brought this up.  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  He actually looks great.  Interestingly the stent placed at that time as well as the second stent placed.  Preserved EF with no CHF symptoms for.  Being evaluated with symptom management of RCA CTO to determine medical management versus PCI.      Relevant Orders   LONG TERM MONITOR (3-14 DAYS)   Coronary artery disease involving native coronary artery of native heart with angina pectoris ([place]) - Primary (Chronic)    Terry Wright is well aware of his anginal symptoms and I am quite certain that episode he had leading to the visit with Richardson Dopp was consistent with angina.  Thankfully, he is relatively asymptomatic at this point.  We spent quite a bit of time reviewing his Films, and discussed potential CTO versus medical management.  There is a little bit of a sense of urgency and deciding what to do because at this current time, RCA PCI will be more favorable since there is still long segment of the RCA that is still subtotally occluded. The RCA being occluded is probably related to the distal RCA bifurcation lesion seen during his PCI in 2019.  Likely had progression of disease.  The plan is to continue Ranexa which was started post cath along  with his amlodipine and carvedilol for antianginal benefit.  I will have him follow-up with Coletta Memos, NP in roughly 3 to 4 weeks to reassess symptoms.  If he is symptom-free, then the plan will be to monitor and treat medically.  However, if he has had more chest pain symptoms with rest or exertion, or is having notable exertional dyspnea, I think referral to Dr. Martinique to discuss CTO PCI would be warranted.  Plan: For now continue amlodipine 5 mg daily along with carvedilol 12.5 mg twice daily Because of PVCs noted prominently during cardiac cath, we will check a 7-day Zio patch to assess PVC burden.  We may need to switch from carvedilol to Toprol to allow for more beta-blockade without lowering his blood pressure too much.  This would allow [place] to then titrate amlodipine further if necessary. Currently his heart rate is 56 on this dose of carvedilol which would make it hard to push this dose any further. Continue current dose of rosuvastatin and follow labs closely.  He should be due to have labs probably in the next month or so with his PCP.  If not, they can be drawn when he sees Coletta Memos, NP. For now we will continue long-term clopidogrel,  essentially lifelong, but okay to hold for procedures or surgeries 5 to 7 days preop.      Relevant Orders   LONG TERM MONITOR (3-14 DAYS)   Ventricular bigeminy (Chronic)    Bigeminy does not  seem to be a new thing for him, but it is related to his ischemia.  7-day Zio patch monitor to assess burden.      Relevant Orders   LONG TERM MONITOR (3-14 DAYS)   Essential hypertension (Chronic)    Stable BP.  Please be tolerating the increased dose of amlodipine.  If he does have a high PVC burden, we may want to adjust his beta-blocker to a more rhythm related beta-blocker as opposed to blood pressure related beta-blocker.  In that case, we may need to titrate amlodipine further      Dyslipidemia, goal LDL below 50 (Chronic)    He is now back  on 40 mg rosuvastatin.  Hopefully we will see an improvement in his LDL having recently gone up to 69 by most recent check.  He should be due for labs to be checked by PCP probably in March timeframe.      Frequent PVCs    Multiple PVCs noted during his heart catheterization, and heart on exam.  I would like to see his PVC burden as this could be a marker of ischemia.  Would like to keep his PVC burden down which may mean adjusting his beta-blocker dose.  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 Yong Channel.  Last A1c was 6.3 which is good that he is now low diabetes range on no actual medication.  He has made significant lifestyle changes having lost weight and change his diet.  With his severe CAD, close glycemic control is necessary.       ===================================  HPI:    Terry Wright "Terry Wright" is a 67 y.o. male with a PMH below who presents today for Post-Cath f/u.  He is here at the request of Marin Olp, MD.  Cardiac History: CRFs: HTN, HLD, Pre-DM, former smoker CAD: Inferior-posterior STEMI/VT arrest 08/06/2016 --> Shock x 2 for VT - ROSC -> CATH - 100% pCx - DES PCI (Synergy DES 4.0 x 16 - 4.6 mm)   Initial EF by LV gram was 35-40% increase to 40 and 45% by echo 2-D echo 01/11/2017: EF 50-55%. GR 1 DD. Mild to moderate LA dilation. Unstable Angina - Cath 03/27/2018: patent pCx stent --> Cx-OM2 95% ([place]) -- DES PCI. ~70% dRCA trifurcation (med Rx). Abn EKG Jan 2024 -> Cath, reviewed below H/o SVT (RUE x 2) COPD/Emphyema on CT  I last saw on Dec 05, 2021.  He was doing well.  He had a brief costochondritis back in December 2022 but was otherwise doing fine.  Bothered by hip pain-had recovered from that hip surgery pending right hip surgery. => No changes  Recent Hospitalizations:  Right hip arthroplasty 05/02/2022 [facility]  on ER 08/13/2022-presented with chest pain  He was just seen on on January 17 Richardson Dopp, [place] on August 15, 2022 following an ER visit on January 14 for chest pain and elevated pressure somewhat relieved with nitroglycerin.  Ruled out for MI.  Cardiac chest pain/discomfort the day prior to going to the ER and started show after lunch, was intermittent.  Described as substernal with some radiation.  Similar to prior anginal equivalent.  Episodes lasted 20 to 30 minutes, often alleviated with walking.  BP was notably high.  Take NTG x 2 with lower BP and relieve pain.  No significant change dyspnea.  No  orthopnea, PND or syncope. => Myoview Stress Test as well as Echocardiogram ordered.  Based on results of Myoview -> referred for cardiac catheterization.  Reviewed  CV studies:    The following studies were reviewed today: (if available, images/films reviewed: From Epic Chart or Care Everywhere) Myoview 08/23/2022: INTERMEDIATE RISK.  EF~39%.  Infero-septal wall hypokinesis.  No ST segment changes.  Partially veritable medium sized, moderate severity defect in the mid to basal inferoseptal wall.  Test appeared to have notable ischemia in the inferior lateral wall.  Based on the size and distribution #1 was seen circumflex system.  Referred for cardiac catheterization Echo 08/22/22: Normal LV Size &Fxn - EF ~ 55-65%. No RWMA. Mild Conc LVH with Severe LA dilation (but ? Normal Diastolic Fxn). Normal RV size & Fxn with normal RVP & RAP. Normal MV & AoV.  Cardiac Cath 08/30/2022: pLAD 55%, m-d LAD 45%. pRCA-dRCA 95% (SubTO) -> dRCA 100% CTO prior to Bifurcation w/ 90-95% into PDA & PAV - both fill via L-R collaterals. pCx DES & LPAV stent patent.  No AS. LVEDP 16 mmHg.  Frequent PVCs w/ bigeminy  .Dominance: Right Images were reviewed with Dr. Peter Martinique who felt that this would be potentially suitable for CTO PCI, but if this was an option it would be felt that should be sooner rather than later to avoid  potential full occlusion of the long tapered segment of the RCA.   Interval History:   Beniah Magnan returns here today for post cath follow-up to discuss titration of antihypertensive therapy, as well as possible CTO PCI of the RCA in the future.  Terry Wright returns here today accompanied by his wife.  He has been doing fine since his heart catheterization, in fact when he initially thought his stress test was normal, he had been going to the gym and actually worked out 2 days doing a full workout and had not had any recurrent episodes of chest pain.  He really only had that 1 episode on the day leading up to his visit with Richardson Dopp, PA.  He really has not noticed exertional dyspnea does not seem all that symptomatic.  However, he has not been out doing much exercise since his cath.   He could not recall a lot of what we talked about in the Cath Lab, but his wife had a better understanding.  We reviewed the cath films together and multiple questions were asked and answered.  The understanding was that with him not being symptomatic, that perhaps PCI would not be the best option.  Currently he seems to be relatively asymptomatic.  Following the cath, I started him on Ranexa 500 mg twice daily and consider the possibility of switching from carvedilol to Toprol which would allow me to titrate up beta-blocker dose because of frequent PVCs in the Cath Lab.  However I did not make a change to losartan here today.  He himself only notes the palpitations or irregular heartbeats when he is anxious or stressed.  He a little emotional talking about his initial presentation with cardiac arrest, and when that happened he felt some ectopy.  Otherwise he does not notice them.   CV Review of Symptoms (Summary): Since Cardiac Cath: no chest pain or dyspnea on exertion positive for - palpitations and anxiety.  The palpitations actually are worse when he is anxious or stressed.  He is somewhat concerned  about the Findings and was happy to have this visit to discuss results. negative for -  edema, orthopnea, paroxysmal nocturnal dyspnea, rapid heart rate, shortness of breath, or No syncope or near syncope, no TIA or amaurosis fugax. Pertinent to this is that he has not been active since his cardiac catheterization, and has only worked out twice since the abnormal stress test.  REVIEWED OF SYSTEMS   Review of Systems  Constitutional:  Negative for malaise/fatigue and weight loss.  HENT:  Negative for congestion and nosebleeds.   Respiratory:  Negative for shortness of breath.   Gastrointestinal:  Negative for abdominal pain, blood in stool and melena.  Genitourinary:  Negative for hematuria.  Musculoskeletal:  Negative for joint pain (His hip pain is notably improved.  He was really enjoying getting back into the exercise prior to his stress test.).  Neurological:  Negative for dizziness and weakness.  Psychiatric/Behavioral:  Negative for depression and memory loss. The patient is nervous/anxious. The patient does not have insomnia.    I have reviewed and (if needed) personally updated the patient's problem list, medications, allergies, past medical and surgical history, social and family history.   PAST MEDICAL HISTORY   Past Medical History:  Diagnosis Date   Anginal pain ([place]) 2018   Anxiety    Arthritis    shoulders hips,    CAD S/P percutaneous coronary angioplasty 07/2016   a) 07/2016: 100% pCx -- DES PCI with 4.0 x 16 mm Synergy DES. 65% ostial OM 3 (relatively small caliber - Med Rx);; b) 03/27/18 PCI/DES to dLCX/OM3 x1. Patent pCX DES. 70% dRCA trifurcation lesion (Med Rx). Normal EF;; 07/2022: Patent Cx/LPAV Stents patent. ~mRCA 95%--> dRCA 100% (prior 90% bifurcation lesion). pLAD 55%, m-dLAD 45%. L-R Collaterals fill PAVPL & PDA.   COPD (chronic obstructive pulmonary disease) ([place]) 2018   mild   DVT (deep venous thrombosis) (HCC)    Right upper arm DVT on 2 occasions in the  remote past   Dysrhythmia    V tac   Ejection fraction    LV function normal, echo, February, 2010   GERD (gastroesophageal reflux disease)    Hyperlipidemia    Hypertension    Mitral regurgitation    Mild, echo, February, 2010   Palpitations    Mild in the past   Peripheral vascular disease ([place]) 1980s   DVT   Pre-diabetes    Statin intolerance    Felt poorly after Lipitor, Crestor, TriCor   STEMI involving left circumflex coronary artery ([place]) 08/04/2016   Occluded very large caliber codominant circumflex - PCI with single Synergy DES   Tobacco abuse     PAST SURGICAL HISTORY   Past Surgical History:  Procedure Laterality Date   CARDIAC CATHETERIZATION N/A 08/04/2016   Procedure: Left Heart Cath and Coronary Angiography;  Surgeon: Leonie Man, MD;  Location: [facility];  Service: Cardiovascular;  Laterality: N/A: 100% pCx --> PCI. residual 65% pOM3 (small).  ~EF 35-45%  with lateral HK.   CARDIAC CATHETERIZATION N/A 08/04/2016   Procedure: CORONARY STENT INTERVENTION;  Surgeon: Leonie Man, MD;  Location: [facility];  Service: Cardiovascular;  Laterality: N/A: pCx 100%-0%: Synergy DES 4.0 x 16 (4.6 mm)   CHONDROPLASTY Right 04/04/2017   Procedure: CHONDROPLASTY;  Surgeon: Dorna Leitz, MD;  Location: [facility];  Service: Orthopedics;  Laterality: Right;   CORONARY STENT INTERVENTION N/A 03/27/2018   Procedure: CORONARY STENT INTERVENTION;  Surgeon: Martinique, Peter M, MD;  Location: [facility];  Service: Cardiovascular:: dCx-OM3 95%-0%: DES PCI - Synergy 2.25 x 16 mm.  GANGLION CYST EXCISION     oct 2017   HERNIA REPAIR     umbilical hernia 2017 oct   KNEE ARTHROSCOPY Right 04/04/2017   Procedure: ARTHROSCOPY RIGHT KNEE, WITH MEIDAL FEMORAL CONDYLE PATELLAR MEDIAL FEMORAL JOINT PLICA EXCISION ;  Surgeon: Dorna Leitz, MD;  Location: [facility];  Service: Orthopedics;  Laterality: Right;   LEFT HEART CATH AND CORONARY ANGIOGRAPHY N/A 03/27/2018    Procedure: LEFT HEART CATH AND CORONARY ANGIOGRAPHY;  Surgeon: Martinique, Peter M, MD;  Location: [facility];  Service: Cardiovascular:   2 vessel obstructive CAD: - 95% dLCx/OM3 --Successful PCI of the dLCx/OM3 with SYNERGY DES 2.25X16, -70% distal RCA at trifurcation (Med Management).  Widely patent pLCx DES. Low normal LV function (50-55%), Normal LVEDP    LEFT HEART CATH AND CORONARY ANGIOGRAPHY N/A 08/30/2022   Procedure: LEFT HEART CATH AND CORONARY ANGIOGRAPHY;  Surgeon: Leonie Man, MD;  Location: [facility];  Service: CV. ::pLAD 55%, m-d LAD 45%. pRCA-dRCA 95% (SubTO) -> dRCA 100% CTO prior to Bifurcation w/ 90-95% into PDA & PAV - both fill via L-R collaterals. pCx DES & LPAV stent patent.  No AS. LVEDP 16 mmHg.   NM MYOVIEW LTD  08/23/2022   INTERMEDIATE RISK.  EF~39%.  Infero-septal wall hypokinesis.  No ST segment changes.  Partially veritable medium sized, moderate severity defect in the mid to basal inferoseptal wall.; Considered Infarct w/ Peri-Infarct Ischemia. => Referred for Cath.   TOTAL HIP ARTHROPLASTY Left 04/08/2020   Procedure: TOTAL HIP ARTHROPLASTY ANTERIOR APPROACH;  Surgeon: Paralee Cancel, MD;  Location: [facility];  Service: Orthopedics;  Laterality: Left;  70 mins   TOTAL HIP ARTHROPLASTY Right 05/02/2022   Procedure: TOTAL HIP ARTHROPLASTY ANTERIOR APPROACH;  Surgeon: Paralee Cancel, MD;  Location: [facility];  Service: Orthopedics;  Laterality: Right;  90   TRANSTHORACIC ECHOCARDIOGRAM  08/22/2022   Normal LV Size &Fxn - EF ~ 55-65%. No RWMA. Mild Conc LVH with Severe LA dilation (but ? Normal Diastolic Fxn). Normal RV size & Fxn with normal RVP & RAP. Normal MV & AoV.   TRANSTHORACIC ECHOCARDIOGRAM  01/11/2017   a) a) 07/2016: post STEMI: EF 40-45%. Mild concentric LVH. Severe HK of basal-mid inferolateral wall consistent with infarct in this distribution. GR 1 DD.;; b) 12/2016: EF 50-55%. GR 1 DD. Mild to moderate LA dilation.    Immunization History   Administered Date(s) Administered   Fluad Quad(high Dose 65+) 04/05/2021, 07/05/2022   Influenza,inj,Quad PF,6+ Mos 04/28/2019   Influenza-Unspecified 05/17/2016, 05/02/2017, 04/15/2018, 04/28/2019, 05/07/2020   PFIZER(Purple Top)SARS-COV-2 Vaccination 08/19/2019, 09/09/2019, 05/10/2020   PNEUMOCOCCAL CONJUGATE-20 08/24/2021   Tdap 10/28/2015   Zoster Recombinat (Shingrix) 09/17/2017, 12/23/2017    MEDICATIONS/ALLERGIES   Current Meds  Medication Sig   acetaminophen (TYLENOL) 650 MG CR tablet Take 650-1,300 mg by mouth every 8 (eight) hours as needed for pain.   amLODipine (NORVASC) 5 MG tablet Take 2 tablets (10 mg total) by mouth daily.   carvedilol (COREG) 12.5 MG tablet Take 1 tablet (12.5 mg total) by mouth 2 (two) times daily.   clopidogrel (PLAVIX) 75 MG tablet Take 1 tablet (75 mg total) by mouth daily.   LORazepam (ATIVAN) 0.5 MG tablet TAKE 1 TABLET BY MOUTH AT BEDTIME AS NEEDED FOR ANXIETY. DO not drive FOR EIGHT hours AFTER taking (Patient taking differently: Take 0.5 mg by mouth at bedtime.)   nitroGLYCERIN (NITROSTAT) 0.4 MG SL tablet place 1 TABLET UNDER THE TONGUE EVERY 5 MINUTES AS NEEDED FOR  CHEST PAIN   pantoprazole (PROTONIX) 40 MG tablet TAKE 1 TABLET BY MOUTH EVERY DAY   ranolazine (RANEXA) 500 MG 12 hr tablet Take 1 tablet (500 mg total) by mouth 2 (two) times daily.   rosuvastatin (CRESTOR) 40 MG tablet Take 1 tablet (40 mg total) by mouth daily.   triamcinolone lotion (KENALOG) 0.1 % Apply 1 application  topically 2 (two) times daily as needed (skin irritation). Mix lotion with cream and apply twice daily to affected area    Allergies  Allergen Reactions   Metformin     After starting metformin- Severe hip pain bilaterally worse on left. Could not turn neck. Back pain. Symptoms drastically improved off metformin    SOCIAL HISTORY/FAMILY HISTORY   Reviewed in Epic:  Pertinent findings:  Social History   Tobacco Use   Smoking status: Former     Packs/day: 1.00    Years: 43.00    Total pack years: 43.00    Types: Cigarettes    Quit date: 2015    Years since quitting: 9.0   Smokeless tobacco: Never   Tobacco comments:    Former smoker quit 2015  Vaping Use   Vaping Use: Never used  Substance Use Topics   Alcohol use: Yes    Alcohol/week: 7.0 standard drinks of alcohol    Types: 7 Glasses of wine per week    Comment: occasional   Drug use: No   Social History   Social History Narrative   Married 10 years in 2018. 2 step kids- 85 and 51. 3 kids but he does not have contact with. No grandkids      Retired Theatre stage manager.    Wife working [place]      Hobbies: golf (needs shoulder replacement though, working in the yard, working out- was doing this regularly even before MI- walking/treadmill    OBJCTIVE -PE, EKG, labs   Wt Readings from Last 3 Encounters:  09/01/22 217 lb (98.4 kg)  08/30/22 210 lb (95.3 kg)  08/21/22 216 lb (98 kg)    Physical Exam: BP 122/66   Pulse (!) 56   Ht '6\' 1"'$  (1.854 m)   Wt 217 lb (98.4 kg)   SpO2 97%   BMI 28.63 kg/m  Physical Exam Vitals reviewed.  Constitutional:      General: He is not in acute distress.    Appearance: Normal appearance. He is not ill-appearing (Healthy-appearing.  Well-groomed.) or toxic-appearing.  HENT:     Head: Normocephalic and atraumatic.  Neck:     Vascular: No carotid bruit or JVD.  Cardiovascular:     Rate and Rhythm: Normal rate and regular rhythm. Occasional Extrasystoles are present.    Chest Wall: PMI is not displaced.     Pulses: Normal pulses. No decreased pulses.     Heart sounds: Normal heart sounds, S1 normal and S2 normal. No murmur heard.    No friction rub. No gallop.  Pulmonary:     Effort: Pulmonary effort is normal. No respiratory distress.     Breath sounds: Normal breath sounds. No wheezing, rhonchi or rales.  Musculoskeletal:        General: No swelling or deformity.     Cervical back: Normal range of motion and neck  supple.     Comments: Radial cath site clean dry intact.  No notable hematoma-just some bruising.  Skin:    General: Skin is warm and dry.     Coloration: Skin is not pale.  Neurological:  General: No focal deficit present.     Mental Status: He is alert and oriented to person, place, and time.     Gait: Gait normal.  Psychiatric:        Mood and Affect: Mood normal.        Behavior: Behavior normal.        Thought Content: Thought content normal.        Judgment: Judgment normal.     Adult ECG Report Not checked  Recent Labs: Reviewed.  He anticipates having labs checked at his next PCP. Lab Results  Component Value Date   CHOL 136 04/19/2021   HDL 41.50 04/19/2021   LDLCALC 66 04/19/2021   LDLDIRECT 69.0 02/21/2022   TRIG 143.0 04/19/2021   CHOLHDL 3 04/19/2021   Lab Results  Component Value Date   CREATININE 0.82 08/25/2022   BUN 18 08/25/2022   NA 139 08/25/2022   K 4.2 08/25/2022   CL 101 08/25/2022   CO2 22 08/25/2022      Latest Ref Rng & Units 08/25/2022   12:34 PM 08/13/2022    9:52 PM 05/08/2022    8:13 PM  CBC  WBC 3.4 - 10.8 x10E3/uL 10.0  11.3  11.6   Hemoglobin 13.0 - 17.7 g/dL 14.0  12.4  14.1   Hematocrit 37.5 - 51.0 % 42.8  38.0  42.6   Platelets 150 - 450 x10E3/uL 236  225  257     Lab Results  Component Value Date   HGBA1C 6.3 (H) 04/20/2022   No results found for: "TSH"  ================================================== I spent a total of 50 minutes with the patient spent in direct patient consultation.  We reviewed his Films both old and.  We reviewed the images and the films themselves.  We talked about various options of treatment.  We talked about medications and symptoms to be concerned with.  I spent a lot of time reassuring him and answering multiple questions from his wife. Additional time spent with chart review  / charting (studies, outside notes, etc): 26 min Total Time: 76 min  Current medicines are reviewed at length with  the patient today.  (+/- concerns) N/A  Notice: This dictation was prepared with Dragon dictation along with smart phrase technology. Any transcriptional errors that result from this process are unintentional and may not be corrected upon review.  Studies Ordered:   Orders Placed This Encounter  Procedures   LONG TERM MONITOR (3-14 DAYS)   No orders of the defined types were placed in this encounter.   Patient Instructions / Medication Changes & Studies & Tests Ordered   Patient Instructions  Medication Instructions:  No changes   *If you need a refill on your cardiac medications before your next appointment, please call your pharmacy*   Lab Work: Not needed    Testing/Procedures:  Your physician has recommended that you wear a holter monitor 7 days Zio . Holter monitors are medical devices that record the heart's electrical activity. Doctors most often use these monitors to diagnose arrhythmias. Arrhythmias are problems with the speed or rhythm of the heartbeat. The monitor is a small, portable device. You can wear one while you do your normal daily activities. This is usually used to diagnose what is causing palpitations/syncope (passing out).   Follow-Up: At [facility], you and your health needs are our priority.  As part of our continuing mission to provide you with exceptional heart care, we have created designated Provider Care Teams.  These  Care Teams include your primary Cardiologist (physician) and Advanced Practice Providers (APPs -  Physician Assistants and Nurse Practitioners) who all work together to provide you with the care you need, when you need it.     Your next appointment:    3 to 4 week(s)  The format for your next appointment:   In Person  Provider:     With  Coletta Memos NP and  [name], MD in June or July  2024   Other Instructions  ZIO XT- Long Term Monitor Instructions  Your physician has requested you wear a ZIO patch monitor for  7 days.  This is a single patch monitor. [employer] supplies one patch monitor per enrollment. Additional stickers are not available. Please do not apply patch if you will be having a Nuclear Stress Test,  Echocardiogram, Cardiac CT, MRI, or Chest Xray during the period you would be wearing the  monitor. The patch cannot be worn during these tests. You cannot remove and re-apply the  ZIO XT patch monitor.  Your ZIO patch monitor will be mailed 3 day USPS to your address on file. It may take 3-5 days  to receive your monitor after you have been enrolled.  Once you have received your monitor, please review the enclosed instructions. Your monitor  has already been registered assigning a specific monitor serial # to you.  Billing and Patient Assistance Program Information  We have supplied [employer] with any of your insurance information on file for billing purposes. [employer] offers a sliding scale Patient Assistance Program for patients that do not have  insurance, or whose insurance does not completely cover the cost of the ZIO monitor.  You must apply for the Patient Assistance Program to qualify for this discounted rate.  To apply, please call [employer] at [phone], select option 4, select option 2, ask to apply for  Patient Assistance Program. Theodore Demark will ask your household income, and how many people  are in your household. They will quote your out-of-pocket cost based on that information.  [employer] will also be able to set up a 3-month interest-free payment plan if needed.  Applying the monitor   Shave hair from upper left chest.  Hold abrader disc by orange tab. Rub abrader in 40 strokes over the upper left chest as  indicated in your monitor instructions.  Clean area with 4 enclosed alcohol pads. Let dry.  Apply patch as indicated in monitor instructions. Patch will be placed under collarbone on left  side of chest with arrow pointing upward.  Rub patch adhesive wings for 2 minutes.  Remove white label marked "1". Remove the white  label marked "2". Rub patch adhesive wings for 2 additional minutes.  While looking in a mirror, press and release button in center of patch. A small green light will  flash 3-4 times. This will be your only indicator that the monitor has been turned on.  Do not shower for the first 24 hours. You may shower after the first 24 hours.  Press the button if you feel a symptom. You will hear a small click. Record Date, Time and  Symptom in the Patient Logbook.  When you are ready to remove the patch, follow instructions on the last 2 pages of Patient  Logbook. Stick patch monitor onto the last page of Patient Logbook.  Place Patient Logbook in the blue and white box. Use locking tab on box and tape box closed  securely. The blue and white box has  prepaid postage on it. Please place it in the mailbox as  soon as possible. Your physician should have your test results approximately 7 days after the  monitor has been mailed back to [facility].  Call [facility] at [phone] if you have questions regarding  your ZIO XT patch monitor. Call them immediately if you see an orange light blinking on your  monitor.  If your monitor falls off in less than 4 days, contact our Monitor department at [phone].  If your monitor becomes loose or falls off after 4 days call [employer] at [phone] for  suggestions on securing your monitor      Leonie Man, MD, MS [name], M.D., M.S. Interventional Cardiologist  [place]  Pager # [phone] Phone # [phone] [address]. [place], [place] [address]   Thank you for choosing [facility]!!

## 2022-09-01 ENCOUNTER — Ambulatory Visit: Payer: PPO | Attending: Cardiology | Admitting: Cardiology

## 2022-09-01 ENCOUNTER — Encounter: Payer: Self-pay | Admitting: Cardiology

## 2022-09-01 ENCOUNTER — Ambulatory Visit: Payer: PPO | Attending: Cardiology

## 2022-09-01 VITALS — BP 122/66 | HR 56 | Ht 73.0 in | Wt 217.0 lb

## 2022-09-01 DIAGNOSIS — I498 Other specified cardiac arrhythmias: Secondary | ICD-10-CM

## 2022-09-01 DIAGNOSIS — I10 Essential (primary) hypertension: Secondary | ICD-10-CM | POA: Diagnosis not present

## 2022-09-01 DIAGNOSIS — I25119 Atherosclerotic heart disease of native coronary artery with unspecified angina pectoris: Secondary | ICD-10-CM | POA: Diagnosis not present

## 2022-09-01 DIAGNOSIS — E785 Hyperlipidemia, unspecified: Secondary | ICD-10-CM | POA: Diagnosis not present

## 2022-09-01 DIAGNOSIS — I2121 ST elevation (STEMI) myocardial infarction involving left circumflex coronary artery: Secondary | ICD-10-CM

## 2022-09-01 DIAGNOSIS — I493 Ventricular premature depolarization: Secondary | ICD-10-CM

## 2022-09-01 DIAGNOSIS — E1159 Type 2 diabetes mellitus with other circulatory complications: Secondary | ICD-10-CM

## 2022-09-01 NOTE — Patient Instructions (Addendum)
Medication Instructions:  No changes   *If you need a refill on your cardiac medications before your next appointment, please call your pharmacy*   Lab Work: Not needed    Testing/Procedures:  Your physician has recommended that you wear a holter monitor 7 days Zio . Holter monitors are medical devices that record the heart's electrical activity. Doctors most often use these monitors to diagnose arrhythmias. Arrhythmias are problems with the speed or rhythm of the heartbeat. The monitor is a small, portable device. You can wear one while you do your normal daily activities. This is usually used to diagnose what is causing palpitations/syncope (passing out).   Follow-Up: At [facility], you and your health needs are our priority.  As part of our continuing mission to provide you with exceptional heart care, we have created designated Provider Care Teams.  These Care Teams include your primary Cardiologist (physician) and Advanced Practice Providers (APPs -  Physician Assistants and Nurse Practitioners) who all work together to provide you with the care you need, when you need it.     Your next appointment:    3 to 4 week(s)  The format for your next appointment:   In Person  Provider:     With  Coletta Memos NP and  [name], MD in June or July  2024   Other Instructions  ZIO XT- Long Term Monitor Instructions  Your physician has requested you wear a ZIO patch monitor for 7 days.  This is a single patch monitor. [employer] supplies one patch monitor per enrollment. Additional stickers are not available. Please do not apply patch if you will be having a Nuclear Stress Test,  Echocardiogram, Cardiac CT, MRI, or Chest Xray during the period you would be wearing the  monitor. The patch cannot be worn during these tests. You cannot remove and re-apply the  ZIO XT patch monitor.  Your ZIO patch monitor will be mailed 3 day USPS to your address on file. It may take 3-5 days  to  receive your monitor after you have been enrolled.  Once you have received your monitor, please review the enclosed instructions. Your monitor  has already been registered assigning a specific monitor serial # to you.  Billing and Patient Assistance Program Information  We have supplied [employer] with any of your insurance information on file for billing purposes. [employer] offers a sliding scale Patient Assistance Program for patients that do not have  insurance, or whose insurance does not completely cover the cost of the ZIO monitor.  You must apply for the Patient Assistance Program to qualify for this discounted rate.  To apply, please call [employer] at [phone], select option 4, select option 2, ask to apply for  Patient Assistance Program. Theodore Demark will ask your household income, and how many people  are in your household. They will quote your out-of-pocket cost based on that information.  [employer] will also be able to set up a 73-month interest-free payment plan if needed.  Applying the monitor   Shave hair from upper left chest.  Hold abrader disc by orange tab. Rub abrader in 40 strokes over the upper left chest as  indicated in your monitor instructions.  Clean area with 4 enclosed alcohol pads. Let dry.  Apply patch as indicated in monitor instructions. Patch will be placed under collarbone on left  side of chest with arrow pointing upward.  Rub patch adhesive wings for 2 minutes. Remove white label marked "1". Remove the white  label marked "2". Rub patch adhesive wings for 2 additional minutes.  While looking in a mirror, press and release button in center of patch. A small green light will  flash 3-4 times. This will be your only indicator that the monitor has been turned on.  Do not shower for the first 24 hours. You may shower after the first 24 hours.  Press the button if you feel a symptom. You will hear a small click. Record Date, Time and  Symptom in the Patient  Logbook.  When you are ready to remove the patch, follow instructions on the last 2 pages of Patient  Logbook. Stick patch monitor onto the last page of Patient Logbook.  Place Patient Logbook in the blue and white box. Use locking tab on box and tape box closed  securely. The blue and white box has prepaid postage on it. Please place it in the mailbox as  soon as possible. Your physician should have your test results approximately 7 days after the  monitor has been mailed back to [facility].  Call [facility] [place] at [phone] if you have questions regarding  your ZIO XT patch monitor. Call them immediately if you see an orange light blinking on your  monitor.  If your monitor falls off in less than 4 days, contact our Monitor department at [phone].  If your monitor becomes loose or falls off after 4 days call [employer] at [phone] for  suggestions on securing your monitor

## 2022-09-01 NOTE — Assessment & Plan Note (Signed)
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  I would like to see his PVC burden as this could be a marker of ischemia.  Would like to keep his PVC burden down which may mean adjusting his beta-blocker dose.  Plan: Check 7-day Zio patch

## 2022-09-01 NOTE — Assessment & Plan Note (Signed)
Stable BP.  Please be tolerating the increased dose of amlodipine.  If he does have a high PVC burden, we may want to adjust his beta-blocker to a more rhythm related beta-blocker as opposed to blood pressure related beta-blocker.  In that case, we may need to titrate amlodipine further

## 2022-09-01 NOTE — Assessment & Plan Note (Signed)
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  He and his wife are quite emotional today when we brought this up.  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  He actually looks great.  Interestingly the stent placed at that time as well as the second stent placed.  Preserved EF with no CHF symptoms for.  Being evaluated with symptom management of RCA CTO to determine medical management versus PCI.

## 2022-09-01 NOTE — Assessment & Plan Note (Signed)
Bigeminy does not seem to be a new thing for him, but it is related to his ischemia.  7-day Zio patch monitor to assess burden.

## 2022-09-01 NOTE — Assessment & Plan Note (Addendum)
Terry Wright is well aware of his anginal symptoms and I am quite certain that episode he had leading to the visit with Richardson Dopp was consistent with angina.  Thankfully, he is relatively asymptomatic at this point.  We spent quite a bit of time reviewing his Films, and discussed potential CTO versus medical management.  There is a little bit of a sense of urgency and deciding what to do because at this current time, RCA PCI will be more favorable since there is still long segment of the RCA that is still subtotally occluded. The RCA being occluded is probably related to the distal RCA bifurcation lesion seen during his PCI in 2019.  Likely had progression of disease.  The plan is to continue Ranexa which was started post cath along with his amlodipine and carvedilol for antianginal benefit.  I will have him follow-up with Coletta Memos, NP in roughly 3 to 4 weeks to reassess symptoms.  If he is symptom-free, then the plan will be to monitor and treat medically.  However, if he has had more chest pain symptoms with rest or exertion, or is having notable exertional dyspnea, I think referral to Dr. Martinique to discuss CTO PCI would be warranted.  Plan: For now continue amlodipine 5 mg daily along with carvedilol 12.5 mg twice daily Because of PVCs noted prominently during cardiac cath, we will check a 7-day Zio patch to assess PVC burden.  We may need to switch from carvedilol to Toprol to allow for more beta-blockade without lowering his blood pressure too much.  This would allow [place] to then titrate amlodipine further if necessary. Currently his heart rate is 56 on this dose of carvedilol which would make it hard to push this dose any further. Continue current dose of rosuvastatin and follow labs closely.  He should be due to have labs probably in the next month or so with his PCP.  If not, they can be drawn when he sees Coletta Memos, NP. For now we will continue long-term clopidogrel,  essentially lifelong, but  okay to hold for procedures or surgeries 5 to 7 days preop.

## 2022-09-01 NOTE — Assessment & Plan Note (Signed)
Being followed by Dr. Yong Channel.  Last A1c was 6.3 which is good that he is now low diabetes range on no actual medication.  He has made significant lifestyle changes having lost weight and change his diet.  With his severe CAD, close glycemic control is necessary.

## 2022-09-01 NOTE — Assessment & Plan Note (Signed)
He is now back on 40 mg rosuvastatin.  Hopefully we will see an improvement in his LDL having recently gone up to 69 by most recent check.  He should be due for labs to be checked by PCP probably in March timeframe.

## 2022-09-01 NOTE — Progress Notes (Unsigned)
Enrolled patient for a 7 day Zio XT monitor to be mailed to patients home.  

## 2022-09-05 DIAGNOSIS — I493 Ventricular premature depolarization: Secondary | ICD-10-CM

## 2022-09-05 DIAGNOSIS — I25119 Atherosclerotic heart disease of native coronary artery with unspecified angina pectoris: Secondary | ICD-10-CM

## 2022-09-05 DIAGNOSIS — I498 Other specified cardiac arrhythmias: Secondary | ICD-10-CM

## 2022-09-05 DIAGNOSIS — I2121 ST elevation (STEMI) myocardial infarction involving left circumflex coronary artery: Secondary | ICD-10-CM | POA: Diagnosis not present

## 2022-09-11 ENCOUNTER — Other Ambulatory Visit (HOSPITAL_COMMUNITY): Payer: PPO

## 2022-09-18 DIAGNOSIS — I25119 Atherosclerotic heart disease of native coronary artery with unspecified angina pectoris: Secondary | ICD-10-CM | POA: Diagnosis not present

## 2022-09-18 DIAGNOSIS — I498 Other specified cardiac arrhythmias: Secondary | ICD-10-CM | POA: Diagnosis not present

## 2022-09-29 ENCOUNTER — Ambulatory Visit: Payer: PPO | Admitting: General Practice

## 2022-09-29 NOTE — Progress Notes (Signed)
Cardiology Clinic Note   Patient Name: Terry Wright Date of Encounter: 10/06/2022  Primary Care Provider:  Marin Olp, MD Primary Cardiologist:  [name], MD  Patient Profile    Terry Wright 67 year old male presents to the clinic today for follow-up evaluation of his coronary artery disease.  Past Medical History    Past Medical History:  Diagnosis Date   Anginal pain ([place]) 2018   Anxiety    Arthritis    shoulders hips,    CAD S/P percutaneous coronary angioplasty 07/2016   a) 07/2016: 100% pCx -- DES PCI with 4.0 x 16 mm Synergy DES. 65% ostial OM 3 (relatively small caliber - Med Rx);; b) 03/27/18 PCI/DES to dLCX/OM3 x1. Patent pCX DES. 70% dRCA trifurcation lesion (Med Rx). Normal EF;; 07/2022: Patent Cx/LPAV Stents patent. ~mRCA 95%--> dRCA 100% (prior 90% bifurcation lesion). pLAD 55%, m-dLAD 45%. L-R Collaterals fill PAVPL & PDA.   COPD (chronic obstructive pulmonary disease) ([place]) 2018   mild   DVT (deep venous thrombosis) (H[facility])    Right upper arm DVT on 2 occasions in the remote past   Dysrhythmia    V tac   Ejection fraction    LV function normal, echo, February, 2010   GERD (gastroesophageal reflux disease)    Hyperlipidemia    Hypertension    Mitral regurgitation    Mild, echo, February, 2010   Palpitations    Mild in the past   Peripheral vascular disease ([place]) 1980s   DVT   Pre-diabetes    Statin intolerance    Felt poorly after Lipitor, Crestor, TriCor   STEMI involving left circumflex coronary artery ([place]) 08/04/2016   Occluded very large caliber codominant circumflex - PCI with single Synergy DES   Tobacco abuse    Past Surgical History:  Procedure Laterality Date   CARDIAC CATHETERIZATION N/A 08/04/2016   Procedure: Left Heart Cath and Coronary Angiography;  Surgeon: Leonie Man, MD;  Location: [facility];  Service: Cardiovascular;  Laterality: N/A: 100% pCx --> PCI. residual 65% pOM3 (small).  ~EF  35-45%  with lateral HK.   CARDIAC CATHETERIZATION N/A 08/04/2016   Procedure: CORONARY STENT INTERVENTION;  Surgeon: Leonie Man, MD;  Location: [place] [facility];  Service: Cardiovascular;  Laterality: N/A: pCx 100%-0%: Synergy DES 4.0 x 16 (4.6 mm)   CHONDROPLASTY Right 04/04/2017   Procedure: CHONDROPLASTY;  Surgeon: Dorna Leitz, MD;  Location: [facility];  Service: Orthopedics;  Laterality: Right;   CORONARY STENT INTERVENTION N/A 03/27/2018   Procedure: CORONARY STENT INTERVENTION;  Surgeon: Martinique, Peter M, MD;  Location: [place] [facility];  Service: Cardiovascular:: dCx-OM3 95%-0%: DES PCI - Synergy 2.25 x 16 mm.   GANGLION CYST EXCISION     oct 2017   HERNIA REPAIR     umbilical hernia 2017 oct   KNEE ARTHROSCOPY Right 04/04/2017   Procedure: ARTHROSCOPY RIGHT KNEE, WITH MEIDAL FEMORAL CONDYLE PATELLAR MEDIAL FEMORAL JOINT PLICA EXCISION ;  Surgeon: Dorna Leitz, MD;  Location: [facility];  Service: Orthopedics;  Laterality: Right;   LEFT HEART CATH AND CORONARY ANGIOGRAPHY N/A 03/27/2018   Procedure: LEFT HEART CATH AND CORONARY ANGIOGRAPHY;  Surgeon: Martinique, Peter M, MD;  Location: [place] [facility];  Service: Cardiovascular:   2 vessel obstructive CAD: - 95% dLCx/OM3 --Successful PCI of the dLCx/OM3 with SYNERGY DES 2.25X16, -70% distal RCA at trifurcation (Med Management).  Widely patent pLCx DES. Low normal LV function (50-55%), Normal LVEDP  LEFT HEART CATH AND CORONARY ANGIOGRAPHY N/A 08/30/2022   Procedure: LEFT HEART CATH AND CORONARY ANGIOGRAPHY;  Surgeon: Leonie Man, MD;  Location: [facility];  Service: CV. ::pLAD 55%, m-d LAD 45%. pRCA-dRCA 95% (SubTO) -> dRCA 100% CTO prior to Bifurcation w/ 90-95% into PDA & PAV - both fill via L-R collaterals. pCx DES & LPAV stent patent.  No AS. LVEDP 16 mmHg.   NM MYOVIEW LTD  08/23/2022   INTERMEDIATE RISK.  EF~39%.  Infero-septal wall hypokinesis.  No ST segment changes.  Partially veritable medium sized, moderate  severity defect in the mid to basal inferoseptal wall.; Considered Infarct w/ Peri-Infarct Ischemia. => Referred for Cath.   TOTAL HIP ARTHROPLASTY Left 04/08/2020   Procedure: TOTAL HIP ARTHROPLASTY ANTERIOR APPROACH;  Surgeon: Paralee Cancel, MD;  Location: [facility];  Service: Orthopedics;  Laterality: Left;  70 mins   TOTAL HIP ARTHROPLASTY Right 05/02/2022   Procedure: TOTAL HIP ARTHROPLASTY ANTERIOR APPROACH;  Surgeon: Paralee Cancel, MD;  Location: [facility];  Service: Orthopedics;  Laterality: Right;  90   TRANSTHORACIC ECHOCARDIOGRAM  08/22/2022   Normal LV Size &Fxn - EF ~ 55-65%. No RWMA. Mild Conc LVH with Severe LA dilation (but ? Normal Diastolic Fxn). Normal RV size & Fxn with normal RVP & RAP. Normal MV & AoV.   TRANSTHORACIC ECHOCARDIOGRAM  01/11/2017   a) a) 07/2016: post STEMI: EF 40-45%. Mild concentric LVH. Severe HK of basal-mid inferolateral wall consistent with infarct in this distribution. GR 1 DD.;; b) 12/2016: EF 50-55%. GR 1 DD. Mild to moderate LA dilation.    Allergies  Allergies  Allergen Reactions   Metformin     After starting metformin- Severe hip pain bilaterally worse on left. Could not turn neck. Back pain. Symptoms drastically improved off metformin    History of Present Illness    Terry Wright has a past medical history of coronary artery disease with PCI in the setting of inferior STEMI/VT arrest 07/2016 and repeat PCI for angina in 02/2018.  He also has hypertension, history of ischemic cardiomyopathy, mitral regurgitation, GERD, emphysema, dyslipidemia, and panic attacks.   He was last seen by Dr. Ellyn Hack on 04/29/2019.  During that time he was doing well.  His aspirin was discontinued and his Plavix was continued.  He continued to be very active, his blood pressure was well controlled on 12.5 carvedilol twice daily, and denied chest pain.   He presented to the emergency department on 08/12/2019.  EKG normal sinus rhythm first-degree AV block,  high-sensitivity troponin 7 and 8, chest x-ray normal, BMP and CBC WDL.   He presented the cardiology clinic 08/13/2019 for further evaluation and stated he began to notice chest discomfort that he describes as intermittent ongoing 08/11/2019.  He drove himself to the emergency department waited in the parking lot and his chest discomfort subsided.  He drove home in the evening after eating he was going to bed and developed chest pain again.  He took 1 nitroglycerin but did not alleviate his symptoms.  He again presented to the emergency department where he received a negative ischemic eval.  He became worried due to his symptoms being similar to his previous MI.  He stated he was compliant with all of his medications including his pantoprazole.  He continued to be physically active and walk 30 minutes briskly most days of the week.  This did not bring on chest discomfort or pain.  His blood pressure has been well controlled, he  had a regular rate and rhythm, no increased stress, and had not changed his diet.  I  added amlodipine 2.5 daily and planned follow-up with Dr. Ellyn Hack with his previously scheduled appointment.   He was seen by Dr. Ellyn Hack on 10/24/2019.  He reported he was feeling better.  He denied further episodes of chest discomfort.  He continued to be very physically active and gone back to the gym.  He also reported increased walking.  He denied PND and orthopnea.  He reported that he only noted chest discomfort at rest and no exertional chest discomfort previously.  He denied palpitations.  His only limiting factor was hip pain with walking.  He was seen again in follow-up on 05/27/2020.  During that time he remained stable.  He denied orthopnea PND, palpitations, syncope.  He underwent hip surgery which went well.  Follow-up was planned for 6 months.   He presented to the clinic 4/22 for follow-up evaluation stated he felt well.  He felt he  finally  recovered from his left hip surgery.  He  planned to do his right hip surgery but planned postpone the surgery as long as he can.  He was walking more than 10,000 steps a day and used his treadmill or stationary bike daily.    He underwent stress testing 08/23/2022 which showed intermediate risk and an EF of 39%.  No ST changes were noted.  He was noted to have partial medium sized, moderate severity defect in the mid to basal inferior septal wall.  Due to the size and disruption noted he was referred for cardiac catheterization.  His echocardiogram 08/22/2022 showed an EF of 55-65% with mild chronic LVH.    He was seen in follow-up by Dr. Ellyn Hack on 09/01/2022.  He was being seen for post cath follow-up.  He was accompanied by his wife.  His exertional dyspnea that led up to his catheterization was discussed.  His catheterization was reviewed.  He was asymptomatic.  Following his catheterization he was started on Ranexa 500 milligrams twice daily.  It was also discussed that his carvedilol may be switched to metoprolol for up titration.  He only noted palpitations with increased stress and anxiety.Cardiac catheterization 08/30/2022 showed proximal LAD 55%, mid-distal LAD 45%, proximal RCA-distal RCA 95% subtotal occlusion, distal RCA 100% chronic total occlusion prior to bifurcation with 90-95% into PDA and BAV-both filled via left-right collaterals.  Circumflex DES and L PLV stent were patent.  He was noted to have frequent PVCs with bigeminy.  Images were shared with Dr. Martinique who felt potentially he could be a candidate for CTO PCI.  Plan was made for follow-up in 3 to 4 weeks.  If remained asymptomatic medical management was recommended.  If more chest pain symptoms with rest or exertion, plan for referral to Dr. Martinique to discuss CTO PCI.  He contacted the office 10/03/2022 and noted that he had lower extremity swelling.  It was felt that it may be related to higher dose of amlodipine.  It was recommended that he elevate his lower extremities a couple  of times during the day and wear lower extremity support stockings for long periods of travel.  He presents to the clinic today for follow-up evaluation and states he continues to not have any chest pain symptoms.  He does note slight ankle swelling which he reports has improved.  He presents with his wife.  We reviewed his echocardiogram and cardiac catheterization.  They expressed understanding.  We discussed the  importance of managing cholesterol and eating a high-fiber diet.  They plan to travel to [place] today and he will put on his lower extremity support stockings.  We discussed his anxiety.  He reports that he was having to take extra antianxiety medication due to increased anxiety with recent cardiac testing and diagnosis.  He has only been taking lorazepam at night to help with sleep.  I do feel he needs this medication however, we do not prescribe and he reports he is concerned that his PCP will not continue to prescribe the medication.  Throughout the day he is able to do physical activity and other activities that take his mind off of his coronary disease.  I will give him high-fiber diet options, have him follow a low-cholesterol diet and plan follow-up as scheduled.   Today he denies chest pain, shortness of breath, lower extremity edema, fatigue, palpitations, melena, hematuria, hemoptysis, diaphoresis, weakness, presyncope, syncope, orthopnea, and PND.  Coronary artery disease-tolerating Ranexa well.  Denies chest pain.  At this time we will continue with medical management. Continue to monitor for chest discomfort, anginal equivalents     Home Medications    Prior to Admission medications   Medication Sig Start Date End Date Taking? Authorizing Provider  acetaminophen (TYLENOL) 650 MG CR tablet Take 650-1,300 mg by mouth every 8 (eight) hours as needed for pain.    [provider]  amLODipine (NORVASC) 5 MG tablet Take 2 tablets (10 mg total) by mouth daily. 08/30/22    Leonie Man, MD  carvedilol (COREG) 12.5 MG tablet Take 1 tablet (12.5 mg total) by mouth 2 (two) times daily. 10/28/21   Leonie Man, MD  clopidogrel (PLAVIX) 75 MG tablet Take 1 tablet (75 mg total) by mouth daily. 08/14/22   Leonie Man, MD  LORazepam (ATIVAN) 0.5 MG tablet TAKE 1 TABLET BY MOUTH AT BEDTIME AS NEEDED FOR ANXIETY. DO not drive FOR EIGHT hours AFTER taking Patient taking differently: Take 0.5 mg by mouth at bedtime. 07/18/22   Marin Olp, MD  nitroGLYCERIN (NITROSTAT) 0.4 MG SL tablet place 1 TABLET UNDER THE TONGUE EVERY 5 MINUTES AS NEEDED FOR CHEST PAIN 08/25/22   Marin Olp, MD  pantoprazole (PROTONIX) 40 MG tablet TAKE 1 TABLET BY MOUTH EVERY DAY 08/01/22   Leonie Man, MD  ranolazine (RANEXA) 500 MG 12 hr tablet Take 1 tablet (500 mg total) by mouth 2 (two) times daily. 08/30/22   Leonie Man, MD  rosuvastatin (CRESTOR) 40 MG tablet Take 1 tablet (40 mg total) by mouth daily. 04/07/22   Marin Olp, MD  triamcinolone lotion (KENALOG) 0.1 % Apply 1 application  topically 2 (two) times daily as needed (skin irritation). Mix lotion with cream and apply twice daily to affected area    [provider]    Family History    Family History  Problem Relation Age of Onset   Heart attack Mother        x2- lived into 29s   Hypertension Father        lived into 81s   Other Brother        killed in [place]   He indicated that his mother is deceased. He indicated that his father is deceased. He indicated that his brother is deceased.  Social History    Social History   Socioeconomic History   Marital status: Married    Spouse name: [name]   Number of children: Not on file  Years of education: 65   Highest education level: High school graduate  Occupational History   Not on file  Tobacco Use   Smoking status: Former    Packs/day: 1.00    Years: 43.00    Total pack years: 43.00    Types: Cigarettes    Quit date: 2015     Years since quitting: 9.1   Smokeless tobacco: Never   Tobacco comments:    Former smoker quit 2015  Vaping Use   Vaping Use: Never used  Substance and Sexual Activity   Alcohol use: Yes    Alcohol/week: 7.0 standard drinks of alcohol    Types: 7 Glasses of wine per week    Comment: occasional   Drug use: No   Sexual activity: Not on file  Other Topics Concern   Not on file  Social History Narrative   Married 10 years in 2018. 2 step kids- 21 and 46. 3 kids but he does not have contact with. No grandkids      Retired Theatre stage manager.    Wife working [employer]      Hobbies: golf (needs shoulder replacement though, working in the yard, working out- was doing this regularly even before MI- walking/treadmill   Social Determinants of Radio broadcast assistant Strain: Low Risk  (02/23/2022)   Overall Financial Resource Strain (CARDIA)    Difficulty of Paying Living Expenses: Not hard at all  Food Insecurity: No Food Insecurity (05/02/2022)   Hunger Vital Sign    Worried About Running Out of Food in the Last Year: Never true    [place] in the Last Year: Never true  Transportation Needs: No Transportation Needs (05/02/2022)   PRAPARE - Hydrologist (Medical): No    Lack of Transportation (Non-Medical): No  Physical Activity: Insufficiently Active (02/23/2022)   Exercise Vital Sign    Days of Exercise per Week: 4 days    Minutes of Exercise per Session: 30 min  Stress: No Stress Concern Present (02/23/2022)   [place]    Feeling of Stress : Not at all  Social Connections: Moderately Integrated (02/23/2022)   Social Connection and Isolation Panel [NHANES]    Frequency of Communication with Friends and Family: More than three times a week    Frequency of Social Gatherings with Friends and Family: More than three times a week    Attends Religious Services: Never    Museum/gallery conservator or Organizations: Yes    Attends Archivist Meetings: 1 to 4 times per year    Marital Status: Married  Human resources officer Violence: Not At Risk (05/02/2022)   Humiliation, Afraid, Rape, and Kick questionnaire    Fear of Current or Ex-Partner: No    Emotionally Abused: No    Physically Abused: No    Sexually Abused: No     Review of Systems    General:  No chills, fever, night sweats or weight changes.  Cardiovascular:  No chest pain, dyspnea on exertion, edema, orthopnea, palpitations, paroxysmal nocturnal dyspnea. Dermatological: No rash, lesions/masses Respiratory: No cough, dyspnea Urologic: No hematuria, dysuria Abdominal:   No nausea, vomiting, diarrhea, bright red blood per rectum, melena, or hematemesis Neurologic:  No visual changes, wkns, changes in mental status. All other systems reviewed and are otherwise negative except as noted above.  Physical Exam    VS:  BP 126/78 (BP Location: Left Arm, Patient  Position: Sitting, Cuff Size: Normal)   Pulse 70   Ht '6\' 1"'$  (1.854 m)   Wt 220 lb 6.4 oz (100 kg)   SpO2 95%   BMI 29.08 kg/m  , BMI Body mass index is 29.08 kg/m. GEN: Well nourished, well developed, in no acute distress. HEENT: normal. Neck: Supple, no JVD, carotid bruits, or masses. Cardiac: RRR, no murmurs, rubs, or gallops. No clubbing, cyanosis, slight bilateral lower extremity nonpitting edema.  Radials/DP/PT 2+ and equal bilaterally.  Respiratory:  Respirations regular and unlabored, clear to auscultation bilaterally. GI: Soft, nontender, nondistended, BS + x 4. MS: no deformity or atrophy. Skin: warm and dry, no rash. Neuro:  Strength and sensation are intact. Psych: Normal affect.  Accessory Clinical Findings    Recent Labs: 05/08/2022: ALT 76 08/25/2022: BUN 18; Creatinine, Ser 0.82; Hemoglobin 14.0; Platelets 236; Potassium 4.2; Sodium 139   Recent Lipid Panel    Component Value Date/Time   CHOL 136 04/19/2021 0832   CHOL 110  05/09/2017 0821   TRIG 143.0 04/19/2021 0832   HDL 41.50 04/19/2021 0832   HDL 35 (L) 05/09/2017 0821   CHOLHDL 3 04/19/2021 0832   VLDL 28.6 04/19/2021 0832   LDLCALC 66 04/19/2021 0832   LDLCALC 67 03/15/2020 1529   LDLDIRECT 69.0 02/21/2022 1107         ECG personally reviewed by me today-sinus rhythm with first-degree AV block- No acute changes  Echocardiogram 08/22/2022  IMPRESSIONS     1. Left ventricular ejection fraction, by estimation, is 55 to 60%. Left  ventricular ejection fraction by 3D volume is 66 %. The left ventricle has  normal function. The left ventricle has no regional wall motion  abnormalities. There is mild concentric  left ventricular hypertrophy. Left ventricular diastolic parameters were  normal.   2. Right ventricular systolic function is normal. The right ventricular  size is normal.   3. Left atrial size was severely dilated.   4. The mitral valve is normal in structure. Trivial mitral valve  regurgitation. No evidence of mitral stenosis.   5. The aortic valve is tricuspid. Aortic valve regurgitation is not  visualized. No aortic stenosis is present.   6. The inferior vena cava is normal in size with greater than 50%  respiratory variability, suggesting right atrial pressure of 3 mmHg.   Comparison(s): No significant change from prior study. Prior images  reviewed side by side. Very frequent PVCs are recorded during the study.  This may explain poor gating during nuclear scintigraphy.   FINDINGS   Left Ventricle: Impaired relaxation pattern may be due to low left atrial  pressure. Left ventricular ejection fraction, by estimation, is 55 to 60%.  Left ventricular ejection fraction by 3D volume is 66 %. The left  ventricle has normal function. The  left ventricle has no regional wall motion abnormalities. Global  longitudinal strain performed but not reported based on interpreter  judgement due to suboptimal tracking. 3D left ventricular  ejection  fraction analysis performed but not reported based on  interpreter judgement due to suboptimal tracking. The left ventricular  internal cavity size was normal in size. There is mild concentric left  ventricular hypertrophy. Left ventricular diastolic parameters were  normal. Normal left ventricular filling  pressure.     LV Wall Scoring:  The inferior wall and posterior wall are hypokinetic.   Right Ventricle: The right ventricular size is normal. No increase in  right ventricular wall thickness. Right ventricular systolic function is  normal.  Left Atrium: Left atrial size was severely dilated.   Right Atrium: Right atrial size was normal in size.   Pericardium: There is no evidence of pericardial effusion.   Mitral Valve: The mitral valve is normal in structure. Trivial mitral  valve regurgitation. No evidence of mitral valve stenosis.   Tricuspid Valve: The tricuspid valve is normal in structure. Tricuspid  valve regurgitation is not demonstrated. No evidence of tricuspid  stenosis.   Aortic Valve: The aortic valve is tricuspid. Aortic valve regurgitation is  not visualized. No aortic stenosis is present.   Pulmonic Valve: The pulmonic valve was normal in structure. Pulmonic valve  regurgitation is not visualized. No evidence of pulmonic stenosis.   Aorta: The aortic root and ascending aorta are structurally normal, with  no evidence of dilitation.   Venous: The inferior vena cava is normal in size with greater than 50%  respiratory variability, suggesting right atrial pressure of 3 mmHg.   IAS/Shunts: No atrial level shunt detected by color flow Doppler.    Cardiac catheterization 08/30/2022   Prox LAD lesion is 55% stenosed.  Mid LAD to Dist LAD lesion is 45% stenosed.   Prox RCA to Dist RCA lesion is 95% stenosed. Dist RCA-1 lesion is 100% stenosed.   Dist RCA-2 lesion is 90% stenosed with 95% stenosed side branch in RPAV. ->  Both branches fill via  left-to-right collaterals with faint filling.   Previously placed Prox Cx to Dist Cx DES stent is  widely patent.   Previously placed LPAV DES stent is  widely patent.   LV end diastolic pressure is mildly elevated.   There is no aortic valve stenosis.   Multivessel disease:. Culprit lesion is mid RCA severe 95% stenosis tapering to 100% CTO just possible to the bifurcation into PDA PL (left right collaterals noted) Mild progression of proximal LAD stenosis 55% with mid LAD percent Widely patent proximal Cx stent as well as AV groove Cx stent   LVEDP 16 mmHg  Frequent PVCs with bigeminy     RECOMMENDATIONS Plan for now will be to titrate medical management, and reassess symptoms after 3 to 4 weeks.  If still symptomatic, would then consider referral for CTO PCI of RCA. Increase amlodipine to 10 mg daily Add Ranexa 500 mg twice daily Consider converting from carvedilol to Toprol to allow BP room and increase beta-blockade due to significant PVCs     [name], MD  Diagnostic Dominance: Right  Intervention   Assessment & Plan   1.  Coronary artery disease-tolerating Ranexa well.  Denies chest pain.  At this time we will continue with medical management. Continue to monitor for chest discomfort, anginal equivalents  PVCs-only notes occasional palpitations.  Cardiac event monitor 2-24 showed minimum heart rate of 57, maximum heart rate of 115 average heart rate of 74.  Predominant rhythm was sinus.  5 ventricular tachycardia runs occurred with the fastest being 4 beats, 2 SVT episode occurred with the longest being 5 beats.  Also rare ventricular ectopy was noted. Maintain p.o. hydration, avoid triggers Continue carvedilol  Hyperlipidemia-LDL 66 on 04/19/2021. Heart healthy low-sodium high-fiber diet Continue rosuvastatin Increase physical activity as tolerated  Essential hypertension-BP today 126/78 Heart healthy low-sodium diet-salty 6 reviewed Continue carvedilol,  amlodipine  Lower extremity swelling-slight ankle edema.  Elevate lower extremities throughout the day when not active. Lower extremity support stockings Low-sodium diet  Anxiety-needed to take extra lorazepam for sleep while waiting for cardiac catheterization.  Through the day he is able  to manage his stress with physical activity. Continue current medical therapy Follows with Dr. Yong Channel  Disposition: Follow-up with Dr. Ellyn Hack in 3-4 months.   Jossie Ng. Alexiz Cothran NP-C     10/06/2022, 2:57 PM [facility] [address] Office [phone] Fax [phone]    I spent 15 minutes examining this patient, reviewing medications, and using patient centered shared decision making involving her cardiac care.  Prior to her visit I spent greater than 20 minutes reviewing her past medical history,  medications, and prior cardiac tests.

## 2022-10-02 ENCOUNTER — Telehealth: Payer: Self-pay | Admitting: Cardiology

## 2022-10-02 MED ORDER — AMLODIPINE BESYLATE 10 MG PO TABS: 10.0000 mg | ORAL_TABLET | Freq: Every day | ORAL | 3 refills | Status: DC

## 2022-10-02 NOTE — Telephone Encounter (Signed)
Called pt and made him aware of the change in dosage for amlodipine. Pt verbalizes understanding.  Pt also mentions while on the phone that he has noticed lower extremity swelling since increasing amlodipine and starting ranolazine. Pt states that he doesn't typically elevate his legs but he does notice a difference after sleeping. Pt is aware of salt restriction. He does not wear compression stockings. Pt wants me to mention this to Dr. Ellyn Hack. Explained that swelling could be a side effect of both medications. Will send to Dr. Ellyn Hack to advise. Pt verbalizes understanding.

## 2022-10-02 NOTE — Telephone Encounter (Signed)
Spoke with [facility] at [facility] regarding pt's amlodipine. Per review of the chart it looks like medication was increased when pt was leaving the hospital after cardiac cath. Prescription was sent for '5mg'$  tablets to take 2 (total '10mg'$ ) once daily but only 90 tablets were sent in for a 45 day supply. Will send in a new prescription for '10mg'$  tablets once daily, 90 day supply with 3 refills. Latoya verbalizes understanding.

## 2022-10-02 NOTE — Telephone Encounter (Signed)
Pt c/o medication issue:  1. Name of Medication: amLODipine (NORVASC) 5 MG tablet   2. How are you currently taking this medication (dosage and times per day)?   Take 2 tablets (10 mg total) by mouth daily.    3. Are you having a reaction (difficulty breathing--STAT)? No  4. What is your medication issue? [facility] called asking about the refill amount, they were unsure if should be 3 month supply per usual or if the dosage amount should be for 45 days

## 2022-10-03 DIAGNOSIS — H02831 Dermatochalasis of right upper eyelid: Secondary | ICD-10-CM | POA: Diagnosis not present

## 2022-10-03 DIAGNOSIS — H2513 Age-related nuclear cataract, bilateral: Secondary | ICD-10-CM | POA: Diagnosis not present

## 2022-10-03 DIAGNOSIS — H02834 Dermatochalasis of left upper eyelid: Secondary | ICD-10-CM | POA: Diagnosis not present

## 2022-10-03 LAB — HM DIABETES EYE EXAM

## 2022-10-03 NOTE — Telephone Encounter (Signed)
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  Would prefer to avoid a fluid pill just because of the swelling-treating 1 side effect for another.  Recommendation will be elevating feet a couple times during the day as well as at night.  Try to get it least half an hour foot elevation maybe 2 times during the day.  Also walking will help.  If he is going to be sedentary (in a car or sitting for long period time, would wear support socks.Marland KitchenMarland KitchenGlenetta Hew, MD

## 2022-10-04 NOTE — Telephone Encounter (Signed)
Spoke with pt regarding Dr. Allison Quarry recommendations. All questions answered and pt verbalizes understanding.

## 2022-10-06 ENCOUNTER — Ambulatory Visit: Payer: PPO | Attending: General Practice | Admitting: General Practice

## 2022-10-06 ENCOUNTER — Encounter: Payer: Self-pay | Admitting: General Practice

## 2022-10-06 VITALS — BP 126/78 | HR 70 | Ht 73.0 in | Wt 220.4 lb

## 2022-10-06 DIAGNOSIS — I493 Ventricular premature depolarization: Secondary | ICD-10-CM

## 2022-10-06 DIAGNOSIS — E785 Hyperlipidemia, unspecified: Secondary | ICD-10-CM | POA: Diagnosis not present

## 2022-10-06 DIAGNOSIS — I10 Essential (primary) hypertension: Secondary | ICD-10-CM | POA: Diagnosis not present

## 2022-10-06 DIAGNOSIS — I25119 Atherosclerotic heart disease of native coronary artery with unspecified angina pectoris: Secondary | ICD-10-CM | POA: Diagnosis not present

## 2022-10-06 DIAGNOSIS — R600 Localized edema: Secondary | ICD-10-CM | POA: Diagnosis not present

## 2022-10-06 NOTE — Patient Instructions (Signed)
Medication Instructions:   Your physician recommends that you continue on your current medications as directed. Please refer to the Current Medication list given to you today.  *If you need a refill on your cardiac medications before your next appointment, please call your pharmacy*  Lab Work: NONE ordered at this time of appointment   If you have labs (blood work) drawn today and your tests are completely normal, you will receive your results only by: [place] (if you have MyChart) OR A paper copy in the mail If you have any lab test that is abnormal or we need to change your treatment, we will call you to review the results.  Testing/Procedures: NONE ordered at this time of appointment   Follow-Up: At [facility], you and your health needs are our priority.  As part of our continuing mission to provide you with exceptional heart care, we have created designated Provider Care Teams.  These Care Teams include your primary Cardiologist (physician) and Advanced Practice Providers (APPs -  Physician Assistants and Nurse Practitioners) who all work together to provide you with the care you need, when you need it.   Your next appointment:   As previously scheduled   Provider:   [name], MD     Other Instructions Maintain physical activity as tolerated  Limit food intake that are high in cholesterol  Maintain a high fiber diet please read information given            High-Fiber Eating Plan Fiber, also called dietary fiber, is a type of carbohydrate. It is found foods such as fruits, vegetables, whole grains, and beans. A high-fiber diet can have many health benefits. Your health care provider may recommend a high-fiber diet to help: Prevent constipation. Fiber can make your bowel movements more regular. Lower your cholesterol. Relieve the following conditions: Inflammation of veins in the anus (hemorrhoids). Inflammation of specific areas of the digestive tract  (uncomplicated diverticulosis). A problem of the large intestine, also called the colon, that sometimes causes pain and diarrhea (irritable bowel syndrome, or IBS). Prevent overeating as part of a weight-loss plan. Prevent heart disease, type 2 diabetes, and certain cancers. What are tips for following this plan? Reading food labels  Check the nutrition facts label on food products for the amount of dietary fiber. Choose foods that have 5 grams of fiber or more per serving. The goals for recommended daily fiber intake include: Men (age 20 or younger): 34-38 g. Men (over age 79): 28-34 g. Women (age 84 or younger): 25-28 g. Women (over age 36): 22-25 g. Your daily fiber goal is _____________ g. Shopping Choose whole fruits and vegetables instead of processed forms, such as apple juice or applesauce. Choose a wide variety of high-fiber foods such as avocados, lentils, oats, and kidney beans. Read the nutrition facts label of the foods you choose. Be aware of foods with added fiber. These foods often have high sugar and sodium amounts per serving. Cooking Use whole-grain flour for baking and cooking. Cook with brown rice instead of white rice. Meal planning Start the day with a breakfast that is high in fiber, such as a cereal that contains 5 g of fiber or more per serving. Eat breads and cereals that are made with whole-grain flour instead of refined flour or white flour. Eat brown rice, bulgur wheat, or millet instead of white rice. Use beans in place of meat in soups, salads, and pasta dishes. Be sure that half of the grains  you eat each day are whole grains. General information You can get the recommended daily intake of dietary fiber by: Eating a variety of fruits, vegetables, grains, nuts, and beans. Taking a fiber supplement if you are not able to take in enough fiber in your diet. It is better to get fiber through food than from a supplement. Gradually increase how much fiber you  consume. If you increase your intake of dietary fiber too quickly, you may have bloating, cramping, or gas. Drink plenty of water to help you digest fiber. Choose high-fiber snacks, such as berries, raw vegetables, nuts, and popcorn. What foods should I eat? Fruits Berries. Pears. Apples. Oranges. Avocado. Prunes and raisins. Dried figs. Vegetables Sweet potatoes. Spinach. Kale. Artichokes. Cabbage. Broccoli. Cauliflower. Green peas. Carrots. Squash. Grains Whole-grain breads. Multigrain cereal. Oats and oatmeal. Brown rice. Barley. Bulgur wheat. [place]. Quinoa. Bran muffins. Popcorn. Rye wafer crackers. Meats and other proteins Navy beans, kidney beans, and pinto beans. Soybeans. Split peas. Lentils. Nuts and seeds. Dairy Fiber-fortified yogurt. Beverages Fiber-fortified soy milk. Fiber-fortified orange juice. Other foods Fiber bars. The items listed above may not be a complete list of recommended foods and beverages. Contact a dietitian for more information. What foods should I avoid? Fruits Fruit juice. Cooked, strained fruit. Vegetables Fried potatoes. Canned vegetables. Well-cooked vegetables. Grains White bread. Pasta made with refined flour. White rice. Meats and other proteins Fatty cuts of meat. Fried chicken or fried fish. Dairy Milk. Yogurt. Cream cheese. Sour cream. Fats and oils Butters. Beverages Soft drinks. Other foods Cakes and pastries. The items listed above may not be a complete list of foods and beverages to avoid. Talk with your dietitian about what choices are best for you. Summary Fiber is a type of carbohydrate. It is found in foods such as fruits, vegetables, whole grains, and beans. A high-fiber diet has many benefits. It can help to prevent constipation, lower blood cholesterol, aid weight loss, and reduce your risk of heart disease, diabetes, and certain cancers. Increase your intake of fiber gradually. Increasing fiber too quickly may cause  cramping, bloating, and gas. Drink plenty of water while you increase the amount of fiber you consume. The best sources of fiber include whole fruits and vegetables, whole grains, nuts, seeds, and beans. This information is not intended to replace advice given to you by your health care provider. Make sure you discuss any questions you have with your health care provider. Document Revised: 11/20/2019 Document Reviewed: 11/20/2019 Elsevier Patient Education  [place].

## 2022-10-10 ENCOUNTER — Encounter: Payer: Self-pay | Admitting: *Deleted

## 2022-10-13 NOTE — Telephone Encounter (Signed)
This encounter was created in error - please disregard.

## 2022-10-14 ENCOUNTER — Encounter: Payer: Self-pay | Admitting: Family Medicine

## 2022-10-16 MED ORDER — LORAZEPAM 0.5 MG PO TABS: ORAL_TABLET | ORAL | 1 refills | Status: DC

## 2022-10-17 ENCOUNTER — Ambulatory Visit: Payer: PPO | Admitting: Family Medicine

## 2022-10-24 DIAGNOSIS — M7989 Other specified soft tissue disorders: Secondary | ICD-10-CM

## 2022-10-27 ENCOUNTER — Telehealth: Payer: Self-pay | Admitting: *Deleted

## 2022-10-27 NOTE — Telephone Encounter (Signed)
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  Heart rate 57 to 117 bpm with an average of 73 bpm.   Rare PACs; PVCs (11.3%).PVCs couplets and triplets as well as bigeminy noted.   5 short bursts of 4 beat PVC runs (technically considered ventricular tachycardia) heart rate ranged from 114 with 150 bpm.   2 atrial runs-558 with heart rate range of 121 and 98 bpm.   Symptoms are noted with PVCs admits quite a bit of them.  This is definitely something that we want to look into potentially controlling PVCs => we talked about potentially switching from carvedilol to Toprol which we can discuss at follow-up visit -> looks like he is scheduled to come back in July.  Would like to see if we can probably have him come back maybe late May or early June.   [name], MD

## 2022-10-27 NOTE — Telephone Encounter (Signed)
Called patient .  Informed patient - Dr Ellyn Hack would like to see him in May 2024. To keep appointment. Patient verbalized he understood and would see Dr Ellyn Hack May 21,2024

## 2022-10-27 NOTE — Telephone Encounter (Signed)
Would be nice to have him come in earlier based upon study results - but am happy to hear that he is doing better.    If it is too hard to get him in sooner - we should keep July appt regardless.  DH.

## 2022-10-27 NOTE — Telephone Encounter (Signed)
Reviewed via mychart.   Contracted patient  to set up an earlier appt than July 2024.  Patient states she he was under a lot of stress during the time,prior to cath and after while he was wearing the monitor. Patient states since he had follow up visit since a the cath and up to today. He has not notice any PVC's and his stress level has gone done. He states he does not have any issue currently. He wanted Dr Ruby Cola to know- whether he needs appt in May or keep appointment  for July  2024 . RN informed patient will send message to Dr Ellyn Hack for review.  Patient voiced understanding.

## 2022-11-17 ENCOUNTER — Encounter: Payer: Self-pay | Admitting: Family Medicine

## 2022-11-17 ENCOUNTER — Ambulatory Visit (INDEPENDENT_AMBULATORY_CARE_PROVIDER_SITE_OTHER): Payer: PPO | Admitting: Family Medicine

## 2022-11-17 VITALS — BP 110/70 | HR 61 | Temp 98.6°F | Ht 73.0 in | Wt 212.4 lb

## 2022-11-17 DIAGNOSIS — Z0000 Encounter for general adult medical examination without abnormal findings: Secondary | ICD-10-CM | POA: Diagnosis not present

## 2022-11-17 DIAGNOSIS — Z125 Encounter for screening for malignant neoplasm of prostate: Secondary | ICD-10-CM

## 2022-11-17 DIAGNOSIS — Z79899 Other long term (current) drug therapy: Secondary | ICD-10-CM | POA: Diagnosis not present

## 2022-11-17 DIAGNOSIS — E1159 Type 2 diabetes mellitus with other circulatory complications: Secondary | ICD-10-CM | POA: Diagnosis not present

## 2022-11-17 DIAGNOSIS — J432 Centrilobular emphysema: Secondary | ICD-10-CM

## 2022-11-17 LAB — COMPREHENSIVE METABOLIC PANEL
ALT: 16 U/L (ref 0–53)
AST: 18 U/L (ref 0–37)
Albumin: 4.6 g/dL (ref 3.5–5.2)
Alkaline Phosphatase: 56 U/L (ref 39–117)
BUN: 15 mg/dL (ref 6–23)
CO2: 28 mEq/L (ref 19–32)
Calcium: 9.5 mg/dL (ref 8.4–10.5)
Chloride: 104 mEq/L (ref 96–112)
Creatinine, Ser: 1.02 mg/dL (ref 0.40–1.50)
GFR: 75.92 mL/min (ref >60.00)
Glucose, Bld: 101 mg/dL — ABNORMAL HIGH (ref 70–99)
Potassium: 4.7 mEq/L (ref 3.5–5.1)
Sodium: 141 mEq/L (ref 135–145)
Total Bilirubin: 0.6 mg/dL (ref 0.2–1.2)
Total Protein: 7.1 g/dL (ref 6.0–8.3)

## 2022-11-17 LAB — MICROALBUMIN / CREATININE URINE RATIO
Creatinine,U: 124.3 mg/dL
Microalb Creat Ratio: 1.2 mg/g (ref 0.0–30.0)
Microalb, Ur: 1.5 mg/dL (ref 0.0–1.9)

## 2022-11-17 LAB — CBC WITH DIFFERENTIAL/PLATELET
Basophils Absolute: 0 10*3/uL (ref 0.0–0.1)
Basophils Relative: 0.8 % (ref 0.0–3.0)
Eosinophils Absolute: 0.1 10*3/uL (ref 0.0–0.7)
Eosinophils Relative: 2.1 % (ref 0.0–5.0)
HCT: 41.5 % (ref 39.0–52.0)
Hemoglobin: 13.6 g/dL (ref 13.0–17.0)
Lymphocytes Relative: 25.3 % (ref 12.0–46.0)
Lymphs Abs: 1.6 10*3/uL (ref 0.7–4.0)
MCHC: 32.8 g/dL (ref 30.0–36.0)
MCV: 86.9 fl (ref 78.0–100.0)
Monocytes Absolute: 0.8 10*3/uL (ref 0.1–1.0)
Monocytes Relative: 12.5 % — ABNORMAL HIGH (ref 3.0–12.0)
Neutro Abs: 3.6 10*3/uL (ref 1.4–7.7)
Neutrophils Relative %: 59.3 % (ref 43.0–77.0)
Platelets: 224 10*3/uL (ref 150.0–400.0)
RBC: 4.78 Mil/uL (ref 4.22–5.81)
RDW: 16.5 % — ABNORMAL HIGH (ref 11.5–15.5)
WBC: 6.1 10*3/uL (ref 4.0–10.5)

## 2022-11-17 LAB — URINALYSIS, ROUTINE W REFLEX MICROSCOPIC
Bilirubin Urine: NEGATIVE
Hgb urine dipstick: NEGATIVE
Ketones, ur: NEGATIVE
Leukocytes,Ua: NEGATIVE
Nitrite: NEGATIVE
Specific Gravity, Urine: 1.015 (ref 1.000–1.030)
Total Protein, Urine: NEGATIVE
Urine Glucose: 100 — AB
Urobilinogen, UA: 1 (ref 0.0–1.0)
WBC, UA: NONE SEEN — AB (ref 0–?)
pH: 7 (ref 5.0–8.0)

## 2022-11-17 LAB — HEMOGLOBIN A1C: Hgb A1c MFr Bld: 6.6 % — ABNORMAL HIGH (ref 4.6–6.5)

## 2022-11-17 LAB — LIPID PANEL
Cholesterol: 115 mg/dL (ref 0–200)
HDL: 41.2 mg/dL (ref >39.00)
LDL Cholesterol: 57 mg/dL (ref 0–99)
NonHDL: 74.21
Total CHOL/HDL Ratio: 3
Triglycerides: 87 mg/dL (ref 0.0–149.0)
VLDL: 17.4 mg/dL (ref 0.0–40.0)

## 2022-11-17 LAB — VITAMIN B12: Vitamin B-12: 520 pg/mL (ref 211–911)

## 2022-11-17 LAB — PSA, MEDICARE: PSA: 5.87 ng/ml — ABNORMAL HIGH (ref 0.10–4.00)

## 2022-11-17 NOTE — Patient Instructions (Addendum)
Team requesting copy of eye exam  Team get date of recent Tetanus, Diphtheria, and Pertussis (Tdap) from him  - gets edema with this- recommended he trial half tablet for a week and then update me by mychart- depending on readings may have to go back up- if we want to do this logn term he wants [name] in the loop and we can message him  Please stop by lab before you go If you have mychart- we will send your results within 3 business days of [place] receiving them.  If you do not have mychart- we will call you about results within 5 business days of [place] receiving them.  *please also note that you will see labs on mychart as soon as they post. I will later go in and write notes on them- will say "notes from Dr. Durene Cal"   Recommended follow up: Return in about 5 months (around 04/19/2023) for followup or sooner if needed.Schedule b4 you leave.

## 2022-11-17 NOTE — Progress Notes (Signed)
Phone: [phone]   Subjective:  Patient presents today for their annual physical. Chief complaint-noted.   See problem oriented charting- ROS- full  review of systems was completed and negative  except for: ongoing hip pain and some skin sensitivity, bilateral pretibial edema (DVT scan 06/30/22 without DVT)  The following were reviewed and entered/updated in epic: Past Medical History:  Diagnosis Date   Anginal pain 2018   Anxiety    Arthritis    shoulders hips,    CAD S/P percutaneous coronary angioplasty 07/2016   a) 07/2016: 100% pCx -- DES PCI with 4.0 x 16 mm Synergy DES. 65% ostial OM 3 (relatively small caliber - Med Rx);; b) 03/27/18 PCI/DES to dLCX/OM3 x1. Patent pCX DES. 70% dRCA trifurcation lesion (Med Rx). Normal EF;; 07/2022: Patent Cx/LPAV Stents patent. ~mRCA 95%--> dRCA 100% (prior 90% bifurcation lesion). pLAD 55%, m-dLAD 45%. L-R Collaterals fill PAVPL & PDA.   COPD (chronic obstructive pulmonary disease) 2018   mild   DVT (deep venous thrombosis)    Right upper arm DVT on 2 occasions in the remote past   Dysrhythmia    V tac   Ejection fraction    LV function normal, echo, February, 2010   GERD (gastroesophageal reflux disease)    Hyperlipidemia    Hypertension    Mitral regurgitation    Mild, echo, February, 2010   Palpitations    Mild in the past   Peripheral vascular disease 1980s   DVT   Pre-diabetes    Statin intolerance    Felt poorly after Lipitor, Crestor, TriCor   STEMI involving left circumflex coronary artery 08/04/2016   Occluded very large caliber codominant circumflex - PCI with single Synergy DES   Tobacco abuse    Patient Active Problem List   Diagnosis Date Noted   Coronary artery disease involving native coronary artery of native heart with angina pectoris 09/21/2016    Priority: High   Insomnia 09/21/2016    Priority: High   Diabetes mellitus with cardiac complication 09/21/2016    Priority: High   STEMI involving left  circumflex coronary artery (HCC) -complicated by cardiac arrest 08/04/2016    Priority: High   History of DVT (deep vein thrombosis)     Priority: High   Former smoker     Priority: High   Centrilobular emphysema 09/04/2017    Priority: Medium    Ischemic cardiomyopathy 10/12/2016    Priority: Medium    Dyslipidemia, goal LDL below 50     Priority: Medium    Essential hypertension     Priority: Medium    Unstable angina     Priority: Low   Thoracic aortic atherosclerosis 09/04/2017    Priority: Low   Acute medial meniscus tear of right knee 04/04/2017    Priority: Low   GERD (gastroesophageal reflux disease)     Priority: Low   Ventricular bigeminy     Priority: Low   Frequent PVCs 09/01/2022   Abnormal nuclear stress test 08/30/2022   S/P total right hip arthroplasty 05/02/2022   COVID-19 virus infection 12/17/2020   Left hip OA 04/08/2020   S/P left THA, AA 04/08/2020   Chondromalacia, right knee 04/04/2017   Past Surgical History:  Procedure Laterality Date   CARDIAC CATHETERIZATION N/A 08/04/2016   Procedure: Left Heart Cath and Coronary Angiography;  Surgeon: Marykay Lex, MD;  Location: [facility] INVASIVE CV LAB;  Service: Cardiovascular;  Laterality: N/A: 100% pCx --> PCI. residual 65% pOM3 (small).  ~EF 35-45%  with lateral HK.   CARDIAC CATHETERIZATION N/A 08/04/2016   Procedure: CORONARY STENT INTERVENTION;  Surgeon: Marykay Lex, MD;  Location: [facility] INVASIVE CV LAB;  Service: Cardiovascular;  Laterality: N/A: pCx 100%-0%: Synergy DES 4.0 x 16 (4.6 mm)   CHONDROPLASTY Right 04/04/2017   Procedure: CHONDROPLASTY;  Surgeon: Jodi Geralds, MD;  Location: [facility];  Service: Orthopedics;  Laterality: Right;   CORONARY STENT INTERVENTION N/A 03/27/2018   Procedure: CORONARY STENT INTERVENTION;  Surgeon: Swazil[place], Peter M, MD;  Location: [facility] INVASIVE CV LAB;  Service: Cardiovascular:: dCx-OM3 95%-0%: DES PCI - Synergy 2.25 x 16 mm.   GANGLION CYST EXCISION     oct 2017   HERNIA  REPAIR     umbilical hernia 2017 oct   KNEE ARTHROSCOPY Right 04/04/2017   Procedure: ARTHROSCOPY RIGHT KNEE, WITH MEIDAL FEMORAL CONDYLE PATELLAR MEDIAL FEMORAL JOINT PLICA EXCISION ;  Surgeon: Jodi Geralds, MD;  Location: [facility];  Service: Orthopedics;  Laterality: Right;   LEFT HEART CATH AND CORONARY ANGIOGRAPHY N/A 03/27/2018   Procedure: LEFT HEART CATH AND CORONARY ANGIOGRAPHY;  Surgeon: Swaziland, Peter M, MD;  Location: [facility] INVASIVE CV LAB;  Service: Cardiovascular:   2 vessel obstructive CAD: - 95% dLCx/OM3 --Successful PCI of the dLCx/OM3 with SYNERGY DES 2.25X16, -70% distal RCA at trifurcation (Med Management).  Widely patent pLCx DES. Low normal LV function (50-55%), Normal LVEDP    LEFT HEART CATH AND CORONARY ANGIOGRAPHY N/A 08/30/2022   Procedure: LEFT HEART CATH AND CORONARY ANGIOGRAPHY;  Surgeon: Marykay Lex, MD;  Location: [facility] INVASIVE CV LAB;  Service: CV. ::pLAD 55%, m-d LAD 45%. pRCA-dRCA 95% (SubTO) -> dRCA 100% CTO prior to Bifurcation w/ 90-95% into PDA & PAV - both fill via L-R collaterals. pCx DES & LPAV stent patent.  No AS. LVEDP 16 mmHg.   NM MYOVIEW LTD  08/23/2022   INTERMEDIATE RISK.  EF~39%.  Infero-septal wall hypokinesis.  No ST segment changes.  Partially veritable medium sized, moderate severity defect in the mid to basal inferoseptal wall.; Considered Infarct w/ Peri-Infarct Ischemia. => Referred for Cath.   TOTAL HIP ARTHROPLASTY Left 04/08/2020   Procedure: TOTAL HIP ARTHROPLASTY ANTERIOR APPROACH;  Surgeon: Durene Romans, MD;  Location: [facility];  Service: Orthopedics;  Laterality: Left;  70 mins   TOTAL HIP ARTHROPLASTY Right 05/02/2022   Procedure: TOTAL HIP ARTHROPLASTY ANTERIOR APPROACH;  Surgeon: Durene Romans, MD;  Location: [facility];  Service: Orthopedics;  Laterality: Right;  90   TRANSTHORACIC ECHOCARDIOGRAM  08/22/2022   Normal LV Size &Fxn - EF ~ 55-65%. No RWMA. Mild Conc LVH with Severe LA dilation (but ? Normal Diastolic Fxn). Normal RV size & Fxn  with normal RVP & RAP. Normal MV & AoV.   TRANSTHORACIC ECHOCARDIOGRAM  01/11/2017   a) a) 07/2016: post STEMI: EF 40-45%. Mild concentric LVH. Severe HK of basal-mid inferolateral wall consistent with infarct in this distribution. GR 1 DD.;; b) 12/2016: EF 50-55%. GR 1 DD. Mild to moderate LA dilation.    Family History  Problem Relation Age of Onset   Heart attack Mother        x2- lived into 23s   Hypertension Father        lived into 36s   Other Brother        killed in [place]    Medications- reviewed and updated Current Outpatient Medications  Medication Sig Dispense Refill   acetaminophen (TYLENOL) 650 MG CR tablet Take 650-1,300 mg by mouth every 8 (eight) hours as  needed for pain.     amLODipine (NORVASC) 10 MG tablet Take 1 tablet (10 mg total) by mouth daily. 90 tablet 3   carvedilol (COREG) 12.5 MG tablet Take 1 tablet (12.5 mg total) by mouth 2 (two) times daily. 180 tablet 1   clopidogrel (PLAVIX) 75 MG tablet Take 1 tablet (75 mg total) by mouth daily. 90 tablet 3   LORazepam (ATIVAN) 0.5 MG tablet TAKE 1 TABLET BY MOUTH AT BEDTIME AS NEEDED FOR ANXIETY. DO not drive FOR EIGHT hours AFTER taking 90 tablet 1   nitroGLYCERIN (NITROSTAT) 0.4 MG SL tablet place 1 TABLET UNDER THE TONGUE EVERY 5 MINUTES AS NEEDED FOR CHEST PAIN 25 tablet 0   pantoprazole (PROTONIX) 40 MG tablet TAKE 1 TABLET BY MOUTH EVERY DAY 90 tablet 3   ranolazine (RANEXA) 500 MG 12 hr tablet Take 1 tablet (500 mg total) by mouth 2 (two) times daily. 180 tablet 3   rosuvastatin (CRESTOR) 40 MG tablet Take 1 tablet (40 mg total) by mouth daily. 90 tablet 3   triamcinolone lotion (KENALOG) 0.1 % Apply 1 application  topically 2 (two) times daily as needed (skin irritation). Mix lotion with cream and apply twice daily to affected area     No current facility-administered medications for this visit.    Allergies-reviewed and updated Allergies  Allergen Reactions   Metformin     After starting metformin-  Severe hip pain bilaterally worse on left. Could not turn neck. Back pain. Symptoms drastically improved off metformin    Social History   Social History Narrative   Married 10 years in 2018. 2 step kids- 26 and 28. 3 kids but he does not have contact with. No grandkids      Retired Comptroller.    Wife working [employer]      Hobbies: golf (needs shoulder replacement though, working in the yard, working out- was doing this regularly even before MI- walking/treadmill   Objective  Objective:  BP 110/70   Pulse 61   Temp 98.6 F (37 C) (Temporal)   Ht 6\' 1"  (1.854 m)   Wt 212 lb 6.4 oz (96.3 kg)   SpO2 98%   BMI 28.02 kg/m  Gen: NAD, resting comfortably HEENT: Mucous membranes are moist. Oropharynx normal Neck: no thyromegaly CV: RRR no murmurs rubs or gallops Lungs: CTAB no crackles, wheeze, rhonchi Abdomen: soft/nontender/nondistended/normal bowel sounds. No rebound or guarding.  Ext: trace to 1+ edema Skin: warm, dry Neuro: grossly normal, moves all extremities, PERRLA  Diabetic Foot Exam - Simple   Simple Foot Form Diabetic Foot exam was performed with the following findings: Yes 11/17/2022  8:55 AM  Visual Inspection No deformities, no ulcerations, no other skin breakdown bilaterally: Yes Sensation Testing Intact to touch and monofilament testing bilaterally: Yes Pulse Check Posterior Tibialis and Dorsalis pulse intact bilaterally: Yes Comments       Assessment and Plan  67 y.o. male presenting for annual physical.  Health Maintenance counseling: 1. Anticipatory guidance: Patient counseled regarding regular dental exams -q6 months, eye exams -typically every 2 to 3 years ,  avoiding smoking and second hand smoke , limiting alcohol to 2 beverages per day - 1-2 a day- encouraged less if possible with heart, no illicit drugs.   2. Risk factor reduction:  Advised patient of need for regular exercise and diet rich and fruits and vegetables to reduce risk of heart  attack and stroke.  Exercise- treadmill regularly.  Diet/weight management-weight up 1 pounds in  the last year- encouraged mild weigh tloss.  Wt Readings from Last 3 Encounters:  11/17/22 212 lb 6.4 oz (96.3 kg)  10/06/22 220 lb 6.4 oz (100 kg)  09/01/22 217 lb (98.4 kg)  3. Immunizations/screenings/ancillary studies-need copy of diabetic eye exam,  team requested, COVID shot - opts out . Reports had another Tetanus, Diphtheria, and Pertussis (Tdap) 2 weeks ago Immunization History  Administered Date(s) Administered   Fluad Quad(high Dose 65+) 04/05/2021, 07/05/2022   Influenza,inj,Quad PF,6+ Mos 04/28/2019   Influenza-Unspecified 05/17/2016, 05/02/2017, 04/15/2018, 04/28/2019, 05/07/2020   PFIZER(Purple Top)SARS-COV-2 Vaccination 08/19/2019, 09/09/2019, 05/10/2020   PNEUMOCOCCAL CONJUGATE-20 08/24/2021   Tdap 10/28/2015   Zoster Recombinat (Shingrix) 09/17/2017, 12/23/2017  4. Prostate cancer screening-  followed with urology in the past- we will update PSA as has not recently been seen and if worsens recommend referral back Lab Results  Component Value Date   PSA 4.02 (H) 02/21/2022   PSA 4.16 (H) 08/24/2021   PSA 3.82 02/09/2021   5. Colon cancer screening - colonoscopy 11/16/20 with 10 year repeat planned  6. Skin cancer screening-  yearly dermatology generally and if has issues sees sooner . advised regular sunscreen use. Denies worrisome, changing, or new skin lesions.  7. Smoking associated screening (lung cancer screening, AAA screen 65-75, UA)- former smoker-in lung cancer screening program , urines checked with urology in past but no recent visit- we will check. AAA screen discussed - opts out for now  8. STD screening - -declines-he is monogamous  Status of chronic or acute concerns   #CAD/hyperlipidemia/aortic atherosclerosis -[name] S: Patient with history of stents March 27, 2018 and August 04, 2016 with stent considered in 2024 but  Ranexa improved symptoms- no further chest pain.  Compliant with Plavix 75 mg and ranexa 500 mg q12 hours.   Lipids were controlled with LDL under 70 on rosuvastatin 20 mg-butDr. Herbie Baltimore would prefer to get LDL under 50-so increased rosuvastatin to 40 mg  Lab Results  Component Value Date   CHOL 136 04/19/2021   HDL 41.50 04/19/2021   LDLCALC 66 04/19/2021   LDLDIRECT 69.0 02/21/2022   TRIG 143.0 04/19/2021   CHOLHDL 3 04/19/2021  A/P: CAD asymptomatic continue current medications  Lipids    #hypertension S: compliant with carvedilol 12.5 mg twice daily, amlodipine 10 mg  BP Readings from Last 3 Encounters:  11/17/22 110/70  10/06/22 126/78  09/01/22 122/66  A/P: stable- continue current medicines - gets edema with this- recommended he trial half tablet for a week and then update me by mychart- depending on readings may have to go back up- if we want to do this logn term he wants 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 -COVID-19 pandemic is also significant stressor- better after vaccine -Ativan primarily for sleep 0.5 mg nightly-has racing thoughts before bed- discussed memory and fall risks long temr   A/P: reasonable control of anxiety/sleep issues with ativan- discussed risks but he wants to hold off on changes for now  -not interested in therapy   # Diabetes new 04/19/2021 S: Medication: diet controlled Lab Results  Component Value Date   HGBA1C 6.3 (H) 04/20/2022   HGBA1C 6.7 (H) 02/21/2022   HGBA1C 6.4 08/24/2021  A/P: hopefully stable- update a1c today. Continue without  meds for now   # GERD S:Medication:  pantoprazole 40 mg B12 levels related to PPI use:  no recent checks A/P: stable- continue current medicines  - B12 will be checked with long term proton pump inhibitor (PPI) stomach acid reducer    #COPD/centrilobular emphysema-noted on CT lung cancer screening.  No shortness of  breath.  Continue to monitor   #Right hip surgery- with Dr. Charlann Boxer last year  Recommended follow up: Return in about 5 months (around 04/19/2023) for followup or sooner if needed.Schedule b4 you leave. Future Appointments  Date Time Provider Department Center  12/19/2022  2:30 PM Marykay Lex, MD [facility] None  01/29/2023  1:30 PM Marykay Lex, MD [facility] None  03/08/2023 10:00 AM [facility] ANNUAL WELLNESS VISIT 1 [facility]   Lab/Order associations: fasting   ICD-10-CM   1. Preventative health care  Z00.00     2. Diabetes mellitus with cardiac complication  E11.59     3. Screening for prostate cancer  Z12.5     4. Centrilobular emphysema Chronic J43.2     5. High risk medication use  Z79.899       No orders of the defined types were placed in this encounter.   Return precautions advised.  Tana Conch, MD

## 2022-11-19 ENCOUNTER — Encounter: Payer: Self-pay | Admitting: Cardiology

## 2022-11-20 NOTE — Telephone Encounter (Signed)
Personally signed reduce back to 5 mg daily.  The reason for using amlodipine is is because it provides the antianginal benefit.  I would like to see if he can tolerate doing that.  The reduced dose should be less likely because swelling.  We could even go down to 2.5 mg.  We then may need to consider something else for blood pressure.  Bryan Lemma, MD

## 2022-11-21 ENCOUNTER — Other Ambulatory Visit: Payer: Self-pay | Admitting: Cardiology

## 2022-11-21 ENCOUNTER — Other Ambulatory Visit: Payer: Self-pay

## 2022-11-21 DIAGNOSIS — R972 Elevated prostate specific antigen [PSA]: Secondary | ICD-10-CM

## 2022-12-06 DIAGNOSIS — R972 Elevated prostate specific antigen [PSA]: Secondary | ICD-10-CM | POA: Diagnosis not present

## 2022-12-06 DIAGNOSIS — N400 Enlarged prostate without lower urinary tract symptoms: Secondary | ICD-10-CM | POA: Diagnosis not present

## 2022-12-19 ENCOUNTER — Ambulatory Visit: Payer: PPO | Attending: Cardiology | Admitting: Cardiology

## 2022-12-19 ENCOUNTER — Encounter: Payer: Self-pay | Admitting: Cardiology

## 2022-12-19 VITALS — BP 110/60 | HR 70 | Ht 73.0 in | Wt 211.0 lb

## 2022-12-19 DIAGNOSIS — E785 Hyperlipidemia, unspecified: Secondary | ICD-10-CM

## 2022-12-19 DIAGNOSIS — I255 Ischemic cardiomyopathy: Secondary | ICD-10-CM | POA: Diagnosis not present

## 2022-12-19 DIAGNOSIS — I498 Other specified cardiac arrhythmias: Secondary | ICD-10-CM

## 2022-12-19 DIAGNOSIS — I493 Ventricular premature depolarization: Secondary | ICD-10-CM | POA: Diagnosis not present

## 2022-12-19 DIAGNOSIS — I10 Essential (primary) hypertension: Secondary | ICD-10-CM

## 2022-12-19 DIAGNOSIS — I25119 Atherosclerotic heart disease of native coronary artery with unspecified angina pectoris: Secondary | ICD-10-CM | POA: Diagnosis not present

## 2022-12-19 NOTE — Patient Instructions (Addendum)
Medication Instructions:   No changes  *If you need a refill on your cardiac medications before your next appointment, please call your pharmacy*   Lab Work: Not needed    Testing/Procedures: Not needed   Follow-Up: At [facility], you and your health needs are our priority.  As part of our continuing mission to provide you with exceptional heart care, we have created designated Provider Care Teams.  These Care Teams include your primary Cardiologist (physician) and Advanced Practice Providers (APPs -  Physician Assistants and Nurse Practitioners) who all work together to provide you with the care you need, when you need it.     Your next appointment:   6 to 7 month(s)  The format for your next appointment:   In Person  Provider:   Bryan Lemma, MD

## 2022-12-19 NOTE — Progress Notes (Signed)
Primary Care Provider: Shelva Majestic, MD [facility] Cardiologist: Bryan Lemma, MD Electrophysiologist: None  Clinic Note: Chief Complaint  Patient presents with   Follow-up   Edema    Some in his feet and ankles.   Coronary Artery Disease    No active angina.  Getting at least 10,000 steps a day.   Palpitations    Much less frequent PVCs.    ===================================  ASSESSMENT/PLAN   Problem List Items Addressed This Visit       Cardiology Problems   Ventricular bigeminy (Chronic)    11% PVC burden with some couplets bigeminy.  He says symptomatically he is notably improved.  I think a lot of this was related to stress and anxiety.  Could also be ischemic.  Since he is feeling better now, just simply continue current dose carvedilol.      Ischemic cardiomyopathy (Chronic)    EF improved to 50 to 55% initially, now by most recent echo EF is 55 to 60%.  He clearly has inferolateral infarct and now probably been worsening inferior infarct territory that could set him up for arrhythmias.  He is on carvedilol and amlodipine for antianginal benefit.  Holding off on ARB unless blood pressure will tolerate.      Frequent PVCs - Primary    We did check PVC burden because of PVCs noted during heart catheterization.  The monitor did show 11.3% PVCs.  Thankfully though, he is pretty asymptomatic at this point on carvedilol.  He says a lot of this is probably related to anxiety.  For now we will continue to monitor and continue with increased dose of carvedilol 12.5 mg twice daily.      Relevant Orders   EKG 12-Lead (Completed)   Essential hypertension (Chronic)    BP doing well on the increased dose of carvedilol 12.5 mg twice daily and 5 mg amlodipine.      Dyslipidemia, goal LDL <55 (Chronic)    Back on rosuvastatin 40 mg daily, most recent LDL was 57.  Pretty close to goal. Tolerating well with no major myalgias.  Also, his A1c was 6.6 =>  will defer to PCP, but may want to consider SGLT2 inhibitor for cardioprotection and to help with edema      Coronary artery disease involving native coronary artery of native heart with angina pectoris (HCC) (Chronic)    Doing well now with no active anginal symptoms.  Is been several months since his That he is doing remarkably well.  I think a lot of his symptoms initially were related to anxiety.  At this point I do not think we need to consider CTO PCI of the RCA.  The collaterals are pretty brisk.  If he were to have recurrence of anginal symptoms, I think we may want to still consider the possibility of CTO PCI but for now we will hold off.  Plan:  Antianginal control: Continue carvedilol 12.5 mg twice daily along with amlodipine 5 mg daily in conjunction with Ranexa 500 mg twice daily. ->  This has his angina well-controlled. Continue 40 mg rosuvastatin.  Tolerating well. With 2 site PCI and RCA occlusion with requiring collateral flow, we will continue long-term Thienopyridine coverage with Plavix monotherapy Okay to hold Plavix 5 to 7 days preop for surgeries or procedures.      Relevant Orders   EKG 12-Lead (Completed)    ===================================  HPI:    Etienne Stedman Siever "CHUCK" is a 67 y.o. 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  Cardiac History: CRFs: HTN, HLD, Pre-DM, former smoker CAD: Inferior-posterior STEMI/VT arrest 08/06/2016 --> Shock x 2 for VT - ROSC -> CATH - 100% pCx - DES PCI (Synergy DES 4.0 x 16 - 4.6 mm)   Initial EF by LV gram was 35-40% - Increased to 40 and 45% by echo 2-D echo 01/11/2017: EF 50-55%. GR 1 DD. Mild to moderate LA dilation. Unstable Angina - Cath 03/27/2018: patent pCx stent --> Cx-OM2 95% (Culprit) -- DES PCI. ~70% dRCA trifurcation (med Rx). Abn EKG Jan 2024 -> Cath, reviewed below -> Diffuse mid-distal RCA-> CTO, fills via L-R collaterals from LAD-septals &  dLCx.  Both LCx stents patent. Moderate pLDA 55% & mid LAD 45%.  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    I recently saw Forrestine Him on September 01, 2018 for follow-up after his cardiac catheterization that basely revealed occluded RCA left-to-right collaterals filling the distal branches.  Recommendation was medical management and consider CTO PCI if symptoms warrant.  He was started on Ranexa (500 mg twice daily) along with amlodipine up to 10 mg daily and carvedilol 12.5 mg twice daily. => He was doing well since his cardiac catheterization.  He had just gotten somewhat concerned because he was initially told stress test was normal and he was going to the gym and working out 2 days a week.  No further chest pain.  He had really only had that 1 episode of chest pain leading to his evaluation by Tereso Newcomer, PA that ended up having him go for cath.  Catheter films reviewed.  Multiple questions asked and answered.  He noted increased palpitations, usually associated with stress or anxiety.  The only pertinent factor was that he is not yet active with exercise. 7-day Zio patch reordered to assess PVC burden for symptomatic PVCs and ventricular bigeminy.   Plan : close follow-up with APP to discuss symptoms to see if CTO PCI be warranted.  I saw Edd Fabian, NP on October 06, 2022 having just contacted the office about having some lower extremity swelling related to amlodipine.  Recommendation was to elevate feet, wear compression stockings.  He continued to be asymptomatic as far as any chest pain or pressure.  Slight ankle swelling but improved ->Reiterated the importance of support stockings for travel.  Noted increased level of anxiety related to his cardiac condition.  Taking extra dose of the lorazepam at night to help with sleeping.  Concerned that his PCP would not continue.  Otherwise able to do activities during the day without any  activity.  Still working out at [employer]  In fact, exercising takes his mind off of his CAD.  Healthy diet discussed.  No recurrent angina.  Only occasional palpitations.  Recent Hospitalizations: None  Reviewed  CV studies:    The following studies were reviewed today: (if available, images/films reviewed: From Epic Chart or Care Everywhere)  7-day Zio patch February 2024:   Predominant rhythm sinus rhythm with 1  AVB.  Heart rate 57 to 117 bpm with an average of 73 bpm.   Rare PACs; PVCs (11.3%).PVCs couplets and triplets as well as bigeminy noted.   5 short bursts of 4 beat PVC runs (technically considered ventricular tachycardia) heart rate ranged from 114 with 150 bpm.   2 atrial runs-558 with heart rate range of 121 and 98 bpm.   Symptoms  are noted with PVCs admits quite a bit of them.  This is definitely something that we want to look into potentially controlling PVCs => we talked about potentially switching from carvedilol to Toprol which we can discuss at follow-up visit -> looks like he is scheduled to come back in July.  Would like to see if we can probably have him come back maybe late May or early Jun:  Interval History:   Forrestine Him - "Terry Wright" returns here today for follow-up indicated that he is doing really well.  He says the palpitations are really calm down as his anxiety is calm down.  He did decrease the amlodipine to 5 mg daily NSL for the swelling.  Taking the lorazepam at night to help with sleep but has really helped.  He does not really use it during the day for anxiety, but getting enough sleep has really helped.  Exercising also helps.  As he is been able to stay calm, he has not had as much of the palpitation symptoms.  He said that when he was noticing that palpitations back then he was under a lot of stress and was very concerned about having had a heart catheterization.  It really caught him off guard that he initially had heard a stress test was  normal and then was referred for catheterization.  Thankfully, he not only is more active but he is not having any anginal symptoms with rest or exertion.  In fact he feels better with exercise.  He is doing at least 10,000 steps a day and exercising routinely.  No exertional dyspnea no chest pain.  No PND orthopnea, but does have some edema.  Thankfully has improved with reduced dose of amlodipine.  Reducing amlodipine did not change his chest pain.  Overall, Terry Wright is doing quite well, and tolerating medications well.  CV Review of Symptoms (Summary):  no chest pain or dyspnea on exertion positive for - palpitations and makes a much less frequent and much less worrisome to him.  Likely related to less anxiety/stress.  End of day edema but is much better. negative for - orthopnea, paroxysmal nocturnal dyspnea, rapid heart rate, shortness of breath, or syncope/near syncope or TIA/amaurosis fugax, claudication  REVIEWED OF SYSTEMS   Review of Systems  Constitutional:  Negative for malaise/fatigue and weight loss.  HENT:  Negative for nosebleeds.   Respiratory:  Negative for shortness of breath.   Gastrointestinal:  Negative for blood in stool and melena.  Genitourinary:  Negative for hematuria.  Musculoskeletal:  Positive for joint pain (Hips are still bothering him a bit, but doing a lot better.).  Neurological:  Negative for dizziness and headaches.  Endo/Heme/Allergies:  Does not bruise/bleed easily.  Psychiatric/Behavioral:         He still has some PTSD and anxiety issues, but is doing much better.    I have reviewed and (if needed) personally updated the patient's problem list, medications, allergies, past medical and surgical history, social and family history.   PAST MEDICAL HISTORY   Reviewed in EPIC -> pertinent history above  PAST SURGICAL HISTORY   REVIEWED IN EPIC Cardiac Cath 08/30/2022: pLAD 55%, m-d LAD 45%. pRCA-dRCA 95% (SubTO) -> dRCA 100% CTO prior to Bifurcation w/  90-95% into PDA & PAV - both fill via L-R collaterals. pCx DES & LPAV stent patent.  No AS. LVEDP 16 mmHg.  Frequent PVCs w/ bigeminy  .Dominance: Right Images were reviewed with Dr. Peter Swaziland who felt that this  would be potentially suitable for CTO PCI, but if this was an option it would be felt that should be sooner rather than later to avoid potential full occlusion of the long tapered segment of the RCA.   MEDICATIONS/ALLERGIES   Current Meds  Medication Sig   acetaminophen (TYLENOL) 650 MG CR tablet Take 650-1,300 mg by mouth every 8 (eight) hours as needed for pain.   amLODipine (NORVASC) 10 MG tablet Take 1 tablet (10 mg total) by mouth daily. (Patient taking differently: Take 5 mg by mouth daily.)   carvedilol (COREG) 12.5 MG tablet Take 1 tablet (12.5 mg total) by mouth 2 (two) times daily.   clopidogrel (PLAVIX) 75 MG tablet TAKE 1 TABLET BY MOUTH DAILY   LORazepam (ATIVAN) 0.5 MG tablet TAKE 1 TABLET BY MOUTH AT BEDTIME AS NEEDED FOR ANXIETY. DO not drive FOR EIGHT hours AFTER taking   nitroGLYCERIN (NITROSTAT) 0.4 MG SL tablet place 1 TABLET UNDER THE TONGUE EVERY 5 MINUTES AS NEEDED FOR CHEST PAIN   pantoprazole (PROTONIX) 40 MG tablet TAKE 1 TABLET BY MOUTH EVERY DAY   ranolazine (RANEXA) 500 MG 12 hr tablet Take 1 tablet (500 mg total) by mouth 2 (two) times daily.   rosuvastatin (CRESTOR) 40 MG tablet Take 1 tablet (40 mg total) by mouth daily.   triamcinolone lotion (KENALOG) 0.1 % Apply 1 application  topically 2 (two) times daily as needed (skin irritation). Mix lotion with cream and apply twice daily to affected area    Allergies  Allergen Reactions   Metformin     After starting metformin- Severe hip pain bilaterally worse on left. Could not turn neck. Back pain. Symptoms drastically improved off metformin    SOCIAL HISTORY/FAMILY HISTORY   Reviewed in Epic:  Pertinent findings: Former smoker-43 pack years.  Quit in 2015.  Usually 1 glass of wine a day  Social  History   Social History Narrative   Married 10 years in 2018. 2 step kids- 26 and 28. 3 kids but he does not have contact with. No grandkids      Retired Comptroller.    Wife working [employer]      Hobbies: golf (needs shoulder replacement though, working in the yard, working out- was doing this regularly even before MI- walking/treadmill    OBJCTIVE -PE, EKG, labs   Wt Readings from Last 3 Encounters:  12/19/22 211 lb (95.7 kg)  11/17/22 212 lb 6.4 oz (96.3 kg)  10/06/22 220 lb 6.4 oz (100 kg)    Physical Exam: BP 110/60 (BP Location: Left Arm, Patient Position: Sitting, Cuff Size: Normal)   Pulse 70   Ht 6\' 1"  (1.854 m)   Wt 211 lb (95.7 kg)   BMI 27.84 kg/m  Physical Exam Vitals reviewed.  Constitutional:      General: He is not in acute distress.    Appearance: Normal appearance. He is normal weight. He is not ill-appearing or toxic-appearing.     Comments: Healthy-appearing.  Well-groomed.  Well-nourished.  HENT:     Head: Normocephalic and atraumatic.  Neck:     Vascular: No carotid bruit.  Cardiovascular:     Rate and Rhythm: Normal rate and regular rhythm. Occasional Extrasystoles are present.    Chest Wall: PMI is not displaced.     Pulses: Normal pulses.     Heart sounds: Normal heart sounds, S1 normal and S2 normal. No murmur heard.    No friction rub. No gallop.  Pulmonary:  Effort: Pulmonary effort is normal. No respiratory distress.     Breath sounds: Normal breath sounds. No wheezing, rhonchi or rales.  Chest:     Chest wall: No tenderness.  Musculoskeletal:        General: No swelling. Normal range of motion.     Cervical back: Normal range of motion and neck supple.  Skin:    General: Skin is warm and dry.  Neurological:     General: No focal deficit present.     Mental Status: He is alert and oriented to person, place, and time. Mental status is at baseline.  Psychiatric:        Mood and Affect: Mood normal.        Behavior:  Behavior normal.        Thought Content: Thought content normal.        Judgment: Judgment normal.     Comments: Less emotional today.  In good spirits.     Adult ECG Report  Rate: 70 ;  Rhythm: normal sinus rhythm and 1  AVB ; normal axis, normal durations.  Narrative Interpretation: Normal.  Recent Labs: Reviewed Lab Results  Component Value Date   CHOL 115 11/17/2022   HDL 41.20 11/17/2022   LDLCALC 57 11/17/2022   LDLDIRECT 69.0 02/21/2022   TRIG 87.0 11/17/2022   CHOLHDL 3 11/17/2022   Lab Results  Component Value Date   CREATININE 1.02 11/17/2022   BUN 15 11/17/2022   NA 141 11/17/2022   K 4.7 11/17/2022   CL 104 11/17/2022   CO2 28 11/17/2022      Latest Ref Rng & Units 11/17/2022    9:48 AM 08/25/2022   12:34 PM 08/13/2022    9:52 PM  CBC  WBC 4.0 - 10.5 K/uL 6.1  10.0  11.3   Hemoglobin 13.0 - 17.0 g/dL 16.1  09.6  04.5   Hematocrit 39.0 - 52.0 % 41.5  42.8  38.0   Platelets 150.0 - 400.0 K/uL 224.0  236  225     Lab Results  Component Value Date   HGBA1C 6.6 (H) 11/17/2022   No results found for: "TSH"  ================================================== I spent a total of 30 minutes with the patient spent in direct patient consultation.  Additional time spent with chart review  / charting (studies, outside notes, etc): 19 min Total Time: 49 min  Current medicines are reviewed at length with the patient today.  (+/- concerns) N/A  Notice: This dictation was prepared with Dragon dictation along with smart phrase technology. Any transcriptional errors that result from this process are unintentional and may not be corrected upon review.  Studies Ordered:   Orders Placed This Encounter  Procedures   EKG 12-Lead   No orders of the defined types were placed in this encounter.   Patient Instructions / Medication Changes & Studies & Tests Ordered   Patient Instructions  Medication Instructions:   No changes  *If you need a refill on your cardiac  medications before your next appointment, please call your pharmacy*   Lab Work: Not needed    Testing/Procedures: Not needed   Follow-Up: At [facility], you and your health needs are our priority.  As part of our continuing mission to provide you with exceptional heart care, we have created designated Provider Care Teams.  These Care Teams include your primary Cardiologist (physician) and Advanced Practice Providers (APPs -  Physician Assistants and Nurse Practitioners) who all work together to provide you with the care  you need, when you need it.     Your next appointment:   6 to 7 month(s)  The format for your next appointment:   In Person  Provider:   Bryan Lemma, MD         Marykay Lex, MD, MS Bryan Lemma, M.D., M.S. Interventional Cardiologist  [facility]  Pager # [phone] Phone # [phone] [address] [place], [place] [address]   Thank you for choosing  [facility] [place]!!

## 2022-12-31 ENCOUNTER — Encounter: Payer: Self-pay | Admitting: Cardiology

## 2022-12-31 NOTE — Assessment & Plan Note (Addendum)
Back on rosuvastatin 40 mg daily, most recent LDL was 57.  Pretty close to goal. Tolerating well with no major myalgias.  Also, his A1c was 6.6 => will defer to PCP, but may want to consider SGLT2 inhibitor for cardioprotection and to help with edema

## 2022-12-31 NOTE — Assessment & Plan Note (Signed)
Doing well now with no active anginal symptoms.  Is been several months since his That he is doing remarkably well.  I think a lot of his symptoms initially were related to anxiety.  At this point I do not think we need to consider CTO PCI of the RCA.  The collaterals are pretty brisk.  If he were to have recurrence of anginal symptoms, I think we may want to still consider the possibility of CTO PCI but for now we will hold off.  Plan:  Antianginal control: Continue carvedilol 12.5 mg twice daily along with amlodipine 5 mg daily in conjunction with Ranexa 500 mg twice daily. ->  This has his angina well-controlled. Continue 40 mg rosuvastatin.  Tolerating well. With 2 site PCI and RCA occlusion with requiring collateral flow, we will continue long-term Thienopyridine coverage with Plavix monotherapy Okay to hold Plavix 5 to 7 days preop for surgeries or procedures.

## 2022-12-31 NOTE — Assessment & Plan Note (Signed)
BP doing well on the increased dose of carvedilol 12.5 mg twice daily and 5 mg amlodipine.

## 2022-12-31 NOTE — Assessment & Plan Note (Signed)
EF improved to 50 to 55% initially, now by most recent echo EF is 55 to 60%.  He clearly has inferolateral infarct and now probably been worsening inferior infarct territory that could set him up for arrhythmias.  He is on carvedilol and amlodipine for antianginal benefit.  Holding off on ARB unless blood pressure will tolerate.

## 2022-12-31 NOTE — Assessment & Plan Note (Signed)
11% PVC burden with some couplets bigeminy.  He says symptomatically he is notably improved.  I think a lot of this was related to stress and anxiety.  Could also be ischemic.  Since he is feeling better now, just simply continue current dose carvedilol.

## 2022-12-31 NOTE — Assessment & Plan Note (Signed)
We did check PVC burden because of PVCs noted during heart catheterization.  The monitor did show 11.3% PVCs.  Thankfully though, he is pretty asymptomatic at this point on carvedilol.  He says a lot of this is probably related to anxiety.  For now we will continue to monitor and continue with increased dose of carvedilol 12.5 mg twice daily.

## 2023-01-16 DIAGNOSIS — L570 Actinic keratosis: Secondary | ICD-10-CM | POA: Diagnosis not present

## 2023-01-16 DIAGNOSIS — L821 Other seborrheic keratosis: Secondary | ICD-10-CM | POA: Diagnosis not present

## 2023-01-22 ENCOUNTER — Other Ambulatory Visit: Payer: Self-pay | Admitting: *Deleted

## 2023-01-22 MED ORDER — CARVEDILOL 12.5 MG PO TABS: 12.5000 mg | ORAL_TABLET | Freq: Two times a day (BID) | ORAL | 1 refills | Status: DC

## 2023-01-29 ENCOUNTER — Ambulatory Visit: Payer: PPO | Admitting: Cardiology

## 2023-02-12 ENCOUNTER — Other Ambulatory Visit: Payer: Self-pay | Admitting: Cardiology

## 2023-03-06 DIAGNOSIS — M19012 Primary osteoarthritis, left shoulder: Secondary | ICD-10-CM | POA: Diagnosis not present

## 2023-03-07 ENCOUNTER — Telehealth: Payer: Self-pay

## 2023-03-07 NOTE — Telephone Encounter (Signed)
   Pre-operative Risk Assessment    Patient Name: Terry Wright  DOB: 1956-05-16 MRN: [id number]     Request for Surgical Clearance    Procedure:  Left reverse shoulder arthroplasty   Date of Surgery:  Clearance TBD                                 Surgeon:  Dr. Francena Hanly  Surgeon's Group or Practice Name:  [facility] Phone number:  [phone] Fax number:  [phone] Attn: Aida Raider    Type of Clearance Requested:   - Pharmacy:  Hold Clopidogrel (Plavix)     Type of Anesthesia:  General    Additional requests/questions:    Scarlette Shorts   03/07/2023, 2:58 PM

## 2023-03-08 ENCOUNTER — Ambulatory Visit (INDEPENDENT_AMBULATORY_CARE_PROVIDER_SITE_OTHER): Payer: PPO

## 2023-03-08 ENCOUNTER — Telehealth: Payer: Self-pay | Admitting: *Deleted

## 2023-03-08 VITALS — Wt 211.0 lb

## 2023-03-08 DIAGNOSIS — Z0000 Encounter for general adult medical examination without abnormal findings: Secondary | ICD-10-CM | POA: Diagnosis not present

## 2023-03-08 NOTE — Telephone Encounter (Signed)
  Patient Consent for Virtual Visit        Terry Wright has provided verbal consent on 03/08/2023 for a virtual visit (video or telephone).   CONSENT FOR VIRTUAL VISIT FOR:  Terry Wright  By participating in this virtual visit I agree to the following:  I hereby voluntarily request, consent and authorize [facility] and its employed or contracted physicians, physician assistants, nurse practitioners or other licensed health care professionals (the Practitioner), to provide me with telemedicine health care services (the "Services") as deemed necessary by the treating Practitioner. I acknowledge and consent to receive the Services by the Practitioner via telemedicine. I understand that the telemedicine visit will involve communicating with the Practitioner through live audiovisual communication technology and the disclosure of certain medical information by electronic transmission. I acknowledge that I have been given the opportunity to request an in-person assessment or other available alternative prior to the telemedicine visit and am voluntarily participating in the telemedicine visit.  I understand that I have the right to withhold or withdraw my consent to the use of telemedicine in the course of my care at any time, without affecting my right to future care or treatment, and that the Practitioner or I may terminate the telemedicine visit at any time. I understand that I have the right to inspect all information obtained and/or recorded in the course of the telemedicine visit and may receive copies of available information for a reasonable fee.  I understand that some of the potential risks of receiving the Services via telemedicine include:  Delay or interruption in medical evaluation due to technological equipment failure or disruption; Information transmitted may not be sufficient (e.g. poor resolution of images) to allow for appropriate medical decision making by  the Practitioner; and/or  In rare instances, security protocols could fail, causing a breach of personal health information.  Furthermore, I acknowledge that it is my responsibility to provide information about my medical history, conditions and care that is complete and accurate to the best of my ability. I acknowledge that Practitioner's advice, recommendations, and/or decision may be based on factors not within their control, such as incomplete or inaccurate data provided by me or distortions of diagnostic images or specimens that may result from electronic transmissions. I understand that the practice of medicine is not an exact science and that Practitioner makes no warranties or guarantees regarding treatment outcomes. I acknowledge that a copy of this consent can be made available to me via my patient portal [facility] MyChart), or I can request a printed copy by calling the office of [facility].    I understand that my insurance will be billed for this visit.   I have read or had this consent read to me. I understand the contents of this consent, which adequately explains the benefits and risks of the Services being provided via telemedicine.  I have been provided ample opportunity to ask questions regarding this consent and the Services and have had my questions answered to my satisfaction. I give my informed consent for the services to be provided through the use of telemedicine in my medical care

## 2023-03-08 NOTE — Progress Notes (Signed)
Subjective:   Terry Wright is a 67 y.o. male who presents for Medicare Annual/Subsequent preventive examination.  Visit Complete: Virtual  I connected with  Terry Wright on 03/08/23 by a audio enabled telemedicine application and verified that I am speaking with the correct person using two identifiers.  Patient Location: Home  Provider Location: Office/Clinic  I discussed the limitations of evaluation and management by telemedicine. The patient expressed understanding and agreed to proceed.  Patient Medicare AWV questionnaire was completed by the patient on 03/01/23; I have confirmed that all information answered by patient is correct and no changes since this date.   Vital Signs: Unable to obtain new vitals due to this being a telehealth visit.   Review of Systems     Cardiac Risk Factors include: diabetes mellitus;advanced age (>8men, >15 women);dyslipidemia;hypertension;male gender     Objective:    Today's Vitals   03/08/23 1002  Weight: 211 lb (95.7 kg)   Body mass index is 27.84 kg/m.     03/08/2023   10:07 AM 08/30/2022    7:23 AM 08/13/2022    9:40 PM 05/08/2022    7:19 PM 05/02/2022    7:30 PM 04/20/2022    1:04 PM 02/23/2022    9:40 AM  Advanced Directives  Does Patient Have a Medical Advance Directive? Yes No No No Yes Yes Yes  Type of Estate agent of [place];Living will    Healthcare Power of [place];Living will Healthcare Power of [place];Living will Healthcare Power of Attorney  Does patient want to make changes to medical advance directive?     No - Patient declined    Copy of Healthcare Power of Attorney in Chart? No - copy requested    No - copy requested No - copy requested No - copy requested  Would patient like information on creating a medical advance directive?  No - Patient declined No - Patient declined        Current Medications (verified) Outpatient Encounter Medications as of 03/08/2023  Medication  Sig   acetaminophen (TYLENOL) 650 MG CR tablet Take 650-1,300 mg by mouth every 8 (eight) hours as needed for pain.   amLODipine (NORVASC) 10 MG tablet Take 1 tablet (10 mg total) by mouth daily. (Patient taking differently: Take 5 mg by mouth daily.)   carvedilol (COREG) 12.5 MG tablet Take 1 tablet (12.5 mg total) by mouth 2 (two) times daily.   clopidogrel (PLAVIX) 75 MG tablet TAKE 1 TABLET BY MOUTH DAILY   LORazepam (ATIVAN) 0.5 MG tablet TAKE 1 TABLET BY MOUTH AT BEDTIME AS NEEDED FOR ANXIETY. DO not drive FOR EIGHT hours AFTER taking   nitroGLYCERIN (NITROSTAT) 0.4 MG SL tablet place 1 TABLET UNDER THE TONGUE EVERY 5 MINUTES AS NEEDED FOR CHEST PAIN   pantoprazole (PROTONIX) 40 MG tablet TAKE 1 TABLET BY MOUTH EVERY DAY   ranolazine (RANEXA) 500 MG 12 hr tablet Take 1 tablet (500 mg total) by mouth 2 (two) times daily.   rosuvastatin (CRESTOR) 40 MG tablet Take 1 tablet (40 mg total) by mouth daily.   triamcinolone lotion (KENALOG) 0.1 % Apply 1 application  topically 2 (two) times daily as needed (skin irritation). Mix lotion with cream and apply twice daily to affected area   No facility-administered encounter medications on file as of 03/08/2023.    Allergies (verified) Metformin   History: Past Medical History:  Diagnosis Date   Anginal pain (HCC) 2018   Anxiety    Arthritis  shoulders hips,    CAD S/P percutaneous coronary angioplasty 07/2016   a) 07/2016: 100% pCx -- DES PCI with 4.0 x 16 mm Synergy DES. 65% ostial OM 3 (relatively small caliber - Med Rx);; b) 03/27/18 PCI/DES to dLCX/OM3 x1. Patent pCX DES. 70% dRCA trifurcation lesion (Med Rx). Normal EF;; 07/2022: Patent Cx/LPAV Stents patent. ~mRCA 95%--> dRCA 100% (prior 90% bifurcation lesion). pLAD 55%, m-dLAD 45%. L-R Collaterals fill PAVPL & PDA.   COPD (chronic obstructive pulmonary disease) (HCC) 2018   mild   DVT (deep venous thrombosis) (HCC)    Right upper arm DVT on 2 occasions in the remote past   Dysrhythmia     V tac   Ejection fraction    LV function normal, echo, February, 2010   GERD (gastroesophageal reflux disease)    History of tobacco abuse 2015   43-pack-year history   Hyperlipidemia    Hypertension    Mitral regurgitation    Mild, echo, February, 2010   Palpitations    Mild in the past   Peripheral vascular disease (HCC) 1980s   DVT   Pre-diabetes    Statin intolerance    Felt poorly after Lipitor, Crestor, TriCor   STEMI involving left circumflex coronary artery (HCC) 08/04/2016   Occluded very large caliber codominant circumflex - PCI with single Synergy DES   Past Surgical History:  Procedure Laterality Date   CARDIAC CATHETERIZATION N/A 08/04/2016   Procedure: Left Heart Cath and Coronary Angiography;  Surgeon: Marykay Lex, MD;  Location: [facility] INVASIVE CV LAB;  Service: Cardiovascular;  Laterality: N/A: 100% pCx --> PCI. residual 65% pOM3 (small).  ~EF 35-45%  with lateral HK.   CARDIAC CATHETERIZATION N/A 08/04/2016   Procedure: CORONARY STENT INTERVENTION;  Surgeon: Marykay Lex, MD;  Location: [facility] INVASIVE CV LAB;  Service: Cardiovascular;  Laterality: N/A: pCx 100%-0%: Synergy DES 4.0 x 16 (4.6 mm)   CHONDROPLASTY Right 04/04/2017   Procedure: CHONDROPLASTY;  Surgeon: Jodi Geralds, MD;  Location: [facility];  Service: Orthopedics;  Laterality: Right;   CORONARY STENT INTERVENTION N/A 03/27/2018   Procedure: CORONARY STENT INTERVENTION;  Surgeon: Swaz[place], Peter M, MD;  Location: [facility] INVASIVE CV LAB;  Service: Cardiovascular:: dCx-OM3 95%-0%: DES PCI - Synergy 2.25 x 16 mm.   GANGLION CYST EXCISION     oct 2017   HERNIA REPAIR     umbilical hernia 2017 oct   KNEE ARTHROSCOPY Right 04/04/2017   Procedure: ARTHROSCOPY RIGHT KNEE, WITH MEIDAL FEMORAL CONDYLE PATELLAR MEDIAL FEMORAL JOINT PLICA EXCISION ;  Surgeon: Jodi Geralds, MD;  Location: [facility];  Service: Orthopedics;  Laterality: Right;   LEFT HEART CATH AND CORONARY ANGIOGRAPHY N/A 03/27/2018   Procedure: LEFT HEART  CATH AND CORONARY ANGIOGRAPHY;  Surgeon: Swazil[place], Peter M, MD;  Location: [facility] INVASIVE CV LAB;  Service: Cardiovascular:   2 vessel obstructive CAD: - 95% dLCx/OM3 --Successful PCI of the dLCx/OM3 with SYNERGY DES 2.25X16, -70% distal RCA at trifurcation (Med Management).  Widely patent pLCx DES. Low normal LV function (50-55%), Normal LVEDP    LEFT HEART CATH AND CORONARY ANGIOGRAPHY N/A 08/30/2022   Procedure: LEFT HEART CATH AND CORONARY ANGIOGRAPHY;  Surgeon: Marykay Lex, MD;  Location: [facility] INVASIVE CV LAB;  Service: CV. ::pLAD 55%, m-d LAD 45%. pRCA-dRCA 95% (SubTO) -> dRCA 100% CTO prior to Bifurcation w/ 90-95% into PDA & PAV - both fill via L-R collaterals. pCx DES & LPAV stent patent.  No AS. LVEDP 16 mmHg.   NM MYOVIEW LTD  08/23/2022   INTERMEDIATE RISK.  EF~39%.  Infero-septal wall hypokinesis.  No ST segment changes.  Partially veritable medium sized, moderate severity defect in the mid to basal inferoseptal wall.; Considered Infarct w/ Peri-Infarct Ischemia. => Referred for Cath.   TOTAL HIP ARTHROPLASTY Left 04/08/2020   Procedure: TOTAL HIP ARTHROPLASTY ANTERIOR APPROACH;  Surgeon: Durene Romans, MD;  Location: [facility];  Service: Orthopedics;  Laterality: Left;  70 mins   TOTAL HIP ARTHROPLASTY Right 05/02/2022   Procedure: TOTAL HIP ARTHROPLASTY ANTERIOR APPROACH;  Surgeon: Durene Romans, MD;  Location: [facility];  Service: Orthopedics;  Laterality: Right;  90   TRANSTHORACIC ECHOCARDIOGRAM  08/22/2022   Normal LV Size &Fxn - EF ~ 55-65%. No RWMA. Mild Conc LVH with Severe LA dilation (but ? Normal Diastolic Fxn). Normal RV size & Fxn with normal RVP & RAP. Normal MV & AoV.   TRANSTHORACIC ECHOCARDIOGRAM  01/11/2017   a) a) 07/2016: post STEMI: EF 40-45%. Mild concentric LVH. Severe HK of basal-mid inferolateral wall consistent with infarct in this distribution. GR 1 DD.;; b) 12/2016: EF 50-55%. GR 1 DD. Mild to moderate LA dilation.   Family History  Problem Relation Age of Onset    Heart attack Mother        x2- lived into 62s   Hypertension Father        lived into 54s   Other Brother        killed in [place]   Social History   Socioeconomic History   Marital status: Married    Spouse name: Larita Fife   Number of children: Not on file   Years of education: 12   Highest education level: High school graduate  Occupational History   Not on file  Tobacco Use   Smoking status: Former    Current packs/day: 0.00    Average packs/day: 1 pack/day for 43.0 years (43.0 ttl pk-yrs)    Types: Cigarettes    Start date: 42    Quit date: 2015    Years since quitting: 9.6   Smokeless tobacco: Never   Tobacco comments:    Former smoker quit 2015  Vaping Use   Vaping status: Never Used  Substance and Sexual Activity   Alcohol use: Yes    Alcohol/week: 7.0 standard drinks of alcohol    Types: 7 Glasses of wine per week    Comment: occasional   Drug use: No   Sexual activity: Not on file  Other Topics Concern   Not on file  Social History Narrative   Married 10 years in 2018. 2 step kids- 26 and 28. 3 kids but he does not have contact with. No grandkids      Retired Comptroller.    Wife working [place]      Hobbies: golf (needs shoulder replacement though, working in the yard, working out- was doing this regularly even before MI- walking/treadmill   Social Determinants of Corporate investment banker Strain: Low Risk  (03/01/2023)   Overall Financial Resource Strain (CARDIA)    Difficulty of Paying Living Expenses: Not very hard  Food Insecurity: No Food Insecurity (03/01/2023)   Hunger Vital Sign    Worried About Running Out of Food in the Last Year: Never true    Ran Out of Food in the Last Year: Never true  Transportation Needs: No Transportation Needs (03/01/2023)   PRAPARE - Administrator, Civil Service (Medical): No    Lack of Transportation (Non-Medical): No  Physical  Activity: Sufficiently Active (03/01/2023)   Exercise Vital Sign     Days of Exercise per Week: 6 days    Minutes of Exercise per Session: 50 min  Stress: Stress Concern Present (03/01/2023)   [employer] of Occupational Health - Occupational Stress Questionnaire    Feeling of Stress : To some extent  Social Connections: Moderately Integrated (03/01/2023)   Social Connection and Isolation Panel [NHANES]    Frequency of Communication with Friends and Family: More than three times a week    Frequency of Social Gatherings with Friends and Family: More than three times a week    Attends Religious Services: Never    Database administrator or Organizations: Yes    Attends Engineer, structural: More than 4 times per year    Marital Status: Married    Tobacco Counseling Counseling given: Not Answered Tobacco comments: Former smoker quit 2015   Clinical Intake:  Pre-visit preparation completed: Yes  Pain : No/denies pain     BMI - recorded: 27.84 Nutritional Status: BMI 25 -29 Overweight Nutritional Risks: None Diabetes: Yes CBG done?: No Did pt. bring in CBG monitor from home?: No  How often do you need to have someone help you when you read instructions, pamphlets, or other written materials from your doctor or pharmacy?: 1 - Never  Interpreter Needed?: No  Information entered by :: Lanier Ensign, LPN   Activities of Daily Living    03/01/2023   10:00 AM 05/02/2022    7:32 PM  In your present state of health, do you have any difficulty performing the following activities:  Hearing? 0   Vision? 0   Difficulty concentrating or making decisions? 0   Walking or climbing stairs? 0   Dressing or bathing? 0   Doing errands, shopping? 0 0  Preparing Food and eating ? N   Using the Toilet? N   In the past six months, have you accidently leaked urine? N   Do you have problems with loss of bowel control? N   Managing your Medications? N   Managing your Finances? N   Housekeeping or managing your Housekeeping? N     Patient Care  Team: Shelva Majestic, MD as PCP - General (Family Medicine) Marykay Lex, MD as PCP - Cardiology (Cardiology) Jodi Geralds, MD as Consulting Physician (Orthopedic Surgery) Sharrell Ku, MD as Consulting Physician (Gastroenterology)  Indicate any recent Medical Services you may have received from other than [facility] providers in the past year (date may be approximate).     Assessment:   This is a routine wellness examination for [place].  Hearing/Vision screen Hearing Screening - Comments:: Pt denies hearing issues  Vision Screening - Comments:: Pt follows up with Dr Dione Booze for annual eye exams   Dietary issues and exercise activities discussed:     Goals Addressed             This Visit's Progress    Patient Stated       Get shoulder replaced        Depression Screen    03/08/2023   10:08 AM 11/17/2022    8:24 AM 02/23/2022    9:38 AM 08/24/2021   10:51 AM 03/15/2020    2:30 PM 09/16/2019    3:52 PM 05/01/2019   11:33 AM  PHQ 2/9 Scores  PHQ - 2 Score 0 0 0 0 0 0 0  PHQ- 9 Score     0  Fall Risk    03/01/2023   10:00 AM 11/17/2022    8:24 AM 02/23/2022    9:40 AM 04/05/2021    9:55 AM 03/15/2020    2:30 PM  Fall Risk   Falls in the past year? 0 0 0 0 0  Number falls in past yr:  0 0 0 0  Injury with Fall?  0 0 0 0  Risk for fall due to : Impaired vision No Fall Risks Impaired vision No Fall Risks   Follow up Falls prevention discussed Falls evaluation completed Falls prevention discussed Falls evaluation completed     MEDICARE RISK AT HOME:   TIMED UP AND GO:  Was the test performed?  No    Cognitive Function:        03/08/2023   10:10 AM 02/23/2022    9:42 AM  6CIT Screen  What Year? 0 points 0 points  What month? 0 points 0 points  What time? 0 points 0 points  Count back from 20 0 points 0 points  Months in reverse 4 points 4 points  Repeat phrase 2 points 0 points  Total Score 6 points 4 points    Immunizations Immunization History   Administered Date(s) Administered   Fluad Quad(high Dose 65+) 04/05/2021, 07/05/2022   Influenza,inj,Quad PF,6+ Mos 04/28/2019   Influenza-Unspecified 05/17/2016, 05/02/2017, 04/15/2018, 04/28/2019, 05/07/2020   PFIZER(Purple Top)SARS-COV-2 Vaccination 08/19/2019, 09/09/2019, 05/10/2020   PNEUMOCOCCAL CONJUGATE-20 08/24/2021   Tdap 10/28/2015   Zoster Recombinant(Shingrix) 09/17/2017, 12/23/2017    TDAP status: Up to date  Flu Vaccine status: Due, Education has been provided regarding the importance of this vaccine. Advised may receive this vaccine at local pharmacy or Health Dept. Aware to provide a copy of the vaccination record if obtained from local pharmacy or Health Dept. Verbalized acceptance and understanding.  Pneumococcal vaccine status: Up to date  Covid-19 vaccine status: Completed vaccines  Qualifies for Shingles Vaccine? Yes   Zostavax completed Yes   Shingrix Completed?: Yes  Screening Tests Health Maintenance  Topic Date Due   INFLUENZA VACCINE  03/01/2023   HEMOGLOBIN A1C  05/19/2023   Lung Cancer Screening  08/04/2023   OPHTHALMOLOGY EXAM  10/03/2023   Diabetic kidney evaluation - eGFR measurement  11/17/2023   Diabetic kidney evaluation - Urine ACR  11/17/2023   FOOT EXAM  11/17/2023   Medicare Annual Wellness (AWV)  03/07/2024   DTaP/Tdap/Td (2 - Td or Tdap) 10/27/2025   Colonoscopy  11/17/2030   Pneumonia Vaccine 39+ Years old  Completed   Hepatitis C Screening  Completed   Zoster Vaccines- Shingrix  Completed   HPV VACCINES  Aged Out   COVID-19 Vaccine  Discontinued    Health Maintenance  Health Maintenance Due  Topic Date Due   INFLUENZA VACCINE  03/01/2023    Colorectal cancer screening: Type of screening: Colonoscopy. Completed 11/16/20. Repeat every 10 years  Lung Cancer Screening: (Low Dose CT Chest recommended if Age 4-80 years, 20 pack-year currently smoking OR have quit w/in 15years.) does qualify.   Lung Cancer Screening Referral:  order placed 08/07/22 pt stated usually completes in November   Additional Screening:  Hepatitis C Screening: Completed 01/25/17  Vision Screening: Recommended annual ophthalmology exams for early detection of glaucoma and other disorders of the eye. Is the patient up to date with their annual eye exam?  Yes  Who is the provider or what is the name of the office in which the patient attends annual eye exams? Dr Dione Booze  If  pt is not established with a provider, would they like to be referred to a provider to establish care? No .   Dental Screening: Recommended annual dental exams for proper oral hygiene    Community Resource Referral / Chronic Care Management: CRR required this visit?  No   CCM required this visit?  No     Plan:     I have personally reviewed and noted the following in the patient's chart:   Medical and social history Use of alcohol, tobacco or illicit drugs  Current medications and supplements including opioid prescriptions. Patient is not currently taking opioid prescriptions. Functional ability and status Nutritional status Physical activity Advanced directives List of other physicians Hospitalizations, surgeries, and ER visits in previous 12 months Vitals Screenings to include cognitive, depression, and falls Referrals and appointments  In addition, I have reviewed and discussed with patient certain preventive protocols, quality metrics, and best practice recommendations. A written personalized care plan for preventive services as well as general preventive health recommendations were provided to patient.     Marzella Schlein, LPN   4/0/9811   After Visit Summary: (MyChart) Due to this being a telephonic visit, the after visit summary with patients personalized plan was offered to patient via MyChart   Nurse Notes: none

## 2023-03-08 NOTE — Patient Instructions (Signed)
Terry Wright , Thank you for taking time to come for your Medicare Wellness Visit. I appreciate your ongoing commitment to your health goals. Please review the following plan we discussed and let me know if I can assist you in the future.   Referrals/Orders/Follow-Ups/Clinician Recommendations: get through shoulder surgery   This is a list of the screening recommended for you and due dates:  Health Maintenance  Topic Date Due   Flu Shot  03/01/2023   Hemoglobin A1C  05/19/2023   Screening for Lung Cancer  08/04/2023   Eye exam for diabetics  10/03/2023   Yearly kidney function blood test for diabetes  11/17/2023   Yearly kidney health urinalysis for diabetes  11/17/2023   Complete foot exam   11/17/2023   Medicare Annual Wellness Visit  03/07/2024   DTaP/Tdap/Td vaccine (2 - Td or Tdap) 10/27/2025   Colon Cancer Screening  11/17/2030   Pneumonia Vaccine  Completed   Hepatitis C Screening  Completed   Zoster (Shingles) Vaccine  Completed   HPV Vaccine  Aged Out   COVID-19 Vaccine  Discontinued    Advanced directives: (Copy Requested) Please bring a copy of your health care power of attorney and living will to the office to be added to your chart at your convenience.  Next Medicare Annual Wellness Visit scheduled for next year: Yes  Preventive Care 43 Years and Older, Male  Preventive care refers to lifestyle choices and visits with your health care provider that can promote health and wellness. What does preventive care include? A yearly physical exam. This is also called an annual well check. Dental exams once or twice a year. Routine eye exams. Ask your health care provider how often you should have your eyes checked. Personal lifestyle choices, including: Daily care of your teeth and gums. Regular physical activity. Eating a healthy diet. Avoiding tobacco and drug use. Limiting alcohol use. Practicing safe sex. Taking low doses of aspirin every day. Taking vitamin and  mineral supplements as recommended by your health care provider. What happens during an annual well check? The services and screenings done by your health care provider during your annual well check will depend on your age, overall health, lifestyle risk factors, and family history of disease. Counseling  Your health care provider may ask you questions about your: Alcohol use. Tobacco use. Drug use. Emotional well-being. Home and relationship well-being. Sexual activity. Eating habits. History of falls. Memory and ability to understand (cognition). Work and work Astronomer. Screening  You may have the following tests or measurements: Height, weight, and BMI. Blood pressure. Lipid and cholesterol levels. These may be checked every 5 years, or more frequently if you are over 97 years old. Skin check. Lung cancer screening. You may have this screening every year starting at age 51 if you have a 30-pack-year history of smoking and currently smoke or have quit within the past 15 years. Fecal occult blood test (FOBT) of the stool. You may have this test every year starting at age 59. Flexible sigmoidoscopy or colonoscopy. You may have a sigmoidoscopy every 5 years or a colonoscopy every 10 years starting at age 73. Prostate cancer screening. Recommendations will vary depending on your family history and other risks. Hepatitis C blood test. Hepatitis B blood test. Sexually transmitted disease (STD) testing. Diabetes screening. This is done by checking your blood sugar (glucose) after you have not eaten for a while (fasting). You may have this done every 1-3 years. Abdominal aortic aneurysm (AAA) screening.  You may need this if you are a current or former smoker. Osteoporosis. You may be screened starting at age 22 if you are at high risk. Talk with your health care provider about your test results, treatment options, and if necessary, the need for more tests. Vaccines  Your health care  provider may recommend certain vaccines, such as: Influenza vaccine. This is recommended every year. Tetanus, diphtheria, and acellular pertussis (Tdap, Td) vaccine. You may need a Td booster every 10 years. Zoster vaccine. You may need this after age 64. Pneumococcal 13-valent conjugate (PCV13) vaccine. One dose is recommended after age 3. Pneumococcal polysaccharide (PPSV23) vaccine. One dose is recommended after age 21. Talk to your health care provider about which screenings and vaccines you need and how often you need them. This information is not intended to replace advice given to you by your health care provider. Make sure you discuss any questions you have with your health care provider. Document Released: 08/13/2015 Document Revised: 04/05/2016 Document Reviewed: 05/18/2015 Elsevier Interactive Patient Education  2017 [employer].  Fall Prevention in the Home Falls can cause injuries. They can happen to people of all ages. There are many things you can do to make your home safe and to help prevent falls. What can I do on the outside of my home? Regularly fix the edges of walkways and driveways and fix any cracks. Remove anything that might make you trip as you walk through a door, such as a raised step or threshold. Trim any bushes or trees on the path to your home. Use bright outdoor lighting. Clear any walking paths of anything that might make someone trip, such as rocks or tools. Regularly check to see if handrails are loose or broken. Make sure that both sides of any steps have handrails. Any raised decks and porches should have guardrails on the edges. Have any leaves, snow, or ice cleared regularly. Use sand or salt on walking paths during winter. Clean up any spills in your garage right away. This includes oil or grease spills. What can I do in the bathroom? Use night lights. Install grab bars by the toilet and in the tub and shower. Do not use towel bars as grab  bars. Use non-skid mats or decals in the tub or shower. If you need to sit down in the shower, use a plastic, non-slip stool. Keep the floor dry. Clean up any water that spills on the floor as soon as it happens. Remove soap buildup in the tub or shower regularly. Attach bath mats securely with double-sided non-slip rug tape. Do not have throw rugs and other things on the floor that can make you trip. What can I do in the bedroom? Use night lights. Make sure that you have a light by your bed that is easy to reach. Do not use any sheets or blankets that are too big for your bed. They should not hang down onto the floor. Have a firm chair that has side arms. You can use this for support while you get dressed. Do not have throw rugs and other things on the floor that can make you trip. What can I do in the kitchen? Clean up any spills right away. Avoid walking on wet floors. Keep items that you use a lot in easy-to-reach places. If you need to reach something above you, use a strong step stool that has a grab bar. Keep electrical cords out of the way. Do not use floor polish or wax  that makes floors slippery. If you must use wax, use non-skid floor wax. Do not have throw rugs and other things on the floor that can make you trip. What can I do with my stairs? Do not leave any items on the stairs. Make sure that there are handrails on both sides of the stairs and use them. Fix handrails that are broken or loose. Make sure that handrails are as long as the stairways. Check any carpeting to make sure that it is firmly attached to the stairs. Fix any carpet that is loose or worn. Avoid having throw rugs at the top or bottom of the stairs. If you do have throw rugs, attach them to the floor with carpet tape. Make sure that you have a light switch at the top of the stairs and the bottom of the stairs. If you do not have them, ask someone to add them for you. What else can I do to help prevent  falls? Wear shoes that: Do not have high heels. Have rubber bottoms. Are comfortable and fit you well. Are closed at the toe. Do not wear sandals. If you use a stepladder: Make sure that it is fully opened. Do not climb a closed stepladder. Make sure that both sides of the stepladder are locked into place. Ask someone to hold it for you, if possible. Clearly mark and make sure that you can see: Any grab bars or handrails. First and last steps. Where the edge of each step is. Use tools that help you move around (mobility aids) if they are needed. These include: Canes. Walkers. Scooters. Crutches. Turn on the lights when you go into a dark area. Replace any light bulbs as soon as they burn out. Set up your furniture so you have a clear path. Avoid moving your furniture around. If any of your floors are uneven, fix them. If there are any pets around you, be aware of where they are. Review your medicines with your doctor. Some medicines can make you feel dizzy. This can increase your chance of falling. Ask your doctor what other things that you can do to help prevent falls. This information is not intended to replace advice given to you by your health care provider. Make sure you discuss any questions you have with your health care provider. Document Released: 05/13/2009 Document Revised: 12/23/2015 Document Reviewed: 08/21/2014 Elsevier Interactive Patient Education  2017 [employer].

## 2023-03-08 NOTE — Telephone Encounter (Signed)
Spoke with patient and scheduled him for a pre op telehealth visit on 03/22/23 at 9:20 AM. Will route to requesting surgeons office to make them aware.

## 2023-03-08 NOTE — Telephone Encounter (Signed)
   Name: Marciano Mega  DOB: [birth year]  MRN: [id number]  Primary Cardiologist: Bryan Lemma, MD   Preoperative team, please contact this patient and set up a phone call appointment for further preoperative risk assessment. Please obtain consent and complete medication review. Thank you for your help.  I confirm that guidance regarding antiplatelet and oral anticoagulation therapy has been completed and, if necessary, noted below.  Plavix monotherapy Per Dr. Marcie Mowers to hold Plavix 5 to 7 days preop for surgeries or procedures.   Joni Reining, NP 03/08/2023, 8:52 AM [facility]

## 2023-03-15 ENCOUNTER — Encounter (INDEPENDENT_AMBULATORY_CARE_PROVIDER_SITE_OTHER): Payer: Self-pay

## 2023-03-22 ENCOUNTER — Ambulatory Visit: Payer: PPO | Attending: Cardiovascular Disease

## 2023-03-22 DIAGNOSIS — Z01810 Encounter for preprocedural cardiovascular examination: Secondary | ICD-10-CM

## 2023-03-22 NOTE — Progress Notes (Signed)
Virtual Visit via Telephone Note   Because of Terry Wright's co-morbid illnesses, he is at least at moderate risk for complications without adequate follow up.  This format is felt to be most appropriate for this patient at this time.  The patient did not have access to video technology/had technical difficulties with video requiring transitioning to audio format only (telephone).  All issues noted in this document were discussed and addressed.  No physical exam could be performed with this format.  Please refer to the patient's chart for his consent to telehealth for [facility].  Evaluation Performed:  Preoperative cardiovascular risk assessment _____________   Date:  03/22/2023   Patient ID:  Terry Wright, DOB Jul 21, 1956, MRN [id number] Patient Location:  Home Provider location:   Office  Primary Care Provider:  Shelva Majestic, MD Primary Cardiologist:  Bryan Lemma, MD  Chief Complaint / Patient Profile   67 y.o. y/o male with a h/o CAD s/p inferior posterior STEMI with VT arrest and PCI to proximal circumflex, HLD, HTN who is pending left reverse shoulder arthroplasty and presents today for telephonic preoperative cardiovascular risk assessment.  History of Present Illness    Terry Wright is a 67 y.o. male who presents via audio/video conferencing for a telehealth visit today.  Pt was last seen in cardiology clinic on 12/19/2022 by [name].  At that time Khary Sielski [place]more was doing well with with improvement to anxiety felt that chest pain was related to anxiety.The patient is now pending procedure as outlined above. Since his last visit, he has been doing well with no new cardiac complaints or chest pain episodes.  He is compliant with his medications and denies any adverse reactions.  He denies chest pain, shortness of breath, lower extremity edema, fatigue, palpitations, melena, hematuria, hemoptysis, diaphoresis, weakness,  presyncope, syncope, orthopnea, and PND.    Past Medical History    Past Medical History:  Diagnosis Date   Anginal pain (HCC) 2018   Anxiety    Arthritis    shoulders hips,    CAD S/P percutaneous coronary angioplasty 07/2016   a) 07/2016: 100% pCx -- DES PCI with 4.0 x 16 mm Synergy DES. 65% ostial OM 3 (relatively small caliber - Med Rx);; b) 03/27/18 PCI/DES to dLCX/OM3 x1. Patent pCX DES. 70% dRCA trifurcation lesion (Med Rx). Normal EF;; 07/2022: Patent Cx/LPAV Stents patent. ~mRCA 95%--> dRCA 100% (prior 90% bifurcation lesion). pLAD 55%, m-dLAD 45%. L-R Collaterals fill PAVPL & PDA.   COPD (chronic obstructive pulmonary disease) (HCC) 2018   mild   DVT (deep venous thrombosis) (HCC)    Right upper arm DVT on 2 occasions in the remote past   Dysrhythmia    V tac   Ejection fraction    LV function normal, echo, February, 2010   GERD (gastroesophageal reflux disease)    History of tobacco abuse 2015   43-pack-year history   Hyperlipidemia    Hypertension    Mitral regurgitation    Mild, echo, February, 2010   Palpitations    Mild in the past   Peripheral vascular disease (HCC) 1980s   DVT   Pre-diabetes    Statin intolerance    Felt poorly after Lipitor, Crestor, TriCor   STEMI involving left circumflex coronary artery (HCC) 08/04/2016   Occluded very large caliber codominant circumflex - PCI with single Synergy DES   Past Surgical History:  Procedure Laterality Date   CARDIAC CATHETERIZATION N/A 08/04/2016   Procedure: Left  Heart Cath and Coronary Angiography;  Surgeon: Marykay Lex, MD;  Location: [facility] INVASIVE CV LAB;  Service: Cardiovascular;  Laterality: N/A: 100% pCx --> PCI. residual 65% pOM3 (small).  ~EF 35-45%  with lateral HK.   CARDIAC CATHETERIZATION N/A 08/04/2016   Procedure: CORONARY STENT INTERVENTION;  Surgeon: Marykay Lex, MD;  Location: [facility] INVASIVE CV LAB;  Service: Cardiovascular;  Laterality: N/A: pCx 100%-0%: Synergy DES 4.0 x 16 (4.6 mm)    CHONDROPLASTY Right 04/04/2017   Procedure: CHONDROPLASTY;  Surgeon: Jodi Geralds, MD;  Location: [facility] ORS;  Service: Orthopedics;  Laterality: Right;   CORONARY STENT INTERVENTION N/A 03/27/2018   Procedure: CORONARY STENT INTERVENTION;  Surgeon: Swaziland, Peter M, MD;  Location: [facility] INVASIVE CV LAB;  Service: Cardiovascular:: dCx-OM3 95%-0%: DES PCI - Synergy 2.25 x 16 mm.   GANGLION CYST EXCISION     oct 2017   HERNIA REPAIR     umbilical hernia 2017 oct   KNEE ARTHROSCOPY Right 04/04/2017   Procedure: ARTHROSCOPY RIGHT KNEE, WITH MEIDAL FEMORAL CONDYLE PATELLAR MEDIAL FEMORAL JOINT PLICA EXCISION ;  Surgeon: Jodi Geralds, MD;  Location: [facility];  Service: Orthopedics;  Laterality: Right;   LEFT HEART CATH AND CORONARY ANGIOGRAPHY N/A 03/27/2018   Procedure: LEFT HEART CATH AND CORONARY ANGIOGRAPHY;  Surgeon: Swaziland, Peter M, MD;  Location: [facility] INVASIVE CV LAB;  Service: Cardiovascular:   2 vessel obstructive CAD: - 95% dLCx/OM3 --Successful PCI of the dLCx/OM3 with SYNERGY DES 2.25X16, -70% distal RCA at trifurcation (Med Management).  Widely patent pLCx DES. Low normal LV function (50-55%), Normal LVEDP    LEFT HEART CATH AND CORONARY ANGIOGRAPHY N/A 08/30/2022   Procedure: LEFT HEART CATH AND CORONARY ANGIOGRAPHY;  Surgeon: Marykay Lex, MD;  Location: [facility] INVASIVE CV LAB;  Service: CV. ::pLAD 55%, m-d LAD 45%. pRCA-dRCA 95% (SubTO) -> dRCA 100% CTO prior to Bifurcation w/ 90-95% into PDA & PAV - both fill via L-R collaterals. pCx DES & LPAV stent patent.  No AS. LVEDP 16 mmHg.   NM MYOVIEW LTD  08/23/2022   INTERMEDIATE RISK.  EF~39%.  Infero-septal wall hypokinesis.  No ST segment changes.  Partially veritable medium sized, moderate severity defect in the mid to basal inferoseptal wall.; Considered Infarct w/ Peri-Infarct Ischemia. => Referred for Cath.   TOTAL HIP ARTHROPLASTY Left 04/08/2020   Procedure: TOTAL HIP ARTHROPLASTY ANTERIOR APPROACH;  Surgeon: Durene Romans, MD;  Location: [facility];   Service: Orthopedics;  Laterality: Left;  70 mins   TOTAL HIP ARTHROPLASTY Right 05/02/2022   Procedure: TOTAL HIP ARTHROPLASTY ANTERIOR APPROACH;  Surgeon: Durene Romans, MD;  Location: [facility];  Service: Orthopedics;  Laterality: Right;  90   TRANSTHORACIC ECHOCARDIOGRAM  08/22/2022   Normal LV Size &Fxn - EF ~ 55-65%. No RWMA. Mild Conc LVH with Severe LA dilation (but ? Normal Diastolic Fxn). Normal RV size & Fxn with normal RVP & RAP. Normal MV & AoV.   TRANSTHORACIC ECHOCARDIOGRAM  01/11/2017   a) a) 07/2016: post STEMI: EF 40-45%. Mild concentric LVH. Severe HK of basal-mid inferolateral wall consistent with infarct in this distribution. GR 1 DD.;; b) 12/2016: EF 50-55%. GR 1 DD. Mild to moderate LA dilation.    Allergies  Allergies  Allergen Reactions   Metformin     After starting metformin- Severe hip pain bilaterally worse on left. Could not turn neck. Back pain. Symptoms drastically improved off metformin    Home Medications    Prior to Admission medications   Medication Sig  Start Date End Date Taking? Authorizing Provider  acetaminophen (TYLENOL) 650 MG CR tablet Take 650-1,300 mg by mouth every 8 (eight) hours as needed for pain.    [provider]  amLODipine (NORVASC) 10 MG tablet Take 1 tablet (10 mg total) by mouth daily. Patient taking differently: Take 5 mg by mouth daily. 10/02/22   Marykay Lex, MD  carvedilol (COREG) 12.5 MG tablet Take 1 tablet (12.5 mg total) by mouth 2 (two) times daily. 01/22/23   Marykay Lex, MD  clopidogrel (PLAVIX) 75 MG tablet TAKE 1 TABLET BY MOUTH DAILY 02/12/23   Marykay Lex, MD  LORazepam (ATIVAN) 0.5 MG tablet TAKE 1 TABLET BY MOUTH AT BEDTIME AS NEEDED FOR ANXIETY. DO not drive FOR EIGHT hours AFTER taking 10/16/22   Shelva Majestic, MD  nitroGLYCERIN (NITROSTAT) 0.4 MG SL tablet place 1 TABLET UNDER THE TONGUE EVERY 5 MINUTES AS NEEDED FOR CHEST PAIN 08/25/22   Shelva Majestic, MD  pantoprazole (PROTONIX) 40 MG  tablet TAKE 1 TABLET BY MOUTH EVERY DAY 08/01/22   Marykay Lex, MD  ranolazine (RANEXA) 500 MG 12 hr tablet Take 1 tablet (500 mg total) by mouth 2 (two) times daily. 08/30/22   Marykay Lex, MD  rosuvastatin (CRESTOR) 40 MG tablet Take 1 tablet (40 mg total) by mouth daily. 04/07/22   Shelva Majestic, MD  triamcinolone lotion (KENALOG) 0.1 % Apply 1 application  topically 2 (two) times daily as needed (skin irritation). Mix lotion with cream and apply twice daily to affected area    [provider]    Physical Exam    Vital Signs:  Geary Puzio does not have vital signs available for review today.  Given telephonic nature of communication, physical exam is limited. AAOx3. NAD. Normal affect.  Speech and respirations are unlabored.  Accessory Clinical Findings    None  Assessment & Plan    1.  Preoperative Cardiovascular Risk Assessment: -Patient's RCRI score is 6.6%  The patient affirms he has been doing well without any new cardiac symptoms. They are able to achieve 7 METS without cardiac limitations. Therefore, based on ACC/AHA guidelines, the patient would be at acceptable risk for the planned procedure without further cardiovascular testing. The patient was advised that if he develops new symptoms prior to surgery to contact our office to arrange for a follow-up visit, and he verbalized understanding.   The patient was advised that if he develops new symptoms prior to surgery to contact our office to arrange for a follow-up visit, and he verbalized understanding.  -Patient can hold Plavix 5 to 7 days prior to procedure and should restart postprocedure when surgically safe and hemostasis is achieved.  A copy of this note will be routed to requesting surgeon.  Time:   Today, I have spent 8 minutes with the patient with telehealth technology discussing medical history, symptoms, and management plan.     Napoleon Form, Leodis Rains, NP  03/22/2023, 7:11 AM

## 2023-04-09 ENCOUNTER — Other Ambulatory Visit: Payer: Self-pay | Admitting: Family Medicine

## 2023-04-20 ENCOUNTER — Ambulatory Visit (INDEPENDENT_AMBULATORY_CARE_PROVIDER_SITE_OTHER): Payer: PPO | Admitting: Family Medicine

## 2023-04-20 ENCOUNTER — Other Ambulatory Visit: Payer: Self-pay | Admitting: Family Medicine

## 2023-04-20 ENCOUNTER — Encounter: Payer: Self-pay | Admitting: Family Medicine

## 2023-04-20 ENCOUNTER — Other Ambulatory Visit: Payer: Self-pay | Admitting: Cardiology

## 2023-04-20 VITALS — BP 120/70 | HR 57 | Temp 98.0°F | Ht 73.0 in | Wt 210.0 lb

## 2023-04-20 DIAGNOSIS — I10 Essential (primary) hypertension: Secondary | ICD-10-CM | POA: Diagnosis not present

## 2023-04-20 DIAGNOSIS — E1159 Type 2 diabetes mellitus with other circulatory complications: Secondary | ICD-10-CM

## 2023-04-20 DIAGNOSIS — Z23 Encounter for immunization: Secondary | ICD-10-CM

## 2023-04-20 DIAGNOSIS — E785 Hyperlipidemia, unspecified: Secondary | ICD-10-CM | POA: Diagnosis not present

## 2023-04-20 LAB — CBC WITH DIFFERENTIAL/PLATELET
Basophils Absolute: 0 10*3/uL (ref 0.0–0.1)
Basophils Relative: 0.7 % (ref 0.0–3.0)
Eosinophils Absolute: 0.2 10*3/uL (ref 0.0–0.7)
Eosinophils Relative: 2.7 % (ref 0.0–5.0)
HCT: 41.1 % (ref 39.0–52.0)
Hemoglobin: 13.4 g/dL (ref 13.0–17.0)
Lymphocytes Relative: 27.8 % (ref 12.0–46.0)
Lymphs Abs: 1.8 10*3/uL (ref 0.7–4.0)
MCHC: 32.7 g/dL (ref 30.0–36.0)
MCV: 86.4 fl (ref 78.0–100.0)
Monocytes Absolute: 0.8 10*3/uL (ref 0.1–1.0)
Monocytes Relative: 12.8 % — ABNORMAL HIGH (ref 3.0–12.0)
Neutro Abs: 3.7 10*3/uL (ref 1.4–7.7)
Neutrophils Relative %: 56 % (ref 43.0–77.0)
Platelets: 223 10*3/uL (ref 150.0–400.0)
RBC: 4.75 Mil/uL (ref 4.22–5.81)
RDW: 15.3 % (ref 11.5–15.5)
WBC: 6.6 10*3/uL (ref 4.0–10.5)

## 2023-04-20 LAB — COMPREHENSIVE METABOLIC PANEL
ALT: 14 U/L (ref 0–53)
AST: 15 U/L (ref 0–37)
Albumin: 4.4 g/dL (ref 3.5–5.2)
Alkaline Phosphatase: 50 U/L (ref 39–117)
BUN: 23 mg/dL (ref 6–23)
CO2: 27 mEq/L (ref 19–32)
Calcium: 9.5 mg/dL (ref 8.4–10.5)
Chloride: 105 mEq/L (ref 96–112)
Creatinine, Ser: 0.99 mg/dL (ref 0.40–1.50)
GFR: 78.46 mL/min (ref >60.00)
Glucose, Bld: 97 mg/dL (ref 70–99)
Potassium: 4 mEq/L (ref 3.5–5.1)
Sodium: 140 mEq/L (ref 135–145)
Total Bilirubin: 0.6 mg/dL (ref 0.2–1.2)
Total Protein: 7.1 g/dL (ref 6.0–8.3)

## 2023-04-20 LAB — HEMOGLOBIN A1C: Hgb A1c MFr Bld: 6.5 % (ref 4.6–6.5)

## 2023-04-20 LAB — LDL CHOLESTEROL, DIRECT: Direct LDL: 62 mg/dL

## 2023-04-20 NOTE — Progress Notes (Signed)
Phone [phone] In person visit   Subjective:   Terry Wright is a 67 y.o. year old very pleasant male patient who presents for/with See problem oriented charting Chief Complaint  Patient presents with   Medical Management of Chronic Issues    DEE requested   Hypertension    Past Medical History-  Patient Active Problem List   Diagnosis Date Noted   Coronary artery disease involving native coronary artery of native heart with angina pectoris (HCC) 09/21/2016    Priority: High   Insomnia 09/21/2016    Priority: High   Diabetes mellitus with cardiac complication (HCC) 09/21/2016    Priority: High   STEMI involving left circumflex coronary artery (HCC) -complicated by cardiac arrest 08/04/2016    Priority: High   History of DVT (deep vein thrombosis)     Priority: High   Former smoker     Priority: High   Centrilobular emphysema (HCC) 09/04/2017    Priority: Medium    Ischemic cardiomyopathy 10/12/2016    Priority: Medium    Dyslipidemia, goal LDL <55     Priority: Medium    Essential hypertension     Priority: Medium    Unstable angina (HCC)     Priority: Low   Thoracic aortic atherosclerosis (HCC) 09/04/2017    Priority: Low   Acute medial meniscus tear of right knee 04/04/2017    Priority: Low   GERD (gastroesophageal reflux disease)     Priority: Low   Ventricular bigeminy     Priority: Low   Frequent PVCs 09/01/2022   Abnormal nuclear stress test 08/30/2022   S/P total right hip arthroplasty 05/02/2022   COVID-19 virus infection 12/17/2020   Left hip OA 04/08/2020   S/P left THA, AA 04/08/2020   Chondromalacia, right knee 04/04/2017    Medications- reviewed and updated Current Outpatient Medications  Medication Sig Dispense Refill   acetaminophen (TYLENOL) 650 MG CR tablet Take 650-1,300 mg by mouth every 8 (eight) hours as needed for pain.     amLODipine (NORVASC) 10 MG tablet Take 1 tablet (10 mg total) by mouth daily. (Patient taking  differently: Take 5 mg by mouth daily.) 90 tablet 3   carvedilol (COREG) 12.5 MG tablet TAKE 1 TABLET BY MOUTH 2 TIMES DAILY 180 tablet 1   clopidogrel (PLAVIX) 75 MG tablet TAKE 1 TABLET BY MOUTH EVERY DAY 90 tablet 0   nitroGLYCERIN (NITROSTAT) 0.4 MG SL tablet place 1 TABLET UNDER THE TONGUE EVERY 5 MINUTES AS NEEDED FOR CHEST PAIN 25 tablet 0   pantoprazole (PROTONIX) 40 MG tablet TAKE 1 TABLET BY MOUTH EVERY DAY 90 tablet 3   ranolazine (RANEXA) 500 MG 12 hr tablet Take 1 tablet (500 mg total) by mouth 2 (two) times daily. 180 tablet 3   rosuvastatin (CRESTOR) 40 MG tablet TAKE 1 TABLET BY MOUTH EVERY DAY 90 tablet 3   triamcinolone lotion (KENALOG) 0.1 % Apply 1 application  topically 2 (two) times daily as needed (skin irritation). Mix lotion with cream and apply twice daily to affected area     LORazepam (ATIVAN) 0.5 MG tablet TAKE 1 TABLET BY MOUTH AT BEDTIME AS NEEDED FOR ANXIETY. DO not drive FOR 8 hours AFTER taking (Patient not taking: Reported on 04/20/2023) 90 tablet 1   No current facility-administered medications for this visit.     Objective:  BP 120/70   Pulse (!) 57   Temp 98 F (36.7 C)   Ht 6\' 1"  (1.854 m)   Wt 210  lb (95.3 kg)   SpO2 98%   BMI 27.71 kg/m  Gen: NAD, resting comfortably CV: RRR no murmurs rubs or gallops Lungs: CTAB no crackles, wheeze, rhonchi Ext: no edema Skin: warm, dry      Assessment and Plan   #urology saw him for elevated PSA 12/06/22 with plan for 6 month repeat. May do biopsy if doesn't go back down  # Upcoming surgery-reverse shoulder arthroplasty planned 06/14/2023 with Dr. Judge Stall had been cleared by cardiology  #CAD/hyperlipidemia/aortic atherosclerosis -follows with [name] of [facility] cardiology S: Patient with history of stents March 27, 2018 and August 04, 2016 with stent considered in 2024 but Ranexa improved symptoms.  Compliant with Plavix 75 mg and ranexa 500 mg q12 hours., also on rosuvastatin at 40 mg with LDL goal  under 50  -no chest pain or shortness of breath  Lab Results  Component Value Date   CHOL 115 11/17/2022   HDL 41.20 11/17/2022   LDLCALC 57 11/17/2022   LDLDIRECT 69.0 02/21/2022   TRIG 87.0 11/17/2022   CHOLHDL 3 11/17/2022  A/P: coronary artery disease asymptomatic on ranexa so stable angina- continue current medications   Lipids hopeuflly improve-D Low Density Lipoprotein (LDL cholesterol) goal under 50- check LDL on higher dose of rosuvastatin  #hypertension S: compliant with carvedilol 12.5 mg twice daily, amlodipine 5 mg (down from 10 mg and less swelling) A/P: stable- continue current medicines   #Anxiety/panic attacks S: 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 -COVID-19 pandemic is also significant stressor- better after vaccine -Ativan primarily for sleep 0.5 mg nightly-has racing thoughts before bed  A/P: overall well controlled continue current medications -he is aware of increased fall and memory risks with ativan but has worked well and prefers to continue    # Diabetes new 04/19/2021 S: Medication: diet controlled Lab Results  Component Value Date   HGBA1C 6.6 (H) 11/17/2022   HGBA1C 6.3 (H) 04/20/2022   HGBA1C 6.7 (H) 02/21/2022  A/P: hopefully stable- update a1c today. Continue without meds for now   # GERD S:Medication:  pantoprazole 40 mg A/P: stable- continue current medicines    #History of DVT in 1980sx2-both were thought to be provoked . again with hip surgery in 2020s. Surgeons are aware and will be cautious  #Former smoker-quit smoking 2015 but 40 pack years.  Enrolled in lung cancer screening program- still - last done in January of this year  Recommended follow up: Return in about 7 months (around 11/18/2023) for physical or sooner if needed.Schedule b4 you leave. Future Appointments  Date Time Provider Department Center  07/09/2023  1:40 PM Marykay Lex, MD [facility] None   Lab/Order associations:   ICD-10-CM   1. Diabetes  mellitus with cardiac complication (HCC)  E11.59     2. Essential hypertension  I10     3. Dyslipidemia, goal LDL below 50  E78.5     4. Need for influenza vaccination  Z23 Flu Vaccine Trivalent High Dose (Fluad)     No orders of the defined types were placed in this encounter.  Return precautions advised.  Tana Conch, MD

## 2023-04-20 NOTE — Patient Instructions (Addendum)
Please stop by lab before you go If you have mychart- we will send your results within 3 business days of [place] receiving them.  If you do not have mychart- we will call you about results within 5 business days of [place] receiving them.  *please also note that you will see labs on mychart as soon as they post. I will later go in and write notes on them- will say "notes from Dr. Durene Cal"   Recommended follow up: Return in about 7 months (around 11/18/2023) for physical or sooner if needed.Schedule b4 you leave.

## 2023-05-14 ENCOUNTER — Other Ambulatory Visit: Payer: Self-pay | Admitting: Cardiology

## 2023-05-14 DIAGNOSIS — Z96641 Presence of right artificial hip joint: Secondary | ICD-10-CM | POA: Diagnosis not present

## 2023-05-14 DIAGNOSIS — Z96642 Presence of left artificial hip joint: Secondary | ICD-10-CM | POA: Diagnosis not present

## 2023-05-30 DIAGNOSIS — R972 Elevated prostate specific antigen [PSA]: Secondary | ICD-10-CM | POA: Diagnosis not present

## 2023-05-30 NOTE — Progress Notes (Addendum)
COVID Vaccine received:  []  No [x]  Yes Date of any COVID positive Test in last 90 days: no PCP - Tana Conch MD Cardiologist - Bryan Lemma MD  Cardiac clearance 03/22/23 Robin Searing NP  Chest x-ray - 08/13/22 Epic EKG -  12/19/22 Epic Stress Test - 08/21/22 Epic ECHO - 08/22/22 Epic Cardiac Cath - 08/30/22 Epic  Bowel Prep - [x]  No  []   Yes ______  Pacemaker / ICD device [x]  No []  Yes   Spinal Cord Stimulator:[x]  No []  Yes       History of Sleep Apnea? [x]  No []  Yes   CPAP used?- [x]  No []  Yes    Does the patient monitor blood sugar?          [x]  No []  Yes  []  N/A  Patient has: []  NO Hx DM   [x]  Pre-DM                 []  DM1  []   DM2 Does patient have a [employer] or Dexacom? []  No []  Yes   Fasting Blood Sugar Ranges-  Checks Blood Sugar ____0_ times a day  GLP1 agonist / usual dose - no GLP1 instructions:  SGLT-2 inhibitors / usual dose - no SGLT-2 instructions:   Blood Thinner / Instructions:Hold Plavix 5-7 days prior to surgery Aspirin Instructions:no  Comments:   Activity level: Patient is able to climb a flight of stairs without difficulty; [x]  No CP  [x]  No SOB, ___   Patient can  perform ADLs without assistance.   Anesthesia review: HTN, CAD, Prediabetic, Aortic atherosclerosis, COPD, DVT, Ischemic cardiomopathy  Patient denies shortness of breath, fever, cough and chest pain at PAT appointment.  Patient verbalized understanding and agreement to the Pre-Surgical Instructions that were given to them at this PAT appointment. Patient was also educated of the need to review these PAT instructions again prior to his/her surgery.I reviewed the appropriate phone numbers to call if they have any and questions or concerns.

## 2023-05-30 NOTE — Patient Instructions (Addendum)
SURGICAL WAITING ROOM VISITATION  Patients having surgery or a procedure may have no more than 2 support people in the waiting area - these visitors may rotate.    Children under the age of 62 must have an adult with them who is not the patient.  Due to an increase in RSV and influenza rates and associated hospitalizations, children ages 28 and under may not visit patients in [facility] hospitals.  If the patient needs to stay at the hospital during part of their recovery, the visitor guidelines for inpatient rooms apply. Pre-op nurse will coordinate an appropriate time for 1 support person to accompany patient in pre-op.  This support person may not rotate.    Please refer to the [facility] website for the visitor guidelines for Inpatients (after your surgery is over and you are in a regular room).       Your procedure is scheduled on: 06/14/23   Report to [facility] Main Entrance    Report to admitting at 5:15 AM   Call this number if you have problems the morning of surgery [phone]   Do not eat food :After Midnight.   After Midnight you may have the following liquids until 4:30 AM DAY OF SURGERY  Water Non-Citrus Juices (without pulp, NO RED-Apple, White grape, White cranberry) Black Coffee (NO MILK/CREAM OR CREAMERS, sugar ok)  Clear Tea (NO MILK/CREAM OR CREAMERS, sugar ok) regular and decaf                             Plain Jell-O (NO RED)                                           Fruit ices (not with fruit pulp, NO RED)                                     Popsicles (NO RED)                                                               Sports drinks like Gatorade (NO RED)                  The day of surgery:  Drink ONE (1) Pre-Surgery G2 at  4:30 AM the morning of surgery. Drink in one sitting. Do not sip.  This drink was given to you during your hospital  pre-op appointment visit. Nothing else to drink after completing the  Pre-Surgery G2.     Oral  Hygiene is also important to reduce your risk of infection.                                    Remember - BRUSH YOUR TEETH THE MORNING OF SURGERY WITH YOUR REGULAR TOOTHPASTE   Stop all vitamins and herbal supplements 7 days before surgery.   Take these medicines the morning of surgery with A SIP OF WATER: Amlodipine, Coreg(carvedilol), Pantoprazole, Ranexa(Ranolazine), Rosuvastatin  You may not have any metal on your body including hair pins, jewelry, and body piercing             Do not wear make-up, lotions, powders, perfumes/cologne, or deodorant              Men may shave face and neck.   Do not bring valuables to the hospital. [facility] IS NOT             RESPONSIBLE   FOR VALUABLES.   Contacts, glasses, dentures or bridgework may not be worn into surgery.  DO NOT BRING YOUR HOME MEDICATIONS TO THE HOSPITAL. PHARMACY WILL DISPENSE MEDICATIONS LISTED ON YOUR MEDICATION LIST TO YOU DURING YOUR ADMISSION IN THE HOSPITAL!    Patients discharged on the day of surgery will not be allowed to drive home.  Someone NEEDS to stay with you for the first 24 hours after anesthesia.   Special Instructions: Bring a copy of your healthcare power of attorney and living will documents the day of surgery if you haven't scanned them before.              Please read over the following fact sheets you were given: IF YOU HAVE QUESTIONS ABOUT YOUR PRE-OP INSTRUCTIONS PLEASE CALL [phone] [facility]   If you received a COVID test during your pre-op visit  it is requested that you wear a mask when out in public, stay away from anyone that may not be feeling well and notify your surgeon if you develop symptoms. If you test positive for Covid or have been in contact with anyone that has tested positive in the last 10 days please notify you surgeon.      Pre-operative 5 CHG Bath Instructions   You can play a key role in reducing the risk of infection after surgery. Your skin needs to be as  free of germs as possible. You can reduce the number of germs on your skin by washing with CHG (chlorhexidine gluconate) soap before surgery. CHG is an antiseptic soap that kills germs and continues to kill germs even after washing.   DO NOT use if you have an allergy to chlorhexidine/CHG or antibacterial soaps. If your skin becomes reddened or irritated, stop using the CHG and notify one of our RNs at [phone].   Please shower with the CHG soap starting 4 days before surgery using the following schedule:     Please keep in mind the following:  DO NOT shave, including legs and underarms, starting the day of your first shower.   You may shave your face at any point before/day of surgery.  Place clean sheets on your bed the day you start using CHG soap. Use a clean washcloth (not used since being washed) for each shower. DO NOT sleep with pets once you start using the CHG.   CHG Shower Instructions:  If you choose to wash your hair and private area, wash first with your normal shampoo/soap.  After you use shampoo/soap, rinse your hair and body thoroughly to remove shampoo/soap residue.  Turn the water OFF and apply about 3 tablespoons (45 ml) of CHG soap to a CLEAN washcloth.  Apply CHG soap ONLY FROM YOUR NECK DOWN TO YOUR TOES (washing for 3-5 minutes)  DO NOT use CHG soap on face, private areas, open wounds, or sores.  Pay special attention to the area where your surgery is being performed.  If you are having back surgery, having someone wash your back for you  may be helpful. Wait 2 minutes after CHG soap is applied, then you may rinse off the CHG soap.  Pat dry with a clean towel  Put on clean clothes/pajamas   If you choose to wear lotion, please use ONLY the CHG-compatible lotions on the back of this paper.     Additional instructions for the day of surgery: DO NOT APPLY any lotions, deodorants, cologne, or perfumes.   Put on clean/comfortable clothes.  Brush your teeth.  Ask  your nurse before applying any prescription medications to the skin.      CHG Compatible Lotions   Aveeno Moisturizing lotion  Cetaphil Moisturizing Cream  Cetaphil Moisturizing Lotion  Clairol Herbal Essence Moisturizing Lotion, Dry Skin  Clairol Herbal Essence Moisturizing Lotion, Extra Dry Skin  Clairol Herbal Essence Moisturizing Lotion, Normal Skin  Curel Age Defying Therapeutic Moisturizing Lotion with Alpha Hydroxy  Curel Extreme Care Body Lotion  Curel Soothing Hands Moisturizing Hand Lotion  Curel Therapeutic Moisturizing Cream, Fragrance-Free  Curel Therapeutic Moisturizing Lotion, Fragrance-Free  Curel Therapeutic Moisturizing Lotion, Original Formula  Eucerin Daily Replenishing Lotion  Eucerin Dry Skin Therapy Plus Alpha Hydroxy Crme  Eucerin Dry Skin Therapy Plus Alpha Hydroxy Lotion  Eucerin Original Crme  Eucerin Original Lotion  Eucerin Plus Crme Eucerin Plus Lotion  Eucerin TriLipid Replenishing Lotion  Keri Anti-Bacterial Hand Lotion  Keri Deep Conditioning Original Lotion Dry Skin Formula Softly Scented  Keri Deep Conditioning Original Lotion, Fragrance Free Sensitive Skin Formula  Keri Lotion Fast Absorbing Fragrance Free Sensitive Skin Formula  Keri Lotion Fast Absorbing Softly Scented Dry Skin Formula  Keri Original Lotion  Keri Skin Renewal Lotion Keri Silky Smooth Lotion  Keri Silky Smooth Sensitive Skin Lotion  Nivea Body Creamy Conditioning Oil  Nivea Body Extra Enriched Lotion  Nivea Body Original Lotion  Nivea Body Sheer Moisturizing Lotion Nivea Crme  Nivea Skin Firming Lotion  NutraDerm 30 Skin Lotion  NutraDerm Skin Lotion  NutraDerm Therapeutic Skin Cream  NutraDerm Therapeutic Skin Lotion  ProShield Protective Hand Cream   Incentive Spirometer (Watch this video at home: [email or web address])  An incentive spirometer is a tool that can help keep your lungs clear and active. This tool measures how well you are  filling your lungs with each breath. Taking long deep breaths may help reverse or decrease the chance of developing breathing (pulmonary) problems (especially infection) following: A long period of time when you are unable to move or be active. BEFORE THE PROCEDURE  If the spirometer includes an indicator to show your best effort, your nurse or respiratory therapist will set it to a desired goal. If possible, sit up straight or lean slightly forward. Try not to slouch. Hold the incentive spirometer in an upright position. INSTRUCTIONS FOR USE  Sit on the edge of your bed if possible, or sit up as far as you can in bed or on a chair. Hold the incentive spirometer in an upright position. Breathe out normally. Place the mouthpiece in your mouth and seal your lips tightly around it. Breathe in slowly and as deeply as possible, raising the piston or the ball toward the top of the column. Hold your breath for 3-5 seconds or for as long as possible. Allow the piston or ball to fall to the bottom of the column. Remove the mouthpiece from your mouth and breathe out normally. Rest for a few seconds and repeat Steps 1 through 7 at least 10 times every 1-2 hours when you are awake. Take your time and take  a few normal breaths between deep breaths. The spirometer may include an indicator to show your best effort. Use the indicator as a goal to work toward during each repetition. After each set of 10 deep breaths, practice coughing to be sure your lungs are clear. If you have an incision (the cut made at the time of surgery), support your incision when coughing by placing a pillow or rolled up towels firmly against it. Once you are able to get out of bed, walk around indoors and cough well. You may stop using the incentive spirometer when instructed by your caregiver.  RISKS AND COMPLICATIONS Take your time so you do not get dizzy or light-headed. If you are in pain, you may need to take or ask for pain  medication before doing incentive spirometry. It is harder to take a deep breath if you are having pain. AFTER USE Rest and breathe slowly and easily. It can be helpful to keep track of a log of your progress. Your caregiver can provide you with a simple table to help with this. If you are using the spirometer at home, follow these instructions: SEEK MEDICAL CARE IF:  You are having difficultly using the spirometer. You have trouble using the spirometer as often as instructed. Your pain medication is not giving enough relief while using the spirometer. You develop fever of 100.5 F (38.1 C) or higher. SEEK IMMEDIATE MEDICAL CARE IF:  You cough up bloody sputum that had not been present before. You develop fever of 102 F (38.9 C) or greater. You develop worsening pain at or near the incision site. MAKE SURE YOU:  Understand these instructions. Will watch your condition. Will get help right away if you are not doing well or get worse. Document Released: 11/27/2006 Document Revised: 10/09/2011 Document Reviewed: 01/28/2007  .[place]- Preparing for Total Shoulder Arthroplasty    Before surgery, you can play an important role. Because skin is not sterile, your skin needs to be as free of germs as possible. You can reduce the number of germs on your skin by using the following products. Benzoyl Peroxide Gel Reduces the number of germs present on the skin Applied twice a day to shoulder area starting two days before surgery    ==================================================================  Please follow these instructions carefully:  BENZOYL PEROXIDE 5% GEL  Please do not use if you have an allergy to benzoyl peroxide.   If your skin becomes reddened/irritated stop using the benzoyl peroxide.  Starting two days before surgery, apply as follows: Apply benzoyl peroxide in the morning and at night. Apply after taking a shower. If you are not taking a shower clean entire shoulder  front, back, and side along with the armpit with a clean wet washcloth.  Place a quarter-sized dollop on your shoulder and rub in thoroughly, making sure to cover the front, back, and side of your shoulder, along with the armpit.   2 days before ____ AM   ____ PM              1 day before ____ AM   ____ PM                         Do this twice a day for two days.  (Last application is the night before surgery, AFTER using the CHG soap as described below).  Do NOT apply benzoyl peroxide gel on the day of surgery.

## 2023-06-05 ENCOUNTER — Other Ambulatory Visit: Payer: Self-pay

## 2023-06-05 ENCOUNTER — Encounter (HOSPITAL_COMMUNITY)
Admission: RE | Admit: 2023-06-05 | Discharge: 2023-06-05 | Disposition: A | Discharge status: Home / Self Care | Payer: PPO | Source: Ambulatory Visit | Attending: Orthopedic Surgery | Admitting: Orthopedic Surgery

## 2023-06-05 VITALS — BP 129/74 | HR 58 | Temp 98.2°F | Resp 16 | Ht 72.0 in | Wt 206.0 lb

## 2023-06-05 DIAGNOSIS — I2510 Atherosclerotic heart disease of native coronary artery without angina pectoris: Secondary | ICD-10-CM | POA: Insufficient documentation

## 2023-06-05 DIAGNOSIS — E119 Type 2 diabetes mellitus without complications: Secondary | ICD-10-CM | POA: Diagnosis not present

## 2023-06-05 DIAGNOSIS — I252 Old myocardial infarction: Secondary | ICD-10-CM | POA: Insufficient documentation

## 2023-06-05 DIAGNOSIS — M19012 Primary osteoarthritis, left shoulder: Secondary | ICD-10-CM | POA: Insufficient documentation

## 2023-06-05 DIAGNOSIS — Z955 Presence of coronary angioplasty implant and graft: Secondary | ICD-10-CM | POA: Diagnosis not present

## 2023-06-05 DIAGNOSIS — K219 Gastro-esophageal reflux disease without esophagitis: Secondary | ICD-10-CM | POA: Diagnosis not present

## 2023-06-05 DIAGNOSIS — I255 Ischemic cardiomyopathy: Secondary | ICD-10-CM | POA: Diagnosis not present

## 2023-06-05 DIAGNOSIS — Z01812 Encounter for preprocedural laboratory examination: Secondary | ICD-10-CM | POA: Diagnosis not present

## 2023-06-05 DIAGNOSIS — Z01818 Encounter for other preprocedural examination: Secondary | ICD-10-CM

## 2023-06-05 DIAGNOSIS — I10 Essential (primary) hypertension: Secondary | ICD-10-CM | POA: Insufficient documentation

## 2023-06-05 DIAGNOSIS — Z87891 Personal history of nicotine dependence: Secondary | ICD-10-CM | POA: Insufficient documentation

## 2023-06-05 DIAGNOSIS — J449 Chronic obstructive pulmonary disease, unspecified: Secondary | ICD-10-CM | POA: Insufficient documentation

## 2023-06-05 LAB — BASIC METABOLIC PANEL
Anion gap: 8 (ref 5–15)
BUN: 18 mg/dL (ref 8–23)
CO2: 24 mmol/L (ref 22–32)
Calcium: 9.1 mg/dL (ref 8.9–10.3)
Chloride: 106 mmol/L (ref 98–111)
Creatinine, Ser: 0.96 mg/dL (ref 0.61–1.24)
GFR, Estimated: >60 mL/min (ref >60)
Glucose, Bld: 127 mg/dL — ABNORMAL HIGH (ref 70–99)
Potassium: 4.1 mmol/L (ref 3.5–5.1)
Sodium: 138 mmol/L (ref 135–145)

## 2023-06-05 LAB — CBC
HCT: 40.4 % (ref 39.0–52.0)
Hemoglobin: 13.1 g/dL (ref 13.0–17.0)
MCH: 28.4 pg (ref 26.0–34.0)
MCHC: 32.4 g/dL (ref 30.0–36.0)
MCV: 87.4 fL (ref 80.0–100.0)
Platelets: 212 10*3/uL (ref 150–400)
RBC: 4.62 MIL/uL (ref 4.22–5.81)
RDW: 14.6 % (ref 11.5–15.5)
WBC: 6.4 10*3/uL (ref 4.0–10.5)
nRBC: 0 % (ref 0.0–0.2)

## 2023-06-06 DIAGNOSIS — R3912 Poor urinary stream: Secondary | ICD-10-CM | POA: Diagnosis not present

## 2023-06-06 DIAGNOSIS — N401 Enlarged prostate with lower urinary tract symptoms: Secondary | ICD-10-CM | POA: Diagnosis not present

## 2023-06-06 DIAGNOSIS — R972 Elevated prostate specific antigen [PSA]: Secondary | ICD-10-CM | POA: Diagnosis not present

## 2023-06-06 LAB — SURGICAL PCR SCREEN
MRSA, PCR: NEGATIVE
Staphylococcus aureus: POSITIVE — AB

## 2023-06-07 ENCOUNTER — Encounter (HOSPITAL_COMMUNITY): Payer: Self-pay

## 2023-06-07 NOTE — Progress Notes (Signed)
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 PMH of former smoking (quit 2015), HTN, hx of STEMI, CAD s/p PCI (07/2016), ICM, PVCs, MR, COPD, GERD, pre-diabetes, anxiety, arthritis.  Patient follows with Cardiology for hx of CAD s/p PCI and STEMI in 2018, ischemic CM, ventricular bigeminy. Last seen in clinic by [name] on 12/19/22. Was having some chest pain and palpitation but noted to be symptomatically improved. Had telephone visit for clearance on 03/22/23 and cleared for upcoming surgery:   Preoperative Cardiovascular Risk Assessment: -Patient's RCRI score is 6.6%   The patient affirms he has been doing well without any new cardiac symptoms. They are able to achieve 7 METS without cardiac limitations. Therefore, based on ACC/AHA guidelines, the patient would be at acceptable risk for the planned procedure without further cardiovascular testing. The patient was advised that if he develops new symptoms prior to surgery to contact our office to arrange for a follow-up visit, and he verbalized understanding.    The patient was advised that if he develops new symptoms prior to surgery to contact our office to arrange for a follow-up visit, and he verbalized understanding.   -Patient can hold Plavix 5 to 7 days prior to procedure and should restart postprocedure when surgically safe and hemostasis is achieved.  VS: BP 129/74   Pulse (!) 58   Temp 36.8 C (Oral)   Resp 16   Ht 6' (1.829 m)   Wt 93.4 kg   SpO2 96%   BMI 27.94 kg/m   PROVIDERS: Shelva Majestic, MD   LABS: Labs reviewed: Acceptable for surgery. (all labs ordered are listed, but only  abnormal results are displayed)  Labs Reviewed  SURGICAL PCR SCREEN - Abnormal; Notable for the following components:      Result Value   Staphylococcus aureus POSITIVE (*)    All other components within normal limits  BASIC METABOLIC PANEL - Abnormal; Notable for the following components:   Glucose, Bld 127 (*)    All other components within normal limits  CBC     IMAGES:  CT Chest 08/02/2022:  IMPRESSION: 1. Lung-RADS 2, benign appearance or behavior. Continue annual screening with low-dose chest CT without contrast in 12 months. 2. Diffuse bronchial wall thickening with emphysema, as above; imaging findings suggestive of underlying COPD. 3. Coronary artery calcifications. 4. Aortic Atherosclerosis (ICD10-I70.0) and Emphysema (ICD10-J43.9).   EKG:   CV:  7-day Zio patch February 2024:   Predominant rhythm sinus rhythm with 1  AVB.  Heart rate 57 to 117 bpm with an average of 73 bpm.   Rare PACs; PVCs (11.3%).PVCs couplets and triplets as well as bigeminy noted.   5 short bursts of 4 beat PVC runs (technically considered ventricular tachycardia) heart rate ranged from 114 with 150 bpm.   2 atrial runs-558 with heart rate range of 121 and 98 bpm.   Symptoms are noted with PVCs admits quite a bit of them.  This is definitely something that we want to look into potentially controlling PVCs => we talked about potentially switching from carvedilol to Toprol which we can discuss at follow-up visit -> looks like he is  scheduled to come back in July.  Would like to see if we can probably have him come back maybe late May or early Jun:  LHC 08/30/2022:    Prox LAD lesion is 55% stenosed.  Mid LAD to Dist LAD lesion is 45% stenosed.   Prox RCA to Dist RCA lesion is 95% stenosed. Dist RCA-1 lesion is 100% stenosed.   Dist RCA-2 lesion is 90% stenosed with 95% stenosed side branch in RPAV. ->  Both branches fill via left-to-right collaterals with faint filling.   Previously placed  Prox Cx to Dist Cx DES stent is  widely patent.   Previously placed LPAV DES stent is  widely patent.   LV end diastolic pressure is mildly elevated.   There is no aortic valve stenosis.   Multivessel disease:. Culprit lesion is mid RCA severe 95% stenosis tapering to 100% CTO just possible to the bifurcation into PDA PL (left right collaterals noted) Mild progression of proximal LAD stenosis 55% with mid LAD percent Widely patent proximal Cx stent as well as AV groove Cx stent   LVEDP 16 mmHg  Frequent PVCs with bigeminy     RECOMMENDATIONS Plan for now will be to titrate medical management, and reassess symptoms after 3 to 4 weeks.  If still symptomatic, would then consider referral for CTO PCI of RCA. Increase amlodipine to 10 mg daily Add Ranexa 500 mg twice daily Consider converting from carvedilol to Toprol to allow BP room and increase beta-blockade due to significant PVCs  Echo 08/22/2022:  IMPRESSIONS     1. Left ventricular ejection fraction, by estimation, is 55 to 60%. Left  ventricular ejection fraction by 3D volume is 66 %. The left ventricle has  normal function. The left ventricle has no regional wall motion  abnormalities. There is mild concentric  left ventricular hypertrophy. Left ventricular diastolic parameters were  normal.   2. Right ventricular systolic function is normal. The right ventricular  size is normal.   3. Left atrial size was severely dilated.   4. The mitral valve is normal in structure. Trivial mitral valve  regurgitation. No evidence of mitral stenosis.   5. The aortic valve is tricuspid. Aortic valve regurgitation is not  visualized. No aortic stenosis is present.   6. The inferior vena cava is normal in size with greater than 50%  respiratory variability, suggesting right atrial pressure of 3 mmHg.   Comparison(s): No significant change from prior study. Prior images  reviewed side by side. Very frequent PVCs are recorded during the  study.  This may explain poor gating during nuclear scintigraphy.  Past Medical History:  Diagnosis Date   Anginal pain (HCC) 2018   Anxiety    Arthritis    shoulders hips,    CAD S/P percutaneous coronary angioplasty 07/2016   a) 07/2016: 100% pCx -- DES PCI with 4.0 x 16 mm Synergy DES. 65% ostial OM 3 (relatively small caliber - Med Rx);; b) 03/27/18 PCI/DES to dLCX/OM3 x1. Patent pCX DES. 70% dRCA trifurcation lesion (Med Rx). Normal EF;; 07/2022: Patent Cx/LPAV Stents patent. ~mRCA 95%--> dRCA 100% (prior 90% bifurcation lesion). pLAD 55%, m-dLAD 45%. L-R Collaterals fill PAVPL & PDA.   COPD (chronic obstructive pulmonary disease) (HCC) 2018   mild   DVT (deep venous thrombosis) (HCC)    Right upper arm DVT on 2 occasions in the remote past   Dysrhythmia    V tac   Ejection fraction    LV function normal, echo, February, 2010  GERD (gastroesophageal reflux disease)    History of tobacco abuse 2015   43-pack-year history   Hyperlipidemia    Hypertension    Mitral regurgitation    Mild, echo, February, 2010   Palpitations    Mild in the past   Peripheral vascular disease (HCC) 1980s   DVT   Pre-diabetes    Statin intolerance    Felt poorly after Lipitor, Crestor, TriCor   STEMI involving left circumflex coronary artery (HCC) 08/04/2016   Occluded very large caliber codominant circumflex - PCI with single Synergy DES    Past Surgical History:  Procedure Laterality Date   CARDIAC CATHETERIZATION N/A 08/04/2016   Procedure: Left Heart Cath and Coronary Angiography;  Surgeon: Marykay Lex, MD;  Location: [facility] INVASIVE CV LAB;  Service: Cardiovascular;  Laterality: N/A: 100% pCx --> PCI. residual 65% pOM3 (small).  ~EF 35-45%  with lateral HK.   CARDIAC CATHETERIZATION N/A 08/04/2016   Procedure: CORONARY STENT INTERVENTION;  Surgeon: Marykay Lex, MD;  Location: [facility] INVASIVE CV LAB;  Service: Cardiovascular;  Laterality: N/A: pCx 100%-0%: Synergy DES 4.0 x 16 (4.6 mm)    CHONDROPLASTY Right 04/04/2017   Procedure: CHONDROPLASTY;  Surgeon: Jodi Geralds, MD;  Location[facility];  Service: Orthopedics;  Laterality: Right;   CORONARY STENT INTERVENTION N/A 03/27/2018   Procedure: CORONARY STENT INTERVENTION;  Surgeon: Swazil[place], Peter M, MD;  Location: [facility] INVASIVE CV LAB;  Service: Cardiovascular:: dCx-OM3 95%-0%: DES PCI - Synergy 2.25 x 16 mm.   GANGLION CYST EXCISION     oct 2017   HERNIA REPAIR     umbilical hernia 2017 oct   KNEE ARTHROSCOPY Right 04/04/2017   Procedure: ARTHROSCOPY RIGHT KNEE, WITH MEIDAL FEMORAL CONDYLE PATELLAR MEDIAL FEMORAL JOINT PLICA EXCISION ;  Surgeon: Jodi Geralds, MD;  Location[facility];  Service: Orthopedics;  Laterality: Right;   LEFT HEART CATH AND CORONARY ANGIOGRAPHY N/A 03/27/2018   Procedure: LEFT HEART CATH AND CORONARY ANGIOGRAPHY;  Surgeon: Swazil[place], Peter M, MD;  Location: [facility] INVASIVE CV LAB;  Service: Cardiovascular:   2 vessel obstructive CAD: - 95% dLCx/OM3 --Successful PCI of the dLCx/OM3 with SYNERGY DES 2.25X16, -70% distal RCA at trifurcation (Med Management).  Widely patent pLCx DES. Low normal LV function (50-55%), Normal LVEDP    LEFT HEART CATH AND CORONARY ANGIOGRAPHY N/A 08/30/2022   Procedure: LEFT HEART CATH AND CORONARY ANGIOGRAPHY;  Surgeon: Marykay Lex, MD;  Location: [facility] INVASIVE CV LAB;  Service: CV. ::pLAD 55%, m-d LAD 45%. pRCA-dRCA 95% (SubTO) -> dRCA 100% CTO prior to Bifurcation w/ 90-95% into PDA & PAV - both fill via L-R collaterals. pCx DES & LPAV stent patent.  No AS. LVEDP 16 mmHg.   NM MYOVIEW LTD  08/23/2022   INTERMEDIATE RISK.  EF~39%.  Infero-septal wall hypokinesis.  No ST segment changes.  Partially veritable medium sized, moderate severity defect in the mid to basal inferoseptal wall.; Considered Infarct w/ Peri-Infarct Ischemia. => Referred for Cath.   TOTAL HIP ARTHROPLASTY Left 04/08/2020   Procedure: TOTAL HIP ARTHROPLASTY ANTERIOR APPROACH;  Surgeon: Durene Romans, MD;  Location[facility];   Service: Orthopedics;  Laterality: Left;  70 mins   TOTAL HIP ARTHROPLASTY Right 05/02/2022   Procedure: TOTAL HIP ARTHROPLASTY ANTERIOR APPROACH;  Surgeon: Durene Romans, MD;  Location[facility];  Service: Orthopedics;  Laterality: Right;  90   TRANSTHORACIC ECHOCARDIOGRAM  08/22/2022   Normal LV Size &Fxn - EF ~ 55-65%. No RWMA. Mild Conc LVH with Severe LA dilation (but ?  Normal Diastolic Fxn). Normal RV size & Fxn with normal RVP & RAP. Normal MV & AoV.   TRANSTHORACIC ECHOCARDIOGRAM  01/11/2017   a) a) 07/2016: post STEMI: EF 40-45%. Mild concentric LVH. Severe HK of basal-mid inferolateral wall consistent with infarct in this distribution. GR 1 DD.;; b) 12/2016: EF 50-55%. GR 1 DD. Mild to moderate LA dilation.    MEDICATIONS:  acetaminophen (TYLENOL) 650 MG CR tablet   amLODipine (NORVASC) 5 MG tablet   carvedilol (COREG) 12.5 MG tablet   clopidogrel (PLAVIX) 75 MG tablet   LORazepam (ATIVAN) 0.5 MG tablet   nitroGLYCERIN (NITROSTAT) 0.4 MG SL tablet   pantoprazole (PROTONIX) 40 MG tablet   ranolazine (RANEXA) 500 MG 12 hr tablet   rosuvastatin (CRESTOR) 40 MG tablet   triamcinolone lotion (KENALOG) 0.1 %   No current facility-administered medications for this encounter.   Marcille Blanco [facility] Short Stay/Anesthesiology [facility] Phone [phone] 06/07/2023 2:15 PM

## 2023-06-07 NOTE — Anesthesia Preprocedure Evaluation (Addendum)
Anesthesia Evaluation  Patient identified by MRN, date of birth, ID band Patient awake    Reviewed: Allergy & Precautions, H&P , NPO status , Patient's Chart, lab work & pertinent test results  Airway Mallampati: II  TM Distance: >3 FB Neck ROM: Full    Dental no notable dental hx.    Pulmonary COPD, former smoker   Pulmonary exam normal breath sounds clear to auscultation       Cardiovascular hypertension, + CAD, + Past MI, + Cardiac Stents and + Peripheral Vascular Disease  Normal cardiovascular exam Rhythm:Regular Rate:Normal  1. Left ventricular ejection fraction, by estimation, is 55 to 60%. Left  ventricular ejection fraction by 3D volume is 66 %. The left ventricle has  normal function. The left ventricle has no regional wall motion  abnormalities. There is mild concentric  left ventricular hypertrophy. Left ventricular diastolic parameters were  normal.   2. Right ventricular systolic function is normal. The right ventricular  size is normal.   3. Left atrial size was severely dilated.   4. The mitral valve is normal in structure. Trivial mitral valve  regurgitation. No evidence of mitral stenosis.   5. The aortic valve is tricuspid. Aortic valve regurgitation is not  visualized. No aortic stenosis is present.   6. The inferior vena cava is normal in size with greater than 50%  respiratory variability, suggesting right atrial pressure of 3 mmHg.     Neuro/Psych   Anxiety     negative neurological ROS     GI/Hepatic Neg liver ROS,GERD  ,,  Endo/Other  diabetes    Renal/GU negative Renal ROS  negative genitourinary   Musculoskeletal  (+) Arthritis ,    Abdominal   Peds negative pediatric ROS (+)  Hematology negative hematology ROS (+)   Anesthesia Other Findings   Reproductive/Obstetrics negative OB ROS                              Anesthesia Physical Anesthesia Plan  ASA:  4  Anesthesia Plan: General and Regional   Post-op Pain Management: Regional block*   Induction: Intravenous  PONV Risk Score and Plan: 2 and Ondansetron and Dexamethasone  Airway Management Planned: Oral ETT  Additional Equipment:   Intra-op Plan:   Post-operative Plan:   Informed Consent:   Plan Discussed with:   Anesthesia Plan Comments: (See PAT note from 11/5 by Sherlie Ban PA-C   Terry Wright is a 67 yo male who presents to PAT prior to surgery above. PMH of former smoking (quit 2015), HTN, hx of STEMI, CAD s/p PCI (07/2016), ICM, PVCs, MR, COPD, GERD, pre-diabetes, anxiety, arthritis.   Patient follows with Cardiology for hx of CAD s/p PCI and STEMI in 2018, ischemic CM, ventricular bigeminy. Last seen in clinic by [name] on 12/19/22. Was having some chest pain and palpitation but noted to be symptomatically improved. Had telephone visit for clearance on 03/22/23 and cleared for upcoming surgery: )         Anesthesia Quick Evaluation

## 2023-06-07 NOTE — Progress Notes (Signed)
Please review PCR results from 06/05/23.

## 2023-06-14 ENCOUNTER — Encounter (HOSPITAL_COMMUNITY)
Admission: RE | Disposition: A | Discharge status: Home / Self Care | Payer: Self-pay | Source: Ambulatory Visit | Attending: Orthopedic Surgery

## 2023-06-14 ENCOUNTER — Other Ambulatory Visit: Payer: Self-pay

## 2023-06-14 ENCOUNTER — Ambulatory Visit (HOSPITAL_COMMUNITY)
Admission: RE | Admit: 2023-06-14 | Discharge: 2023-06-14 | Disposition: A | Discharge status: Home / Self Care | Payer: PPO | Source: Ambulatory Visit | Attending: Orthopedic Surgery | Admitting: Orthopedic Surgery

## 2023-06-14 ENCOUNTER — Ambulatory Visit (HOSPITAL_COMMUNITY): Payer: Self-pay

## 2023-06-14 ENCOUNTER — Ambulatory Visit (HOSPITAL_COMMUNITY): Payer: PPO | Admitting: Medical

## 2023-06-14 ENCOUNTER — Encounter (HOSPITAL_COMMUNITY): Payer: Self-pay | Admitting: Orthopedic Surgery

## 2023-06-14 DIAGNOSIS — Z955 Presence of coronary angioplasty implant and graft: Secondary | ICD-10-CM | POA: Insufficient documentation

## 2023-06-14 DIAGNOSIS — K219 Gastro-esophageal reflux disease without esophagitis: Secondary | ICD-10-CM | POA: Diagnosis not present

## 2023-06-14 DIAGNOSIS — I110 Hypertensive heart disease with heart failure: Secondary | ICD-10-CM | POA: Diagnosis not present

## 2023-06-14 DIAGNOSIS — M19012 Primary osteoarthritis, left shoulder: Secondary | ICD-10-CM

## 2023-06-14 DIAGNOSIS — M24612 Ankylosis, left shoulder: Secondary | ICD-10-CM | POA: Diagnosis not present

## 2023-06-14 DIAGNOSIS — J449 Chronic obstructive pulmonary disease, unspecified: Secondary | ICD-10-CM | POA: Insufficient documentation

## 2023-06-14 DIAGNOSIS — G8918 Other acute postprocedural pain: Secondary | ICD-10-CM | POA: Diagnosis not present

## 2023-06-14 DIAGNOSIS — I252 Old myocardial infarction: Secondary | ICD-10-CM | POA: Insufficient documentation

## 2023-06-14 DIAGNOSIS — Z87891 Personal history of nicotine dependence: Secondary | ICD-10-CM | POA: Diagnosis not present

## 2023-06-14 DIAGNOSIS — E1151 Type 2 diabetes mellitus with diabetic peripheral angiopathy without gangrene: Secondary | ICD-10-CM | POA: Diagnosis not present

## 2023-06-14 DIAGNOSIS — E119 Type 2 diabetes mellitus without complications: Secondary | ICD-10-CM

## 2023-06-14 DIAGNOSIS — I2510 Atherosclerotic heart disease of native coronary artery without angina pectoris: Secondary | ICD-10-CM | POA: Diagnosis not present

## 2023-06-14 DIAGNOSIS — I739 Peripheral vascular disease, unspecified: Secondary | ICD-10-CM | POA: Diagnosis not present

## 2023-06-14 HISTORY — PX: REVERSE SHOULDER ARTHROPLASTY: SHX5054

## 2023-06-14 LAB — GLUCOSE, CAPILLARY
Glucose-Capillary: 113 mg/dL — ABNORMAL HIGH (ref 70–99)
Glucose-Capillary: 120 mg/dL — ABNORMAL HIGH (ref 70–99)

## 2023-06-14 SURGERY — ARTHROPLASTY, SHOULDER, TOTAL, REVERSE
Anesthesia: Regional | Site: Shoulder | Laterality: Left

## 2023-06-14 MED ORDER — FENTANYL CITRATE (PF) 100 MCG/2ML IJ SOLN
INTRAMUSCULAR | Status: DC | PRN
  Administered 2023-06-14 (×2): 50 ug via INTRAVENOUS

## 2023-06-14 MED ORDER — DEXAMETHASONE SODIUM PHOSPHATE 10 MG/ML IJ SOLN
INTRAMUSCULAR | Status: AC
  Filled 2023-06-14: qty 1

## 2023-06-14 MED ORDER — BUPIVACAINE LIPOSOME 1.3 % IJ SUSP
INTRAMUSCULAR | Status: DC | PRN
  Administered 2023-06-14: 10 mL

## 2023-06-14 MED ORDER — CEFAZOLIN SODIUM-DEXTROSE 2-4 GM/100ML-% IV SOLN
INTRAVENOUS | Status: AC
  Filled 2023-06-14: qty 100

## 2023-06-14 MED ORDER — CHLORHEXIDINE GLUCONATE 0.12 % MT SOLN
15.0000 mL | Freq: Once | OROMUCOSAL | Status: AC
  Administered 2023-06-14: 15 mL via OROMUCOSAL

## 2023-06-14 MED ORDER — EPHEDRINE 5 MG/ML INJ
INTRAVENOUS | Status: AC
  Filled 2023-06-14: qty 5

## 2023-06-14 MED ORDER — PHENYLEPHRINE 80 MCG/ML (10ML) SYRINGE FOR IV PUSH (FOR BLOOD PRESSURE SUPPORT)
PREFILLED_SYRINGE | INTRAVENOUS | Status: DC | PRN
  Administered 2023-06-14: 80 ug via INTRAVENOUS
  Administered 2023-06-14: 160 ug via INTRAVENOUS
  Administered 2023-06-14: 40 ug via INTRAVENOUS
  Administered 2023-06-14: 160 ug via INTRAVENOUS
  Administered 2023-06-14: 40 ug via INTRAVENOUS

## 2023-06-14 MED ORDER — TRANEXAMIC ACID 1000 MG/10ML IV SOLN: 1000.0000 mg | INTRAVENOUS | Status: DC

## 2023-06-14 MED ORDER — FENTANYL CITRATE (PF) 100 MCG/2ML IJ SOLN
INTRAMUSCULAR | Status: AC
  Filled 2023-06-14: qty 2

## 2023-06-14 MED ORDER — OXYCODONE-ACETAMINOPHEN 5-325 MG PO TABS: 1.0000 | ORAL_TABLET | ORAL | 0 refills | Status: DC | PRN

## 2023-06-14 MED ORDER — SUGAMMADEX SODIUM 200 MG/2ML IV SOLN
INTRAVENOUS | Status: DC | PRN
  Administered 2023-06-14: 200 mg via INTRAVENOUS

## 2023-06-14 MED ORDER — EPHEDRINE SULFATE (PRESSORS) 50 MG/ML IJ SOLN
INTRAMUSCULAR | Status: DC | PRN
  Administered 2023-06-14 (×2): 5 mg via INTRAVENOUS
  Administered 2023-06-14 (×4): 10 mg via INTRAVENOUS

## 2023-06-14 MED ORDER — CYCLOBENZAPRINE HCL 10 MG PO TABS: 10.0000 mg | ORAL_TABLET | Freq: Three times a day (TID) | ORAL | 1 refills | Status: DC | PRN

## 2023-06-14 MED ORDER — LIDOCAINE HCL (PF) 2 % IJ SOLN
INTRAMUSCULAR | Status: AC
  Filled 2023-06-14: qty 5

## 2023-06-14 MED ORDER — ROCURONIUM BROMIDE 10 MG/ML (PF) SYRINGE
PREFILLED_SYRINGE | INTRAVENOUS | Status: AC
  Filled 2023-06-14: qty 10

## 2023-06-14 MED ORDER — BUPIVACAINE HCL (PF) 0.5 % IJ SOLN
INTRAMUSCULAR | Status: DC | PRN
  Administered 2023-06-14: 10 mL

## 2023-06-14 MED ORDER — ROCURONIUM BROMIDE 100 MG/10ML IV SOLN
INTRAVENOUS | Status: DC | PRN
  Administered 2023-06-14: 70 mg via INTRAVENOUS

## 2023-06-14 MED ORDER — LACTATED RINGERS IV SOLN: INTRAVENOUS | Status: DC

## 2023-06-14 MED ORDER — ORAL CARE MOUTH RINSE: 15.0000 mL | Freq: Once | OROMUCOSAL | Status: AC

## 2023-06-14 MED ORDER — ONDANSETRON HCL 4 MG/2ML IJ SOLN
INTRAMUSCULAR | Status: DC | PRN
  Administered 2023-06-14: 4 mg via INTRAVENOUS

## 2023-06-14 MED ORDER — LIDOCAINE HCL (CARDIAC) PF 100 MG/5ML IV SOSY
PREFILLED_SYRINGE | INTRAVENOUS | Status: DC | PRN
  Administered 2023-06-14: 80 mg via INTRATRACHEAL

## 2023-06-14 MED ORDER — DEXAMETHASONE SODIUM PHOSPHATE 10 MG/ML IJ SOLN
INTRAMUSCULAR | Status: DC | PRN
  Administered 2023-06-14: 10 mg via INTRAVENOUS

## 2023-06-14 MED ORDER — ONDANSETRON HCL 4 MG PO TABS: 4.0000 mg | ORAL_TABLET | Freq: Three times a day (TID) | ORAL | 0 refills | Status: DC | PRN

## 2023-06-14 MED ORDER — STERILE WATER FOR IRRIGATION IR SOLN
Status: DC | PRN
  Administered 2023-06-14: 1000 mL

## 2023-06-14 MED ORDER — TRANEXAMIC ACID-NACL 1000-0.7 MG/100ML-% IV SOLN
1000.0000 mg | INTRAVENOUS | Status: AC
  Administered 2023-06-14: 1000 mg via INTRAVENOUS

## 2023-06-14 MED ORDER — ONDANSETRON HCL 4 MG/2ML IJ SOLN
INTRAMUSCULAR | Status: AC
  Filled 2023-06-14: qty 2

## 2023-06-14 MED ORDER — VANCOMYCIN HCL 1000 MG IV SOLR
INTRAVENOUS | Status: AC
  Filled 2023-06-14: qty 20

## 2023-06-14 MED ORDER — PROPOFOL 10 MG/ML IV BOLUS
INTRAVENOUS | Status: DC | PRN
  Administered 2023-06-14: 100 mg via INTRAVENOUS

## 2023-06-14 MED ORDER — 0.9 % SODIUM CHLORIDE (POUR BTL) OPTIME
TOPICAL | Status: DC | PRN
  Administered 2023-06-14: 1000 mL

## 2023-06-14 MED ORDER — CEFAZOLIN SODIUM-DEXTROSE 2-4 GM/100ML-% IV SOLN
2.0000 g | INTRAVENOUS | Status: AC
  Administered 2023-06-14: 2 g via INTRAVENOUS

## 2023-06-14 MED ORDER — TRANEXAMIC ACID-NACL 1000-0.7 MG/100ML-% IV SOLN
INTRAVENOUS | Status: AC
  Filled 2023-06-14: qty 100

## 2023-06-14 MED ORDER — MIDAZOLAM HCL 2 MG/2ML IJ SOLN
INTRAMUSCULAR | Status: AC
  Filled 2023-06-14: qty 2

## 2023-06-14 MED ORDER — INSULIN ASPART 100 UNIT/ML IJ SOLN: 0.0000 [IU] | INTRAMUSCULAR | Status: DC | PRN

## 2023-06-14 MED ORDER — AMISULPRIDE (ANTIEMETIC) 5 MG/2ML IV SOLN
10.0000 mg | Freq: Once | INTRAVENOUS | Status: AC
  Administered 2023-06-14: 10 mg via INTRAVENOUS

## 2023-06-14 MED ORDER — MIDAZOLAM HCL 5 MG/5ML IJ SOLN
INTRAMUSCULAR | Status: DC | PRN
  Administered 2023-06-14: 2 mg via INTRAVENOUS

## 2023-06-14 MED ORDER — AMISULPRIDE (ANTIEMETIC) 5 MG/2ML IV SOLN
INTRAVENOUS | Status: AC
  Filled 2023-06-14: qty 4

## 2023-06-14 MED ORDER — VANCOMYCIN HCL 1000 MG IV SOLR
INTRAVENOUS | Status: DC | PRN
  Administered 2023-06-14: 1000 mg

## 2023-06-14 SURGICAL SUPPLY — 64 items
BAG COUNTER SPONGE SURGICOUNT (BAG) IMPLANT
BAG ZIPLOCK 12X15 (MISCELLANEOUS) ×2 IMPLANT
BASEPLATE MOD 24+4 LAT (Plate) IMPLANT
BIT DRILL AR 3 NS (BIT) IMPLANT
BLADE SAW SGTL 83.5X18.5 (BLADE) ×2 IMPLANT
BNDG COHESIVE 4X5 TAN STRL LF (GAUZE/BANDAGES/DRESSINGS) ×2 IMPLANT
COOLER ICEMAN CLASSIC (MISCELLANEOUS) ×2 IMPLANT
COVER BACK TABLE 60X90IN (DRAPES) ×2 IMPLANT
COVER SURGICAL LIGHT HANDLE (MISCELLANEOUS) ×2 IMPLANT
CUP SUT UNIV REVERS 39 NEU (Shoulder) IMPLANT
DERMABOND ADVANCED .7 DNX12 (GAUZE/BANDAGES/DRESSINGS) ×2 IMPLANT
DERMABOND ADVANCED .7 DNX6 (GAUZE/BANDAGES/DRESSINGS) IMPLANT
DRAPE SHEET LG 3/4 BI-LAMINATE (DRAPES) ×2 IMPLANT
DRAPE SURG 17X11 SM STRL (DRAPES) ×2 IMPLANT
DRAPE SURG ORHT 6 SPLT 77X108 (DRAPES) ×4 IMPLANT
DRAPE TOP 10253 STERILE (DRAPES) ×2 IMPLANT
DRAPE U-SHAPE 47X51 STRL (DRAPES) ×2 IMPLANT
DRESSING AQUACEL AG SP 3.5X6 (GAUZE/BANDAGES/DRESSINGS) ×2 IMPLANT
DRSG AQUACEL AG ADV 3.5X 6 (GAUZE/BANDAGES/DRESSINGS) IMPLANT
DRSG AQUACEL AG ADV 3.5X10 (GAUZE/BANDAGES/DRESSINGS) IMPLANT
DRSG AQUACEL AG SP 3.5X6 (GAUZE/BANDAGES/DRESSINGS) ×1
DURAPREP 26ML APPLICATOR (WOUND CARE) ×2 IMPLANT
ELECT BLADE TIP CTD 4 INCH (ELECTRODE) ×2 IMPLANT
ELECT PENCIL ROCKER SW 15FT (MISCELLANEOUS) ×2 IMPLANT
ELECT REM PT RETURN 15FT ADLT (MISCELLANEOUS) ×2 IMPLANT
FACESHIELD WRAPAROUND (MASK) ×5 IMPLANT
FACESHIELD WRAPAROUND OR TEAM (MASK) ×10 IMPLANT
FIBERTAPE CERCLAGE TLINK SUT (SUTURE) IMPLANT
GLENOSPHERE 39+4 LAT/24 UNI RV (Joint) IMPLANT
GLOVE BIO SURGEON STRL SZ7.5 (GLOVE) ×2 IMPLANT
GLOVE BIO SURGEON STRL SZ8 (GLOVE) ×2 IMPLANT
GLOVE SS BIOGEL STRL SZ 7 (GLOVE) ×2 IMPLANT
GLOVE SS BIOGEL STRL SZ 7.5 (GLOVE) ×2 IMPLANT
GOWN STRL SURGICAL XL XLNG (GOWN DISPOSABLE) ×4 IMPLANT
INSERT HUMERAL M/39 +3/CNSTRND (Miscellaneous) IMPLANT
KIT BASIN OR (CUSTOM PROCEDURE TRAY) ×2 IMPLANT
KIT TURNOVER KIT A (KITS) IMPLANT
MANIFOLD NEPTUNE II (INSTRUMENTS) ×2 IMPLANT
NDL TAPERED W/ NITINOL LOOP (MISCELLANEOUS) ×2 IMPLANT
NEEDLE TAPERED W/ NITINOL LOOP (MISCELLANEOUS) ×1 IMPLANT
NS IRRIG 1000ML POUR BTL (IV SOLUTION) ×2 IMPLANT
PACK SHOULDER (CUSTOM PROCEDURE TRAY) ×2 IMPLANT
PAD ARMBOARD 7.5X6 YLW CONV (MISCELLANEOUS) ×2 IMPLANT
PAD COLD SHLDR WRAP-ON (PAD) ×2 IMPLANT
PIN NITINOL TARGETER 2.8 (PIN) IMPLANT
PIN SET MODULAR GLENOID SYSTEM (PIN) IMPLANT
RESTRAINT HEAD UNIVERSAL NS (MISCELLANEOUS) ×2 IMPLANT
SCREW CENTRAL MOD 30MM (Screw) IMPLANT
SCREW PERIPHERAL 5.5X20 LOCK (Screw) IMPLANT
SCREW PERIPHERAL 5.5X28 LOCK (Screw) IMPLANT
SLING ARM FOAM STRAP LRG (SOFTGOODS) IMPLANT
SLING ARM FOAM STRAP MED (SOFTGOODS) IMPLANT
SLING ARM FOAM STRAP XLG (SOFTGOODS) IMPLANT
STEM HUM UNIV REV SZ13 (Stem) IMPLANT
SUT MNCRL AB 3-0 PS2 18 (SUTURE) ×2 IMPLANT
SUT MON AB 2-0 CT1 36 (SUTURE) ×2 IMPLANT
SUT VIC AB 1 CT1 36 (SUTURE) ×2 IMPLANT
SUTURE TAPE 1.3 40 TPR END (SUTURE) ×4 IMPLANT
SUTURETAPE 1.3 40 TPR END (SUTURE) ×2
TOWEL OR 17X26 10 PK STRL BLUE (TOWEL DISPOSABLE) ×2 IMPLANT
TOWEL OR NON WOVEN STRL DISP B (DISPOSABLE) ×2 IMPLANT
TUBE SUCTION HIGH CAP CLEAR NV (SUCTIONS) ×2 IMPLANT
TUBING CONNECTING 10 (TUBING) ×2 IMPLANT
WATER STERILE IRR 1000ML POUR (IV SOLUTION) ×4 IMPLANT

## 2023-06-14 NOTE — Anesthesia Procedure Notes (Signed)
Procedure Name: Intubation Date/Time: 06/14/2023 7:42 AM  Performed by: Donna Bernard, CRNAPre-anesthesia Checklist: Patient identified, Emergency Drugs available, Suction available, Patient being monitored and Timeout performed Patient Re-evaluated:Patient Re-evaluated prior to induction Oxygen Delivery Method: Circle system utilized Preoxygenation: Pre-oxygenation with 100% oxygen Induction Type: IV induction Ventilation: Mask ventilation without difficulty Laryngoscope Size: Miller and 3 Tube type: Oral Tube size: 7.5 mm Number of attempts: 1 Airway Equipment and Method: Stylet Placement Confirmation: positive ETCO2, ETT inserted through vocal cords under direct vision, CO2 detector and breath sounds checked- equal and bilateral Secured at: 23 cm Tube secured with: Tape Dental Injury: Teeth and Oropharynx as per pre-operative assessment

## 2023-06-14 NOTE — Transfer of Care (Signed)
Immediate Anesthesia Transfer of Care Note  Patient: Terry Wright  Procedure(s) Performed: REVERSE SHOULDER ARTHROPLASTY (Left: Shoulder)  Patient Location: PACU  Anesthesia Type:GA combined with regional for post-op pain  Level of Consciousness: awake, alert , oriented, drowsy, and patient cooperative  Airway & Oxygen Therapy: Patient connected to face mask oxygen  Post-op Assessment: Report given to RN and Post -op Vital signs reviewed and stable  Post vital signs: stable  Last Vitals:  Vitals Value Taken Time  BP 126/70 06/14/23 0933  Temp 36.4 C 06/14/23 0933  Pulse 65 06/14/23 0934  Resp 16 06/14/23 0934  SpO2 99 % 06/14/23 0934  Vitals shown include unfiled device data.  Last Pain:  Vitals:   06/14/23 0617  TempSrc: Oral  PainSc:          Complications: No notable events documented.

## 2023-06-14 NOTE — Anesthesia Postprocedure Evaluation (Signed)
Anesthesia Post Note  Patient: Terry Wright  Procedure(s) Performed: REVERSE SHOULDER ARTHROPLASTY (Left: Shoulder)     Patient location during evaluation: PACU Anesthesia Type: Regional and General Level of consciousness: awake and alert Pain management: pain level controlled Vital Signs Assessment: post-procedure vital signs reviewed and stable Respiratory status: spontaneous breathing, nonlabored ventilation, respiratory function stable and patient connected to nasal cannula oxygen Cardiovascular status: blood pressure returned to baseline and stable Postop Assessment: no apparent nausea or vomiting Anesthetic complications: no   No notable events documented.  Last Vitals:  Vitals:   06/14/23 0940 06/14/23 0945  BP:  120/77  Pulse:  71  Resp:  18  Temp:    SpO2: 97% 98%    Last Pain:  Vitals:   06/14/23 0945  TempSrc:   PainSc: 0-No pain                 [place]

## 2023-06-14 NOTE — Op Note (Signed)
06/14/2023  9:23 AM  PATIENT:   Terry Wright  67 y.o. male  PRE-OPERATIVE DIAGNOSIS:  Left shoulder osteoarthritis with ankylosis and rotator cuff dysfunction  POST-OPERATIVE DIAGNOSIS: Same  PROCEDURE: Left shoulder reverse arthroplasty lysing a press-fit size 13 Arthrex stem with a neutral metaphysis, +3 constrained polyethylene insert, 39/+4 glenosphere and a small/+2 baseplate  SURGEON:  Zitlali Primm, Vania Rea M.D.  ASSISTANTS: Ralene Bathe, PA-C  Ralene Bathe, PA-C was utilized as an Geophysicist/field seismologist throughout this case, essential for help with positioning the patient, positioning extremity, tissue manipulation, implantation of the prosthesis, suture management, wound closure, and intraoperative decision-making.  ANESTHESIA:   General Endotracheal and interscalene block with Exparel  EBL: 450 cc  SPECIMEN: None  Drains: None   PATIENT DISPOSITION:  PACU - hemodynamically stable.    PLAN OF CARE: Discharge to home after PACU  Brief history:  Patient is a 67 year old gentleman with chronic and progressive increasing left shoulder pain related to severe osteoarthritis with profound loss of motion and based on clinical exam and ankylosis of the left shoulder.  Due to his increasing pain and functional limitations and failure to respond to blog attempts at conservative management, he is brought to the operating this time for planned left shoulder reverse arthroplasty  Preoperatively, I counseled the patient regarding treatment options and risks versus benefits thereof.  Possible surgical complications were all reviewed including potential for bleeding, infection, neurovascular injury, persistent pain, loss of motion, anesthetic complication, failure of the implant, and possible need for additional surgery. They understand and accept and agrees with our planned procedure.   Procedure detail:  After undergoing routine preop evaluation the patient received prophylactic  antibiotics and interscalene block with Exparel was established in the holding area by the anesthesia department.  Subsequently placed spine on the operating table and underwent the smooth induction of a general endotracheal anesthesia.  Placed into the beachchair position and appropriately padded and protected.  The left shoulder girdle region was sterilely prepped and draped in standard fashion.  Timeout was called.  Deltopectoral approach was made the left shoulder through an approximate 10 cm incision.  Skin flaps were elevated dissection carried deeply and the deltopectoral interval was then identified and developed however there was not a dominant vein and this ended up being essentially a intermuscular dissection with no easily definable interval which did create significantly more bleeding than usual.  All bleeding points were carefully coagulated.  Once this interval was fully developed the conjoined tendon was mobilized and retracted medially.  Long head biceps tendon was then tenodesed at the upper border the pectoralis major tendon with the proximal segment unroofed and excised.  Remnant of the rotator cuff superiorly was split to the base of the coracoid and the subscap was separated from the lesser tuberosity using electrocautery and the free margin was then tagged with pair of grasping suture tape sutures.  Capsular attachments were then divided from the anterior and inferior margins of the humeral neck and humeral head was then delivered through the wound.  An extra medullary guide was then used to outline the proposed humeral head resection which we performed with an oscillating saw at approximately 20 degrees of retroversion.  Large marginal osteophytes were then removed with a rondure.  A metal cap was then placed over the cut proximal humeral surface and we then placed a metal cap to protect the metaphysis and the glenoid was then exposed.  A circumferential labral resection was performed.  A  guidepin was then  directed into the center of the glenoid and the glenoid was then prepared with the central followed by the peripheral reamer to a several subchondral bony bed and large marginal osteophytes superiorly removed with a rondure.  Preparation completed with a drill and tapped for a 30 mm lag screw.  Our baseplate was then assembled and then inserted with vancomycin powder applied to the threads of the lag screw and excellent fixation was achieved.  All of the peripheral locking screws were then placed using standard technique with excellent fixation.  A 39/+4 glenosphere was then impacted onto the baseplate and the central locking screw was placed.  We then returned our attention to the metaphysis.  The canal was opened by hand reaming and we ultimately broached up to a size 13 stem.  A neutral metaphyseal reaming guide was then used to pare the metaphysis.  A trial reduction showed good motion stability and soft tissue balance.  This point the trial was then removed.  Our final implant was assembled.  On seating of the final implant we did note the development of a unicortical fracture in the calcar anteromedially.  With this finding we with drew the stem.  We placed a fiber cerclage to protect the metaphyseal bone which was appropriately tightened and at this point we then seated our implant to the appropriate level and then terminally tightened the fiber cerclage and did achieve excellent fixation with good diaphyseal fit distally.  Trial reduction was then performed and ultimately felt that the +3 poly gave is the best motion stability and soft tissue balance.  The trial poly was removed.  We did utilize a +3 constrained poly which was impacted on the implant and a final reduction showed excellent stability and soft tissue balance all much to our satisfaction.  The wound was then copes irrigated.  We confirmed good elasticity of the subscapularis after was mobilized.  It was repaired back to the  eyelets on the collar of the implant with the previously placed suture tape sutures.  Final irrigation was completed.  Hemostasis obtained.  The balance of the vancomycin powder was then sprayed liberally throughout the deep soft tissue planes.  The deltopectoral interval was reapproximated with a series of figure-of-eight number Vicryl sutures.  2-0 Monocryl used to close the subcu layer and intracuticular 3-0 Monocryl used to close the skin followed by Dermabond and Aquacel dressing.  The left arm was placed into a sling and the patient was awakened, extubated, and taken to the recovery in stable condition.  Senaida Lange MD   Contact # [phone]

## 2023-06-14 NOTE — Care Plan (Signed)
Ortho Bundle Case Management Note  Patient Details  Name: Terry Wright MRN: [id number] Date of Birth: 09-25-1955                  R Rev TSA on 06/14/23. DCP: Home with wife. DME: No needs. PT: Fay Records   DME Arranged:  N/A DME Agency:       Additional Comments: Please contact me with any questions of if this plan should need to change.    Despina Pole, CCM Case Manager, [facility]  [phone] 06/14/2023, 11:21 AM

## 2023-06-14 NOTE — Evaluation (Signed)
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  Now post-op: Left shoulder reverse arthroplasty lysing a press-fit size 13 Arthrex stem with a neutral metaphysis, +3 constrained polyethylene insert, 39/+4 glenosphere and a small/+2 baseplate. Under surgeon:  Senaida Lange M.D.  on 06/14/23. PMH: angina, anxiety, CAD s/p percuatneous coronary angioplasty & cath & stent, COPD, hx of DVT, GERD, HLD, HTN, PVD, DM, hx of STEMI, tobacco use (quit 2015), L-THA 2021 RT THA "last year"t.   Clinical Impression   Pt is a 67 year old male, s/p shoulder replacement without functional use of LT non-dominant upper extremity secondary to effects of surgery and interscalene block and shoulder precautions. Therapist provided education and instruction to patient and spouse in regards to exercises, precautions, positioning, donning upper extremity clothing and bathing while maintaining shoulder precautions, ice man and edema management and donning/doffing sling. Patient and spouse verbalized understanding and demonstrated as needed. Patient needed assistance to donn shirt, underwear, pants, socks and shoes and provided with instruction on compensatory strategies to perform ADLs. Patient limited by decreased ROM in LT shoulder so therefore will need some form of assistance at home. Patient and spouse verbalized and/or demonstrated understanding to all instruction. Patient to follow up with MD for further therapy needs.         If plan is discharge home, recommend the following: A little help with bathing/dressing/bathroom;Assist for transportation;Assistance with cooking/housework    Functional Status Assessment  Patient has had a recent decline in their functional  status and demonstrates the ability to make significant improvements in function in a reasonable and predictable amount of time.  Equipment Recommendations  None recommended by OT    Recommendations for Other Services       Precautions / Restrictions Precautions Precautions: Shoulder Type of Shoulder Precautions: Sling At all times except ADL / exercise  Non weight bearing Yes  AROM elbow, wrist and hand to tolerance Yes  AROM / PROM Forward Flexion other-see comments  AROM / PROM Abduction Other-see comments  AROM / PROM External Rotation Other-see comments  OT Consult Special Instructions no resisted internal rotation and no exercises for internal rotation Shoulder Interventions: Shoulder sling/immobilizer Precaution Booklet Issued: Yes (comment) Required Braces or Orthoses: Sling Restrictions Weight Bearing Restrictions: Yes LUE Weight Bearing: Non weight bearing Other Position/Activity Restrictions: If sitting in controlled environment, ok to come out of sling to give neck a break. Please sleep in it to protect until follow up in office.     OK to use operative arm for feeding, hygiene and ADLs.   Ok to instruct Pendulums and lap slides as exercises. Ok to use operative arm within the following parameters for ADL purposes     New ROM (7/42)   Ok for PROM, AAROM, AROM within pain tolerance and within the following ROM   ER 20   ABD 45   FE 60       Please instruct Ice machine usage      Mobility Bed Mobility               General bed mobility comments: In PACU recliner    Transfers Overall transfer level: Modified independent (Slower, cautious) Equipment used: None  Balance Overall balance assessment: Modified Independent                                         ADL either performed or assessed with clinical judgement   ADL Overall ADL's : Needs assistance/impaired Eating/Feeding: Modified independent   Grooming: Wash/dry  hands;Modified independent;Adhering to UE precautions;Standing   Upper Body Bathing: Contact guard assist;With caregiver independent assisting;Adhering to UE precautions;Cueing for compensatory techniques;Cueing for UE precautions Upper Body Bathing Details (indicate cue type and reason): Educated on dangle vs seesaw method. Lower Body Bathing: Minimal assistance;With caregiver independent assisting;Sitting/lateral leans   Upper Body Dressing : Minimal assistance;Cueing for sequencing;Cueing for compensatory techniques;Cueing for UE precautions;Adhering to UE precautions;With caregiver independent assisting   Lower Body Dressing: Minimal assistance   Toilet Transfer: Modified Independent   Toileting- Clothing Manipulation and Hygiene: Modified independent       Functional mobility during ADLs: Modified independent       Vision   Vision Assessment?: No apparent visual deficits     Perception         Praxis         Pertinent Vitals/Pain Pain Assessment Pain Assessment: No/denies pain (Numb.)     Extremity/Trunk Assessment Upper Extremity Assessment Upper Extremity Assessment: Right hand dominant;LUE deficits/detail LUE: Unable to fully assess due to immobilization (numb but able to open/close hand)   Lower Extremity Assessment Lower Extremity Assessment: Overall WFL for tasks assessed   Cervical / Trunk Assessment Cervical / Trunk Assessment: Normal   Communication Communication Communication: No apparent difficulties   Cognition Arousal: Alert Behavior During Therapy: WFL for tasks assessed/performed, Anxious Overall Cognitive Status: Within Functional Limits for tasks assessed                                       General Comments       Exercises     Shoulder Instructions Shoulder Instructions Donning/doffing shirt without moving shoulder: Minimal assistance;Caregiver independent with task;Patient able to independently direct  caregiver Method for sponge bathing under operated UE: Set-up;Supervision/safety;Caregiver independent with task;Patient able to independently direct caregiver Donning/doffing sling/immobilizer: Moderate assistance;Caregiver independent with task;Patient able to independently direct caregiver Correct positioning of sling/immobilizer: Modified independent;Caregiver independent with task;Patient able to independently direct caregiver Pendulum exercises (written home exercise program): Supervision/safety;Patient able to independently direct caregiver;Caregiver independent with task ROM for elbow, wrist and digits of operated UE: Independent;Caregiver independent with task;Patient able to independently direct caregiver (Demonstrated on RUE due to LUE numbness) Sling wearing schedule (on at all times/off for ADL's): Independent;Caregiver independent with task;Patient able to independently direct caregiver Proper positioning of operated UE when showering: Set-up;Caregiver independent with task;Patient able to independently direct caregiver Dressing change: Caregiver independent with task (Per Nursing education) Positioning of UE while sleeping: Set-up;Caregiver independent with task;Patient able to independently direct caregiver    Home Living Family/patient expects to be discharged to:: Private residence Living Arrangements: Spouse/significant other Available Help at Discharge: Family;Available 24 hours/day Type of Home: House Home Access: Stairs to enter [employer] of Steps: 2 Entrance Stairs-Rails: None Home Layout: Able to live on main level with bedroom/bathroom     Bathroom Shower/Tub: Arts development officer Toilet: Handicapped height     Home Equipment: Pharmacist, hospital (2 wheels);Cane - single point;BSC/3in1  Prior Functioning/Environment Prior Level of Function : Independent/Modified Independent;Driving;Working/employed             Mobility  Comments: No AD used recently. No falls. ADLs Comments: Retired IT sales professional, now works part time delivering medications.        OT Problem List:        OT Treatment/Interventions:      OT Goals(Current goals can be found in the care plan section) Acute Rehab OT Goals OT Goal Formulation: All assessment and education complete, DC therapy Potential to Achieve Goals: Good ADL Goals Pt/caregiver will Perform Home Exercise Program: Increased ROM;Left upper extremity;With written HEP provided (Per post-op instructions) Additional ADL Goal #1: Pt/spouse will demonstrate UE/LE dressing, donning/doffing of sling, correct positioning of LT UE, and compensatory strategies for LT axilla hygiene, as well as use of Ice man cryo cuff, while correctly following all shoulder post-op precautions/restrictions.  OT Frequency:      Co-evaluation              AM-PAC OT "6 Clicks" Daily Activity     Outcome Measure Help from another person eating meals?: None Help from another person taking care of personal grooming?: None Help from another person toileting, which includes using toliet, bedpan, or urinal?: A Little Help from another person bathing (including washing, rinsing, drying)?: A Little Help from another person to put on and taking off regular upper body clothing?: A Lot Help from another person to put on and taking off regular lower body clothing?: A Little 6 Click Score: 19   End of Session Nurse Communication: Other (comment) (Voiding x2 in bathroom. OT completed)  Activity Tolerance: Patient tolerated treatment well Patient left: in chair;with family/visitor present  OT Visit Diagnosis:  (Decreased ADLs)                Time: 1100-1151 OT Time Calculation (min): 51 min Charges:  OT General Charges $OT Visit: 1 Visit OT Evaluation $OT Eval Low Complexity: 1 Low OT Treatments $Self Care/Home Management : 23-37 mins  Victorino Dike, OT Acute Rehab Services Office:  [phone] 06/14/2023   Theodoro Clock 06/14/2023, 12:12 PM

## 2023-06-14 NOTE — H&P (Signed)
Terry Wright    Chief Complaint: Left shoulder osteoarthritis with rotator cuff dysfunction HPI: The patient is a 67 y.o. male with chronic and progressive increasing left shoulder pain related to severe osteoarthritis with significant bony deformity and a functional ankylosis of the shoulder.  Due to his increasing pain and functional limitations he is brought to the operating room at this time for planned left shoulder reverse arthroplasty  Past Medical History:  Diagnosis Date   Anginal pain (HCC) 2018   Anxiety    Arthritis    shoulders hips,    CAD S/P percutaneous coronary angioplasty 07/2016   a) 07/2016: 100% pCx -- DES PCI with 4.0 x 16 mm Synergy DES. 65% ostial OM 3 (relatively small caliber - Med Rx);; b) 03/27/18 PCI/DES to dLCX/OM3 x1. Patent pCX DES. 70% dRCA trifurcation lesion (Med Rx). Normal EF;; 07/2022: Patent Cx/LPAV Stents patent. ~mRCA 95%--> dRCA 100% (prior 90% bifurcation lesion). pLAD 55%, m-dLAD 45%. L-R Collaterals fill PAVPL & PDA.   COPD (chronic obstructive pulmonary disease) (HCC) 2018   mild   DVT (deep venous thrombosis) (HCC)    Right upper arm DVT on 2 occasions in the remote past   Dysrhythmia    V tac   Ejection fraction    LV function normal, echo, February, 2010   GERD (gastroesophageal reflux disease)    History of tobacco abuse 2015   43-pack-year history   Hyperlipidemia    Hypertension    Mitral regurgitation    Mild, echo, February, 2010   Palpitations    Mild in the past   Peripheral vascular disease (HCC) 1980s   DVT   Pre-diabetes    Statin intolerance    Felt poorly after Lipitor, Crestor, TriCor   STEMI involving left circumflex coronary artery (HCC) 08/04/2016   Occluded very large caliber codominant circumflex - PCI with single Synergy DES      Past Surgical History:  Procedure Laterality Date   CARDIAC CATHETERIZATION N/A 08/04/2016   Procedure: Left Heart Cath and Coronary Angiography;  Surgeon: Marykay Lex, MD;  Location: [facility] INVASIVE CV LAB;  Service: Cardiovascular;  Laterality: N/A: 100% pCx --> PCI. residual 65% pOM3 (small).  ~EF 35-45%  with lateral HK.   CARDIAC CATHETERIZATION N/A 08/04/2016   Procedure: CORONARY STENT INTERVENTION;  Surgeon: Marykay Lex, MD;  Location: [facility] INVASIVE CV LAB;  Service: Cardiovascular;  Laterality: N/A: pCx 100%-0%: Synergy DES 4.0 x 16 (4.6 mm)   CHONDROPLASTY Right 04/04/2017   Procedure: CHONDROPLASTY;  Surgeon: Jodi Geralds, MD;  Location: [facility] ORS;  Service: Orthopedics;  Laterality: Right;   CORONARY STENT INTERVENTION N/A 03/27/2018   Procedure: CORONARY STENT INTERVENTION;  Surgeon: Swaziland, Peter M, MD;  Location: [facility] INVASIVE CV LAB;  Service: Cardiovascular:: dCx-OM3 95%-0%: DES PCI - Synergy 2.25 x 16 mm.   GANGLION CYST EXCISION     oct 2017   HERNIA REPAIR     umbilical hernia 2017 oct   KNEE ARTHROSCOPY Right 04/04/2017   Procedure: ARTHROSCOPY RIGHT KNEE, WITH MEIDAL FEMORAL CONDYLE PATELLAR MEDIAL FEMORAL JOINT PLICA EXCISION ;  Surgeon: Jodi Geralds, MD;  Location: [facility];  Service: Orthopedics;  Laterality: Right;   LEFT HEART CATH AND CORONARY ANGIOGRAPHY N/A 03/27/2018   Procedure: LEFT HEART CATH AND CORONARY ANGIOGRAPHY;  Surgeon: Swaziland, Peter M, MD;  Location: [facility] INVASIVE CV LAB;  Service: Cardiovascular:   2 vessel obstructive CAD: - 95% dLCx/OM3 --Successful PCI of the dLCx/OM3 with SYNERGY DES 2.25X16, -70% distal  RCA at trifurcation (Med Management).  Widely patent pLCx DES. Low normal LV function (50-55%), Normal LVEDP    LEFT HEART CATH AND CORONARY ANGIOGRAPHY N/A 08/30/2022   Procedure: LEFT HEART CATH AND CORONARY ANGIOGRAPHY;  Surgeon: Marykay Lex, MD;  Location: [facility] INVASIVE CV LAB;  Service: CV. ::pLAD 55%, m-d LAD 45%. pRCA-dRCA 95% (SubTO) -> dRCA 100% CTO prior to Bifurcation w/ 90-95% into PDA & PAV - both fill via L-R collaterals. pCx DES & LPAV stent patent.  No AS. LVEDP 16 mmHg.   NM MYOVIEW LTD  08/23/2022    INTERMEDIATE RISK.  EF~39%.  Infero-septal wall hypokinesis.  No ST segment changes.  Partially veritable medium sized, moderate severity defect in the mid to basal inferoseptal wall.; Considered Infarct w/ Peri-Infarct Ischemia. => Referred for Cath.   TOTAL HIP ARTHROPLASTY Left 04/08/2020   Procedure: TOTAL HIP ARTHROPLASTY ANTERIOR APPROACH;  Surgeon: Durene Romans, MD;  Location: [facility];  Service: Orthopedics;  Laterality: Left;  70 mins   TOTAL HIP ARTHROPLASTY Right 05/02/2022   Procedure: TOTAL HIP ARTHROPLASTY ANTERIOR APPROACH;  Surgeon: Durene Romans, MD;  Location: [facility];  Service: Orthopedics;  Laterality: Right;  90   TRANSTHORACIC ECHOCARDIOGRAM  08/22/2022   Normal LV Size &Fxn - EF ~ 55-65%. No RWMA. Mild Conc LVH with Severe LA dilation (but ? Normal Diastolic Fxn). Normal RV size & Fxn with normal RVP & RAP. Normal MV & AoV.   TRANSTHORACIC ECHOCARDIOGRAM  01/11/2017   a) a) 07/2016: post STEMI: EF 40-45%. Mild concentric LVH. Severe HK of basal-mid inferolateral wall consistent with infarct in this distribution. GR 1 DD.;; b) 12/2016: EF 50-55%. GR 1 DD. Mild to moderate LA dilation.    Family History  Problem Relation Age of Onset   Heart attack Mother        x2- lived into 56s   Hypertension Father        lived into 33s   Other Brother        killed in [place]    Social History:  reports that he quit smoking about 9 years ago. His smoking use included cigarettes. He started smoking about 52 years ago. He has a 43 pack-year smoking history. He has never used smokeless tobacco. He reports current alcohol use of about 7.0 standard drinks of alcohol per week. He reports that he does not use drugs.  BMI: Estimated body mass index is 27.94 kg/m as calculated from the following:   Height as of 06/05/23: 6' (1.829 m).   Weight as of 06/05/23: 93.4 kg.  Lab Results  Component Value Date   ALBUMIN 4.4 04/20/2023   Diabetes:   Patient has a diagnosis of diabetes,  Lab  Results  Component Value Date   HGBA1C 6.5 04/20/2023   Smoking Status:       Medications Prior to Admission  Medication Sig Dispense Refill   acetaminophen (TYLENOL) 650 MG CR tablet Take 1,300 mg by mouth every 8 (eight) hours as needed for pain.     amLODipine (NORVASC) 5 MG tablet Take 5 mg by mouth daily.     carvedilol (COREG) 12.5 MG tablet TAKE 1 TABLET BY MOUTH 2 TIMES DAILY 180 tablet 1   clopidogrel (PLAVIX) 75 MG tablet TAKE 1 TABLET BY MOUTH EVERY DAY 90 tablet 0   LORazepam (ATIVAN) 0.5 MG tablet TAKE 1 TABLET BY MOUTH AT BEDTIME AS NEEDED FOR ANXIETY. DO not drive FOR 8 hours AFTER taking (Patient taking differently: Take 0.5 mg  by mouth at bedtime.) 90 tablet 1   nitroGLYCERIN (NITROSTAT) 0.4 MG SL tablet place 1 TABLET UNDER THE TONGUE EVERY 5 MINUTES AS NEEDED FOR CHEST PAIN 25 tablet 0   pantoprazole (PROTONIX) 40 MG tablet TAKE 1 TABLET BY MOUTH EVERY DAY 90 tablet 3   ranolazine (RANEXA) 500 MG 12 hr tablet TAKE 1 TABLET BY MOUTH 2 TIMES DAILY 90 tablet 7   rosuvastatin (CRESTOR) 40 MG tablet TAKE 1 TABLET BY MOUTH EVERY DAY (Patient taking differently: Take 40 mg by mouth every evening.) 90 tablet 3   triamcinolone lotion (KENALOG) 0.1 % Apply 1 application  topically 2 (two) times daily as needed (skin irritation). Mix lotion with cream and apply twice daily to affected area       Physical Exam: Left shoulder demonstrates painful and profoundly restricted motion as noted at recent office visits.  He maintains good strength to manual muscle testing.  He is neurovascular intact in left upper extremity.  Radiographs  Imaging studies confirm severe osteoarthritis with significant bony deformity of the left shoulder  Vitals     Assessment/Plan  Impression: Left shoulder osteoarthritis with rotator cuff dysfunction  Plan of Action: Procedure(s): REVERSE SHOULDER ARTHROPLASTY  Kealani Leckey M Marylynne Keelin 06/14/2023, 5:42 AM Contact # [phone]

## 2023-06-14 NOTE — Anesthesia Procedure Notes (Addendum)
Anesthesia Regional Block: Interscalene brachial plexus block   Pre-Anesthetic Checklist: , timeout performed,  Correct Patient, Correct Site, Correct Laterality,  Correct Procedure, Correct Position, site marked,  Risks and benefits discussed,  Surgical consent,  Pre-op evaluation,  At surgeon's request and post-op pain management  Laterality: Left  Prep: chloraprep       Needles:  Injection technique: Single-shot  Needle Type: Echogenic Stimulator Needle     Needle Length: 9cm  Needle Gauge: 21     Additional Needles:   Procedures:,,,, ultrasound used (permanent image in chart),,    Narrative:  Start time: 06/14/2023 6:45 AM End time: 06/14/2023 6:50 AM Injection made incrementally with aspirations every 5 mL.  Performed by: Personally  Anesthesiologist: Perla Nation, MD  Additional Notes: Discussed risks and benefits of the nerve block in detail, including but not limited vascular injury, permanent nerve damage and infection.   Patient tolerated the procedure well. Local anesthetic introduced in an incremental fashion under minimal resistance after negative aspirations. No paresthesias were elicited. After completion of the procedure, no acute issues were identified and patient continued to be monitored by RN.

## 2023-06-14 NOTE — Discharge Instructions (Signed)
? ?Terry Wright, M.D., F.A.A.O.S. ?Orthopaedic Surgery ?Specializing in Arthroscopic and Reconstructive ?Surgery of the Shoulder ?[phone] ?[address] - [place],  [address] - Fax [phone] ? ? ?POST-OP TOTAL SHOULDER REPLACEMENT INSTRUCTIONS ? ?1. Follow up in the office for your first post-op appointment 10-14 days from the date of your surgery. If you do not already have a scheduled appointment, our office will contact you to schedule. ? ?2. The bandage over your incision is waterproof. You may begin showering with this dressing on. You may leave this dressing on until first follow up appointment within 2 weeks. We prefer you leave this dressing in place until follow up however after 5-7 days if you are having itching or skin irritation and would like to remove it you may do so. Go slow and tug at the borders gently to break the bond the dressing has with the skin. At this point if there is no drainage it is okay to go without a bandage or you may cover it with a light guaze and tape. You can also expect significant bruising around your shoulder that will drift down your arm and into your chest wall. This is very normal and should resolve over several days. ? ? 3. Wear your sling/immobilizer at all times except to perform the exercises below or to occasionally let your arm dangle by your side to stretch your elbow. You also need to sleep in your sling immobilizer until instructed otherwise. It is ok to remove your sling if you are sitting in a controlled environment and allow your arm to rest in a position of comfort by your side or on your lap with pillows to give your neck and skin a break from the sling. You may remove it to allow arm to dangle by side to shower. If you are up walking around and when you go to sleep at night you need to wear it. ? ?4. Range of motion to your elbow, wrist, and hand are encouraged 3-5 times daily. Exercise to your hand and fingers helps to reduce  swelling you may experience. ? ? ?5. Prescriptions for a pain medication and a muscle relaxant are provided for you. It is recommended that if you are experiencing pain that you pain medication alone is not controlling, add the muscle relaxant along with the pain medication which can give additional pain relief. The first 1-2 days is generally the most severe of your pain and then should gradually decrease. As your pain lessens it is recommended that you decrease your use of the pain medications to an "as needed basis'" only and to always comply with the recommended dosages of the pain medications. ? ?6. Pain medications can produce constipation along with their use. If you experience this, the use of an over the counter stool softener or laxative daily is recommended.  ? ?7. For additional questions or concerns, please do not hesitate to call the office. If after hours there is an answering service to forward your concerns to the physician on call. ? ?8.Pain control following an exparel block ? ?To help control your post-operative pain you received a nerve block  performed with Exparel which is a long acting anesthetic (numbing agent) which can provide pain relief and sensations of numbness (and relief of pain) in the operative shoulder and arm for up to 3 days. Sometimes it provides mixed relief, meaning you may still have numbness in certain areas of the arm but can still be able to   move  parts of that arm, hand, and fingers. We recommend that your prescribed pain medications  be used as needed. We do not feel it is necessary to "pre medicate" and "stay ahead" of pain.  Taking narcotic pain medications when you are not having any pain can lead to unnecessary and potentially dangerous side effects.   ? ?9. Use the ice machine as much as possible in the first 5-7 days from surgery, then you can wean its use to as needed. The ice typically needs to be replaced every 6 hours, instead of ice you can actually freeze  water bottles to put in the cooler and then fill water around them to avoid having to purchase ice. You can have spare water bottles freezing to allow you to rotate them once they have melted. Try to have a thin shirt or light cloth or towel under the ice wrap to protect your skin.  ? ?FOR ADDITIONAL INFO ON ICE MACHINE AND INSTRUCTIONS GO TO THE WEBSITE AT ? ?https://www.djogl[employer].com/products/donjoy/donjoy-iceman-classic3 ? ?10.  We recommend that you avoid any dental work or cleaning in the first 3 months following your joint replacement. This is to help minimize the possibility of infection from the bacteria in your mouth that enters your bloodstream during dental work. We also recommend that you take an antibiotic prior to your dental work for the first year after your shoulder replacement to further help reduce that risk. Please simply contact our office for antibiotics to be sent to your pharmacy prior to dental work. ? ?11. Dental Antibiotics: ? ?We recommend waiting at least 3 months for any dental work even cleanings unless there is a dental emergency. We also recommend  prophylactic antibiotics for all dental procdeures  the first year following your joint replacement. In some exceptions we recommend them to be used lifelong. We will provide you with that prescription in follow up office visits, or you can call our office. ? ?Exceptions are as follows: ? ?1. History of prior total joint infection ? ?2. Severely immunocompromised (Organ Transplant, cancer chemotherapy, Rheumatoid biologic ?meds such as Humera) ? ?3. Poorly controlled diabetes (A1C &gt; 8.0, blood glucose over 200) ? ? ?POST-OP EXERCISES ? ?Pendulum Exercises ? ?Perform pendulum exercises while standing and bending at the waist. Support your uninvolved arm on a table or chair and allow your operated arm to hang freely. Make sure to do these exercises passively - not using you shoulder muscles. These exercises can be performed once your  nerve block effects have worn off. ? ?Repeat 20 times. Do 3 sessions per day. ? ? ?  ?

## 2023-06-15 ENCOUNTER — Encounter (HOSPITAL_COMMUNITY): Payer: Self-pay | Admitting: Orthopedic Surgery

## 2023-06-25 DIAGNOSIS — Z471 Aftercare following joint replacement surgery: Secondary | ICD-10-CM | POA: Diagnosis not present

## 2023-06-25 DIAGNOSIS — Z96612 Presence of left artificial shoulder joint: Secondary | ICD-10-CM | POA: Diagnosis not present

## 2023-07-02 ENCOUNTER — Other Ambulatory Visit: Payer: Self-pay | Admitting: Family Medicine

## 2023-07-05 ENCOUNTER — Other Ambulatory Visit: Payer: Self-pay | Admitting: Family Medicine

## 2023-07-09 ENCOUNTER — Ambulatory Visit: Payer: PPO | Admitting: Cardiology

## 2023-07-12 ENCOUNTER — Ambulatory Visit: Payer: PPO | Attending: Cardiology | Admitting: Cardiology

## 2023-07-12 ENCOUNTER — Encounter: Payer: Self-pay | Admitting: Cardiology

## 2023-07-12 VITALS — BP 122/64 | HR 72 | Ht 72.0 in | Wt 208.0 lb

## 2023-07-12 DIAGNOSIS — E1159 Type 2 diabetes mellitus with other circulatory complications: Secondary | ICD-10-CM

## 2023-07-12 DIAGNOSIS — I872 Venous insufficiency (chronic) (peripheral): Secondary | ICD-10-CM | POA: Diagnosis not present

## 2023-07-12 DIAGNOSIS — E785 Hyperlipidemia, unspecified: Secondary | ICD-10-CM

## 2023-07-12 DIAGNOSIS — I25119 Atherosclerotic heart disease of native coronary artery with unspecified angina pectoris: Secondary | ICD-10-CM | POA: Diagnosis not present

## 2023-07-12 DIAGNOSIS — I493 Ventricular premature depolarization: Secondary | ICD-10-CM | POA: Diagnosis not present

## 2023-07-12 DIAGNOSIS — I255 Ischemic cardiomyopathy: Secondary | ICD-10-CM

## 2023-07-12 DIAGNOSIS — I700 Atherosclerosis of aorta: Secondary | ICD-10-CM | POA: Diagnosis not present

## 2023-07-12 DIAGNOSIS — I2121 ST elevation (STEMI) myocardial infarction involving left circumflex coronary artery: Secondary | ICD-10-CM | POA: Diagnosis not present

## 2023-07-12 DIAGNOSIS — I10 Essential (primary) hypertension: Secondary | ICD-10-CM

## 2023-07-12 MED ORDER — AMLODIPINE BESYLATE 2.5 MG PO TABS
2.5000 mg | ORAL_TABLET | Freq: Every day | ORAL | 3 refills | Status: DC
Start: 2023-07-12 — End: 2024-06-18

## 2023-07-12 NOTE — Assessment & Plan Note (Signed)
Significant PVC burden noted on monitor there was seen during his catheterization.  Symptomatically he has improved on this current dose of carvedilol.  Will continue to monitor.  He did not do well on higher dose of carvedilol the past, but may have to titrate at least a little bit further.

## 2023-07-12 NOTE — Assessment & Plan Note (Addendum)
History of anterior STEMI with LAD PCI. Right coronary artery occluded with good collateral flow. No current angina symptoms on current medications including amlodipine (currently 5 mg daily), Ranexa 500 mg twice daily and carvedilol 12.5 mg twice daily (unable to really titrate further because of fatigue. He is also on maintenance Plavix for extensive CAD the occlusion of the RCA. He is on rosuvastatin for lipids with well-controlled lipids as of April 2024  -Continue current medications but will reduce amlodipine to 2.5 mg daily,  -Consider switching from Amlodipine to Losartan or Valsartan at next visit if no angina symptoms present. -Okay to hold Plavix 5 to 7 days preop restrictions or procedures.  Provide he does not have any recurrent symptoms of angina or worsening heart berry symptoms, I think we can hold off on the CTO PCI of the RCA.  It would be a very long segment of intervention and the collaterals are quite robust.

## 2023-07-12 NOTE — Progress Notes (Signed)
Cardiology Office Note:  .   Date:  07/12/2023  ID:  Terry Wright, DOB 09-23-1955, MRN [id number] PCP: Shelva Majestic, MD  [facility] Providers Cardiologist:  Bryan Lemma, MD     Chief Complaint  Patient presents with   Follow-up    52-month follow-up.  Feeling well.  Just that his left shoulder surgery   Coronary Artery Disease    No active angina   Palpitations    Palpitations pretty well-controlled.  Does not seem to be feeling the PVCs.    Patient Profile: .     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  Cardiac History: CRFs: HTN, HLD, Pre-DM, former smoker CAD: Inferior-posterior STEMI/VT arrest 08/06/2016 --> Shock x 2 for VT - ROSC -> CATH - 100% pCx - DES PCI (Synergy DES 4.0 x 16 - 4.6 mm)   Initial EF by LV gram was 35-40% - Increased to 40 and 45% by echo 2-D echo 01/11/2017: EF 50-55%. GR 1 DD. Mild to moderate LA dilation. Unstable Angina - Cath 03/27/2018: patent pCx stent --> Cx-OM2 95% (Culprit) -- DES PCI. ~70% dRCA trifurcation (med Rx). Abn EKG Jan 2024 -> Cath, reviewed below -> Diffuse mid-distal RCA-> CTO, fills via L-R collaterals from LAD-septals & dLCx.  Both LCx stents patent. Moderate pLDA 55% & mid LAD 45%.  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 S/p L Shoulder Arthroplasty    [name] was last seen on Dec 19, 2022 for follow-up.  We discussed his Zio patch monitor which showed mostly sinus rhythm with appreciated block 11.3% PVCs and a couple atrial runs.  He said he is doing fairly well.  Palpitations are calming down.  He had reduced the dose of amlodipine to 5 mg because of swelling.  He also was using some lorazepam to help him sleep.  This was helping his anxiety.  He also said that exercising helped his palpitations.  Unfortunate  is not quite as active now that he had his hip STEMI done and was pending left shoulder surgery. => No changes were made.  -> He had left shoulder arthroplasty on 04/14/2023.  Subjective  Discussed the use of AI scribe software for clinical note transcription with the patient, who gave verbal consent to proceed.  History of Present Illness   The patient, with a history of CAD - PCI (RCA CTO) with CRFs of HTN & HLD, & Frequent PVCs who presents as follow up after recent shoulder replacement. He reports a successful recovery from the surgery and was scheduled to start physical therapy.  "Terry Wright" has a history of coronary artery disease, with a complete occlusion of the right coronary artery identified during a catheterization procedure in January. Despite this, he reported no symptoms of angina, heart racing, or dizziness.  CV ROS: No chest pain/pressure of dyspnea with rest or exertion.; PND or orthopnea. + edema with Venous stasis dermatitis. No syncope/near syncope, TIA/Amaurosis/CVA symptoms or claudication. No Rapid or irregular heartbeats - palpitations are notably improved on beta blocker.    He does report a new symptom of discoloration and mild swelling in the lower legs, which he attributed to his current medication, Amlodipine. He expresses interest in reducing the dosage of this medication to alleviate these symptoms.   He has been managing his health well,  maintaining a stable weight, and keeping up with his medications and regular check-ups.  The patient's overall condition appears stable, with no new significant symptoms or complications reported since the last visit. He is proactive in managing his health and is compliant with his medication regimen.      ROS:  Review of Systems - Negative except recovering from left shoulder surgery.  Ready to start rehab.  Has some swelling in the elbow because of being in the sling.  Still bothered little bit by his hips which limits his walking  but but otherwise try to get more active.    Objective   Studies Reviewed: Marland Kitchen        Lab Results  Component Value Date   CHOL 115 11/17/2022   HDL 41.20 11/17/2022   LDLCALC 57 11/17/2022   LDLDIRECT 62.0 04/20/2023   TRIG 87.0 11/17/2022   CHOLHDL 3 11/17/2022   Lab Results  Component Value Date   NA 138 06/05/2023   K 4.1 06/05/2023   CREATININE 0.96 06/05/2023   GFRNONAA >60 06/05/2023   GLUCOSE 127 (H) 06/05/2023   Lab Results  Component Value Date   HGBA1C 6.5 04/20/2023    Echocardiogram: Ejection fraction (EF) 55-60%, normal right ventricular (RV) motion (12/2020) Echocardiogram: Ejection fraction (EF) roughly 55% (07/05/2022) Echocardiogram: Ejection fraction (EF) 30-35%, moderate left ventricular (LV) dysfunction (08/2020)  ECHO 07/2022:  1. Left ventricular ejection fraction, by estimation, is 55 to 60% . Left ventricular ejection fraction by 3D volume is 66 % . The left ventricle has normal function. The left ventricle has no regional wall motion abnormalities. There is mild concentric left ventricular hypertrophy. Left ventricular diastolic parameters were normal.  2. Right ventricular systolic function is normal. The right ventricular size is normal.  3. Left atrial size was severely dilated.  4. The mitral valve is normal in structure. Trivial mitral valve regurgitation. No evidence of mitral stenosis.  5. The aortic valve is tricuspid. Aortic valve regurgitation is not visualized. No aortic stenosis is present. . 6. The inferior vena cava is normal in size with greater than 50% respiratory variability, suggesting right atrial pressure of 3 mmHg.  Comparison( s) : No significant change from prior study. Prior images reviewed side by side. Very frequent PVCs are recorded during the study.  Cath 07/2022: Multivessel disease:. Culprit lesion is mid RCA severe 95% stenosis tapering to 100% CTO just possible to the bifurcation into PDA PL (left right collaterals  noted) Mild progression of proximal LAD stenosis 55% with mid LAD percent Widely patent proximal Cx stent as well as AV groove Cx stent LVEDP 16 mmHg  Frequent PVCs with bigeminy   Risk Assessment/Calculations:               Physical Exam:   VS:  BP 122/64 (BP Location: Left Arm, Patient Position: Sitting, Cuff Size: Normal)   Pulse 72   Ht 6' (1.829 m)   Wt 208 lb (94.3 kg)   BMI 28.21 kg/m    Wt Readings from Last 3 Encounters:  07/12/23 208 lb (94.3 kg)  06/14/23 206 lb (93.4 kg)  06/05/23 206 lb (93.4 kg)    GEN: Well nourished, well developed in no acute distress; held appearing.  Well-groomed. NECK: No JVD; No carotid bruits CARDIAC: Normal S1, S2; RRR, no murmurs, rubs, gallops RESPIRATORY:  Clear to auscultation without rales, wheezing or rhonchi ; nonlabored, good air movement. ABDOMEN: Soft, non-tender, non-distended EXTREMITIES:  No edema; left arm in a sling following  shoulder surgery.;  He showed a picture of his feet that CLEAR signs of venous stasis dermatitis with some discoloration and brawny effect.  However no evidence of significant edema.    ASSESSMENT AND PLAN: .    Problem List Items Addressed This Visit       Cardiology Problems   Coronary artery disease involving native coronary artery of native heart with angina pectoris (HCC) - Primary (Chronic)   History of anterior STEMI with LAD PCI. Right coronary artery occluded with good collateral flow. No current angina symptoms on current medications including amlodipine (currently 5 mg daily), Ranexa 500 mg twice daily and carvedilol 12.5 mg twice daily (unable to really titrate further because of fatigue. He is also on maintenance Plavix for extensive CAD the occlusion of the RCA. He is on rosuvastatin for lipids with well-controlled lipids as of April 2024  -Continue current medications but will reduce amlodipine to 2.5 mg daily,  -Consider switching from Amlodipine to Losartan or Valsartan at next  visit if no angina symptoms present. -Okay to hold Plavix 5 to 7 days preop restrictions or procedures.  Provide he does not have any recurrent symptoms of angina or worsening heart berry symptoms, I think we can hold off on the CTO PCI of the RCA.  It would be a very long segment of intervention and the collaterals are quite robust.      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  Not currently on any medications.  Potential to consider SGLT2 inhibitor      Relevant Medications   amLODipine (NORVASC) 2.5 MG tablet   Dyslipidemia, goal LDL <55 (Chronic)   After having try to cut down on his rosuvastatin, went back up to 40 mg his most recent LDL was 57.  Should be due for labs rechecked soon next year. -Continue rosuvastatin 40 mg daily.  Monitor.      Relevant Medications   amLODipine (NORVASC) 2.5 MG tablet   Essential hypertension (Chronic)   Blood pressure well-controlled on current meds. -Continue current dose of carvedilol, but will reduce dose of Norvasc to 2.5 mg daily because of worsening edema. -If BP were to increase further, but probably try to convert from Calcium Channel Blocker (CCB) to ARB as long as is not having any worsening angina.      Relevant Medications   amLODipine (NORVASC) 2.5 MG tablet   Frequent PVCs (Chronic)   Significant PVC burden noted on monitor there was seen during his catheterization.  Symptomatically he has improved on this current dose of carvedilol.  Will continue to monitor.  He did not do well on higher dose of carvedilol the past, but may have to titrate at least a little bit further.      Relevant Medications   amLODipine (NORVASC) 2.5 MG tablet   Ischemic cardiomyopathy (Chronic)   Resolved      Relevant Medications   amLODipine (NORVASC) 2.5 MG tablet   STEMI involving left circumflex coronary artery (HCC) -complicated by  cardiac arrest (Chronic)   Relevant Medications   amLODipine (NORVASC) 2.5 MG tablet   Thoracic aortic atherosclerosis (HCC) (Chronic)   Continue BP control and lipid management.      Relevant Medications   amLODipine (NORVASC) 2.5 MG tablet   Venous stasis dermatitis of both lower extremities   Noted discoloration and mild swelling in lower legs. Likely due to venous reflux  and possibly exacerbated by Amlodipine. -Reduce Amlodipine to 2.5mg  daily. -Consider use of compression socks and elevation of legs when possible.      Relevant Medications   amLODipine (NORVASC) 2.5 MG tablet    Follow-Up: Return in about 6 months (around 01/10/2024) for Routine follow up with me. -Continue with scheduled lung screening in January. -Check cholesterol levels before next visit if not done by primary care provider. -Schedule follow-up visit in six months.    Total time spent: 23 min spent with patient + 21 min spent charting = 44 min I spent 44 minutes in the care of Cleda Mccreedy Matin today including reviewing labs (2 min), reviewing studies (4 min), face to face time discussing treatment options (23), and 15 minutes documenting the note.    Signed, Marykay Lex, MD, MS Bryan Lemma, M.D., M.S. Interventional Cardiologist  [facility]  Pager # [phone] Phone # [phone] [address] [place], [place] [address]

## 2023-07-12 NOTE — Assessment & Plan Note (Signed)
Resolved

## 2023-07-12 NOTE — Patient Instructions (Signed)
Medication Instructions:    Decrease Amlodipine 2.5 mg daily     *If you need a refill on your cardiac medications before your next appointment, please call your pharmacy*   Lab Work: Not needed    Testing/Procedures:  Not needed  Follow-Up: At [facility], you and your health needs are our priority.  As part of our continuing mission to provide you with exceptional heart care, we have created designated Provider Care Teams.  These Care Teams include your primary Cardiologist (physician) and Advanced Practice Providers (APPs -  Physician Assistants and Nurse Practitioners) who all work together to provide you with the care you need, when you need it.     Your next appointment:   6 month(s)  The format for your next appointment:   In Person  Provider:   Bryan Lemma, MD

## 2023-07-12 NOTE — Assessment & Plan Note (Signed)
Most recent A1c was 6.5.  Not currently on any medications.  Potential to consider SGLT2 inhibitor

## 2023-07-12 NOTE — Assessment & Plan Note (Signed)
Noted discoloration and mild swelling in lower legs. Likely due to venous reflux and possibly exacerbated by Amlodipine. -Reduce Amlodipine to 2.5mg  daily. -Consider use of compression socks and elevation of legs when possible.

## 2023-07-12 NOTE — Assessment & Plan Note (Signed)
Blood pressure well-controlled on current meds. -Continue current dose of carvedilol, but will reduce dose of Norvasc to 2.5 mg daily because of worsening edema. -If BP were to increase further, but probably try to convert from Calcium Channel Blocker (CCB) to ARB as long as is not having any worsening angina.

## 2023-07-12 NOTE — Assessment & Plan Note (Signed)
After having try to cut down on his rosuvastatin, went back up to 40 mg his most recent LDL was 57.  Should be due for labs rechecked soon next year. -Continue rosuvastatin 40 mg daily.  Monitor.

## 2023-07-12 NOTE — Assessment & Plan Note (Signed)
Continue BP control and lipid management.

## 2023-07-16 DIAGNOSIS — M25512 Pain in left shoulder: Secondary | ICD-10-CM | POA: Diagnosis not present

## 2023-07-16 DIAGNOSIS — M6281 Muscle weakness (generalized): Secondary | ICD-10-CM | POA: Diagnosis not present

## 2023-07-16 DIAGNOSIS — M25612 Stiffness of left shoulder, not elsewhere classified: Secondary | ICD-10-CM | POA: Diagnosis not present

## 2023-07-23 DIAGNOSIS — M25512 Pain in left shoulder: Secondary | ICD-10-CM | POA: Diagnosis not present

## 2023-07-23 DIAGNOSIS — M6281 Muscle weakness (generalized): Secondary | ICD-10-CM | POA: Diagnosis not present

## 2023-07-23 DIAGNOSIS — M25612 Stiffness of left shoulder, not elsewhere classified: Secondary | ICD-10-CM | POA: Diagnosis not present

## 2023-07-30 DIAGNOSIS — M25612 Stiffness of left shoulder, not elsewhere classified: Secondary | ICD-10-CM | POA: Diagnosis not present

## 2023-07-30 DIAGNOSIS — M25512 Pain in left shoulder: Secondary | ICD-10-CM | POA: Diagnosis not present

## 2023-07-30 DIAGNOSIS — M6281 Muscle weakness (generalized): Secondary | ICD-10-CM | POA: Diagnosis not present

## 2023-08-02 DIAGNOSIS — M6281 Muscle weakness (generalized): Secondary | ICD-10-CM | POA: Diagnosis not present

## 2023-08-02 DIAGNOSIS — M25512 Pain in left shoulder: Secondary | ICD-10-CM | POA: Diagnosis not present

## 2023-08-02 DIAGNOSIS — M25612 Stiffness of left shoulder, not elsewhere classified: Secondary | ICD-10-CM | POA: Diagnosis not present

## 2023-08-06 DIAGNOSIS — M25612 Stiffness of left shoulder, not elsewhere classified: Secondary | ICD-10-CM | POA: Diagnosis not present

## 2023-08-06 DIAGNOSIS — M25512 Pain in left shoulder: Secondary | ICD-10-CM | POA: Diagnosis not present

## 2023-08-06 DIAGNOSIS — M6281 Muscle weakness (generalized): Secondary | ICD-10-CM | POA: Diagnosis not present

## 2023-08-07 ENCOUNTER — Ambulatory Visit (HOSPITAL_BASED_OUTPATIENT_CLINIC_OR_DEPARTMENT_OTHER)
Admission: RE | Admit: 2023-08-07 | Discharge: 2023-08-07 | Disposition: A | Discharge status: Home / Self Care | Payer: PPO | Source: Ambulatory Visit | Attending: Acute Care | Admitting: Acute Care

## 2023-08-07 DIAGNOSIS — Z122 Encounter for screening for malignant neoplasm of respiratory organs: Secondary | ICD-10-CM | POA: Insufficient documentation

## 2023-08-07 DIAGNOSIS — Z87891 Personal history of nicotine dependence: Secondary | ICD-10-CM | POA: Insufficient documentation

## 2023-08-09 ENCOUNTER — Other Ambulatory Visit: Payer: Self-pay | Admitting: Cardiology

## 2023-08-09 DIAGNOSIS — M6281 Muscle weakness (generalized): Secondary | ICD-10-CM | POA: Diagnosis not present

## 2023-08-09 DIAGNOSIS — M25612 Stiffness of left shoulder, not elsewhere classified: Secondary | ICD-10-CM | POA: Diagnosis not present

## 2023-08-09 DIAGNOSIS — M25512 Pain in left shoulder: Secondary | ICD-10-CM | POA: Diagnosis not present

## 2023-08-13 ENCOUNTER — Encounter: Payer: Self-pay | Admitting: Cardiology

## 2023-08-13 DIAGNOSIS — M25612 Stiffness of left shoulder, not elsewhere classified: Secondary | ICD-10-CM | POA: Diagnosis not present

## 2023-08-13 DIAGNOSIS — M6281 Muscle weakness (generalized): Secondary | ICD-10-CM | POA: Diagnosis not present

## 2023-08-13 DIAGNOSIS — M25512 Pain in left shoulder: Secondary | ICD-10-CM | POA: Diagnosis not present

## 2023-08-14 MED ORDER — RANOLAZINE ER 500 MG PO TB12: 500.0000 mg | ORAL_TABLET | Freq: Two times a day (BID) | ORAL | 3 refills | Status: AC

## 2023-08-16 ENCOUNTER — Other Ambulatory Visit: Payer: Self-pay | Admitting: Acute Care

## 2023-08-16 ENCOUNTER — Encounter: Payer: Self-pay | Admitting: Cardiology

## 2023-08-16 DIAGNOSIS — M6281 Muscle weakness (generalized): Secondary | ICD-10-CM | POA: Diagnosis not present

## 2023-08-16 DIAGNOSIS — Z122 Encounter for screening for malignant neoplasm of respiratory organs: Secondary | ICD-10-CM

## 2023-08-16 DIAGNOSIS — M25512 Pain in left shoulder: Secondary | ICD-10-CM | POA: Diagnosis not present

## 2023-08-16 DIAGNOSIS — Z87891 Personal history of nicotine dependence: Secondary | ICD-10-CM

## 2023-08-16 DIAGNOSIS — M25612 Stiffness of left shoulder, not elsewhere classified: Secondary | ICD-10-CM | POA: Diagnosis not present

## 2023-08-20 ENCOUNTER — Other Ambulatory Visit: Payer: Self-pay | Admitting: Cardiology

## 2023-08-20 DIAGNOSIS — M25512 Pain in left shoulder: Secondary | ICD-10-CM | POA: Diagnosis not present

## 2023-08-20 DIAGNOSIS — M25612 Stiffness of left shoulder, not elsewhere classified: Secondary | ICD-10-CM | POA: Diagnosis not present

## 2023-08-20 DIAGNOSIS — M6281 Muscle weakness (generalized): Secondary | ICD-10-CM | POA: Diagnosis not present

## 2023-08-22 NOTE — Telephone Encounter (Signed)
I would not be the one to ask about pulmonary issues on the CT scan, but from a cardiac standpoint these findings are pretty much the same.  The current reader was just a little bit more verbose than the previous reader.  We know that there is evidence of heart artery disease that is why we did the heart catheterizations.   Bryan Lemma, MD

## 2023-08-23 DIAGNOSIS — M6281 Muscle weakness (generalized): Secondary | ICD-10-CM | POA: Diagnosis not present

## 2023-08-23 DIAGNOSIS — M25512 Pain in left shoulder: Secondary | ICD-10-CM | POA: Diagnosis not present

## 2023-08-23 DIAGNOSIS — M25612 Stiffness of left shoulder, not elsewhere classified: Secondary | ICD-10-CM | POA: Diagnosis not present

## 2023-08-29 ENCOUNTER — Encounter (HOSPITAL_COMMUNITY): Payer: PPO

## 2023-08-29 ENCOUNTER — Encounter: Payer: PPO | Admitting: Vascular Surgery

## 2023-09-03 ENCOUNTER — Other Ambulatory Visit: Payer: Self-pay | Admitting: *Deleted

## 2023-09-03 DIAGNOSIS — I872 Venous insufficiency (chronic) (peripheral): Secondary | ICD-10-CM

## 2023-09-10 NOTE — Progress Notes (Signed)
[facility] [place]  ASSESSMENT / PLAN: Terry Wright is a 68 y.o. male with bilateral lower extremity swelling.  This is improved significantly with adjustment of amlodipine dosing.  He does have chronic venous insufficiency causing hemosiderin deposition.  Venous duplex shows no opportunities for endovenous ablation.  I counseled him that intervention would not likely improve his symptoms as minimal as they are now.  Continue compression and elevation.  Follow-up with me as needed.   CHIEF COMPLAINT: Leg swelling  HISTORY OF PRESENT ILLNESS: Terry Wright is a 68 y.o. male referred to clinic for evaluation of lower extremity swelling bilaterally.  Patient noted several weeks of severe lower extremity swelling which was quite bothersome to him.  He was seen by his medicine doctors, who recommended changing his amlodipine dosing.  After this he saw significant improvement in lower extremity swelling symptoms.  He does have some hemosiderin deposition and some prominent veins in the lower extremities.  He is referred for evaluation of possible chronic venous insufficiency.  Thankfully the patient symptoms are much improved and minimally bothersome now.  He is intermittently compliant with compression.  He knows how to elevate his legs.   Past Medical History:  Diagnosis Date   Anginal pain (HCC) 2018   Anxiety    Arthritis    shoulders hips,    CAD S/P percutaneous coronary angioplasty 07/2016   a) 07/2016: 100% pCx -- DES PCI with 4.0 x 16 mm Synergy DES. 65% ostial OM 3 (relatively small caliber - Med Rx);; b) 03/27/18 PCI/DES to dLCX/OM3 x1. Patent pCX DES. 70% dRCA trifurcation lesion (Med Rx). Normal EF;; 07/2022: Patent Cx/LPAV Stents patent. ~mRCA 95%--> dRCA 100% (prior 90% bifurcation lesion). pLAD 55%, m-dLAD 45%. L-R Collaterals fill PAVPL & PDA.   COPD (chronic obstructive pulmonary disease) (HCC) 2018   mild   DVT (deep venous thrombosis)  (HCC)    Right upper arm DVT on 2 occasions in the remote past   Dysrhythmia    V tac   Ejection fraction    LV function normal, echo, February, 2010   GERD (gastroesophageal reflux disease)    History of tobacco abuse 2015   43-pack-year history   Hyperlipidemia    Hypertension    Mitral regurgitation    Mild, echo, February, 2010   Palpitations    Mild in the past   Peripheral vascular disease (HCC) 1980s   DVT   Pre-diabetes    Statin intolerance    Felt poorly after Lipitor, Crestor, TriCor   STEMI involving left circumflex coronary artery (HCC) 08/04/2016   Occluded very large caliber codominant circumflex - PCI with single Synergy DES    Past Surgical History:  Procedure Laterality Date   CARDIAC CATHETERIZATION N/A 08/04/2016   Procedure: Left Heart Cath and Coronary Angiography;  Surgeon: Marykay Lex, MD;  Location: [facility] INVASIVE CV LAB;  Service: Cardiovascular;  Laterality: N/A: 100% pCx --> PCI. residual 65% pOM3 (small).  ~EF 35-45%  with lateral HK.   CARDIAC CATHETERIZATION N/A 08/04/2016   Procedure: CORONARY STENT INTERVENTION;  Surgeon: Marykay Lex, MD;  Location: [facility] INVASIVE CV LAB;  Service: Cardiovascular;  Laterality: N/A: pCx 100%-0%: Synergy DES 4.0 x 16 (4.6 mm)   CHONDROPLASTY Right 04/04/2017   Procedure: CHONDROPLASTY;  Surgeon: Jodi Geralds, MD;  Location: [facility];  Service: Orthopedics;  Laterality: Right;   CORONARY STENT INTERVENTION N/A 03/27/2018   Procedure: CORONARY STENT INTERVENTION;  Surgeon: Swaziland, Peter M, MD;  Location: [facility] INVASIVE CV LAB;  Service: Cardiovascular:: dCx-OM3 95%-0%: DES PCI - Synergy 2.25 x 16 mm.   GANGLION CYST EXCISION     oct 2017   HERNIA REPAIR     umbilical hernia 2017 oct   KNEE ARTHROSCOPY Right 04/04/2017   Procedure: ARTHROSCOPY RIGHT KNEE, WITH MEIDAL FEMORAL CONDYLE PATELLAR MEDIAL FEMORAL JOINT PLICA EXCISION ;  Surgeon: Jodi Geralds, MD;  Location: [facility];  Service: Orthopedics;  Laterality: Right;    LEFT HEART CATH AND CORONARY ANGIOGRAPHY N/A 03/27/2018   Procedure: LEFT HEART CATH AND CORONARY ANGIOGRAPHY;  Surgeon: Swaziland, Peter M, MD;  Location: [facility] INVASIVE CV LAB;  Service: Cardiovascular:   2 vessel obstructive CAD: - 95% dLCx/OM3 --Successful PCI of the dLCx/OM3 with SYNERGY DES 2.25X16, -70% distal RCA at trifurcation (Med Management).  Widely patent pLCx DES. Low normal LV function (50-55%), Normal LVEDP    LEFT HEART CATH AND CORONARY ANGIOGRAPHY N/A 08/30/2022   Procedure: LEFT HEART CATH AND CORONARY ANGIOGRAPHY;  Surgeon: Marykay Lex, MD;  Location: [facility] INVASIVE CV LAB;  Service: CV. ::pLAD 55%, m-d LAD 45%. pRCA-dRCA 95% (SubTO) -> dRCA 100% CTO prior to Bifurcation w/ 90-95% into PDA & PAV - both fill via L-R collaterals. pCx DES & LPAV stent patent.  No AS. LVEDP 16 mmHg.   NM MYOVIEW LTD  08/23/2022   INTERMEDIATE RISK.  EF~39%.  Infero-septal wall hypokinesis.  No ST segment changes.  Partially veritable medium sized, moderate severity defect in the mid to basal inferoseptal wall.; Considered Infarct w/ Peri-Infarct Ischemia. => Referred for Cath.   REVERSE SHOULDER ARTHROPLASTY Left 06/14/2023   Procedure: REVERSE SHOULDER ARTHROPLASTY;  Surgeon: Francena Hanly, MD;  Location: [facility];  Service: Orthopedics;  Laterality: Left;    TOTAL HIP ARTHROPLASTY Left 04/08/2020   Procedure: TOTAL HIP ARTHROPLASTY ANTERIOR APPROACH;  Surgeon: Durene Romans, MD;  Location: [facility];  Service: Orthopedics;  Laterality: Left;  70 mins   TOTAL HIP ARTHROPLASTY Right 05/02/2022   Procedure: TOTAL HIP ARTHROPLASTY ANTERIOR APPROACH;  Surgeon: Durene Romans, MD;  Location: [facility];  Service: Orthopedics;  Laterality: Right;  90   TRANSTHORACIC ECHOCARDIOGRAM  08/22/2022   Normal LV Size &Fxn - EF ~ 55-65%. No RWMA. Mild Conc LVH with Severe LA dilation (but ? Normal Diastolic Fxn). Normal RV size & Fxn with normal RVP & RAP. Normal MV & AoV.   TRANSTHORACIC ECHOCARDIOGRAM  01/11/2017   a)  a) 07/2016: post STEMI: EF 40-45%. Mild concentric LVH. Severe HK of basal-mid inferolateral wall consistent with infarct in this distribution. GR 1 DD.;; b) 12/2016: EF 50-55%. GR 1 DD. Mild to moderate LA dilation.    Family History  Problem Relation Age of Onset   Heart attack Mother        x2- lived into 80s   Hypertension Father        lived into 39s   Other Brother        killed in [place]    Social History   Socioeconomic History   Marital status: Married    Spouse name: Larita Fife   Number of children: Not on file   Years of education: 12   Highest education level: High school graduate  Occupational History   Not on file  Tobacco Use   Smoking status: Former    Current packs/day: 0.00    Average packs/day: 1 pack/day for 43.0 years (43.0 ttl pk-yrs)    Types: Cigarettes    Start date: 33  Quit date: 2015    Years since quitting: 10.1   Smokeless tobacco: Never   Tobacco comments:    Former smoker quit 2015  Vaping Use   Vaping status: Never Used  Substance and Sexual Activity   Alcohol use: Yes    Alcohol/week: 7.0 standard drinks of alcohol    Types: 7 Glasses of wine per week    Comment: occasional   Drug use: No   Sexual activity: Not on file  Other Topics Concern   Not on file  Social History Narrative   Married 10 years in 2018. 2 step kids- 26 and 28. 3 kids but he does not have contact with. No grandkids      Retired Comptroller.    Wife working [place]- vein and vascular      Hobbies: golf (needs shoulder replacement though, working in the yard, working out- was doing this regularly even before MI- walking/treadmill   Social Drivers of Corporate investment banker Strain: Low Risk  (03/01/2023)   Overall Financial Resource Strain (CARDIA)    Difficulty of Paying Living Expenses: Not very hard  Food Insecurity: No Food Insecurity (03/01/2023)   Hunger Vital Sign    Worried About Running Out of Food in the Last Year: Never true    Ran Out of  Food in the Last Year: Never true  Transportation Needs: No Transportation Needs (03/01/2023)   PRAPARE - Administrator, Civil Service (Medical): No    Lack of Transportation (Non-Medical): No  Physical Activity: Sufficiently Active (03/01/2023)   Exercise Vital Sign    Days of Exercise per Week: 6 days    Minutes of Exercise per Session: 50 min  Stress: Stress Concern Present (03/01/2023)   [employer] of Occupational Health - Occupational Stress Questionnaire    Feeling of Stress : To some extent  Social Connections: Moderately Integrated (03/01/2023)   Social Connection and Isolation Panel [NHANES]    Frequency of Communication with Friends and Family: More than three times a week    Frequency of Social Gatherings with Friends and Family: More than three times a week    Attends Religious Services: Never    Database administrator or Organizations: Yes    Attends Engineer, structural: More than 4 times per year    Marital Status: Married  Catering manager Violence: Not At Risk (03/08/2023)   Humiliation, Afraid, Rape, and Kick questionnaire    Fear of Current or Ex-Partner: No    Emotionally Abused: No    Physically Abused: No    Sexually Abused: No    Allergies  Allergen Reactions   Metformin     After starting metformin- Severe hip pain bilaterally worse on left. Could not turn neck. Back pain. Symptoms drastically improved off metformin    Current Outpatient Medications  Medication Sig Dispense Refill   acetaminophen (TYLENOL) 650 MG CR tablet Take 1,300 mg by mouth every 8 (eight) hours as needed for pain.     amLODipine (NORVASC) 2.5 MG tablet Take 1 tablet (2.5 mg total) by mouth daily. 90 tablet 3   carvedilol (COREG) 12.5 MG tablet TAKE 1 TABLET BY MOUTH 2 TIMES DAILY 180 tablet 1   clopidogrel (PLAVIX) 75 MG tablet TAKE 1 TABLET BY MOUTH EVERY DAY 90 tablet 3   cyclobenzaprine (FLEXERIL) 10 MG tablet Take 1 tablet (10 mg total) by mouth 3 (three)  times daily as needed for muscle spasms. 30 tablet 1  LORazepam (ATIVAN) 0.5 MG tablet TAKE 1 TABLET BY MOUTH AT BEDTIME AS NEEDED FOR ANXIETY. DO not drive FOR 8 hours AFTER taking 90 tablet 1   nitroGLYCERIN (NITROSTAT) 0.4 MG SL tablet place 1 TABLET UNDER THE TONGUE EVERY 5 MINUTES AS NEEDED FOR CHEST PAIN UP TO 3 doses 25 tablet 0   ondansetron (ZOFRAN) 4 MG tablet Take 1 tablet (4 mg total) by mouth every 8 (eight) hours as needed for nausea or vomiting. 10 tablet 0   oxyCODONE-acetaminophen (PERCOCET) 5-325 MG tablet Take 1 tablet by mouth every 4 (four) hours as needed (max 6 q). 20 tablet 0   pantoprazole (PROTONIX) 40 MG tablet TAKE 1 TABLET BY MOUTH EVERY DAY 90 tablet 3   ranolazine (RANEXA) 500 MG 12 hr tablet Take 1 tablet (500 mg total) by mouth 2 (two) times daily. 180 tablet 3   rosuvastatin (CRESTOR) 40 MG tablet TAKE 1 TABLET BY MOUTH EVERY DAY (Patient taking differently: Take 40 mg by mouth every evening.) 90 tablet 3   triamcinolone lotion (KENALOG) 0.1 % Apply 1 application  topically 2 (two) times daily as needed (skin irritation). Mix lotion with cream and apply twice daily to affected area     No current facility-administered medications for this visit.    PHYSICAL EXAM Vitals:   09/11/23 1256  BP: 124/72  Pulse: 65  Resp: 20  Temp: 98.4 F (36.9 C)  SpO2: 96%  Weight: 207 lb (93.9 kg)  Height: 6\' 1"  (1.854 m)    Well-appearing elderly man in no distress Regular rate and rhythm Unlabored breathing Palpable dorsalis pedis pulses bilaterally Mild hemosiderin staining in the pretibial skin bilaterally Minimal swelling  PERTINENT LABORATORY AND RADIOLOGIC DATA  Most recent CBC    Latest Ref Rng & Units 06/05/2023    1:25 PM 04/20/2023    1:32 PM 11/17/2022    9:48 AM  CBC  WBC 4.0 - 10.5 K/uL 6.4  6.6  6.1   Hemoglobin 13.0 - 17.0 g/dL 46.9  62.9  52.8   Hematocrit 39.0 - 52.0 % 40.4  41.1  41.5   Platelets 150 - 400 K/uL 212  223.0  224.0      Most  recent CMP    Latest Ref Rng & Units 06/05/2023    1:25 PM 04/20/2023    1:32 PM 11/17/2022    9:48 AM  CMP  Glucose 70 - 99 mg/dL 413  97  244   BUN 8 - 23 mg/dL 18  23  15    Creatinine 0.61 - 1.24 mg/dL 0.10  2.72  5.36   Sodium 135 - 145 mmol/L 138  140  141   Potassium 3.5 - 5.1 mmol/L 4.1  4.0  4.7   Chloride 98 - 111 mmol/L 106  105  104   CO2 22 - 32 mmol/L 24  27  28    Calcium 8.9 - 10.3 mg/dL 9.1  9.5  9.5   Total Protein 6.0 - 8.3 g/dL  7.1  7.1   Total Bilirubin 0.2 - 1.2 mg/dL  0.6  0.6   Alkaline Phos 39 - 117 U/L  50  56   AST 0 - 37 U/L  15  18   ALT 0 - 53 U/L  14  16     Renal function CrCl cannot be calculated (Patient's most recent lab result is older than the maximum 21 days allowed.).  Hgb A1c MFr Bld (%)  Date Value  04/20/2023 6.5  LDL Cholesterol (Calc)  Date Value Ref Range Status  03/15/2020 67 mg/dL (calc) Final    Comment:    Reference range: <100 . Desirable range <100 mg/dL for primary prevention;   <70 mg/dL for patients with CHD or diabetic patients  with > or = 2 CHD risk factors. Marland Kitchen LDL-C is now calculated using the Martin-Hopkins  calculation, which is a validated novel method providing  better accuracy than the Friedewald equation in the  estimation of LDL-C.  Horald Pollen et al. Lenox Ahr. 3664;403(47): 2061-2068  (http://education.[facility].com/faq/FAQ164)    LDL Cholesterol  Date Value Ref Range Status  11/17/2022 57 0 - 99 mg/dL Final   Direct LDL  Date Value Ref Range Status  04/20/2023 62.0 mg/dL Final    Comment:    Optimal:  <100 mg/dLNear or Above Optimal:  100-129 mg/dLBorderline High:  130-159 mg/dLHigh:  160-189 mg/dLVery High:  >190 mg/dL    Right lower extremity venous duplex:  - No evidence of deep vein thrombosis seen in the right lower extremity,  from the common femoral through the popliteal veins.  - No evidence of superficial venous thrombosis in the right lower  extremity.    - The deep venous system  is incompetent at the common femoral vein.  - The great saphenous vein is incompetent at the mid thigh.  - The small saphenosu vein is grossly incompetent.   Rande Brunt. Lenell Antu, MD FACS [facility] Phone Number: [phone] 09/11/2023 2:21 PM   Total time spent on preparing this encounter including chart review, data review, collecting history, examining the patient, coordinating care for this new patient, 45 minutes.  Portions of this report may have been transcribed using voice recognition software.  Every effort has been made to ensure accuracy; however, inadvertent computerized transcription errors may still be present.

## 2023-09-11 ENCOUNTER — Ambulatory Visit (HOSPITAL_COMMUNITY)
Admission: RE | Admit: 2023-09-11 | Discharge: 2023-09-11 | Disposition: A | Discharge status: Home / Self Care | Payer: PPO | Source: Ambulatory Visit | Attending: Vascular Surgery | Admitting: Vascular Surgery

## 2023-09-11 ENCOUNTER — Encounter: Payer: Self-pay | Admitting: Vascular Surgery

## 2023-09-11 ENCOUNTER — Ambulatory Visit: Payer: PPO | Admitting: Vascular Surgery

## 2023-09-11 VITALS — BP 124/72 | HR 65 | Temp 98.4°F | Resp 20 | Ht 73.0 in | Wt 207.0 lb

## 2023-09-11 DIAGNOSIS — I872 Venous insufficiency (chronic) (peripheral): Secondary | ICD-10-CM | POA: Diagnosis not present

## 2023-09-12 DIAGNOSIS — Z471 Aftercare following joint replacement surgery: Secondary | ICD-10-CM | POA: Diagnosis not present

## 2023-09-12 DIAGNOSIS — Z96612 Presence of left artificial shoulder joint: Secondary | ICD-10-CM | POA: Diagnosis not present

## 2023-09-21 ENCOUNTER — Other Ambulatory Visit: Payer: Self-pay

## 2023-09-24 MED ORDER — LORAZEPAM 0.5 MG PO TABS: ORAL_TABLET | ORAL | 1 refills | Status: DC

## 2023-10-01 ENCOUNTER — Encounter: Payer: Self-pay | Admitting: Family Medicine

## 2023-10-02 ENCOUNTER — Ambulatory Visit (INDEPENDENT_AMBULATORY_CARE_PROVIDER_SITE_OTHER): Admitting: Family Medicine

## 2023-10-02 ENCOUNTER — Encounter: Payer: Self-pay | Admitting: Family Medicine

## 2023-10-02 VITALS — BP 112/62 | HR 66 | Temp 97.4°F | Ht 73.0 in | Wt 211.8 lb

## 2023-10-02 DIAGNOSIS — E785 Hyperlipidemia, unspecified: Secondary | ICD-10-CM | POA: Diagnosis not present

## 2023-10-02 DIAGNOSIS — H8110 Benign paroxysmal vertigo, unspecified ear: Secondary | ICD-10-CM

## 2023-10-02 DIAGNOSIS — I25119 Atherosclerotic heart disease of native coronary artery with unspecified angina pectoris: Secondary | ICD-10-CM | POA: Diagnosis not present

## 2023-10-02 DIAGNOSIS — I10 Essential (primary) hypertension: Secondary | ICD-10-CM

## 2023-10-02 DIAGNOSIS — E1159 Type 2 diabetes mellitus with other circulatory complications: Secondary | ICD-10-CM | POA: Diagnosis not present

## 2023-10-02 MED ORDER — CLOPIDOGREL BISULFATE 75 MG PO TABS: 75.0000 mg | ORAL_TABLET | Freq: Every day | ORAL | Status: DC

## 2023-10-02 NOTE — Progress Notes (Signed)
 Phone [phone] In person visit   Subjective:   Terry Wright is a 68 y.o. year old very pleasant male patient who presents for/with See problem oriented charting Chief Complaint  Patient presents with   Dizziness    Pt c/o continued dizziness after being in the [place] that he experiences when sitting up. Denies chest pain, palpitations, headaches and blurred vision. Has cardiologist. Today he feels fine.    Past Medical History-  Patient Active Problem List   Diagnosis Date Noted   Coronary artery disease involving native coronary artery of native heart with angina pectoris (HCC) 09/21/2016    Priority: High   Insomnia 09/21/2016    Priority: High   Diabetes mellitus with cardiac complication (HCC) 09/21/2016    Priority: High   STEMI involving left circumflex coronary artery (HCC) -complicated by cardiac arrest 08/04/2016    Priority: High   History of DVT (deep vein thrombosis)     Priority: High   Former smoker     Priority: High   Centrilobular emphysema (HCC) 09/04/2017    Priority: Medium    Ischemic cardiomyopathy 10/12/2016    Priority: Medium    Dyslipidemia, goal LDL <55     Priority: Medium    Essential hypertension     Priority: Medium    Unstable angina (HCC)     Priority: Low   Thoracic aortic atherosclerosis (HCC) 09/04/2017    Priority: Low   Acute medial meniscus tear of right knee 04/04/2017    Priority: Low   GERD (gastroesophageal reflux disease)     Priority: Low   Ventricular bigeminy     Priority: Low   Venous stasis dermatitis of both lower extremities 07/12/2023   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     amLODipine (NORVASC) 2.5 MG tablet Take 1 tablet (2.5 mg total) by mouth daily. 90 tablet 3   carvedilol (COREG) 12.5 MG tablet TAKE 1 TABLET BY MOUTH 2 TIMES DAILY 180 tablet 1   nitroGLYCERIN (NITROSTAT) 0.4 MG SL tablet place 1 TABLET UNDER THE TONGUE EVERY 5 MINUTES AS NEEDED FOR CHEST PAIN UP TO 3 doses 25 tablet 0   pantoprazole (PROTONIX) 40 MG tablet TAKE 1 TABLET BY MOUTH EVERY DAY 90 tablet 3   ranolazine (RANEXA) 500 MG 12 hr tablet Take 1 tablet (500 mg total) by mouth 2 (two) times daily. 180 tablet 3   rosuvastatin (CRESTOR) 40 MG tablet TAKE 1 TABLET BY MOUTH EVERY DAY (Patient taking differently: Take 40 mg by mouth every evening.) 90 tablet 3   triamcinolone lotion (KENALOG) 0.1 % Apply 1 application  topically 2 (two) times daily as needed (skin irritation). Mix lotion with cream and apply twice daily to affected area     clopidogrel (PLAVIX) 75 MG tablet Take 1 tablet (75 mg total) by mouth daily. TAKE 1 TABLET BY MOUTH EVERY DAY. Per cardiology     cyclobenzaprine (FLEXERIL) 10 MG tablet Take 1 tablet (10 mg total) by mouth 3 (three) times daily as needed for muscle spasms. (Patient not taking: Reported on 10/02/2023)  30 tablet 1   LORazepam (ATIVAN) 0.5 MG tablet TAKE 1 TABLET BY MOUTH AT BEDTIME AS NEEDED FOR ANXIETY. DO not drive FOR 8 hours AFTER taking (Patient not taking: Reported on 10/02/2023) 90 tablet 1   No current facility-administered medications for this visit.     Objective:  BP 112/62   Pulse 66   Temp (!) 97.4 F (36.3 C)   Ht 6\' 1"  (1.854 m)   Wt 211 lb 12.8 oz (96.1 kg)   SpO2 98%   BMI 27.94 kg/m  Gen: NAD, resting comfortably Tympanic membrane normal bilaterally CV: RRR no murmurs rubs or gallops Lungs: CTAB no crackles, wheeze, rhonchi Ext: no edema Skin: warm, dry  Neuro: CN II-XII intact, sensation and reflexes normal throughout, 5/5  muscle strength in bilateral upper and lower extremities. Normal finger to nose. Normal rapid alternating movements. No pronator drift. Normal romberg. Normal gait.  Dix-Hallpike maneuver negative     Assessment and Plan    # vertigo S:patient reported vertigo after getting back from the [place]- flew down February 21st and returned following Monday. One to two days upon returning noted when leaning forward he would feel dizzy vs vertigo- felt like he needed to grab on and felt unstable. Also happened with rolling over in bed slightly.   Other position changes- sitting up, standing up, walking at the gym with no issues. No chest pain, shortness of breath, palpation, headache(s), blurry vision. No ne tinnitus or hearing loss. No facial or extremity weakness. No slurred words or trouble swallowing. no double vision. No paresthesias. No confusion or word finding difficulties.  - he looked up that 2 of his medications could cause dizziness but never caused before and has been on for a year including raexa  Today is first day he has felt better.   A/P: Patient developed after returning from recent trip to the [place].  Symptoms have resolved today but were positional with leaning head forward or turning over in bed and short-term with no other red flags and reassuring neurological exam concerning for benign paroxysmal positional vertigo.  Symptoms resolved this morning-wonder if repositioning of the semicircular canal canalith's.  Dix-Hallpike was negative today but patient has been asymptomatic.  He agrees to return to care with new or worsening symptoms.  No intervention at this time -No signs of orthostatic hypotension such as with standing or sitting up in bed  # Elevated PSA-follows with urology Dr. Cardell Peach -and reports upcoming visit in next few months-once hold off on PSA here  #CAD/hyperlipidemia/aortic atherosclerosis -follows with [name] of [facility] cardiology S: Patient with history of stents  March 27, 2018 and August 04, 2016 with stent considered in 2024 but Ranexa improved symptoms.  Compliant with Plavix 75 mg and ranexa 500 mg q12 hours.  Also on rosuvastatin 40 mg Lab Results  Component Value Date   CHOL 115 11/17/2022   HDL 41.20 11/17/2022   LDLCALC 57 11/17/2022   LDLDIRECT 62.0 04/20/2023   TRIG 87.0 11/17/2022   CHOLHDL 3 11/17/2022   A/P: CAD asymptomatic-continue current medications.  Lipids at goal-continue current medications and will update lipid panel with upcoming labs  #hypertension S: compliant with carvedilol 12.5 mg twice daily, amlodipine 2.5 mg (down from 10 mg and less swelling)   BP Readings from Last 3 Encounters:  10/02/23 112/62  09/11/23 124/72  07/12/23 122/64   A/P: Blood pressure remains well-controlled-continue medication  #Anxiety/panic attacks-Ativan primarily for sleep at night-can refill if needed  # Diabetes  new 04/19/2021 S: Medication: diet controlled   Lab Results  Component Value Date   HGBA1C 6.5 04/20/2023   HGBA1C 6.6 (H) 11/17/2022   HGBA1C 6.3 (H) 04/20/2022  A/P: Hopefuly stable or improved- update A1c with labs. Continue current meds for now.  Recommended follow up: Return in about 6 months (around 04/03/2024) for physical or sooner if needed.Schedule b4 you leave. Future Appointments  Date Time Provider Department Center  10/09/2023  8:30 AM [facility]  01/09/2024  2:00 PM Marykay Lex, MD [facility] None    Lab/Order associations:   ICD-10-CM   1. Diabetes mellitus with cardiac complication (HCC)  E11.59 Comprehensive metabolic panel    CBC with Differential/Platelet    Lipid panel    Hemoglobin A1c    2. Benign paroxysmal positional vertigo, unspecified laterality  H81.10     3. Coronary artery disease involving native coronary artery of native heart with angina pectoris (HCC)  I25.119     4. Essential hypertension  I10     5. Dyslipidemia, goal LDL <55  E78.5       Meds ordered  this encounter  Medications   clopidogrel (PLAVIX) 75 MG tablet    Sig: Take 1 tablet (75 mg total) by mouth daily. TAKE 1 TABLET BY MOUTH EVERY DAY. Per cardiology    Return precautions advised.  Tana Conch, MD

## 2023-10-02 NOTE — Patient Instructions (Addendum)
 Schedule a lab visit at the check out desk within 2 weeks. Return for future fasting labs meaning nothing but water after midnight please. Ok to take your medications with water.   Sounds like benign paroxysmal positional vertigo (BPPV) - if this recurs or you have new or worsening sympoms let me know immediately   Recommended follow up: Return in about 6 months (around 04/03/2024) for physical or sooner if needed.Schedule b4 you leave.

## 2023-10-06 ENCOUNTER — Emergency Department (HOSPITAL_COMMUNITY)
Admission: EM | Admit: 2023-10-06 | Discharge: 2023-10-06 | Disposition: A | Discharge status: Home / Self Care | Attending: Emergency Medicine | Admitting: Emergency Medicine

## 2023-10-06 ENCOUNTER — Emergency Department (HOSPITAL_COMMUNITY)

## 2023-10-06 ENCOUNTER — Other Ambulatory Visit: Payer: Self-pay

## 2023-10-06 ENCOUNTER — Encounter (HOSPITAL_COMMUNITY): Payer: Self-pay | Admitting: Emergency Medicine

## 2023-10-06 DIAGNOSIS — I959 Hypotension, unspecified: Secondary | ICD-10-CM | POA: Diagnosis not present

## 2023-10-06 DIAGNOSIS — R079 Chest pain, unspecified: Secondary | ICD-10-CM | POA: Insufficient documentation

## 2023-10-06 DIAGNOSIS — Z7901 Long term (current) use of anticoagulants: Secondary | ICD-10-CM | POA: Insufficient documentation

## 2023-10-06 DIAGNOSIS — Z471 Aftercare following joint replacement surgery: Secondary | ICD-10-CM | POA: Diagnosis not present

## 2023-10-06 DIAGNOSIS — R0789 Other chest pain: Secondary | ICD-10-CM | POA: Diagnosis not present

## 2023-10-06 DIAGNOSIS — Z96612 Presence of left artificial shoulder joint: Secondary | ICD-10-CM | POA: Diagnosis not present

## 2023-10-06 LAB — CBC
HCT: 39 % (ref 39.0–52.0)
Hemoglobin: 12.8 g/dL — ABNORMAL LOW (ref 13.0–17.0)
MCH: 27.5 pg (ref 26.0–34.0)
MCHC: 32.8 g/dL (ref 30.0–36.0)
MCV: 83.9 fL (ref 80.0–100.0)
Platelets: 184 10*3/uL (ref 150–400)
RBC: 4.65 MIL/uL (ref 4.22–5.81)
RDW: 14.9 % (ref 11.5–15.5)
WBC: 7.5 10*3/uL (ref 4.0–10.5)
nRBC: 0 % (ref 0.0–0.2)

## 2023-10-06 LAB — LIPASE, BLOOD: Lipase: 34 U/L (ref 11–51)

## 2023-10-06 LAB — COMPREHENSIVE METABOLIC PANEL
ALT: 16 U/L (ref 0–44)
AST: 18 U/L (ref 15–41)
Albumin: 3.6 g/dL (ref 3.5–5.0)
Alkaline Phosphatase: 47 U/L (ref 38–126)
Anion gap: 10 (ref 5–15)
BUN: 16 mg/dL (ref 8–23)
CO2: 21 mmol/L — ABNORMAL LOW (ref 22–32)
Calcium: 8.7 mg/dL — ABNORMAL LOW (ref 8.9–10.3)
Chloride: 104 mmol/L (ref 98–111)
Creatinine, Ser: 1.01 mg/dL (ref 0.61–1.24)
GFR, Estimated: >60 mL/min (ref >60)
Glucose, Bld: 160 mg/dL — ABNORMAL HIGH (ref 70–99)
Potassium: 3.7 mmol/L (ref 3.5–5.1)
Sodium: 135 mmol/L (ref 135–145)
Total Bilirubin: 0.8 mg/dL (ref 0.0–1.2)
Total Protein: 6.6 g/dL (ref 6.5–8.1)

## 2023-10-06 LAB — TROPONIN I (HIGH SENSITIVITY)
Troponin I (High Sensitivity): 8 ng/L (ref <18)
Troponin I (High Sensitivity): 8 ng/L (ref <18)

## 2023-10-06 NOTE — ED Provider Notes (Signed)
 [place] EMERGENCY DEPARTMENT AT [facility] Provider Note   CSN: [id number] Arrival date & time: 10/06/23  1207     History  Chief Complaint  Patient presents with   Chest Pain    Terry Wright is a 68 y.o. male who presents emergency department with chief complaint of chest pain.  Patient had onset of chest discomfort which he describes as sharp, coming and going, in the inferior retrosternal region starting this morning.  Patient states that he also had some diarrhea for which she took Imodium.  And he was feeling "under the weather."  Last night.  Patient was not active at the time of the onset of his chest pain he was sitting watching television.  He states he got up and walked around and it would improve and was worse when he would sit back down.  He took a few Tums without relief of symptoms he took a nitroglycerin and symptoms improved.  He states his chest discomfort came back and he took another nitroglycerin and his chest discomfort again room improved.  He states that previously his chest pain was a 7 out of 10, now 3 out of 10. Hx of Cardiac arrest.  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 Authorizing Provider  acetaminophen (TYLENOL) 650 MG CR tablet Take 1,300 mg by mouth every 8 (eight) hours as needed for pain.    [provider]  amLODipine (NORVASC) 2.5 MG tablet Take 1 tablet (2.5 mg total) by mouth daily. 07/12/23 10/10/23  Marykay Lex, MD  carvedilol (COREG) 12.5 MG tablet TAKE 1 TABLET BY MOUTH 2 TIMES DAILY 04/20/23   Marykay Lex, MD  clopidogrel (PLAVIX) 75 MG tablet Take 1 tablet (75 mg total) by mouth daily. TAKE 1 TABLET BY MOUTH EVERY DAY. Per cardiology 10/02/23   Shelva Majestic, MD  cyclobenzaprine (FLEXERIL) 10 MG tablet Take  1 tablet (10 mg total) by mouth 3 (three) times daily as needed for muscle spasms. Patient not taking: Reported on 10/02/2023 06/14/23   Shuford, French Ana, PA-C  LORazepam (ATIVAN) 0.5 MG tablet TAKE 1 TABLET BY MOUTH AT BEDTIME AS NEEDED FOR ANXIETY. DO not drive FOR 8 hours AFTER taking Patient not taking: Reported on 10/02/2023 09/24/23   Shelva Majestic, MD  nitroGLYCERIN (NITROSTAT) 0.4 MG SL tablet place 1 TABLET UNDER THE TONGUE EVERY 5 MINUTES AS NEEDED FOR CHEST PAIN UP TO 3 doses 07/02/23   Shelva Majestic, MD  pantoprazole (PROTONIX) 40 MG tablet TAKE 1 TABLET BY MOUTH EVERY DAY 08/09/23   Marykay Lex, MD  ranolazine (RANEXA) 500 MG 12 hr tablet Take 1 tablet (500 mg total) by mouth 2 (two) times daily. 08/14/23   Marykay Lex, MD  rosuvastatin (CRESTOR) 40 MG tablet TAKE 1 TABLET BY MOUTH EVERY DAY Patient taking differently: Take 40 mg by mouth every evening. 04/20/23   Shelva Majestic, MD  triamcinolone lotion (KENALOG) 0.1 % Apply 1 application  topically 2 (two) times daily as needed (skin irritation). Mix lotion with cream and apply twice daily to affected area    [provider]      Allergies    Metformin    Review of Systems   Review of Systems  Cardiovascular:  Positive for  chest pain.    Physical Exam Updated Vital Signs BP 116/69   Pulse 66   Temp 98.8 F (37.1 C) (Oral)   Resp (!) 23   Ht 6\' 1"  (1.854 m)   Wt 96 kg   SpO2 98%   BMI 27.92 kg/m  Physical Exam Vitals and nursing note reviewed.  Constitutional:      General: He is not in acute distress.    Appearance: He is well-developed. He is not diaphoretic.  HENT:     Head: Normocephalic and atraumatic.  Eyes:     General: No scleral icterus.    Conjunctiva/sclera: Conjunctivae normal.  Cardiovascular:     Rate and Rhythm: Normal rate and regular rhythm.     Heart sounds: Normal heart sounds.  Pulmonary:     Effort: Pulmonary effort is normal. No respiratory distress.     Breath  sounds: Normal breath sounds.  Abdominal:     Palpations: Abdomen is soft.     Tenderness: There is no abdominal tenderness.  Musculoskeletal:     Cervical back: Normal range of motion and neck supple.  Skin:    General: Skin is warm and dry.  Neurological:     Mental Status: He is alert.  Psychiatric:        Behavior: Behavior normal.     ED Results / Procedures / Treatments   Labs (all labs ordered are listed, but only abnormal results are displayed) Labs Reviewed  CBC - Abnormal; Notable for the following components:      Result Value   Hemoglobin 12.8 (*)    All other components within normal limits  COMPREHENSIVE METABOLIC PANEL - Abnormal; Notable for the following components:   CO2 21 (*)    Glucose, Bld 160 (*)    Calcium 8.7 (*)    All other components within normal limits  LIPASE, BLOOD  TROPONIN I (HIGH SENSITIVITY)  TROPONIN I (HIGH SENSITIVITY)    EKG EKG Interpretation Date/Time:  Saturday October 06 2023 12:15:15 EST Ventricular Rate:  80 PR Interval:  238 QRS Duration:  101 QT Interval:  407 QTC Calculation: 455 R Axis:   61  Text Interpretation: Sinus rhythm Ventricular premature complex Prolonged PR interval Abnormal R-wave progression, early transition Nonspecific T abnormalities, lateral leads PVCs are new Confirmed by Jacalyn Lefevre [phone]) on 10/06/2023 12:36:38 PM  Radiology DG Chest Port 1 View Result Date: 10/06/2023 CLINICAL DATA:  Chest pain. EXAM: PORTABLE CHEST 1 VIEW COMPARISON:  August 13, 2022. FINDINGS: The heart size and mediastinal contours are within normal limits. Both lungs are clear. Status post left shoulder arthroplasty. IMPRESSION: No active disease. Electronically Signed   By: Lupita Raider M.D.   On: 10/06/2023 13:14    Procedures Procedures    Medications Ordered in ED Medications - No data to display  ED Course/ Medical Decision Making/ A&P Clinical Course as of 10/06/23 1530  Sat Oct 06, 2023  1335 Troponin I  (High Sensitivity) [AH]    Clinical Course User Index [AH] Arthor Captain, PA-C             HEART Score: 5                    Medical Decision Making Amount and/or Complexity of Data Reviewed Labs: ordered. Decision-making details documented in ED Course.    Details: Labs show mildly elevated glucose of insignificant value, troponin negative x 2, hemoglobin 12.8 also insignificant Radiology: ordered and independent interpretation performed.  Details: I visualized and interpreted 1 view chest x-ray which shows no acute findings ECG/medicine tests: independent interpretation performed.    Details: EKG shows sinus rhythm with prolonged PR at a rate of 80   Given the large differential diagnosis for Terry Wright, the decision making in this case is of high complexity.  After evaluating all of the data points in this case, the presentation of Terry Wright is NOT consistent with Acute Coronary Syndrome (ACS) and/or myocardial ischemia, pulmonary embolism, aortic dissection; Borhaave's, significant arrythmia, pneumothorax, cardiac tamponade, or other emergent cardiopulmonary condition.  Further, the presentation of Terry Wright is NOT consistent with pericarditis, myocarditis, cholecystitis, pancreatitis, mediastinitis, endocarditis, new valvular disease.  Additionally, the presentation of Terry Wright NOT consistent with flail chest, cardiac contusion, ARDS, or significant intra-thoracic or intra-abdominal bleeding.  Moreover, this presentation is NOT consistent with pneumonia, sepsis, or pyelonephritis.     Strict return and follow-up precautions have been given by me personally or by detailed written instruction given verbally by nursing staff using the teach back method to the patient/family/caregiver(s).  Data Reviewed/Counseling: I have reviewed the patient's vital signs, nursing notes, and other relevant tests/information. I had a  detailed discussion regarding the historical points, exam findings, and any diagnostic results supporting the discharge diagnosis. I also discussed the need for outpatient follow-up and the need to return to the ED if symptoms worsen or if there are any questions or concerns that arise at home.         Final Clinical Impression(s) / ED Diagnoses Final diagnoses:  Nonspecific chest pain    Rx / DC Orders ED Discharge Orders     None         Arthor Captain, PA-C 10/06/23 1530    Jacalyn Lefevre, MD 10/06/23 [phone]

## 2023-10-06 NOTE — Discharge Instructions (Signed)

## 2023-10-06 NOTE — ED Triage Notes (Signed)
 Pt arrives via EMS from home with CP starting around 8 am today. Pt took tums with no relief, then took nitroglycerin (pain went from 7 to 2), 324 ASA.  Pt took some imodium for diarrhea today.

## 2023-10-08 ENCOUNTER — Telehealth: Payer: Self-pay

## 2023-10-08 ENCOUNTER — Other Ambulatory Visit: Payer: Self-pay | Admitting: Urology

## 2023-10-08 DIAGNOSIS — R972 Elevated prostate specific antigen [PSA]: Secondary | ICD-10-CM

## 2023-10-08 NOTE — Transitions of Care (Post Inpatient/ED Visit) (Signed)
   10/08/2023  Name: Terry Wright MRN: [id number] DOB: 1955/09/26  Today's TOC FU Call Status: Today's TOC FU Call Status:: Unsuccessful Call (1st Attempt) Unsuccessful Call (1st Attempt) Date: 10/08/23  Attempted to reach the patient regarding the most recent Inpatient/ED visit.  Follow Up Plan: Additional outreach attempts will be made to reach the patient to complete the Transitions of Care (Post Inpatient/ED visit) call.   Signature  Agnes Lawrence, CMA (AAMA)  [facility] [phone]

## 2023-10-09 ENCOUNTER — Other Ambulatory Visit (INDEPENDENT_AMBULATORY_CARE_PROVIDER_SITE_OTHER)

## 2023-10-09 ENCOUNTER — Encounter: Payer: Self-pay | Admitting: Family Medicine

## 2023-10-09 DIAGNOSIS — E1159 Type 2 diabetes mellitus with other circulatory complications: Secondary | ICD-10-CM

## 2023-10-09 LAB — CBC WITH DIFFERENTIAL/PLATELET
Basophils Absolute: 0 10*3/uL (ref 0.0–0.1)
Basophils Relative: 0.7 % (ref 0.0–3.0)
Eosinophils Absolute: 0.2 10*3/uL (ref 0.0–0.7)
Eosinophils Relative: 2.7 % (ref 0.0–5.0)
HCT: 39.2 % (ref 39.0–52.0)
Hemoglobin: 12.6 g/dL — ABNORMAL LOW (ref 13.0–17.0)
Lymphocytes Relative: 25.6 % (ref 12.0–46.0)
Lymphs Abs: 1.5 10*3/uL (ref 0.7–4.0)
MCHC: 32.1 g/dL (ref 30.0–36.0)
MCV: 84.3 fl (ref 78.0–100.0)
Monocytes Absolute: 0.7 10*3/uL (ref 0.1–1.0)
Monocytes Relative: 11.6 % (ref 3.0–12.0)
Neutro Abs: 3.4 10*3/uL (ref 1.4–7.7)
Neutrophils Relative %: 59.4 % (ref 43.0–77.0)
Platelets: 209 10*3/uL (ref 150.0–400.0)
RBC: 4.64 Mil/uL (ref 4.22–5.81)
RDW: 15.9 % — ABNORMAL HIGH (ref 11.5–15.5)
WBC: 5.8 10*3/uL (ref 4.0–10.5)

## 2023-10-09 LAB — COMPREHENSIVE METABOLIC PANEL
ALT: 15 U/L (ref 0–53)
AST: 18 U/L (ref 0–37)
Albumin: 4.2 g/dL (ref 3.5–5.2)
Alkaline Phosphatase: 52 U/L (ref 39–117)
BUN: 17 mg/dL (ref 6–23)
CO2: 29 meq/L (ref 19–32)
Calcium: 9 mg/dL (ref 8.4–10.5)
Chloride: 103 meq/L (ref 96–112)
Creatinine, Ser: 0.93 mg/dL (ref 0.40–1.50)
GFR: 84.29 mL/min (ref >60.00)
Glucose, Bld: 124 mg/dL — ABNORMAL HIGH (ref 70–99)
Potassium: 4 meq/L (ref 3.5–5.1)
Sodium: 138 meq/L (ref 135–145)
Total Bilirubin: 0.5 mg/dL (ref 0.2–1.2)
Total Protein: 6.6 g/dL (ref 6.0–8.3)

## 2023-10-09 LAB — LIPID PANEL
Cholesterol: 100 mg/dL (ref 0–200)
HDL: 36.8 mg/dL — ABNORMAL LOW (ref >39.00)
LDL Cholesterol: 46 mg/dL (ref 0–99)
NonHDL: 62.94
Total CHOL/HDL Ratio: 3
Triglycerides: 86 mg/dL (ref 0.0–149.0)
VLDL: 17.2 mg/dL (ref 0.0–40.0)

## 2023-10-09 LAB — HEMOGLOBIN A1C: Hgb A1c MFr Bld: 6.7 % — ABNORMAL HIGH (ref 4.6–6.5)

## 2023-10-18 ENCOUNTER — Ambulatory Visit: Admitting: Family Medicine

## 2023-10-20 ENCOUNTER — Ambulatory Visit (HOSPITAL_COMMUNITY)

## 2023-10-20 ENCOUNTER — Encounter (HOSPITAL_COMMUNITY): Payer: Self-pay

## 2023-11-01 DIAGNOSIS — D0462 Carcinoma in situ of skin of left upper limb, including shoulder: Secondary | ICD-10-CM | POA: Diagnosis not present

## 2023-11-01 DIAGNOSIS — D1801 Hemangioma of skin and subcutaneous tissue: Secondary | ICD-10-CM | POA: Diagnosis not present

## 2023-11-01 DIAGNOSIS — L309 Dermatitis, unspecified: Secondary | ICD-10-CM | POA: Diagnosis not present

## 2023-11-01 DIAGNOSIS — L570 Actinic keratosis: Secondary | ICD-10-CM | POA: Diagnosis not present

## 2023-11-17 ENCOUNTER — Ambulatory Visit (HOSPITAL_COMMUNITY)
Admission: RE | Admit: 2023-11-17 | Discharge: 2023-11-17 | Disposition: A | Discharge status: Home / Self Care | Source: Ambulatory Visit | Attending: Urology | Admitting: Urology

## 2023-11-17 DIAGNOSIS — N4289 Other specified disorders of prostate: Secondary | ICD-10-CM | POA: Diagnosis not present

## 2023-11-17 DIAGNOSIS — R972 Elevated prostate specific antigen [PSA]: Secondary | ICD-10-CM | POA: Diagnosis not present

## 2023-11-17 DIAGNOSIS — N400 Enlarged prostate without lower urinary tract symptoms: Secondary | ICD-10-CM | POA: Diagnosis not present

## 2023-11-17 MED ORDER — GADOBUTROL 1 MMOL/ML IV SOLN
9.0000 mL | Freq: Once | INTRAVENOUS | Status: AC | PRN
  Administered 2023-11-17: 9 mL via INTRAVENOUS

## 2023-11-22 ENCOUNTER — Telehealth: Payer: Self-pay | Admitting: *Deleted

## 2023-11-22 NOTE — Telephone Encounter (Signed)
   Name: Terry Wright  DOB: November 16, 1955  MRN: [id number]  Primary Cardiologist: Randene Bustard, MD   Preoperative team, please contact this patient and set up a phone call appointment for further preoperative risk assessment. Please obtain consent and complete medication review. Thank you for your help.  I confirm that guidance regarding antiplatelet and oral anticoagulation therapy has been completed and, if necessary, noted below.  His Plavix  may be held for 5 days prior to his procedure.  Please resume as soon as hemostasis is achieved.  I also confirmed the patient resides in the state of [place] . As per [facility] Medical Board telemedicine laws, the patient must reside in the state in which the provider is licensed.   Carie Charity, NP 11/22/2023, 4:25 PM [facility]

## 2023-11-22 NOTE — Telephone Encounter (Signed)
 Lab Results  Component Value Date   HCVAB NEGATIVE 01/25/2017    Patient Consent for Virtual Visit       Terry Wright has provided verbal consent on 11/22/2023 for a virtual visit (video or telephone).   CONSENT FOR VIRTUAL VISIT FOR:  Terry Wright  By participating in this virtual visit I agree to the following:  I hereby voluntarily request, consent and authorize [facility] and its employed or contracted physicians, physician assistants, nurse practitioners or other licensed health care professionals (the Practitioner), to provide me with telemedicine health care services (the "Services") as deemed necessary by the treating Practitioner. I acknowledge and consent to receive the Services by the Practitioner via telemedicine. I understand that the telemedicine visit will involve communicating with the Practitioner through live audiovisual communication technology and the disclosure of certain medical information by electronic transmission. I acknowledge that I have been given the opportunity to request an in-person assessment or other available alternative prior to the telemedicine visit and am voluntarily participating in the telemedicine visit.  I understand that I have the right to withhold or withdraw my consent to the use of telemedicine in the course of my care at any time, without affecting my right to future care or treatment, and that the Practitioner or I may terminate the telemedicine visit at any time. I understand that I have the right to inspect all information obtained and/or recorded in the course of the telemedicine visit and may receive copies of available information for a reasonable fee.  I understand that some of the potential risks of receiving the Services via telemedicine include:  Delay or interruption in medical evaluation due to technological equipment failure or disruption; Information transmitted may not be sufficient (e.g. poor  resolution of images) to allow for appropriate medical decision making by the Practitioner; and/or  In rare instances, security protocols could fail, causing a breach of personal health information.  Furthermore, I acknowledge that it is my responsibility to provide information about my medical history, conditions and care that is complete and accurate to the best of my ability. I acknowledge that Practitioner's advice, recommendations, and/or decision may be based on factors not within their control, such as incomplete or inaccurate data provided by me or distortions of diagnostic images or specimens that may result from electronic transmissions. I understand that the practice of medicine is not an exact science and that Practitioner makes no warranties or guarantees regarding treatment outcomes. I acknowledge that a copy of this consent can be made available to me via my patient portal [facility] MyChart), or I can request a printed copy by calling the office of [facility].    I understand that my insurance will be billed for this visit.   I have read or had this consent read to me. I understand the contents of this consent, which adequately explains the benefits and risks of the Services being provided via telemedicine.  I have been provided ample opportunity to ask questions regarding this consent and the Services and have had my questions answered to my satisfaction. I give my informed consent for the services to be provided through the use of telemedicine in my medical care

## 2023-11-22 NOTE — Telephone Encounter (Signed)
   Pre-operative Risk Assessment    Patient Name: Terry Wright  DOB: 04-Mar-1956 MRN: [id number]   Date of last office visit: 07/12/23 Date of next office visit: 12/2023   Request for Surgical Clearance    Procedure:   TRUSP BIOPSY  Date of Surgery:  Clearance TBD                                Surgeon:   Surgeon's Group or Practice Name:  [facility] Phone number:  [phone] Fax number:  [phone]   Type of Clearance Requested:   - Medical  - Pharmacy:  Hold Clopidogrel  (Plavix ) X'S 5 DAYS PRIOR   Type of Anesthesia:  Not Indicated   Additional requests/questions:    Berenda Breaker   11/22/2023, 4:09 PM

## 2023-11-23 NOTE — Telephone Encounter (Signed)
Left message for the patient to contact the office. 

## 2023-11-23 NOTE — Telephone Encounter (Signed)
 Patient was returning call. Please advise ?

## 2023-11-23 NOTE — Telephone Encounter (Signed)
 Patient has telehealth appt scheduled for next week. Patient agreeable and voiced understanding.

## 2023-11-27 ENCOUNTER — Ambulatory Visit: Attending: Cardiovascular Disease

## 2023-11-27 DIAGNOSIS — Z01810 Encounter for preprocedural cardiovascular examination: Secondary | ICD-10-CM

## 2023-11-27 NOTE — Progress Notes (Signed)
 Virtual Visit via Telephone Note   Because of Terry Wright co-morbid illnesses, he is at least at moderate risk for complications without adequate follow up.  This format is felt to be most appropriate for this patient at this time.  Due to technical limitations with video connection Web designer), today's appointment will be conducted as an audio only telehealth visit, and Terry Wright verbally agreed to proceed in this manner.   All issues noted in this document were discussed and addressed.  No physical exam could be performed with this format.  Evaluation Performed:  Preoperative cardiovascular risk assessment _____________   Date:  11/27/2023   Patient ID:  Terry Wright, DOB 04/22/1956, MRN [id number] Patient Location:  Home Provider location:   Office  Primary Care Provider:  Almira Jaeger, MD Primary Cardiologist:  Randene Bustard, MD  Chief Complaint / Patient Profile   68 y.o. y/o male with a h/o hypertension, coronary artery disease, ischemic cardiomyopathy who is pending TRUSP BIOPSY  and presents today for telephonic preoperative cardiovascular risk assessment.  History of Present Illness    Terry Wright is a 68 y.o. male who presents via audio/video conferencing for a telehealth visit today.  Pt was last seen in cardiology clinic on 07/12/2023 by Dr. Addie Holstein.  At that time Elburn Clemmons was doing well .  The patient is now pending procedure as outlined above. Since his last visit, he continues to be stable from a cardiac standpoint.  Today he denies chest pain, shortness of breath, lower extremity edema, fatigue, palpitations, melena, hematuria, hemoptysis, diaphoresis, weakness, presyncope, syncope, orthopnea, and PND.   Past Medical History    Past Medical History:  Diagnosis Date   Anginal pain (HCC) 2018   Anxiety    Arthritis    shoulders hips,    CAD S/P percutaneous coronary angioplasty 07/2016   a)  07/2016: 100% pCx -- DES PCI with 4.0 x 16 mm Synergy DES. 65% ostial OM 3 (relatively small caliber - Med Rx);; b) 03/27/18 PCI/DES to dLCX/OM3 x1. Patent pCX DES. 70% dRCA trifurcation lesion (Med Rx). Normal EF;; 07/2022: Patent Cx/LPAV Stents patent. ~mRCA 95%--> dRCA 100% (prior 90% bifurcation lesion). pLAD 55%, m-dLAD 45%. L-R Collaterals fill PAVPL & PDA.   COPD (chronic obstructive pulmonary disease) (HCC) 2018   mild   DVT (deep venous thrombosis) (HCC)    Right upper arm DVT on 2 occasions in the remote past   Dysrhythmia    V tac   Ejection fraction    LV function normal, echo, February, 2010   GERD (gastroesophageal reflux disease)    History of tobacco abuse 2015   43-pack-year history   Hyperlipidemia    Hypertension    Mitral regurgitation    Mild, echo, February, 2010   Palpitations    Mild in the past   Peripheral vascular disease (HCC) 1980s   DVT   Pre-diabetes    Statin intolerance    Felt poorly after Lipitor, Crestor , TriCor   STEMI involving left circumflex coronary artery (HCC) 08/04/2016   Occluded very large caliber codominant circumflex - PCI with single Synergy DES   Past Surgical History:  Procedure Laterality Date   CARDIAC CATHETERIZATION N/A 08/04/2016   Procedure: Left Heart Cath and Coronary Angiography;  Surgeon: Arleen Lacer, MD;  Location: [facility] INVASIVE CV LAB;  Service: Cardiovascular;  Laterality: N/A: 100% pCx --> PCI. residual 65% pOM3 (small).  ~EF 35-45%  with lateral HK.   CARDIAC CATHETERIZATION  N/A 08/04/2016   Procedure: CORONARY STENT INTERVENTION;  Surgeon: Arleen Lacer, MD;  Location: [facility] INVASIVE CV LAB;  Service: Cardiovascular;  Laterality: N/A: pCx 100%-0%: Synergy DES 4.0 x 16 (4.6 mm)   CHONDROPLASTY Right 04/04/2017   Procedure: CHONDROPLASTY;  Surgeon: Neil Balls, MD;  Location: [facility];  Service: Orthopedics;  Laterality: Right;   CORONARY STENT INTERVENTION N/A 03/27/2018   Procedure: CORONARY STENT INTERVENTION;   Surgeon: Swaziland, Peter M, MD;  Location: [facility] INVASIVE CV LAB;  Service: Cardiovascular:: dCx-OM3 95%-0%: DES PCI - Synergy 2.25 x 16 mm.   GANGLION CYST EXCISION     oct 2017   HERNIA REPAIR     umbilical hernia 2017 oct   KNEE ARTHROSCOPY Right 04/04/2017   Procedure: ARTHROSCOPY RIGHT KNEE, WITH MEIDAL FEMORAL CONDYLE PATELLAR MEDIAL FEMORAL JOINT PLICA EXCISION ;  Surgeon: Neil Balls, MD;  Location: [facility];  Service: Orthopedics;  Laterality: Right;   LEFT HEART CATH AND CORONARY ANGIOGRAPHY N/A 03/27/2018   Procedure: LEFT HEART CATH AND CORONARY ANGIOGRAPHY;  Surgeon: Swazil[place], Peter M, MD;  Location: [facility] INVASIVE CV LAB;  Service: Cardiovascular:   2 vessel obstructive CAD: - 95% dLCx/OM3 --Successful PCI of the dLCx/OM3 with SYNERGY DES 2.25X16, -70% distal RCA at trifurcation (Med Management).  Widely patent pLCx DES. Low normal LV function (50-55%), Normal LVEDP    LEFT HEART CATH AND CORONARY ANGIOGRAPHY N/A 08/30/2022   Procedure: LEFT HEART CATH AND CORONARY ANGIOGRAPHY;  Surgeon: Arleen Lacer, MD;  Location: [facility] INVASIVE CV LAB;  Service: CV. ::pLAD 55%, m-d LAD 45%. pRCA-dRCA 95% (SubTO) -> dRCA 100% CTO prior to Bifurcation w/ 90-95% into PDA & PAV - both fill via L-R collaterals. pCx DES & LPAV stent patent.  No AS. LVEDP 16 mmHg.   NM MYOVIEW  LTD  08/23/2022   INTERMEDIATE RISK.  EF~39%.  Infero-septal wall hypokinesis.  No ST segment changes.  Partially veritable medium sized, moderate severity defect in the mid to basal inferoseptal wall.; Considered Infarct w/ Peri-Infarct Ischemia. => Referred for Cath.   REVERSE SHOULDER ARTHROPLASTY Left 06/14/2023   Procedure: REVERSE SHOULDER ARTHROPLASTY;  Surgeon: Ellard Gunning, MD;  Location: [facility];  Service: Orthopedics;  Laterality: Left;    TOTAL HIP ARTHROPLASTY Left 04/08/2020   Procedure: TOTAL HIP ARTHROPLASTY ANTERIOR APPROACH;  Surgeon: Claiborne Crew, MD;  Location: [facility];  Service: Orthopedics;  Laterality: Left;  70  mins   TOTAL HIP ARTHROPLASTY Right 05/02/2022   Procedure: TOTAL HIP ARTHROPLASTY ANTERIOR APPROACH;  Surgeon: Claiborne Crew, MD;  Location: [facility];  Service: Orthopedics;  Laterality: Right;  90   TRANSTHORACIC ECHOCARDIOGRAM  08/22/2022   Normal LV Size &Fxn - EF ~ 55-65%. No RWMA. Mild Conc LVH with Severe LA dilation (but ? Normal Diastolic Fxn). Normal RV size & Fxn with normal RVP & RAP. Normal MV & AoV.   TRANSTHORACIC ECHOCARDIOGRAM  01/11/2017   a) a) 07/2016: post STEMI: EF 40-45%. Mild concentric LVH. Severe HK of basal-mid inferolateral wall consistent with infarct in this distribution. GR 1 DD.;; b) 12/2016: EF 50-55%. GR 1 DD. Mild to moderate LA dilation.    Allergies  Allergies  Allergen Reactions   Metformin      After starting metformin - Severe hip pain bilaterally worse on left. Could not turn neck. Back pain. Symptoms drastically improved off metformin     Home Medications    Prior to Admission medications   Medication Sig Start Date End Date Taking? Authorizing Provider  acetaminophen  (TYLENOL ) 650 MG  CR tablet Take 1,300 mg by mouth every 8 (eight) hours as needed for pain.    [provider]  amLODipine  (NORVASC ) 2.5 MG tablet Take 1 tablet (2.5 mg total) by mouth daily. 07/12/23 10/10/23  Arleen Lacer, MD  carvedilol  (COREG ) 12.5 MG tablet TAKE 1 TABLET BY MOUTH 2 TIMES DAILY 04/20/23   Arleen Lacer, MD  clopidogrel  (PLAVIX ) 75 MG tablet Take 1 tablet (75 mg total) by mouth daily. TAKE 1 TABLET BY MOUTH EVERY DAY. Per cardiology 10/02/23   Almira Jaeger, MD  cyclobenzaprine  (FLEXERIL ) 10 MG tablet Take 1 tablet (10 mg total) by mouth 3 (three) times daily as needed for muscle spasms. 06/14/23   Shuford, Sherrlyn Dolores, PA-C  LORazepam  (ATIVAN ) 0.5 MG tablet TAKE 1 TABLET BY MOUTH AT BEDTIME AS NEEDED FOR ANXIETY. DO not drive FOR 8 hours AFTER taking 09/24/23   Almira Jaeger, MD  nitroGLYCERIN  (NITROSTAT ) 0.4 MG SL tablet place 1 TABLET UNDER THE TONGUE  EVERY 5 MINUTES AS NEEDED FOR CHEST PAIN UP TO 3 doses 07/02/23   Almira Jaeger, MD  pantoprazole  (PROTONIX ) 40 MG tablet TAKE 1 TABLET BY MOUTH EVERY DAY 08/09/23   Arleen Lacer, MD  ranolazine  (RANEXA ) 500 MG 12 hr tablet Take 1 tablet (500 mg total) by mouth 2 (two) times daily. 08/14/23   Arleen Lacer, MD  rosuvastatin  (CRESTOR ) 40 MG tablet TAKE 1 TABLET BY MOUTH EVERY DAY Patient taking differently: Take 40 mg by mouth every evening. 04/20/23   Almira Jaeger, MD  triamcinolone  lotion (KENALOG ) 0.1 % Apply 1 application  topically 2 (two) times daily as needed (skin irritation). Mix lotion with cream and apply twice daily to affected area    [provider]    Physical Exam    Vital Signs:  Jasraj Gallian does not have vital signs available for review today.  Given telephonic nature of communication, physical exam is limited. AAOx3. NAD. Normal affect.  Speech and respirations are unlabored.  Accessory Clinical Findings    None  Assessment & Plan    1.  Preoperative Cardiovascular Risk Assessment:Procedure:   TRUSP BIOPSY   Date of Surgery:  Clearance TBD                                  Surgeon:   Surgeon's Group or Practice Name:  [facility] Phone number:  [phone] Fax number:  [phone]      Primary Cardiologist: Randene Bustard, MD  Chart reviewed as part of pre-operative protocol coverage. Given past medical history and time since last visit, based on ACC/AHA guidelines, Shaheen Staloch would be at acceptable risk for the planned procedure without further cardiovascular testing.   His RCRI is moderate risk, 6.6% risk of major cardiac event.  He is able to complete greater than 4 METS of physical activity.  Patient was advised that if he develops new symptoms prior to surgery to contact our office to arrange a follow-up appointment.  He verbalized understanding.  His Plavix  may be held for 5 days prior to his procedure.  Please resume as soon as hemostasis is achieved.   I will route this recommendation to the requesting party via Epic fax function and remove from pre-op pool.       Time:   Today, I have spent 5 minutes with the patient with telehealth technology discussing medical history, symptoms, and management plan.  I spent 10 minutes reviewing his past medical history, cardiac medications and cardiac tests   Carie Charity, NP  11/27/2023, 6:55 AM

## 2023-11-29 DIAGNOSIS — R972 Elevated prostate specific antigen [PSA]: Secondary | ICD-10-CM | POA: Diagnosis not present

## 2023-12-02 ENCOUNTER — Encounter: Payer: Self-pay | Admitting: Family Medicine

## 2023-12-08 ENCOUNTER — Ambulatory Visit (HOSPITAL_COMMUNITY)

## 2023-12-08 ENCOUNTER — Encounter (HOSPITAL_COMMUNITY): Payer: Self-pay

## 2023-12-20 ENCOUNTER — Emergency Department (HOSPITAL_COMMUNITY)

## 2023-12-20 ENCOUNTER — Other Ambulatory Visit: Payer: Self-pay

## 2023-12-20 ENCOUNTER — Emergency Department (HOSPITAL_COMMUNITY)
Admission: EM | Admit: 2023-12-20 | Discharge: 2023-12-20 | Disposition: A | Discharge status: Home / Self Care | Attending: Emergency Medicine | Admitting: Emergency Medicine

## 2023-12-20 ENCOUNTER — Ambulatory Visit: Payer: Self-pay

## 2023-12-20 DIAGNOSIS — R4020 Unspecified coma: Secondary | ICD-10-CM | POA: Diagnosis not present

## 2023-12-20 DIAGNOSIS — R739 Hyperglycemia, unspecified: Secondary | ICD-10-CM | POA: Insufficient documentation

## 2023-12-20 DIAGNOSIS — W01198A Fall on same level from slipping, tripping and stumbling with subsequent striking against other object, initial encounter: Secondary | ICD-10-CM | POA: Insufficient documentation

## 2023-12-20 DIAGNOSIS — Y92002 Bathroom of unspecified non-institutional (private) residence single-family (private) house as the place of occurrence of the external cause: Secondary | ICD-10-CM | POA: Insufficient documentation

## 2023-12-20 DIAGNOSIS — Z7901 Long term (current) use of anticoagulants: Secondary | ICD-10-CM | POA: Insufficient documentation

## 2023-12-20 DIAGNOSIS — Z96612 Presence of left artificial shoulder joint: Secondary | ICD-10-CM | POA: Diagnosis not present

## 2023-12-20 DIAGNOSIS — J439 Emphysema, unspecified: Secondary | ICD-10-CM | POA: Diagnosis not present

## 2023-12-20 DIAGNOSIS — S80211A Abrasion, right knee, initial encounter: Secondary | ICD-10-CM | POA: Insufficient documentation

## 2023-12-20 DIAGNOSIS — E1165 Type 2 diabetes mellitus with hyperglycemia: Secondary | ICD-10-CM | POA: Diagnosis not present

## 2023-12-20 DIAGNOSIS — S50311A Abrasion of right elbow, initial encounter: Secondary | ICD-10-CM | POA: Insufficient documentation

## 2023-12-20 DIAGNOSIS — R55 Syncope and collapse: Secondary | ICD-10-CM | POA: Insufficient documentation

## 2023-12-20 DIAGNOSIS — G9389 Other specified disorders of brain: Secondary | ICD-10-CM | POA: Diagnosis not present

## 2023-12-20 LAB — CBC WITH DIFFERENTIAL/PLATELET
Abs Immature Granulocytes: 0.03 10*3/uL (ref 0.00–0.07)
Basophils Absolute: 0.1 10*3/uL (ref 0.0–0.1)
Basophils Relative: 1 %
Eosinophils Absolute: 0.1 10*3/uL (ref 0.0–0.5)
Eosinophils Relative: 2 %
HCT: 39.6 % (ref 39.0–52.0)
Hemoglobin: 12.8 g/dL — ABNORMAL LOW (ref 13.0–17.0)
Immature Granulocytes: 0 %
Lymphocytes Relative: 16 %
Lymphs Abs: 1.2 10*3/uL (ref 0.7–4.0)
MCH: 26.6 pg (ref 26.0–34.0)
MCHC: 32.3 g/dL (ref 30.0–36.0)
MCV: 82.2 fL (ref 80.0–100.0)
Monocytes Absolute: 1.1 10*3/uL — ABNORMAL HIGH (ref 0.1–1.0)
Monocytes Relative: 14 %
Neutro Abs: 5.1 10*3/uL (ref 1.7–7.7)
Neutrophils Relative %: 67 %
Platelets: 195 10*3/uL (ref 150–400)
RBC: 4.82 MIL/uL (ref 4.22–5.81)
RDW: 16.8 % — ABNORMAL HIGH (ref 11.5–15.5)
WBC: 7.6 10*3/uL (ref 4.0–10.5)
nRBC: 0 % (ref 0.0–0.2)

## 2023-12-20 LAB — BASIC METABOLIC PANEL WITH GFR
Anion gap: 10 (ref 5–15)
BUN: 16 mg/dL (ref 8–23)
CO2: 23 mmol/L (ref 22–32)
Calcium: 9.4 mg/dL (ref 8.9–10.3)
Chloride: 105 mmol/L (ref 98–111)
Creatinine, Ser: 0.94 mg/dL (ref 0.61–1.24)
GFR, Estimated: >60 mL/min (ref >60)
Glucose, Bld: 180 mg/dL — ABNORMAL HIGH (ref 70–99)
Potassium: 4 mmol/L (ref 3.5–5.1)
Sodium: 138 mmol/L (ref 135–145)

## 2023-12-20 LAB — URINALYSIS, ROUTINE W REFLEX MICROSCOPIC
Bilirubin Urine: NEGATIVE
Glucose, UA: NEGATIVE mg/dL
Hgb urine dipstick: NEGATIVE
Ketones, ur: NEGATIVE mg/dL
Leukocytes,Ua: NEGATIVE
Nitrite: NEGATIVE
Protein, ur: NEGATIVE mg/dL
Specific Gravity, Urine: 1.026 (ref 1.005–1.030)
pH: 5 (ref 5.0–8.0)

## 2023-12-20 LAB — RESP PANEL BY RT-PCR (RSV, FLU A&B, COVID)  RVPGX2
Influenza A by PCR: NEGATIVE
Influenza B by PCR: NEGATIVE
Resp Syncytial Virus by PCR: NEGATIVE
SARS Coronavirus 2 by RT PCR: NEGATIVE

## 2023-12-20 LAB — PROTIME-INR
INR: 1.1 (ref 0.8–1.2)
Prothrombin Time: 14.1 s (ref 11.4–15.2)

## 2023-12-20 LAB — CBG MONITORING, ED: Glucose-Capillary: 200 mg/dL — ABNORMAL HIGH (ref 70–99)

## 2023-12-20 NOTE — ED Triage Notes (Signed)
 Pt. Stated, I passed last night going to the bathroom but I walked back to bed and got my onself up. Pt takes Plavix . I didn't hit my head , but I did hit my elbow and knees.

## 2023-12-20 NOTE — ED Provider Notes (Signed)
 [place] EMERGENCY DEPARTMENT AT [facility] Provider Note   CSN: [id number] Arrival date & time: 12/20/23  4098     History  Chief Complaint  Patient presents with   Loss of Consciousness   Fall    Terry Wright is a 68 y.o. male.  Pt c/o passing out last night. States felt normal/fine yesterday, including when going to bed. He got up to use bathroom, and was urinating when fainted. Denies hitting head. Brief loc. Wife heard him fall and went to check on him. No seizure activity observed. Pt came to and was alert/oriented, and able to get self back to bed. This AM felt fine. Ambulatory, eating/drinking. No headache. No neck or back pain. No current or recent chest pain or discomfort. No sob or unusual  doe. No palpitations. No abd pain or nvd. No change in stools, no rectal bleeding or melena. No dysuria or gu c/o. No extremity pain, swelling or injury. On plavix , no other anticoagulant. No other recent syncope. No new neuro c/o. No change in speech or vision. No numbness/weakness or loss of normal functional ability. Mild recent uri symptoms/nasal congestion/rhinorrhea.   The history is provided by the patient and medical records.  Loss of Consciousness Associated symptoms: no chest pain, no confusion, no fever, no headaches, no palpitations, no shortness of breath, no vomiting and no weakness   Fall Pertinent negatives include no chest pain, no abdominal pain, no headaches and no shortness of breath.       Home Medications Prior to Admission medications   Medication Sig Start Date End Date Taking? Authorizing Provider  acetaminophen  (TYLENOL ) 650 MG CR tablet Take 1,300 mg by mouth every 8 (eight) hours as needed for pain.    [provider]  amLODipine  (NORVASC ) 2.5 MG tablet Take 1 tablet (2.5 mg total) by mouth daily. 07/12/23 10/10/23  Arleen Lacer, MD  carvedilol  (COREG ) 12.5 MG tablet TAKE 1 TABLET BY MOUTH 2 TIMES DAILY 04/20/23   Arleen Lacer, MD  clopidogrel  (PLAVIX ) 75 MG tablet Take 1 tablet (75 mg total) by mouth daily. TAKE 1 TABLET BY MOUTH EVERY DAY. Per cardiology 10/02/23   Almira Jaeger, MD  cyclobenzaprine  (FLEXERIL ) 10 MG tablet Take 1 tablet (10 mg total) by mouth 3 (three) times daily as needed for muscle spasms. 06/14/23   Shuford, Sherrlyn Dolores, PA-C  LORazepam  (ATIVAN ) 0.5 MG tablet TAKE 1 TABLET BY MOUTH AT BEDTIME AS NEEDED FOR ANXIETY. DO not drive FOR 8 hours AFTER taking 09/24/23   Almira Jaeger, MD  nitroGLYCERIN  (NITROSTAT ) 0.4 MG SL tablet place 1 TABLET UNDER THE TONGUE EVERY 5 MINUTES AS NEEDED FOR CHEST PAIN UP TO 3 doses 07/02/23   Almira Jaeger, MD  pantoprazole  (PROTONIX ) 40 MG tablet TAKE 1 TABLET BY MOUTH EVERY DAY 08/09/23   Arleen Lacer, MD  ranolazine  (RANEXA ) 500 MG 12 hr tablet Take 1 tablet (500 mg total) by mouth 2 (two) times daily. 08/14/23   Arleen Lacer, MD  rosuvastatin  (CRESTOR ) 40 MG tablet TAKE 1 TABLET BY MOUTH EVERY DAY Patient taking differently: Take 40 mg by mouth every evening. 04/20/23   Almira Jaeger, MD  triamcinolone  lotion (KENALOG ) 0.1 % Apply 1 application  topically 2 (two) times daily as needed (skin irritation). Mix lotion with cream and apply twice daily to affected area    [provider]      Allergies    Metformin     Review of  Systems   Review of Systems  Constitutional:  Negative for chills and fever.  HENT:  Positive for congestion and rhinorrhea. Negative for sore throat.   Eyes:  Negative for redness and visual disturbance.  Respiratory:  Negative for cough and shortness of breath.   Cardiovascular:  Positive for syncope. Negative for chest pain, palpitations and leg swelling.  Gastrointestinal:  Negative for abdominal pain, blood in stool, diarrhea and vomiting.  Genitourinary:  Negative for dysuria and flank pain.  Musculoskeletal:  Negative for back pain and neck pain.  Skin:  Negative for rash.  Neurological:  Negative for speech  difficulty, weakness, numbness and headaches.  Psychiatric/Behavioral:  Negative for confusion.     Physical Exam Updated Vital Signs BP 132/81   Pulse (!) 59   Temp 98.5 F (36.9 C) (Oral)   Resp 16   Ht 1.854 m (6\' 1" )   Wt 95.3 kg   SpO2 98%   BMI 27.71 kg/m  Physical Exam Vitals and nursing note reviewed.  Constitutional:      Appearance: Normal appearance. He is well-developed.  HENT:     Head: Atraumatic.     Comments: No facial or scalp sts, contusion or bruising.     Nose: Nose normal.     Mouth/Throat:     Mouth: Mucous membranes are moist.     Pharynx: Oropharynx is clear.  Eyes:     General: No scleral icterus.    Conjunctiva/sclera: Conjunctivae normal.     Pupils: Pupils are equal, round, and reactive to light.  Neck:     Vascular: No carotid bruit.     Trachea: No tracheal deviation.  Cardiovascular:     Rate and Rhythm: Normal rate and regular rhythm.     Pulses: Normal pulses.     Heart sounds: Normal heart sounds. No murmur heard.    No friction rub. No gallop.  Pulmonary:     Effort: Pulmonary effort is normal. No accessory muscle usage or respiratory distress.     Breath sounds: Normal breath sounds.  Chest:     Chest wall: No tenderness.  Abdominal:     General: Bowel sounds are normal. There is no distension.     Palpations: Abdomen is soft. There is no mass.     Tenderness: There is no abdominal tenderness. There is no guarding.  Genitourinary:    Comments: No cva tenderness. Musculoskeletal:        General: No swelling.     Cervical back: Normal range of motion and neck supple. No rigidity.     Comments: CTLS spine, non tender, aligned, no step off. Good rom bil extremities without pain or focal bony tenderness. V small, superficial abrasions to right knee and right elbow.   Skin:    General: Skin is warm and dry.     Findings: No rash.  Neurological:     Mental Status: He is alert.     Comments: Alert, speech clear. GCS 15.  Motor/sens grossly intact. No pronator drift. Stre 5/5.   Psychiatric:        Mood and Affect: Mood normal.     ED Results / Procedures / Treatments   Labs (all labs ordered are listed, but only abnormal results are displayed) Results for orders placed or performed during the hospital encounter of 12/20/23  CBC with Differential   Collection Time: 12/20/23  9:09 AM  Result Value Ref Range   WBC 7.6 4.0 - 10.5 K/uL   RBC 4.82  4.22 - 5.81 MIL/uL   Hemoglobin 12.8 (L) 13.0 - 17.0 g/dL   HCT 40.9 81.1 - 91.4 %   MCV 82.2 80.0 - 100.0 fL   MCH 26.6 26.0 - 34.0 pg   MCHC 32.3 30.0 - 36.0 g/dL   RDW 78.2 (H) 95.6 - 21.3 %   Platelets 195 150 - 400 K/uL   nRBC 0.0 0.0 - 0.2 %   Neutrophils Relative % 67 %   Neutro Abs 5.1 1.7 - 7.7 K/uL   Lymphocytes Relative 16 %   Lymphs Abs 1.2 0.7 - 4.0 K/uL   Monocytes Relative 14 %   Monocytes Absolute 1.1 (H) 0.1 - 1.0 K/uL   Eosinophils Relative 2 %   Eosinophils Absolute 0.1 0.0 - 0.5 K/uL   Basophils Relative 1 %   Basophils Absolute 0.1 0.0 - 0.1 K/uL   Immature Granulocytes 0 %   Abs Immature Granulocytes 0.03 0.00 - 0.07 K/uL  Basic metabolic panel   Collection Time: 12/20/23  9:09 AM  Result Value Ref Range   Sodium 138 135 - 145 mmol/L   Potassium 4.0 3.5 - 5.1 mmol/L   Chloride 105 98 - 111 mmol/L   CO2 23 22 - 32 mmol/L   Glucose, Bld 180 (H) 70 - 99 mg/dL   BUN 16 8 - 23 mg/dL   Creatinine, Ser 0.86 0.61 - 1.24 mg/dL   Calcium  9.4 8.9 - 10.3 mg/dL   GFR, Estimated >57 >84 mL/min   Anion gap 10 5 - 15  Urinalysis, Routine w reflex microscopic -Urine, Clean Catch   Collection Time: 12/20/23  9:09 AM  Result Value Ref Range   Color, Urine YELLOW YELLOW   APPearance HAZY (A) CLEAR   Specific Gravity, Urine 1.026 1.005 - 1.030   pH 5.0 5.0 - 8.0   Glucose, UA NEGATIVE NEGATIVE mg/dL   Hgb urine dipstick NEGATIVE NEGATIVE   Bilirubin Urine NEGATIVE NEGATIVE   Ketones, ur NEGATIVE NEGATIVE mg/dL   Protein, ur NEGATIVE  NEGATIVE mg/dL   Nitrite NEGATIVE NEGATIVE   Leukocytes,Ua NEGATIVE NEGATIVE  Protime-INR   Collection Time: 12/20/23  9:09 AM  Result Value Ref Range   Prothrombin Time 14.1 11.4 - 15.2 seconds   INR 1.1 0.8 - 1.2  CBG monitoring, ED   Collection Time: 12/20/23  9:10 AM  Result Value Ref Range   Glucose-Capillary 200 (H) 70 - 99 mg/dL  Resp panel by RT-PCR (RSV, Flu A&B, Covid) Anterior Nasal Swab   Collection Time: 12/20/23  9:57 AM   Specimen: Anterior Nasal Swab  Result Value Ref Range   SARS Coronavirus 2 by RT PCR NEGATIVE NEGATIVE   Influenza A by PCR NEGATIVE NEGATIVE   Influenza B by PCR NEGATIVE NEGATIVE   Resp Syncytial Virus by PCR NEGATIVE NEGATIVE   CT Head Wo Contrast Result Date: 12/20/2023 CLINICAL DATA:  Provided history: Syncope/presyncope, cerebrovascular cause suspected. EXAM: CT HEAD WITHOUT CONTRAST TECHNIQUE: Contiguous axial images were obtained from the base of the skull through the vertex without intravenous contrast. RADIATION DOSE REDUCTION: This exam was performed according to the departmental dose-optimization program which includes automated exposure control, adjustment of the mA and/or kV according to patient size and/or use of iterative reconstruction technique. COMPARISON:  None. FINDINGS: Brain: Mild generalized cerebral atrophy. Small focus of chronic encephalomalacia/gliosis within the right frontal operculum. There is no acute intracranial hemorrhage. No demarcated cortical infarct. No extra-axial fluid collection. No evidence of an intracranial mass. No midline shift. Vascular: No hyperdense  vessel.  Atherosclerotic calcifications. Skull: No calvarial fracture or aggressive osseous lesion. Sinuses/Orbits: No mass or acute finding within the imaged orbits. Minimal mucosal thickening within the bilateral ethmoid, sphenoid and maxillary sinuses. IMPRESSION: 1.  No acute intracranial finding. 2. Small focus of chronic encephalomalacia/gliosis within the right  frontal operculum. This may reflect a chronic infarct (MCA territory) or may be post-traumatic in etiology. 3. Mild generalized cerebral atrophy. 4. Minor paranasal sinus mucosal thickening. Electronically Signed   By: Bascom Lily D.O.   On: 12/20/2023 14:21   DG Chest 2 View Result Date: 12/20/2023 CLINICAL DATA:  Loss of consciousness. EXAM: CHEST - 2 VIEW COMPARISON:  Chest radiograph dated 10/06/2023. FINDINGS: Background of emphysema. No focal consolidation, pleural effusion, pneumothorax. The cardiac silhouette is within normal limits. Degenerative changes of the spine. No acute osseous pathology. Left shoulder arthroplasty. IMPRESSION: 1. No active cardiopulmonary disease. 2. Emphysema. Electronically Signed   By: Angus Bark M.D.   On: 12/20/2023 10:10    EKG EKG Interpretation Date/Time:  Thursday Dec 20 2023 08:59:37 EDT Ventricular Rate:  67 PR Interval:  234 QRS Duration:  96 QT Interval:  398 QTC Calculation: 420 R Axis:   49  Text Interpretation: Sinus rhythm with 1st degree A-V block Confirmed by Guadalupe Lee ([id number]) on 12/20/2023 9:49:29 AM  Radiology CT Head Wo Contrast Result Date: 12/20/2023 CLINICAL DATA:  Provided history: Syncope/presyncope, cerebrovascular cause suspected. EXAM: CT HEAD WITHOUT CONTRAST TECHNIQUE: Contiguous axial images were obtained from the base of the skull through the vertex without intravenous contrast. RADIATION DOSE REDUCTION: This exam was performed according to the departmental dose-optimization program which includes automated exposure control, adjustment of the mA and/or kV according to patient size and/or use of iterative reconstruction technique. COMPARISON:  None. FINDINGS: Brain: Mild generalized cerebral atrophy. Small focus of chronic encephalomalacia/gliosis within the right frontal operculum. There is no acute intracranial hemorrhage. No demarcated cortical infarct. No extra-axial fluid collection. No evidence of an intracranial  mass. No midline shift. Vascular: No hyperdense vessel.  Atherosclerotic calcifications. Skull: No calvarial fracture or aggressive osseous lesion. Sinuses/Orbits: No mass or acute finding within the imaged orbits. Minimal mucosal thickening within the bilateral ethmoid, sphenoid and maxillary sinuses. IMPRESSION: 1.  No acute intracranial finding. 2. Small focus of chronic encephalomalacia/gliosis within the right frontal operculum. This may reflect a chronic infarct (MCA territory) or may be post-traumatic in etiology. 3. Mild generalized cerebral atrophy. 4. Minor paranasal sinus mucosal thickening. Electronically Signed   By: Bascom Lily D.O.   On: 12/20/2023 14:21   DG Chest 2 View Result Date: 12/20/2023 CLINICAL DATA:  Loss of consciousness. EXAM: CHEST - 2 VIEW COMPARISON:  Chest radiograph dated 10/06/2023. FINDINGS: Background of emphysema. No focal consolidation, pleural effusion, pneumothorax. The cardiac silhouette is within normal limits. Degenerative changes of the spine. No acute osseous pathology. Left shoulder arthroplasty. IMPRESSION: 1. No active cardiopulmonary disease. 2. Emphysema. Electronically Signed   By: Angus Bark M.D.   On: 12/20/2023 10:10    Procedures Procedures    Medications Ordered in ED Medications - No data to display  ED Course/ Medical Decision Making/ A&P Clinical Course as of 12/20/23 1442  Thu Dec 20, 2023  1017 Basic metabolic panel(!) No acute electrolyte derangement on BMP. Creatinine stable. [MQ]  1018 CBC with Differential(!) [MQ]    Clinical Course User Index [MQ] Ivin Marrow, MD  Medical Decision Making Problems Addressed: Hyperglycemia: acute illness or injury Micturition syncope: acute illness or injury with systemic symptoms that poses a threat to life or bodily functions Syncope and collapse: acute illness or injury with systemic symptoms that poses a threat to life or bodily  functions  Amount and/or Complexity of Data Reviewed Independent Historian: spouse    Details: hx External Data Reviewed: notes. Labs: ordered. Decision-making details documented in ED Course. Radiology: ordered and independent interpretation performed. Decision-making details documented in ED Course. ECG/medicine tests: ordered and independent interpretation performed. Decision-making details documented in ED Course.  Risk Decision regarding hospitalization.   Iv ns. Continuous pulse ox and cardiac monitoring. Labs ordered/sent. Imaging ordered.   Differential diagnosis includes syncope, vasovagal episode, dysrhythmia, dehydration, anemia, etc. Dispo decision including potential need for admission considered - will get labs and imaging and reassess.   Reviewed nursing notes and prior charts for additional history. External reports reviewed. Additional history from: spouse.   Cardiac monitor: sinus rhythm, rate 70.  Labs reviewed/interpreted by me - wbc and hct normal. Glucose mildly high, chem otherwise unremarkable.  Ua neg for uti. Covid and flu neg.   Xrays reviewed/interpreted by me - no pna.   CT reviewed/interpreted by me - no hem.   Po fluids/food, ambulate in hall. No faintness or dizziness. Nsr on monitor.   Rec close pcp f/u.  Return precautions provided.          Final Clinical Impression(s) / ED Diagnoses Final diagnoses:  None    Rx / DC Orders ED Discharge Orders     None         Guadalupe Lee, MD 12/20/23 1446

## 2023-12-20 NOTE — Telephone Encounter (Signed)
 I see Emergency Department on chart so appears he went- will follow up after discharge

## 2023-12-20 NOTE — Telephone Encounter (Signed)
Please see triage note as FYI

## 2023-12-20 NOTE — Discharge Instructions (Signed)
 It was our pleasure to provide your ER care today - we hope that you feel better. Drink plenty of fluids/stay well hydrated.    From today's labs, your glucose is elevated (180) - drink adequate water /fluids/stay well hydrated, follow diabetes meal plan, and follow up with primary care doctor in the coming week as relates fainting episode as well as elevated blood sugar. Also follow up with cardiologist regarding recent fainting episode.   Return to ER If worse, new symptoms, fevers, chest pain, trouble breathing, recurrent weakness and/or fainting, or other concern.

## 2023-12-20 NOTE — Telephone Encounter (Signed)
  Chief Complaint: syncope Symptoms: syncopal episode (lasted seconds) last night, right hand/elbow/knee bruising Frequency: occurred last night between 1030-1100 pm Pertinent Negatives: Patient denies chest pain, difficulty breathing, head injury, headaches, numbness, weakness, palpitations, dizziness, nausea, vomiting, diarrhea, blood in stool, fever Disposition: [x] ED /[] Urgent Care (no appt availability in office) / [] Appointment(In office/virtual)/ []  [facility]/ [] Home Care/ [] Refused Recommended Disposition /[] [facility]/ []  Follow-up with PCP Additional Notes: Patient states today was his last dose of his blood thinner. He states he feels fine now and no symptoms related to cardiac or neurological. Advised patient due to his significant cardiac history, recommend calling 911 and going straight to ED. Patient verbalizes understanding.  Copied from CRM [id number]. Topic: Clinical - Red Word Triage >> Dec 20, 2023  8:09 AM Albertha Alosa wrote: [facility] that prompted transfer to Nurse Triage: Patient called in stated that over the night he went to the bathroom and passed out, and fell, he didn't hit its head Reason for Disposition  History of heart problems (e.g., congestive heart failure, heart attack)  Answer Assessment - Initial Assessment Questions 1. ONSET: "How long were you unconscious?" (minutes) "When did it happen?"     Less than 30 seconds. Around 1030pm to 11pm last night.  2. CONTENT: "What happened during period of unconsciousness?" (e.g., seizure activity)      Fell and passed out, no seizure like activity.  3. MENTAL STATUS: "Alert and oriented now?" (oriented x 3 = name, month, location)      Alert and orient x 3.  4. TRIGGER: "What do you think caused the fainting?" "What were you doing just before you fainted?"  (e.g., exercise, sudden standing up, prolonged standing)     Patient was asleep, got up to go to the bathroom. He states he was standing up  urinating when he passed out. He states he doesn't think he stood up suddenly.  5. RECURRENT SYMPTOM: "Have you ever passed out before?" If Yes, ask: "When was the last time?" and "What happened that time?"      No.  6. INJURY: "Did you sustain any injury during the fall?"      Bruising on right hand, elbow and knee. Soreness.  7. CARDIAC SYMPTOMS: "Have you had any of the following symptoms: chest pain, difficulty breathing, palpitations?"     No.  8. NEUROLOGIC SYMPTOMS: "Have you had any of the following symptoms: headache, numbness, vertigo, weakness?"     No.  9. GI SYMPTOMS: "Have you had any of the following symptoms: abdomen pain, vomiting, diarrhea, blood in stools?"     No.  10. OTHER SYMPTOMS: "Do you have any other symptoms?"       Patient states for the last few days he has had a "chest cold" and starting taking Coricidin HBP cough and cold last night. Congestion.  11. PREGNANCY: "Is there any chance you are pregnant?" "When was your last menstrual period?"       N/A.  Protocols used: [employer]

## 2023-12-24 DIAGNOSIS — J019 Acute sinusitis, unspecified: Secondary | ICD-10-CM | POA: Diagnosis not present

## 2023-12-24 DIAGNOSIS — J208 Acute bronchitis due to other specified organisms: Secondary | ICD-10-CM | POA: Diagnosis not present

## 2023-12-24 DIAGNOSIS — B9689 Other specified bacterial agents as the cause of diseases classified elsewhere: Secondary | ICD-10-CM | POA: Diagnosis not present

## 2023-12-26 DIAGNOSIS — C61 Malignant neoplasm of prostate: Secondary | ICD-10-CM | POA: Diagnosis not present

## 2023-12-26 DIAGNOSIS — N4289 Other specified disorders of prostate: Secondary | ICD-10-CM | POA: Diagnosis not present

## 2023-12-26 DIAGNOSIS — R350 Frequency of micturition: Secondary | ICD-10-CM | POA: Diagnosis not present

## 2023-12-26 DIAGNOSIS — R972 Elevated prostate specific antigen [PSA]: Secondary | ICD-10-CM | POA: Diagnosis not present

## 2024-01-03 DIAGNOSIS — N401 Enlarged prostate with lower urinary tract symptoms: Secondary | ICD-10-CM | POA: Diagnosis not present

## 2024-01-03 DIAGNOSIS — C61 Malignant neoplasm of prostate: Secondary | ICD-10-CM | POA: Diagnosis not present

## 2024-01-03 DIAGNOSIS — R350 Frequency of micturition: Secondary | ICD-10-CM | POA: Diagnosis not present

## 2024-01-09 ENCOUNTER — Other Ambulatory Visit: Payer: Self-pay | Admitting: Cardiology

## 2024-01-09 ENCOUNTER — Ambulatory Visit: Payer: PPO | Attending: Cardiology | Admitting: Cardiology

## 2024-01-09 ENCOUNTER — Encounter: Payer: Self-pay | Admitting: Cardiology

## 2024-01-09 VITALS — BP 124/72 | HR 70 | Ht 73.0 in | Wt 210.2 lb

## 2024-01-09 DIAGNOSIS — C61 Malignant neoplasm of prostate: Secondary | ICD-10-CM

## 2024-01-09 DIAGNOSIS — E1159 Type 2 diabetes mellitus with other circulatory complications: Secondary | ICD-10-CM | POA: Diagnosis not present

## 2024-01-09 DIAGNOSIS — I2121 ST elevation (STEMI) myocardial infarction involving left circumflex coronary artery: Secondary | ICD-10-CM

## 2024-01-09 DIAGNOSIS — E785 Hyperlipidemia, unspecified: Secondary | ICD-10-CM

## 2024-01-09 DIAGNOSIS — I255 Ischemic cardiomyopathy: Secondary | ICD-10-CM

## 2024-01-09 DIAGNOSIS — I25119 Atherosclerotic heart disease of native coronary artery with unspecified angina pectoris: Secondary | ICD-10-CM

## 2024-01-09 DIAGNOSIS — I493 Ventricular premature depolarization: Secondary | ICD-10-CM | POA: Diagnosis not present

## 2024-01-09 DIAGNOSIS — I10 Essential (primary) hypertension: Secondary | ICD-10-CM | POA: Diagnosis not present

## 2024-01-09 DIAGNOSIS — R55 Syncope and collapse: Secondary | ICD-10-CM

## 2024-01-09 NOTE — Progress Notes (Signed)
 Cardiology Office Note:  .   Date:  01/13/2024  ID:  Terry Wright, DOB [birth year], MRN [id number] PCP: Almira Jaeger, MD  [facility] Providers Cardiologist:  Randene Bustard, MD     Chief Complaint  Patient presents with   Follow-up    Recent episode of micturition syncope   Coronary Artery Disease    No further Angina    Patient Profile: .     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  Cardiac History: CRFs: HTN, HLD, Pre-DM, former smoker CAD: Inferior-posterior STEMI/VT arrest 08/06/2016 --> Shock x 2 for VT - ROSC -> CATH - 100% pCx - DES PCI (Synergy DES 4.0 x 16 - 4.6 mm)   Initial EF by LV gram was 35-40% - Increased to 40 and 45% by echo 2-D Echo 01/11/2017: EF 50-55%. GR 1 DD. Mild to moderate LA dilation. Unstable Angina - Cath 03/27/2018: patent pCx stent --> Cx-OM2 95% (Culprit) -- DES PCI. ~70% dRCA trifurcation (med Rx). Abn EKG Jan 2024 -> Cath, reviewed below -> Diffuse mid-distal RCA-> CTO, fills via L-R collaterals from LAD-septals & dLCx.  Both LCx stents patent. Moderate pLDA 55% & mid LAD 45%.  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 S/p L Shoulder Arthroplasty    Oryn Casanova was last seen on July 12, 2023:  He also had a preop assessment via telehealth on 11/27/2023  ER visit for syncope possibly micturition syncope 12/20/2023. Urgent Care for acute bacterial sinusitis 12/24/2023   Subjective  Discussed the use of AI scribe software for clinical note transcription with the patient, who gave verbal consent to proceed.  History of Present Illness Terry Wright is a 68 year old male with coronary artery disease who presents with a recent episode of syncope.  He experienced a syncopal episode while urinating at night, fainting in  the bathroom. No chest pain or palpitations occurred during the episode. He was able to get up without assistance and subsequently drove himself to the emergency room.  He has a history of coronary artery disease and previously visited the ER in March for chest pain, which was relieved by nitroglycerin .   He is currently taking Norvasc  5 mg, amlodipine  5 mg, carvedilol  12.5 mg, Plavix  75 mg, Ranexa  500 mg twice a day, and Crestor  40 mg daily. No recent chest pain has been reported.  He has been diagnosed with prostate cancer, with a lesion identified on MRI. He underwent a prostate biopsy, resulting in hematuria. He is on Plavix , which may exacerbate bleeding.  He tries to stay hydrated and has not experienced any swelling or pain in his legs. He is 68 years old and acknowledges that he is nearing 3.  Family history reveals that his father was diagnosed with bladder cancer, discovered due to bleeding while on Coumadin.  Cardiovascular ROS: no chest pain or dyspnea on exertion positive for - loss of consciousness and this was that 1 episode of micturition syncope. negative for - edema, irregular heartbeat, orthopnea, palpitations, paroxysmal nocturnal dyspnea, rapid heart rate, shortness of breath, or syncope/near severe TIA/amaurosis fugax, claudication. No melena, hematochezia, or epistaxis. Hematuria noted following prostate biopsy.    Objective   He is retired Engineer, structural.  Studies Reviewed: Terry Wright        Lab Results  Component  Value Date   CHOL 100 10/09/2023   HDL 36.80 (L) 10/09/2023   LDLCALC 46 10/09/2023   LDLDIRECT 62.0 04/20/2023   TRIG 86.0 10/09/2023   CHOLHDL 3 10/09/2023   Lab Results  Component Value Date   HGBA1C 6.7 (H) 10/09/2023    Lab Results  Component Value Date   NA 138 12/20/2023   K 4.0 12/20/2023   CREATININE 0.94 12/20/2023   GFRNONAA >60 12/20/2023   GLUCOSE 180 (H) 12/20/2023   RADIOLOGY Prostate MRI: Lesion 4 on prostate  PATHOLOGY Prostate  biopsy: Adenocarcinoma of the prostate  Risk Assessment/Calculations:         Physical Exam:   VS:  BP 124/72 (BP Location: Left Arm, Patient Position: Sitting, Cuff Size: Normal)   Pulse 70   Ht 6' 1 (1.854 m)   Wt 210 lb 3.2 oz (95.3 kg)   SpO2 93%   BMI 27.73 kg/m    Wt Readings from Last 3 Encounters:  01/09/24 210 lb 3.2 oz (95.3 kg)  12/20/23 210 lb (95.3 kg)  10/06/23 211 lb 10.3 oz (96 kg)    GEN: Healthy appearing.  Well nourished, well groomed in no acute distress; looks well for stated age. NECK: No JVD; No carotid bruits CARDIAC: Normal S1, S2; RRR, no murmurs, rubs, gallops RESPIRATORY:  Clear to auscultation without rales, wheezing or rhonchi ; nonlabored, good air movement. ABDOMEN: Soft, non-tender, non-distended EXTREMITIES:  No edema; No deformity      ASSESSMENT AND PLAN: .    Problem List Items Addressed This Visit       Cardiology Problems   Coronary artery disease involving native coronary artery of native heart with angina pectoris (HCC) (Chronic)   PCI of the LCx in setting of STEMI in January 2018.  August 2019 PCI to OM 2 for unstable angina.  Distal RCA 70% trifurcation treated medically. Doing well now with no active angina symptoms on combination of amlodipine  2.5 mg daily, carvedilol  12.5 mg twice daily and Ranexa  500 mg daily -Continue current antianginal regimen -Continue rosuvastatin  40 mg daily -On maintenance Thienopyridine-Plavix  SAPT given extent of stents.  He actually did better as far as bruising with Plavix  than edema and aspirin . Okay to hold Plavix  5 to 7 days preop for surgeries or procedures.      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  Not currently on any medications. Will defer to PCP.  Potentially consider SGLT2 inhibitor although his recent micturition syncope episode would make this less favorable.      Dyslipidemia, goal LDL <55 (Chronic)   Back up to 40  mg rosuvastatin  with well-controlled lipids.  Most recent LDL was 46.  Well within goal.  With lower dose, he went back up above 55. -Continue rosuvastatin  40 mg -Labs monitored by PCP.      Essential hypertension (Chronic)   BP well-controlled on carvedilol  12.5 mg twice daily and amlodipine  2.5 mg daily.  Which shows calcium  channel blocker over ARB due to concern for possible anginal symptoms.  With recent micturition syncope episode, would avoid diuretic to avoid dehydration.      Frequent PVCs (Chronic)   Significant PVC burden noted on monitor in the past.  He is now on carvedilol  12.5 mg twice daily.  Relatively asymptomatic.      h/o STEMI involving left circumflex coronary artery (HCC) -complicated by cardiac arrest - Primary (Chronic)   Presented with cardiac arrest, found to have occluded  LCx with large dominant LCx.  Now 7-1/2 years out doing well.  EF improved back to normal range. August 2019 he had an episode of unstable angina and found to have significant stenosis in the OM 2 treated with stent and distal RCA being treated medically.        Other   Micturition syncope   Likely micturition syncope due to vagal nerve activation. Explained mechanism and preventive measures. - Advise sitting down to urinate at night to prevent syncope. - Ensure adequate hydration.  May very well be related to prostate cancer with enlarged prostate making urination more difficult.      Prostate cancer (HCC) (Chronic)   Prostate cancer diagnosed post-MRI. Biopsy led to intermittent hematuria. Explained monitoring and Plavix  management. - Monitor for hematuria and hold Plavix  if significant bleeding occurs. - Contact urologist if significant bleeding is observed.            Follow-Up: Return in about 1 year (around 01/08/2025) for Routine follow up with me, [employer].     Signed, Arleen Lacer, MD, MS Randene Bustard, M.D., M.S. Interventional  Chartered certified accountant  Pager # [phone]

## 2024-01-09 NOTE — Patient Instructions (Signed)

## 2024-01-13 ENCOUNTER — Encounter: Payer: Self-pay | Admitting: Cardiology

## 2024-01-13 DIAGNOSIS — R55 Syncope and collapse: Secondary | ICD-10-CM | POA: Insufficient documentation

## 2024-01-13 DIAGNOSIS — C61 Malignant neoplasm of prostate: Secondary | ICD-10-CM | POA: Insufficient documentation

## 2024-01-13 NOTE — Assessment & Plan Note (Signed)
 Significant PVC burden noted on monitor in the past.  He is now on carvedilol  12.5 mg twice daily.  Relatively asymptomatic.

## 2024-01-13 NOTE — Assessment & Plan Note (Signed)
 A1c 6.7 indicating pretty good control.  Not currently on any medications. Will defer to PCP.  Potentially consider SGLT2 inhibitor although his recent micturition syncope episode would make this less favorable.

## 2024-01-13 NOTE — Assessment & Plan Note (Signed)
 Prostate cancer diagnosed post-MRI. Biopsy led to intermittent hematuria. Explained monitoring and Plavix  management. - Monitor for hematuria and hold Plavix  if significant bleeding occurs. - Contact urologist if significant bleeding is observed.

## 2024-01-13 NOTE — Assessment & Plan Note (Signed)
 BP well-controlled on carvedilol  12.5 mg twice daily and amlodipine  2.5 mg daily.  Which shows calcium  channel blocker over ARB due to concern for possible anginal symptoms.  With recent micturition syncope episode, would avoid diuretic to avoid dehydration.

## 2024-01-13 NOTE — Assessment & Plan Note (Addendum)
 Likely micturition syncope due to vagal nerve activation. Explained mechanism and preventive measures. - Advise sitting down to urinate at night to prevent syncope. - Ensure adequate hydration.  May very well be related to prostate cancer with enlarged prostate making urination more difficult.

## 2024-01-13 NOTE — Assessment & Plan Note (Addendum)
 Presented with cardiac arrest, found to have occluded LCx with large dominant LCx.  Now 7-1/2 years out doing well.  EF improved back to normal range. August 2019 he had an episode of unstable angina and found to have significant stenosis in the OM 2 treated with stent and distal RCA being treated medically.

## 2024-01-13 NOTE — Assessment & Plan Note (Addendum)
 PCI of the LCx in setting of STEMI in January 2018.  August 2019 PCI to OM 2 for unstable angina.  Distal RCA 70% trifurcation treated medically. Doing well now with no active angina symptoms on combination of amlodipine  2.5 mg daily, carvedilol  12.5 mg twice daily and Ranexa  500 mg daily -Continue current antianginal regimen -Continue rosuvastatin  40 mg daily -On maintenance Thienopyridine-Plavix  SAPT given extent of stents.  He actually did better as far as bruising with Plavix  than edema and aspirin . Okay to hold Plavix  5 to 7 days preop for surgeries or procedures.

## 2024-01-13 NOTE — Assessment & Plan Note (Signed)
 Back up to 40 mg rosuvastatin  with well-controlled lipids.  Most recent LDL was 46.  Well within goal.  With lower dose, he went back up above 55. -Continue rosuvastatin  40 mg -Labs monitored by PCP.

## 2024-03-03 DIAGNOSIS — D0462 Carcinoma in situ of skin of left upper limb, including shoulder: Secondary | ICD-10-CM | POA: Diagnosis not present

## 2024-03-03 DIAGNOSIS — L565 Disseminated superficial actinic porokeratosis (DSAP): Secondary | ICD-10-CM | POA: Diagnosis not present

## 2024-04-02 ENCOUNTER — Ambulatory Visit

## 2024-04-02 VITALS — Ht 73.0 in | Wt 210.0 lb

## 2024-04-02 DIAGNOSIS — E1159 Type 2 diabetes mellitus with other circulatory complications: Secondary | ICD-10-CM

## 2024-04-02 DIAGNOSIS — Z0000 Encounter for general adult medical examination without abnormal findings: Secondary | ICD-10-CM | POA: Diagnosis not present

## 2024-04-02 NOTE — Addendum Note (Signed)
 Addended by: JAYLENE ELLOUISE DEL on: 04/02/2024 03:33 PM   Modules accepted: Level of Service

## 2024-04-02 NOTE — Progress Notes (Signed)
 Subjective:   Terry Wright is a 68 y.o. who presents for a Medicare Wellness preventive visit.  As a reminder, Annual Wellness Visits don't include a physical exam, and some assessments may be limited, especially if this visit is performed virtually. We may recommend an in-person follow-up visit with your provider if needed.  Visit Complete: Virtual I connected with  Carthel Castille Bender on 04/02/24 by a audio enabled telemedicine application and verified that I am speaking with the correct person using two identifiers.  Patient Location: Home  Provider Location: Home Office  I discussed the limitations of evaluation and management by telemedicine. The patient expressed understanding and agreed to proceed.  Vital Signs: Because this visit was a virtual/telehealth visit, some criteria may be missing or patient reported. Any vitals not documented were not able to be obtained and vitals that have been documented are patient reported.  VideoDeclined- This patient declined Librarian, academic. Therefore the visit was completed with audio only.  Persons Participating in Visit: Patient.  AWV Questionnaire: No: Patient Medicare AWV questionnaire was not completed prior to this visit.  Cardiac Risk Factors include: advanced age (>10men, >33 women);hypertension;diabetes mellitus;male gender     Objective:    Today's Vitals   04/02/24 1415  Weight: 210 lb (95.3 kg)  Height: 6' 1 (1.854 m)   Body mass index is 27.71 kg/m.     04/02/2024    2:20 PM 12/20/2023    9:15 AM 10/06/2023   12:14 PM 06/05/2023    1:05 PM 03/08/2023   10:07 AM 08/30/2022    7:23 AM 08/13/2022    9:40 PM  Advanced Directives  Does Patient Have a Medical Advance Directive? Yes No No Yes Yes No No  Type of Estate agent of [place];Living will   Healthcare Power of [place];Living will Healthcare Power of [place];Living will    Does patient want to make  changes to medical advance directive?    No - Patient declined     Copy of Healthcare Power of Attorney in Chart? No - copy requested    No - copy requested    Would patient like information on creating a medical advance directive?      No - Patient declined No - Patient declined    Current Medications (verified) Outpatient Encounter Medications as of 04/02/2024  Medication Sig   acetaminophen  (TYLENOL ) 650 MG CR tablet Take 1,300 mg by mouth every 8 (eight) hours as needed for pain.   amLODipine  (NORVASC ) 2.5 MG tablet Take 1 tablet (2.5 mg total) by mouth daily.   carvedilol  (COREG ) 12.5 MG tablet TAKE 1 TABLET BY MOUTH TWO TIMES A DAY   clopidogrel  (PLAVIX ) 75 MG tablet Take 1 tablet (75 mg total) by mouth daily. TAKE 1 TABLET BY MOUTH EVERY DAY. Per cardiology   LORazepam  (ATIVAN ) 0.5 MG tablet TAKE 1 TABLET BY MOUTH AT BEDTIME AS NEEDED FOR ANXIETY. DO not drive FOR 8 hours AFTER taking   nitroGLYCERIN  (NITROSTAT ) 0.4 MG SL tablet place 1 TABLET UNDER THE TONGUE EVERY 5 MINUTES AS NEEDED FOR CHEST PAIN UP TO 3 doses   pantoprazole  (PROTONIX ) 40 MG tablet TAKE 1 TABLET BY MOUTH EVERY DAY   ranolazine  (RANEXA ) 500 MG 12 hr tablet Take 1 tablet (500 mg total) by mouth 2 (two) times daily.   rosuvastatin  (CRESTOR ) 40 MG tablet TAKE 1 TABLET BY MOUTH EVERY DAY   triamcinolone  lotion (KENALOG ) 0.1 % Apply 1 application  topically 2 (two)  times daily as needed (skin irritation). Mix lotion with cream and apply twice daily to affected area   [DISCONTINUED] cyclobenzaprine  (FLEXERIL ) 10 MG tablet Take 1 tablet (10 mg total) by mouth 3 (three) times daily as needed for muscle spasms.   No facility-administered encounter medications on file as of 04/02/2024.    Allergies (verified) Metformin    History: Past Medical History:  Diagnosis Date   Anginal pain (HCC) 2018   Anxiety    Arthritis    shoulders hips,    CAD S/P percutaneous coronary angioplasty 07/2016   a) 07/2016: 100% pCx -- DES PCI  with 4.0 x 16 mm Synergy DES. 65% ostial OM 3 (relatively small caliber - Med Rx);; b) 03/27/18 PCI/DES to dLCX/OM3 x1. Patent pCX DES. 70% dRCA trifurcation lesion (Med Rx). Normal EF;; 07/2022: Patent Cx/LPAV Stents patent. ~mRCA 95%--> dRCA 100% (prior 90% bifurcation lesion). pLAD 55%, m-dLAD 45%. L-R Collaterals fill PAVPL & PDA.   COPD (chronic obstructive pulmonary disease) (HCC) 2018   mild   DVT (deep venous thrombosis) (HCC)    Right upper arm DVT on 2 occasions in the remote past   Dysrhythmia    V tac   Ejection fraction    LV function normal, echo, February, 2010   GERD (gastroesophageal reflux disease)    History of tobacco abuse 2015   43-pack-year history   Hyperlipidemia    Hypertension    Mitral regurgitation    Mild, echo, February, 2010   Palpitations    Mild in the past   Peripheral vascular disease (HCC) 1980s   DVT   Pre-diabetes    Statin intolerance    Felt poorly after Lipitor, Crestor , TriCor   STEMI involving left circumflex coronary artery (HCC) 08/04/2016   Occluded very large caliber codominant circumflex - PCI with single Synergy DES   Past Surgical History:  Procedure Laterality Date   CARDIAC CATHETERIZATION N/A 08/04/2016   Procedure: Left Heart Cath and Coronary Angiography;  Surgeon: Alm LELON Clay, MD;  Location: [facility] INVASIVE CV LAB;  Service: Cardiovascular;  Laterality: N/A: 100% pCx --> PCI. residual 65% pOM3 (small).  ~EF 35-45%  with lateral HK.   CARDIAC CATHETERIZATION N/A 08/04/2016   Procedure: CORONARY STENT INTERVENTION;  Surgeon: Alm LELON Clay, MD;  Location: [facility] INVASIVE CV LAB;  Service: Cardiovascular;  Laterality: N/A: pCx 100%-0%: Synergy DES 4.0 x 16 (4.6 mm)   CHONDROPLASTY Right 04/04/2017   Procedure: CHONDROPLASTY;  Surgeon: Yvone [name], MD;  Location: [facility];  Service: Orthopedics;  Laterality: Right;   CORONARY STENT INTERVENTION N/A 03/27/2018   Procedure: CORONARY STENT INTERVENTION;  Surgeon: Swaziland, Peter M, MD;   Location: [facility] INVASIVE CV LAB;  Service: Cardiovascular:: dCx-OM3 95%-0%: DES PCI - Synergy 2.25 x 16 mm.   GANGLION CYST EXCISION     oct 2017   HERNIA REPAIR     umbilical hernia 2017 oct   KNEE ARTHROSCOPY Right 04/04/2017   Procedure: ARTHROSCOPY RIGHT KNEE, WITH MEIDAL FEMORAL CONDYLE PATELLAR MEDIAL FEMORAL JOINT PLICA EXCISION ;  Surgeon: Yvone [name], MD;  Location: [facility];  Service: Orthopedics;  Laterality: Right;   LEFT HEART CATH AND CORONARY ANGIOGRAPHY N/A 03/27/2018   Procedure: LEFT HEART CATH AND CORONARY ANGIOGRAPHY;  Surgeon: Swaziland, Peter M, MD;  Location: [facility] INVASIVE CV LAB;  Service: Cardiovascular:   2 vessel obstructive CAD: - 95% dLCx/OM3 --Successful PCI of the dLCx/OM3 with SYNERGY DES 2.25X16, -70% distal RCA at trifurcation (Med Management).  Widely patent pLCx DES. Low normal  LV function (50-55%), Normal LVEDP    LEFT HEART CATH AND CORONARY ANGIOGRAPHY N/A 08/30/2022   Procedure: LEFT HEART CATH AND CORONARY ANGIOGRAPHY;  Surgeon: Anner Alm ORN, MD;  Location: [facility] INVASIVE CV LAB;  Service: CV. ::pLAD 55%, m-d LAD 45%. pRCA-dRCA 95% (SubTO) -> dRCA 100% CTO prior to Bifurcation w/ 90-95% into PDA & PAV - both fill via L-R collaterals. pCx DES & LPAV stent patent.  No AS. LVEDP 16 mmHg.   NM MYOVIEW  LTD  08/23/2022   INTERMEDIATE RISK.  EF~39%.  Infero-septal wall hypokinesis.  No ST segment changes.  Partially veritable medium sized, moderate severity defect in the mid to basal inferoseptal wall.; Considered Infarct w/ Peri-Infarct Ischemia. => Referred for Cath.   REVERSE SHOULDER ARTHROPLASTY Left 06/14/2023   Procedure: REVERSE SHOULDER ARTHROPLASTY;  Surgeon: Melita Drivers, MD;  Location: [facility];  Service: Orthopedics;  Laterality: Left;    TOTAL HIP ARTHROPLASTY Left 04/08/2020   Procedure: TOTAL HIP ARTHROPLASTY ANTERIOR APPROACH;  Surgeon: Ernie Cough, MD;  Location: [facility];  Service: Orthopedics;  Laterality: Left;  70 mins   TOTAL HIP ARTHROPLASTY  Right 05/02/2022   Procedure: TOTAL HIP ARTHROPLASTY ANTERIOR APPROACH;  Surgeon: Ernie Cough, MD;  Location: [facility];  Service: Orthopedics;  Laterality: Right;  90   TRANSTHORACIC ECHOCARDIOGRAM  08/22/2022   Normal LV Size &Fxn - EF ~ 55-65%. No RWMA. Mild Conc LVH with Severe LA dilation (but ? Normal Diastolic Fxn). Normal RV size & Fxn with normal RVP & RAP. Normal MV & AoV.   TRANSTHORACIC ECHOCARDIOGRAM  01/11/2017   a) a) 07/2016: post STEMI: EF 40-45%. Mild concentric LVH. Severe HK of basal-mid inferolateral wall consistent with infarct in this distribution. GR 1 DD.;; b) 12/2016: EF 50-55%. GR 1 DD. Mild to moderate LA dilation.   Family History  Problem Relation Age of Onset   Heart attack Mother        x2- lived into 31s   Hypertension Father        lived into 35s   Other Brother        killed in [place]   Social History   Socioeconomic History   Marital status: Married    Spouse name: Macario   Number of children: Not on file   Years of education: 12   Highest education level: High school graduate  Occupational History   Not on file  Tobacco Use   Smoking status: Former    Current packs/day: 0.00    Average packs/day: 1 pack/day for 43.0 years (43.0 ttl pk-yrs)    Types: Cigarettes    Start date: 44    Quit date: 2015    Years since quitting: 10.6   Smokeless tobacco: Never   Tobacco comments:    Former smoker quit 2015  Vaping Use   Vaping status: Never Used  Substance and Sexual Activity   Alcohol use: Yes    Alcohol/week: 7.0 standard drinks of alcohol    Types: 7 Glasses of wine per week    Comment: occasional   Drug use: No   Sexual activity: Not on file  Other Topics Concern   Not on file  Social History Narrative   Married 10 years in 2018. 2 step kids- 26 and 28. 3 kids but he does not have contact with. No grandkids      Retired Comptroller.    Wife working [employer]- vein and vascular      Hobbies: golf (needs shoulder replacement  though, working in the yard, working out- was doing this regularly even before MI- walking/treadmill   Social Drivers of Corporate investment banker Strain: Low Risk  (04/02/2024)   Overall Financial Resource Strain (CARDIA)    Difficulty of Paying Living Expenses: Not hard at all  Food Insecurity: No Food Insecurity (04/02/2024)   Hunger Vital Sign    Worried About Running Out of Food in the Last Year: Never true    Ran Out of Food in the Last Year: Never true  Transportation Needs: No Transportation Needs (04/02/2024)   PRAPARE - Administrator, Civil Service (Medical): No    Lack of Transportation (Non-Medical): No  Physical Activity: Sufficiently Active (04/02/2024)   Exercise Vital Sign    Days of Exercise per Week: 5 days    Minutes of Exercise per Session: 30 min  Stress: No Stress Concern Present (04/02/2024)   [employer] of Occupational Health - Occupational Stress Questionnaire    Feeling of Stress: Not at all  Social Connections: Moderately Integrated (04/02/2024)   Social Connection and Isolation Panel    Frequency of Communication with Friends and Family: More than three times a week    Frequency of Social Gatherings with Friends and Family: More than three times a week    Attends Religious Services: Never    Database administrator or Organizations: Yes    Attends Engineer, structural: 1 to 4 times per year    Marital Status: Married    Tobacco Counseling Counseling given: Not Answered Tobacco comments: Former smoker quit 2015    Clinical Intake:  Pre-visit preparation completed: Yes  Pain : No/denies pain     BMI - recorded: 27.71 Nutritional Status: BMI 25 -29 Overweight Nutritional Risks: None Diabetes: Yes CBG done?: No Did pt. bring in CBG monitor from home?: No  Lab Results  Component Value Date   HGBA1C 6.7 (H) 10/09/2023   HGBA1C 6.5 04/20/2023   HGBA1C 6.6 (H) 11/17/2022     How often do you need to have someone help you  when you read instructions, pamphlets, or other written materials from your doctor or pharmacy?: 1 - Never  Interpreter Needed?: No  Information entered by :: Ellouise Haws, LPN   Activities of Daily Living     04/02/2024    2:16 PM 06/05/2023    1:09 PM  In your present state of health, do you have any difficulty performing the following activities:  Hearing? 0   Vision? 0   Difficulty concentrating or making decisions? 0   Walking or climbing stairs? 0   Dressing or bathing? 0   Doing errands, shopping? 0 0  Preparing Food and eating ? N   Using the Toilet? N   In the past six months, have you accidently leaked urine? N   Do you have problems with loss of bowel control? N   Managing your Medications? N   Managing your Finances? N   Housekeeping or managing your Housekeeping? N     Patient Care Team: Katrinka Garnette KIDD, MD as PCP - General (Family Medicine) Anner Alm ORN, MD as PCP - Cardiology (Cardiology) Yvone [name], MD as Consulting Physician (Orthopedic Surgery) Luis Purchase, MD as Consulting Physician (Gastroenterology)  I have updated your Care Teams any recent Medical Services you may have received from other providers in the past year.     Assessment:   This is a routine wellness examination for [place].  Hearing/Vision screen Hearing  Screening - Comments:: Pt denies any hearing issues  Vision Screening - Comments:: Wears rx glasses - up to date with routine eye exams with Dr Octavia    Goals Addressed             This Visit's Progress    Patient Stated       Maintain health and activity        Depression Screen     04/02/2024    2:21 PM 03/08/2023   10:08 AM 11/17/2022    8:24 AM 02/23/2022    9:38 AM 08/24/2021   10:51 AM 03/15/2020    2:30 PM 09/16/2019    3:52 PM  PHQ 2/9 Scores  PHQ - 2 Score 0 0 0 0 0 0 0  PHQ- 9 Score      0     Fall Risk     04/02/2024    2:22 PM 03/01/2023   10:00 AM 11/17/2022    8:24 AM 02/23/2022    9:40 AM  04/05/2021    9:55 AM  Fall Risk   Falls in the past year? 1 0 0 0 0  Number falls in past yr: 0  0 0 0  Injury with Fall? 0  0 0 0  Risk for fall due to : Impaired balance/gait Impaired vision No Fall Risks Impaired vision No Fall Risks  Follow up Falls prevention discussed Falls prevention discussed Falls evaluation completed Falls prevention discussed  Falls evaluation completed      Data saved with a previous flowsheet row definition    MEDICARE RISK AT HOME:  Medicare Risk at Home Any stairs in or around the home?: Yes Home free of loose throw rugs in walkways, pet beds, electrical cords, etc?: Yes Adequate lighting in your home to reduce risk of falls?: Yes Life alert?: No Use of a cane, walker or w/c?: No Grab bars in the bathroom?: No Shower chair or bench in shower?: Yes Elevated toilet seat or a handicapped toilet?: No  TIMED UP AND GO:  Was the test performed?  No  Cognitive Function: 6CIT completed        04/02/2024    2:24 PM 03/08/2023   10:10 AM 02/23/2022    9:42 AM  6CIT Screen  What Year? 0 points 0 points 0 points  What month? 0 points 0 points 0 points  What time? 0 points 0 points 0 points  Count back from 20 0 points 0 points 0 points  Months in reverse 4 points 4 points 4 points  Repeat phrase 0 points 2 points 0 points  Total Score 4 points 6 points 4 points    Immunizations Immunization History  Administered Date(s) Administered   Fluad Quad(high Dose 65+) 04/05/2021, 07/05/2022   Fluad Trivalent(High Dose 65+) 04/20/2023   Influenza,inj,Quad PF,6+ Mos 04/28/2019   Influenza-Unspecified 05/17/2016, 05/02/2017, 04/15/2018, 04/28/2019, 05/07/2020   PFIZER(Purple Top)SARS-COV-2 Vaccination 08/19/2019, 09/09/2019, 05/10/2020   PNEUMOCOCCAL CONJUGATE-20 08/24/2021   Tdap 10/28/2015   Zoster Recombinant(Shingrix) 09/17/2017, 12/23/2017    Screening Tests Health Maintenance  Topic Date Due   Diabetic kidney evaluation - Urine ACR  Never done    OPHTHALMOLOGY EXAM  10/03/2023   FOOT EXAM  11/17/2023   INFLUENZA VACCINE  02/29/2024   HEMOGLOBIN A1C  04/10/2024   Lung Cancer Screening  08/06/2024   Diabetic kidney evaluation - eGFR measurement  12/19/2024   Medicare Annual Wellness (AWV)  04/02/2025   DTaP/Tdap/Td (2 - Td or Tdap) 10/27/2025   Colonoscopy  11/17/2030   Pneumococcal Vaccine: 50+ Years  Completed   Hepatitis C Screening  Completed   Zoster Vaccines- Shingrix  Completed   HPV VACCINES  Aged Out   Meningococcal B Vaccine  Aged Out   COVID-19 Vaccine  Discontinued    Health Maintenance  Health Maintenance Due  Topic Date Due   Diabetic kidney evaluation - Urine ACR  Never done   OPHTHALMOLOGY EXAM  10/03/2023   FOOT EXAM  11/17/2023   INFLUENZA VACCINE  02/29/2024   Health Maintenance Items Addressed: See Nurse Notes at the end of this note  Additional Screening:  Vision Screening: Recommended annual ophthalmology exams for early detection of glaucoma and other disorders of the eye. Would you like a referral to an eye doctor? No    Dental Screening: Recommended annual dental exams for proper oral hygiene  Community Resource Referral / Chronic Care Management: CRR required this visit?  No   CCM required this visit?  No   Plan:    I have personally reviewed and noted the following in the patient's chart:   Medical and social history Use of alcohol, tobacco or illicit drugs  Current medications and supplements including opioid prescriptions. Patient is not currently taking opioid prescriptions. Functional ability and status Nutritional status Physical activity Advanced directives List of other physicians Hospitalizations, surgeries, and ER visits in previous 12 months Vitals Screenings to include cognitive, depression, and falls Referrals and appointments  In addition, I have reviewed and discussed with patient certain preventive protocols, quality metrics, and best practice recommendations.  A written personalized care plan for preventive services as well as general preventive health recommendations were provided to patient.   Ellouise VEAR Haws, LPN   0/12/7972   After Visit Summary: (MyChart) Due to this being a telephonic visit, the after visit summary with patients personalized plan was offered to patient via MyChart   Notes: Nothing significant to report at this time. Requested last eye exam

## 2024-04-02 NOTE — Patient Instructions (Signed)
 Mr. Grape , Thank you for taking time out of your busy schedule to complete your Annual Wellness Visit with me. I enjoyed our conversation and look forward to speaking with you again next year. I, as well as your care team,  appreciate your ongoing commitment to your health goals. Please review the following plan we discussed and let me know if I can assist you in the future. Your Game plan/ To Do List    Referrals: If you haven't heard from the office you've been referred to, please reach out to them at the phone provided.   Follow up Visits: We will see or speak with you next year for your Next Medicare AWV with our clinical staff Have you seen your provider in the last 6 months (3 months if uncontrolled diabetes)? Yes  Clinician Recommendations:  Aim for 30 minutes of exercise or brisk walking, 6-8 glasses of water , and 5 servings of fruits and vegetables each day.       This is a list of the screenings recommended for you:  Health Maintenance  Topic Date Due   Yearly kidney health urinalysis for diabetes  Never done   Eye exam for diabetics  10/03/2023   Complete foot exam   11/17/2023   Flu Shot  02/29/2024   Medicare Annual Wellness Visit  03/07/2024   Hemoglobin A1C  04/10/2024   Screening for Lung Cancer  08/06/2024   Yearly kidney function blood test for diabetes  12/19/2024   DTaP/Tdap/Td vaccine (2 - Td or Tdap) 10/27/2025   Colon Cancer Screening  11/17/2030   Pneumococcal Vaccine for age over 52  Completed   Hepatitis C Screening  Completed   Zoster (Shingles) Vaccine  Completed   HPV Vaccine  Aged Out   Meningitis B Vaccine  Aged Out   COVID-19 Vaccine  Discontinued    Advanced directives: (Copy Requested) Please bring a copy of your health care power of attorney and living will to the office to be added to your chart at your convenience. You can mail to [facility] [address] [place], [place] [address] or email to  [email or web address] Advance Care Planning is important because it:  [x]  Makes sure you receive the medical care that is consistent with your values, goals, and preferences  [x]  It provides guidance to your family and loved ones and reduces their decisional burden about whether or not they are making the right decisions based on your wishes.  Follow the link provided in your after visit summary or read over the paperwork we have mailed to you to help you started getting your Advance Directives in place. If you need assistance in completing these, please reach out to us  so that we can help you!  See attachments for Preventive Care and Fall Prevention Tips.

## 2024-04-06 ENCOUNTER — Other Ambulatory Visit: Payer: Self-pay | Admitting: Family Medicine

## 2024-04-15 DIAGNOSIS — L80 Vitiligo: Secondary | ICD-10-CM | POA: Diagnosis not present

## 2024-04-15 DIAGNOSIS — D0462 Carcinoma in situ of skin of left upper limb, including shoulder: Secondary | ICD-10-CM | POA: Diagnosis not present

## 2024-04-16 ENCOUNTER — Ambulatory Visit (INDEPENDENT_AMBULATORY_CARE_PROVIDER_SITE_OTHER)

## 2024-04-16 DIAGNOSIS — Z23 Encounter for immunization: Secondary | ICD-10-CM | POA: Diagnosis not present

## 2024-05-07 ENCOUNTER — Other Ambulatory Visit: Payer: Self-pay | Admitting: Family Medicine

## 2024-05-08 ENCOUNTER — Other Ambulatory Visit: Payer: Self-pay | Admitting: Family Medicine

## 2024-06-16 ENCOUNTER — Other Ambulatory Visit: Payer: Self-pay | Admitting: Cardiology

## 2024-06-16 DIAGNOSIS — Z96612 Presence of left artificial shoulder joint: Secondary | ICD-10-CM | POA: Diagnosis not present

## 2024-06-18 ENCOUNTER — Other Ambulatory Visit: Payer: Self-pay | Admitting: Cardiology

## 2024-06-20 MED ORDER — AMLODIPINE BESYLATE 2.5 MG PO TABS: 2.5000 mg | ORAL_TABLET | Freq: Every day | ORAL | 2 refills | Status: AC

## 2024-07-08 DIAGNOSIS — N401 Enlarged prostate with lower urinary tract symptoms: Secondary | ICD-10-CM | POA: Diagnosis not present

## 2024-07-08 DIAGNOSIS — C61 Malignant neoplasm of prostate: Secondary | ICD-10-CM | POA: Diagnosis not present

## 2024-07-08 DIAGNOSIS — R351 Nocturia: Secondary | ICD-10-CM | POA: Diagnosis not present

## 2024-07-08 DIAGNOSIS — R3914 Feeling of incomplete bladder emptying: Secondary | ICD-10-CM | POA: Diagnosis not present

## 2024-07-08 DIAGNOSIS — R3912 Poor urinary stream: Secondary | ICD-10-CM | POA: Diagnosis not present

## 2024-07-09 ENCOUNTER — Other Ambulatory Visit: Payer: Self-pay | Admitting: Urology

## 2024-07-09 DIAGNOSIS — C61 Malignant neoplasm of prostate: Secondary | ICD-10-CM

## 2024-07-23 ENCOUNTER — Other Ambulatory Visit (HOSPITAL_COMMUNITY): Payer: Self-pay | Admitting: Urology

## 2024-07-23 DIAGNOSIS — C61 Malignant neoplasm of prostate: Secondary | ICD-10-CM

## 2024-08-06 ENCOUNTER — Ambulatory Visit (HOSPITAL_COMMUNITY)
Admission: RE | Admit: 2024-08-06 | Discharge: 2024-08-06 | Disposition: A | Discharge status: Home / Self Care | Source: Ambulatory Visit | Attending: Urology | Admitting: Urology

## 2024-08-06 DIAGNOSIS — C61 Malignant neoplasm of prostate: Secondary | ICD-10-CM | POA: Insufficient documentation

## 2024-08-06 MED ORDER — GADOBUTROL 1 MMOL/ML IV SOLN
9.0000 mL | Freq: Once | INTRAVENOUS | Status: AC | PRN
  Administered 2024-08-06: 9 mL via INTRAVENOUS

## 2024-08-07 ENCOUNTER — Ambulatory Visit (HOSPITAL_BASED_OUTPATIENT_CLINIC_OR_DEPARTMENT_OTHER)
Admission: RE | Admit: 2024-08-07 | Discharge: 2024-08-07 | Disposition: A | Discharge status: Home / Self Care | Source: Ambulatory Visit | Attending: Acute Care | Admitting: Acute Care

## 2024-08-07 DIAGNOSIS — Z87891 Personal history of nicotine dependence: Secondary | ICD-10-CM | POA: Insufficient documentation

## 2024-08-07 DIAGNOSIS — Z122 Encounter for screening for malignant neoplasm of respiratory organs: Secondary | ICD-10-CM | POA: Diagnosis present

## 2024-08-15 ENCOUNTER — Telehealth: Payer: Self-pay

## 2024-08-15 NOTE — Telephone Encounter (Signed)
 Patient scheduled for pre-op clearance on 08/26/24 with Glendia Ferrier, PA-C.     Patient Consent for Virtual Visit        Terry Wright has provided verbal consent on 08/15/2024 for a virtual visit (video or telephone).   CONSENT FOR VIRTUAL VISIT FOR:  Terry Wright  By participating in this virtual visit I agree to the following:  I hereby voluntarily request, consent and authorize [facility] and its employed or contracted physicians, physician assistants, nurse practitioners or other licensed health care professionals (the Practitioner), to provide me with telemedicine health care services (the Services) as deemed necessary by the treating Practitioner. I acknowledge and consent to receive the Services by the Practitioner via telemedicine. I understand that the telemedicine visit will involve communicating with the Practitioner through live audiovisual communication technology and the disclosure of certain medical information by electronic transmission. I acknowledge that I have been given the opportunity to request an in-person assessment or other available alternative prior to the telemedicine visit and am voluntarily participating in the telemedicine visit.  I understand that I have the right to withhold or withdraw my consent to the use of telemedicine in the course of my care at any time, without affecting my right to future care or treatment, and that the Practitioner or I may terminate the telemedicine visit at any time. I understand that I have the right to inspect all information obtained and/or recorded in the course of the telemedicine visit and may receive copies of available information for a reasonable fee.  I understand that some of the potential risks of receiving the Services via telemedicine include:  Delay or interruption in medical evaluation due to technological equipment failure or disruption; Information transmitted may not be sufficient (e.g.  poor resolution of images) to allow for appropriate medical decision making by the Practitioner; and/or  In rare instances, security protocols could fail, causing a breach of personal health information.  Furthermore, I acknowledge that it is my responsibility to provide information about my medical history, conditions and care that is complete and accurate to the best of my ability. I acknowledge that Practitioner's advice, recommendations, and/or decision may be based on factors not within their control, such as incomplete or inaccurate data provided by me or distortions of diagnostic images or specimens that may result from electronic transmissions. I understand that the practice of medicine is not an exact science and that Practitioner makes no warranties or guarantees regarding treatment outcomes. I acknowledge that a copy of this consent can be made available to me via my patient portal [facility] MyChart), or I can request a printed copy by calling the office of [facility].    I understand that my insurance will be billed for this visit.   I have read or had this consent read to me. I understand the contents of this consent, which adequately explains the benefits and risks of the Services being provided via telemedicine.  I have been provided ample opportunity to ask questions regarding this consent and the Services and have had my questions answered to my satisfaction. I give my informed consent for the services to be provided through the use of telemedicine in my medical care

## 2024-08-15 NOTE — Telephone Encounter (Signed)
"  ° °  Pre-operative Risk Assessment    Patient Name: Terry Wright  DOB: Feb 02, 1956 MRN: [id number]   Date of last office visit: 01/09/2024, Dr. Alm Clay, MD Date of next office visit: NONE   Request for Surgical Clearance    Procedure:  MRI Fusion Biopsy   Date of Surgery:  Clearance TBD                                Surgeon: Dr. Donnice Siad, MD Surgeon's Group or Practice Name: [facility] Phone number: [phone] Fax number: [phone]   Type of Clearance Requested:   - Medical  - Pharmacy:  Hold Clopidogrel  (Plavix ) 5 days   Type of Anesthesia:  Not Indicated   Additional requests/questions:    Terry Wright   08/15/2024, 11:52 AM   "

## 2024-08-15 NOTE — Telephone Encounter (Signed)
" ° °  Name: Terry Wright  DOB: [birth year]  MRN: [id number]  Primary Cardiologist: Alm Clay, MD   Preoperative team, please contact this patient and set up a phone call appointment for further preoperative risk assessment. Please obtain consent and complete medication review. Thank you for your help.  I confirm that guidance regarding antiplatelet and oral anticoagulation therapy has been completed and, if necessary, noted below: - Per Dr. Clay: Genna to hold Plavix  5 to 7 days preop for surgeries or procedures.   I also confirmed the patient resides in the state of [place] . As per [facility] Medical Board telemedicine laws, the patient must reside in the state in which the provider is licensed.   Mamie Hundertmark E Rhyan Radler, PA-C 08/15/2024, 2:13 PM [facility]    "

## 2024-08-18 ENCOUNTER — Other Ambulatory Visit: Payer: Self-pay

## 2024-08-18 ENCOUNTER — Other Ambulatory Visit: Payer: Self-pay | Admitting: Cardiology

## 2024-08-18 DIAGNOSIS — Z122 Encounter for screening for malignant neoplasm of respiratory organs: Secondary | ICD-10-CM

## 2024-08-18 DIAGNOSIS — Z87891 Personal history of nicotine dependence: Secondary | ICD-10-CM

## 2024-08-19 ENCOUNTER — Other Ambulatory Visit: Payer: Self-pay | Admitting: Cardiology

## 2024-08-21 NOTE — Telephone Encounter (Signed)
 Labs outside of Normal on 12/20/23  In accordance with refill protocols, please review and address the following requirements before this medication refill can be authorized:  Labs

## 2024-08-22 ENCOUNTER — Ambulatory Visit: Admitting: Podiatry

## 2024-08-22 ENCOUNTER — Ambulatory Visit

## 2024-08-22 ENCOUNTER — Encounter: Payer: Self-pay | Admitting: Podiatry

## 2024-08-22 DIAGNOSIS — M722 Plantar fascial fibromatosis: Secondary | ICD-10-CM | POA: Diagnosis not present

## 2024-08-22 DIAGNOSIS — M7751 Other enthesopathy of right foot: Secondary | ICD-10-CM

## 2024-08-22 DIAGNOSIS — M62461 Contracture of muscle, right lower leg: Secondary | ICD-10-CM

## 2024-08-22 MED ORDER — BETAMETHASONE SOD PHOS & ACET 6 (3-3) MG/ML IJ SUSP
6.0000 mg | Freq: Once | INTRAMUSCULAR | Status: AC
  Administered 2024-08-22: 6 mg

## 2024-08-22 NOTE — Patient Instructions (Signed)

## 2024-08-22 NOTE — Progress Notes (Unsigned)
 "  Subjective:  Patient ID: Terry Wright, male    DOB: [birth year],  MRN: [id number]  Chief Complaint  Patient presents with   Foot Pain    R foot pain Starts in heel extends up into sub met 1-5 x 4 months. No incident. Wearing good shoes.  Diabetic A1c 6.05 October 2023. Plavix     Discussed the use of AI scribe software for clinical note transcription with the patient, who gave verbal consent to proceed.  History of Present Illness Terry Wright is a 69 year old male with type 2 diabetes who presents with chronic right plantar heel pain.  For 3-4 months he has had daily right plantar medial heel pain at the plantar fascia origin. Baseline pain is 5-8/10 and increases to 8/10 with walking at the gym. Pain is worsened by ambulation, stretching, stair climbing, and at night with sleep interruption. Today the pain is milder.  Pain can radiate into the foot, especially with stretching, and he feels as if it might snap when he stretches. He denies numbness in the feet and low back pain.  He has tried shoe inserts, stretching, and massage with a tennis ball and similar devices. He consistently wears Hoka shoes and recovery Crocs at home and avoids going barefoot. Loose flip flops were less helpful. He has not had prior medical evaluation for this episode. He had a similar plantar fasciitis episode about 20 years ago that resolved.  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      Objective:    Physical Exam MUSCULOSKELETAL: Tenderness on palpation of the right foot plantar fascia plantar medial heel. No tenderness on palpation of the central heel. No tenderness on heel squeeze test. Mild tightness in the Achilles tendon without tenderness on palpation. No gaps in the plantar fascia.  Ankle joint dorsiflexion less than 10 degrees with knees extended DP and PT pulses palpable 2/4 bilaterally.  Cap  refill intact. Gross light touch sensation intact Pedal skin well-hydrated within normal limits for skin tissue and skin turgor.   No images are attached to the encounter.    Results Radiology Right foot X-ray (08/22/2024): Calcaneal spur at plantar fascia origin; otherwise normal osseous mineralization, no acute fractures. Joint spaces preserved. (Independently interpreted)   Assessment:   1. Plantar fasciitis of right foot   2. Gastrocnemius equinus, right      Plan:  Patient was evaluated and treated and all questions answered.  Assessment and Plan Assessment & Plan Plantar fasciitis, right foot with gastrocnemius equinus Chronic right plantar fasciitis with daily pain, confirmed by imaging and physical exam. Steroid injection chosen due to contraindications for oral NSAIDs. Conservative management emphasized. - Administered steroid injection to right plantar fascia with cold spray anesthesia. - Provided stretching exercises for Achilles and plantar fascia, to be performed 2-3 times daily. - Recommended brace for offloading during ambulation; arranged fitting today.This is an ankle gauntlet device with offloading elastic strap to offload the plantar fascia and longitudinal medial arch.  Documentation on file. - Advised consideration of night splint for nocturnal dorsiflexion if symptoms persist. - Encouraged continued use of rigid arch support inserts and appropriate footwear; advised against loose flip-flops and recommended shoes with heel strap. - Suggested ice massage (e.g., frozen water  bottle) and strengthening exercises (towel scrunches, marble pickups). - Discussed conditional escalation to walking boot or physical therapy if no improvement at follow-up given chronicity of symptoms. -  Scheduled follow-up in 2-3 weeks to reassess response.  Procedure: Injection Tendon/Ligament Discussed alternatives, risks, complications and verbal consent was obtained.  Location: Right  plantar fascia medial approach via glabrous junction. Skin Prep: Alcohol. Injectate: 1cc 0.5% marcaine  plain, 1 cc betamethasone  Soluspan Disposition: Patient tolerated procedure well. Injection site dressed with a band-aid.  Post-injection care was discussed and return precautions discussed.        Return in about 2 weeks (around 09/05/2024) for Plantar Fasciitis.    "

## 2024-08-25 NOTE — Progress Notes (Unsigned)
"  ° °  Virtual Visit via Telephone Note   Because of Terry Wright co-morbid illnesses, he is at least at moderate risk for complications without adequate follow up.  This format is felt to be most appropriate for this patient at this time.  Due to technical limitations with video connection web designer), today's appointment will be conducted as an audio only telehealth visit, and Terry Wright verbally agreed to proceed in this manner.   All issues noted in this document were discussed and addressed.  No physical exam could be performed with this format.  Evaluation Performed:  Preoperative cardiovascular risk assessment _____________   Date:  08/26/2024   Patient ID:  Terry Wright, DOB 18-Jun-1956, MRN [id number]  Patient Location:  Provider location:  Home Office   Primary Care Provider:  Katrinka Garnette KIDD, MD Primary Cardiologist:  Alm Clay, MD  Patient Profile  Coronary artery disease  Inf STEMI 07/2016 c/b cardiac arrest s/p 4 x 16 mm DES to LCx  [place]  02/2018 s/p 2.25 x 16 mm DES to LCx MPI 08/21/22: inf-sept infarct w peri infarct ischemia, EF 39, int risk LHC 08/30/22: LCx stent patent, LPAV stent patent; pLAD 55, mLAD 45; pRCA 95, dRCA 100, 90, RPAV 95, L-R collaterals >> Med Rx unless refractory angina (would need CTO PCI) Ischemic CM EF 35-40 >>improved to 50-55 TTE 01/11/17: mild Lat HK, EF 50-55, Gr 1 DD, mild to mod LAE, mild RAE TTE 08/22/22: EF 55-60, no RWMA, mild LVH, NL RVSF, severe LAE, trivial MR PVCs  Monitor 08/2022: 11.3% Syncope  Post micturition  Hyperlipidemia  Hypertension  Pre-diabetes  Aortic atherosclerosis Chronic Obstructive Pulmonary Disease/Emphysema 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 male who presents via audio/video conferencing for a telehealth visit today.   Pt was last seen in cardiology clinic on 01/09/24 by Dr. Clay.  At that time Terry Wright  was doing well.    The patient is now pending MRI Fusion Biopsy on TBD under unknown anesthesia.  Since his last visit, he has done well w/o chest pain, shortness of breath, syncope, edema.    Physical Exam  Vital Signs:  Terry Wright does not have vital signs available for review today. Given telephonic nature of communication, physical exam is limited. AAOx3. NAD. Normal affect.  Speech and respirations are unlabored.  Assessment & Plan   Assessment & Plan Preoperative cardiovascular examination [name] perioperative risk of a major cardiac event is low at 0.9% according to the Revised Cardiac Risk Index (RCRI).  His functional capacity is good at 4.31 METs according to the Duke Activity Status Index (DASI). Recommendations: According to ACC/AHA guidelines, no further cardiovascular testing needed.  The patient may proceed to surgery at acceptable risk.   Antiplatelet and/or Anticoagulation Recommendations: Clopidogrel  (Plavix ) can be held for 5 days prior to his surgery and resumed as soon as possible post op.   A copy of this note will be routed to requesting surgeon.  Time:   Today, I have spent 8 minutes with the patient with telehealth technology discussing medical history, symptoms, and management plan.     Glendia Ferrier, PA-C 08/26/2024, 2:00 PM  "

## 2024-08-26 ENCOUNTER — Ambulatory Visit: Attending: Cardiology | Admitting: Physician Assistant

## 2024-08-26 DIAGNOSIS — Z01810 Encounter for preprocedural cardiovascular examination: Secondary | ICD-10-CM | POA: Diagnosis not present

## 2024-08-26 NOTE — Telephone Encounter (Signed)
 Notes faxed to surgeon.  Glendia Ferrier, PA-C  08/26/2024 2:01 PM

## 2024-08-26 NOTE — Addendum Note (Signed)
 Addended byBETHA FERRIER, GLENDIA T on: 08/26/2024 02:00 PM   Modules accepted: Orders

## 2024-08-28 ENCOUNTER — Telehealth: Payer: Self-pay | Admitting: Podiatry

## 2024-08-28 NOTE — Telephone Encounter (Signed)
 Tried calling patient MOM o get him scheduled for Nail ck. Had originally offered 2/6 at 10:45. At check out.This was unable to be added.

## 2024-08-29 NOTE — Telephone Encounter (Signed)
 [facility] calling to give a different fax, they haven't received anything.   [phone]

## 2024-08-29 NOTE — Telephone Encounter (Signed)
 Clearance note faxed to alternate number as provided.

## 2025-01-12 ENCOUNTER — Ambulatory Visit: Admitting: Cardiology

## 2025-04-07 ENCOUNTER — Ambulatory Visit
